# Patient Record
Sex: Female | Born: 1940 | Race: White | Hispanic: No | Smoking: Never smoker
Health system: Southern US, Community
[De-identification: ages and names within clinical notes are randomized; demographics above are authoritative.]

## PROBLEM LIST (undated history)

## (undated) DIAGNOSIS — M1990 Unspecified osteoarthritis, unspecified site: Secondary | ICD-10-CM

## (undated) DIAGNOSIS — E785 Hyperlipidemia, unspecified: Secondary | ICD-10-CM

## (undated) DIAGNOSIS — M5412 Radiculopathy, cervical region: Secondary | ICD-10-CM

## (undated) DIAGNOSIS — S329XXA Fracture of unspecified parts of lumbosacral spine and pelvis, initial encounter for closed fracture: Secondary | ICD-10-CM

## (undated) DIAGNOSIS — M8580 Other specified disorders of bone density and structure, unspecified site: Secondary | ICD-10-CM

## (undated) DIAGNOSIS — H409 Unspecified glaucoma: Secondary | ICD-10-CM

## (undated) DIAGNOSIS — R195 Other fecal abnormalities: Secondary | ICD-10-CM

## (undated) DIAGNOSIS — G20 Parkinson's disease: Secondary | ICD-10-CM

## (undated) DIAGNOSIS — E559 Vitamin D deficiency, unspecified: Secondary | ICD-10-CM

## (undated) DIAGNOSIS — R7303 Prediabetes: Secondary | ICD-10-CM

## (undated) DIAGNOSIS — Z5189 Encounter for other specified aftercare: Secondary | ICD-10-CM

## (undated) DIAGNOSIS — D649 Anemia, unspecified: Secondary | ICD-10-CM

## (undated) DIAGNOSIS — S5400XA Injury of ulnar nerve at forearm level, unspecified arm, initial encounter: Secondary | ICD-10-CM

## (undated) DIAGNOSIS — H269 Unspecified cataract: Secondary | ICD-10-CM

## (undated) DIAGNOSIS — G20A1 Parkinson's disease without dyskinesia, without mention of fluctuations: Secondary | ICD-10-CM

## (undated) DIAGNOSIS — I10 Essential (primary) hypertension: Secondary | ICD-10-CM

## (undated) DIAGNOSIS — T7840XA Allergy, unspecified, initial encounter: Secondary | ICD-10-CM

## (undated) HISTORY — DX: Unspecified cataract: H26.9

## (undated) HISTORY — DX: Hyperlipidemia, unspecified: E78.5

## (undated) HISTORY — DX: Vitamin D deficiency, unspecified: E55.9

## (undated) HISTORY — DX: Allergy, unspecified, initial encounter: T78.40XA

## (undated) HISTORY — DX: Prediabetes: R73.03

## (undated) HISTORY — DX: Encounter for other specified aftercare: Z51.89

## (undated) HISTORY — PX: DILATION AND CURETTAGE OF UTERUS: SHX78

## (undated) HISTORY — DX: Other specified disorders of bone density and structure, unspecified site: M85.80

## (undated) HISTORY — PX: TONSILLECTOMY: SUR1361

## (undated) HISTORY — DX: Other fecal abnormalities: R19.5

## (undated) HISTORY — DX: Radiculopathy, cervical region: M54.12

## (undated) HISTORY — DX: Unspecified glaucoma: H40.9

## (undated) HISTORY — PX: COLONOSCOPY: SHX174

---

## 1999-07-28 ENCOUNTER — Encounter: Admission: RE | Admit: 1999-07-28 | Discharge: 1999-07-28 | Payer: Self-pay | Admitting: Obstetrics and Gynecology

## 1999-07-28 ENCOUNTER — Encounter: Payer: Self-pay | Admitting: Obstetrics and Gynecology

## 1999-09-05 ENCOUNTER — Other Ambulatory Visit: Admission: RE | Admit: 1999-09-05 | Discharge: 1999-09-05 | Payer: Self-pay | Admitting: Obstetrics and Gynecology

## 2000-07-29 ENCOUNTER — Encounter: Payer: Self-pay | Admitting: Obstetrics and Gynecology

## 2000-07-29 ENCOUNTER — Encounter: Admission: RE | Admit: 2000-07-29 | Discharge: 2000-07-29 | Payer: Self-pay | Admitting: Obstetrics and Gynecology

## 2000-12-27 ENCOUNTER — Other Ambulatory Visit: Admission: RE | Admit: 2000-12-27 | Discharge: 2000-12-27 | Payer: Self-pay | Admitting: Obstetrics and Gynecology

## 2001-08-05 ENCOUNTER — Encounter: Admission: RE | Admit: 2001-08-05 | Discharge: 2001-08-05 | Payer: Self-pay | Admitting: Obstetrics and Gynecology

## 2001-08-05 ENCOUNTER — Encounter: Payer: Self-pay | Admitting: Obstetrics and Gynecology

## 2001-12-28 ENCOUNTER — Other Ambulatory Visit: Admission: RE | Admit: 2001-12-28 | Discharge: 2001-12-28 | Payer: Self-pay | Admitting: Obstetrics and Gynecology

## 2002-01-05 ENCOUNTER — Encounter: Admission: RE | Admit: 2002-01-05 | Discharge: 2002-01-05 | Payer: Self-pay | Admitting: Obstetrics and Gynecology

## 2002-01-05 ENCOUNTER — Encounter: Payer: Self-pay | Admitting: Obstetrics and Gynecology

## 2002-02-28 DIAGNOSIS — S5400XA Injury of ulnar nerve at forearm level, unspecified arm, initial encounter: Secondary | ICD-10-CM

## 2002-02-28 HISTORY — DX: Injury of ulnar nerve at forearm level, unspecified arm, initial encounter: S54.00XA

## 2002-02-28 HISTORY — PX: OTHER SURGICAL HISTORY: SHX169

## 2002-03-26 DIAGNOSIS — S329XXA Fracture of unspecified parts of lumbosacral spine and pelvis, initial encounter for closed fracture: Secondary | ICD-10-CM

## 2002-03-26 HISTORY — DX: Fracture of unspecified parts of lumbosacral spine and pelvis, initial encounter for closed fracture: S32.9XXA

## 2002-03-26 HISTORY — PX: OTHER SURGICAL HISTORY: SHX169

## 2002-08-07 ENCOUNTER — Encounter: Admission: RE | Admit: 2002-08-07 | Discharge: 2002-08-07 | Payer: Self-pay | Admitting: Internal Medicine

## 2002-08-07 ENCOUNTER — Encounter: Payer: Self-pay | Admitting: Internal Medicine

## 2002-08-31 HISTORY — PX: OTHER SURGICAL HISTORY: SHX169

## 2003-08-13 ENCOUNTER — Encounter: Admission: RE | Admit: 2003-08-13 | Discharge: 2003-08-13 | Payer: Self-pay | Admitting: Internal Medicine

## 2004-10-10 ENCOUNTER — Encounter: Admission: RE | Admit: 2004-10-10 | Discharge: 2004-10-10 | Payer: Self-pay | Admitting: Obstetrics and Gynecology

## 2004-11-17 ENCOUNTER — Encounter: Admission: RE | Admit: 2004-11-17 | Discharge: 2004-11-17 | Payer: Self-pay | Admitting: Internal Medicine

## 2005-11-19 ENCOUNTER — Encounter: Admission: RE | Admit: 2005-11-19 | Discharge: 2005-11-19 | Payer: Self-pay | Admitting: Internal Medicine

## 2006-07-16 ENCOUNTER — Encounter: Admission: RE | Admit: 2006-07-16 | Discharge: 2006-07-16 | Payer: Self-pay | Admitting: Internal Medicine

## 2006-08-31 HISTORY — PX: OTHER SURGICAL HISTORY: SHX169

## 2006-10-05 ENCOUNTER — Inpatient Hospital Stay (HOSPITAL_COMMUNITY): Admission: RE | Admit: 2006-10-05 | Discharge: 2006-10-07 | Payer: Self-pay | Admitting: Orthopedic Surgery

## 2006-12-06 ENCOUNTER — Encounter: Admission: RE | Admit: 2006-12-06 | Discharge: 2006-12-06 | Payer: Self-pay | Admitting: Internal Medicine

## 2007-07-25 ENCOUNTER — Ambulatory Visit: Payer: Self-pay | Admitting: Internal Medicine

## 2007-08-08 ENCOUNTER — Ambulatory Visit: Payer: Self-pay | Admitting: Internal Medicine

## 2007-12-07 ENCOUNTER — Encounter: Admission: RE | Admit: 2007-12-07 | Discharge: 2007-12-07 | Payer: Self-pay | Admitting: Internal Medicine

## 2008-05-17 ENCOUNTER — Encounter: Admission: RE | Admit: 2008-05-17 | Discharge: 2008-05-17 | Payer: Self-pay | Admitting: Orthopedic Surgery

## 2008-06-20 ENCOUNTER — Encounter: Admission: RE | Admit: 2008-06-20 | Discharge: 2008-06-20 | Payer: Self-pay | Admitting: Orthopedic Surgery

## 2008-12-10 ENCOUNTER — Encounter: Admission: RE | Admit: 2008-12-10 | Discharge: 2008-12-10 | Payer: Self-pay | Admitting: Obstetrics and Gynecology

## 2009-07-18 ENCOUNTER — Ambulatory Visit (HOSPITAL_COMMUNITY): Admission: RE | Admit: 2009-07-18 | Discharge: 2009-07-18 | Payer: Self-pay | Admitting: Internal Medicine

## 2010-01-22 ENCOUNTER — Encounter: Admission: RE | Admit: 2010-01-22 | Discharge: 2010-01-22 | Payer: Self-pay | Admitting: Obstetrics and Gynecology

## 2010-09-22 ENCOUNTER — Encounter: Payer: Self-pay | Admitting: Orthopedic Surgery
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
## 2011-01-16 NOTE — Op Note (Signed)
NAME:  Gwendolyn [name], Gwendolyn [name]           ACCOUNT NO.:  [id number]   MEDICAL RECORD NO.:  [id number]          PATIENT TYPE:  INP   LOCATION:  0002                         FACILITY:  [facility]   PHYSICIAN:  Madlyn Frankel. Charlann Boxer, M.D.  DATE OF BIRTH:  Mar 08, 1941   DATE OF PROCEDURE:  10/05/2006  DATE OF DISCHARGE:                               OPERATIVE REPORT   PREOPERATIVE DIAGNOSIS:  End-stage right hip osteoarthritis.   POSTOPERATIVE DIAGNOSIS FINDINGS:  Right hip osteoarthritis significant  involving medial aspect of joint with significant medial wall osteophyte  as well as eburnated bone on the superior lateral aspect of the joint  surface.   PROCEDURE:  Right total hip replacement.   COMPONENTS USED:  1. DePuy hip system with a size 50 pinnacle cup.  2. Two cancellous bone screws with 36 metal-on-metal liner.  3. A Tri-Lock standard stem, size 8.8 with a 36+1.5 ball.   SURGEON:  Charlann Boxer.   ASSISTANT:  Dwyane Luo, P.A.C.   ANESTHESIA:  Spinal plus MAC.   DRAINS:  None.   COMPLICATIONS:  None.   BLOOD LOSS:  400 mL.   INDICATIONS FOR PROCEDURE:  Gwendolyn [name] is a very pleasant 70-year-  old female who presented to the office for evaluation of right hip pain.  She had a significant amount of discomfort in the anterior aspect of her  thigh with ambulation and prolonged standing.  She was having a  decreased quality of life due to the discomfort she was experiencing.  She was unable to continue tolerating non-operative measures and thus  wished to discuss surgical intervention.  We reviewed the risks of  infection, dislocation, component failure, need for revision,  DVT, as  well as a discussion of bearing surfaces consent was obtained for  surgical procedure.   PROCEDURE IN DETAIL:  The patient was brought to the operative theater.  Once adequate anesthesia and preoperative antibiotics, 1 g of Ancef were  administered.  The patient was administered in the left lateral  decubitus  position with the right side up.   A lateral base incision was made for posterior approach to the hip.  The  iliotibial band and gluteus fascia was incised for posterior approach.  Short external rotators were identified and taken down separately from  the posterior capsule.  Then L-capsulotomy was created and posterior  leaflet saved for later anatomical repair as well as protection against  retractors for the sciatic nerve.   The hip was dislocated.  A neck osteotomy was made based off anatomic  landmarks.   Anatomically, her hip center appeared to be level with the tip of the  trochanter and thus the neck guide used for that position.   The femoral head was noted to have eburnated bone along the inferior  medial and superior lateral aspects, other than some flattening and  osteophyte formation.  The medial wall of the acetabulum appeared to be  significantly osteophytic, very little opening from the foveal tissue.   At this point attention was first directed to the femur.  Femoral  exposure was obtained routinely using the box osteotome.  I set my  anteversion of the femur about 20-25 degrees.  I hand reamed the canal  once and irrigated to prevent fat emboli.  I then began broaching with a  6.6 broach and then carried this all the way up to an 8.8 with good  initial fixation.  At this point, I went ahead and took the broach out  and packed the femur off with a sponge and tended to the acetabulum.  Acetabular exposed routinely, labrectomy carried out.  Reaming commenced  with a 43 reamer down to the medial wall, given a significant medial  wall osteophyte.  I then reamed up, with the curved reamers, to a 49  reamer with good bony bed preparation.  I chose a 50 mm cup.  Given the  shallow nature of her acetabulum, I felt that a pinnacle cup would be  better than an ASR cup, which did provide [place] with metal-on-metal bearing  surface.  The cup was then impacted and it was impacted at  about 40  degrees of abduction and 20 degrees of forward flexion.  Anatomically,  the anterior portion of the cup was beneath the anterior rim of the  pelvis and posteriorly the posterior rim was level with the ischium.  I  did feel this was in anatomic position.  Two cancellous screws were  placed with excellent bite securing the fixation further.  A trial liner  was then placed.   I then placed a trial stem, trial reduction was carried out.  Please  note that initially I placed a 7.7 trial.  I went ahead and impacted a 7-  7 broach a little bit further and was able to ream this down a couple  millimeters.   I then trialed with a 7.7 and actually was able to get the hip reduced.  I felt that there was a little bit of concern about it being too loose  with a 1.5 ball trial with a +5 ball.  The hip stability was excellent  with neutral abduction and hip flexion to 80 degrees and internal  rotation to 70-80 degrees as well as with adduction to 30 degrees and  internal rotation.  Compared to the down leg, the leg lengths appeared  to be very equal.  At this point, I dislocated the hip, attended to the  femur again and found that there was a little bit of torsional movement  with the 7.7, so I decided to go up to the 8.8.  The 8.8 broach was  carried down to just above the neck cut, which was okay with me given  the fact that I had used the +5 ball and trial.  At this point, the  trials were all removed.  The final 56 neutral metal liner was impacted  into the 50 mm cup.   At this point I went ahead and placed the 8.8 standard Tri-Lock stem to  the neck.  I had excellent torsional control. I went ahead and trialed  the +1.5 and felt that the hip was very stable and was similar to the  trial with the 7.7 and a +5 ball.   The femoral 36+1.5 mm ball was then impacted on to  a clean and dry trunnion and the hip reduced.  The hip was irrigated  throughout the  case, and again at this point.   I then reapproximated the posterior  capsular leaflet to the superior leaflet using #1 Ethibond.  The  iliotibial band was reapproximated using #1 Ethibond and #  1 Vicryl was  used on the gluteus fascia.  The remainder of the wound was closed in  layers with #2-0 Vicryl and a running #4-0 Monocryl.   The hip was cleaned, dried and dressed sterilely, following #4-0  Monocryl closure with Steri-Strips, dressings, sponges, tape.  The  patient was brought to the recovery room in stable condition.      Madlyn Frankel Charlann Boxer, M.D.  Electronically Signed     MDO/MEDQ  D:  10/05/2006  T:  10/05/2006  Job:  [id number]

## 2011-02-04 ENCOUNTER — Other Ambulatory Visit: Payer: Self-pay | Admitting: Obstetrics and Gynecology

## 2011-02-04 DIAGNOSIS — Z1231 Encounter for screening mammogram for malignant neoplasm of breast: Secondary | ICD-10-CM

## 2011-02-16 ENCOUNTER — Ambulatory Visit: Payer: Self-pay

## 2011-02-23 ENCOUNTER — Ambulatory Visit
Admission: RE | Admit: 2011-02-23 | Discharge: 2011-02-23 | Disposition: A | Discharge status: Home / Self Care | Payer: BC Managed Care – PPO | Source: Ambulatory Visit | Attending: Obstetrics and Gynecology | Admitting: Obstetrics and Gynecology

## 2011-02-23 DIAGNOSIS — Z1231 Encounter for screening mammogram for malignant neoplasm of breast: Secondary | ICD-10-CM

## 2011-05-02 HISTORY — PX: OTHER SURGICAL HISTORY: SHX169

## 2011-05-21 ENCOUNTER — Other Ambulatory Visit: Payer: Self-pay | Admitting: Orthopedic Surgery

## 2011-05-21 ENCOUNTER — Encounter (HOSPITAL_COMMUNITY): Payer: BC Managed Care – PPO

## 2011-05-21 LAB — COMPREHENSIVE METABOLIC PANEL
ALT: 32 U/L (ref 0–35)
AST: 33 U/L (ref 0–37)
Albumin: 3.7 g/dL (ref 3.5–5.2)
Alkaline Phosphatase: 100 U/L (ref 39–117)
BUN: 33 mg/dL — ABNORMAL HIGH (ref 6–23)
CO2: 30 mEq/L (ref 19–32)
Calcium: 9.5 mg/dL (ref 8.4–10.5)
Chloride: 97 mEq/L (ref 96–112)
Creatinine, Ser: 0.78 mg/dL (ref 0.50–1.10)
GFR calc Af Amer: >60 mL/min (ref >60)
GFR calc non Af Amer: >60 mL/min (ref >60)
Glucose, Bld: 89 mg/dL (ref 70–99)
Potassium: 3.3 mEq/L — ABNORMAL LOW (ref 3.5–5.1)
Sodium: 137 mEq/L (ref 135–145)
Total Bilirubin: 0.2 mg/dL — ABNORMAL LOW (ref 0.3–1.2)
Total Protein: 7.4 g/dL (ref 6.0–8.3)

## 2011-05-21 LAB — URINALYSIS, ROUTINE W REFLEX MICROSCOPIC
Bilirubin Urine: NEGATIVE
Glucose, UA: NEGATIVE mg/dL
Hgb urine dipstick: NEGATIVE
Ketones, ur: NEGATIVE mg/dL
Nitrite: NEGATIVE
Protein, ur: NEGATIVE mg/dL
Specific Gravity, Urine: 1.02 (ref 1.005–1.030)
Urobilinogen, UA: 0.2 mg/dL (ref 0.0–1.0)
pH: 7 (ref 5.0–8.0)

## 2011-05-21 LAB — CBC
HCT: 37.7 % (ref 36.0–46.0)
Hemoglobin: 12.4 g/dL (ref 12.0–15.0)
MCH: 28.4 pg (ref 26.0–34.0)
MCHC: 32.9 g/dL (ref 30.0–36.0)
MCV: 86.3 fL (ref 78.0–100.0)
Platelets: 289 10*3/uL (ref 150–400)
RBC: 4.37 MIL/uL (ref 3.87–5.11)
RDW: 14.9 % (ref 11.5–15.5)
WBC: 6.1 10*3/uL (ref 4.0–10.5)

## 2011-05-21 LAB — DIFFERENTIAL
Basophils Absolute: 0 10*3/uL (ref 0.0–0.1)
Basophils Relative: 1 % (ref 0–1)
Eosinophils Absolute: 0.2 10*3/uL (ref 0.0–0.7)
Eosinophils Relative: 3 % (ref 0–5)
Lymphocytes Relative: 30 % (ref 12–46)
Lymphs Abs: 1.8 10*3/uL (ref 0.7–4.0)
Monocytes Absolute: 0.8 10*3/uL (ref 0.1–1.0)
Monocytes Relative: 13 % — ABNORMAL HIGH (ref 3–12)
Neutro Abs: 3.3 10*3/uL (ref 1.7–7.7)
Neutrophils Relative %: 54 % (ref 43–77)

## 2011-05-21 LAB — URINE MICROSCOPIC-ADD ON

## 2011-05-21 LAB — PROTIME-INR
INR: 1.02 (ref 0.00–1.49)
Prothrombin Time: 13.6 seconds (ref 11.6–15.2)

## 2011-05-21 LAB — APTT: aPTT: 31 seconds (ref 24–37)
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
## 2011-05-24 LAB — MRSA CULTURE

## 2011-05-26 ENCOUNTER — Inpatient Hospital Stay (HOSPITAL_COMMUNITY)
Admission: RE | Admit: 2011-05-26 | Discharge: 2011-05-28 | DRG: 209 | Disposition: A | Discharge status: Home Health | Payer: BC Managed Care – PPO | Source: Ambulatory Visit | Attending: Orthopedic Surgery | Admitting: Orthopedic Surgery

## 2011-05-26 DIAGNOSIS — Z79899 Other long term (current) drug therapy: Secondary | ICD-10-CM

## 2011-05-26 DIAGNOSIS — M5412 Radiculopathy, cervical region: Secondary | ICD-10-CM | POA: Diagnosis present

## 2011-05-26 DIAGNOSIS — M1710 Unilateral primary osteoarthritis, unspecified knee: Principal | ICD-10-CM | POA: Diagnosis present

## 2011-05-26 DIAGNOSIS — Z01812 Encounter for preprocedural laboratory examination: Secondary | ICD-10-CM

## 2011-05-26 DIAGNOSIS — Z7982 Long term (current) use of aspirin: Secondary | ICD-10-CM

## 2011-05-26 DIAGNOSIS — Z79890 Hormone replacement therapy (postmenopausal): Secondary | ICD-10-CM

## 2011-05-26 DIAGNOSIS — M21069 Valgus deformity, not elsewhere classified, unspecified knee: Secondary | ICD-10-CM | POA: Diagnosis present

## 2011-05-26 DIAGNOSIS — I10 Essential (primary) hypertension: Secondary | ICD-10-CM | POA: Diagnosis present

## 2011-05-26 DIAGNOSIS — Z96649 Presence of unspecified artificial hip joint: Secondary | ICD-10-CM

## 2011-05-26 LAB — TYPE AND SCREEN: Antibody Screen: NEGATIVE

## 2011-05-27 LAB — BASIC METABOLIC PANEL
BUN: 20 mg/dL (ref 6–23)
Calcium: 8 mg/dL — ABNORMAL LOW (ref 8.4–10.5)
Creatinine, Ser: 0.81 mg/dL (ref 0.50–1.10)
GFR calc Af Amer: >60 mL/min (ref >60)
Glucose, Bld: 111 mg/dL — ABNORMAL HIGH (ref 70–99)

## 2011-05-27 LAB — CBC
Hemoglobin: 10.7 g/dL — ABNORMAL LOW (ref 12.0–15.0)
Platelets: 211 10*3/uL (ref 150–400)
RDW: 15.2 % (ref 11.5–15.5)

## 2011-05-28 LAB — BASIC METABOLIC PANEL
Calcium: 8.4 mg/dL (ref 8.4–10.5)
GFR calc Af Amer: >60 mL/min (ref >60)
GFR calc non Af Amer: >60 mL/min (ref >60)
Glucose, Bld: 96 mg/dL (ref 70–99)
Potassium: 3.9 mEq/L (ref 3.5–5.1)

## 2011-05-28 LAB — CBC
HCT: 33.3 % — ABNORMAL LOW (ref 36.0–46.0)
RBC: 3.84 MIL/uL — ABNORMAL LOW (ref 3.87–5.11)
WBC: 8 10*3/uL (ref 4.0–10.5)

## 2011-06-01 NOTE — Op Note (Signed)
Gwendolyn, [name] NO.:  [id number]  MEDICAL RECORD NO.:  [id number]  LOCATION:  1610                         FACILITY:  [facility]  PHYSICIAN:  Madlyn Frankel. Charlann Boxer, M.D.  DATE OF BIRTH:  [birth year]  DATE OF PROCEDURE:  05/26/2011 DATE OF DISCHARGE:                              OPERATIVE REPORT   PREOPERATIVE DIAGNOSIS:  Right knee osteoarthritis.  POSTOPERATIVE DIAGNOSIS:  Right knee osteoarthritis.  PROCEDURE:  Right total knee replacement utilizing DePuy component, size 2.5 femur, 2 tibia, 10-mm rotating platform posterior-stabilized insert, and a 35 patellar button.  SURGEON:  Madlyn Frankel. Charlann Boxer, M.D.  ASSISTANT:  [name] Chabon, P.A.C.  ANESTHESIA:  Spinal.  SPECIMENS:  None.  COMPLICATIONS:  None.  DRAINS:  One Hemovac.  TOURNIQUET TIME:  32 minutes, 250 mmHg.  INDICATIONS FOR PROCEDURE:  Gwendolyn [name] is a pleasant 70 year old patient of mine.  She had presented and has been treated for right knee arthritis including arthroscopic injections and further medications. She has had progressive valgus deformity, increasing pain, failing conservative measures wishing to have improved quality of life.  Risks and benefits of knee replacement surgery discussed including the postoperative course and expectation including infection, DVT, component failure, and need for revision surgery.  PROCEDURE IN DETAIL:  The patient was brought to the operative theater. Once adequate anesthesia, preoperative antibiotics, Ancef administered, she was positioned supine with a right thigh tourniquet placed.  The right lower extremity was then prepped and draped in a sterile fashion with the right foot placed into Mayo leg holder.  Time-out was performed identifying the patient, planned procedure and the extremity.  The leg was exsanguinated, tourniquet elevated to 250 mmHg.  Midline incision was made followed by median parapatellar arthrotomy. Limited exposure on  the proximal medial aspect of the tibia based on her valgus deformity,  I did work on exposure on the lateral side with proximal lateral leads of the iliotibial band and lateral structures about a centimeter down for native tibia.  Attention was first directed to the patella.  Precut measurement was noted to be about 21-mm.  I resected down to about 13-14 mm using 35 patellar button to restore patellar height.  Lug holes were drilled and a metal shim was placed to protect the patella from retractors and saw blades.  Attention was now directed to the femur.  The femoral canal was opened with a drill, irrigated to prevent fat emboli.  An intramedullary rod was passed and at 3 degrees of valgus, 10-mm of bone was resected off the distal femur.  This amounted to approximately 2-3 mm cut on lateral distal femur with regular standard cut medially.  At this point, the tibia was subluxated anteriorly using extramedullary guide and measured resection of 4-mm of the proximal lateral tibia was carried out.  I then confirmed that the gap would be stable with 10-mm insert in extension.  At this point, I confirmed also it was appropriate to cut on the tibia perpendicular in coronal plane using alignment rod.  The femur then was sized.  It was determined be a size 2.5 femur.  The size 2.5 rotation block was pinned in position, anterior reference using the C clamp to set rotation  off the proximal tibia cut.  The 4-in-1 cutting block was pinned in position.  Anterior, posterior and chamfer cuts were made followed by the box cut off the lateral aspect of the distal femur.  The tibia was subluxated anteriorly.  I used the size 2 tibial tray to fit best on my cut surface, it was pinned into position through the medial third of tubercle.  It was drilled and keel punched.  A trial reduction was now carried out noting with a 2.5 femur, 2 tibia, and a 10- mm insert.  With this, the knee felt very nice  and stable from extension and flexion.  No significant plane medially or laterally.  The patella tracked through the trochlea with application of pressure.  At this point, trial components removed.  The synovial capsule junction in the knee was injected with 0.25% Marcaine with epinephrine and 1 cc of Toradol.  Final components were opened and cement was mixed.  The knee was irrigated with normal saline solution pulsed lavage.  Final components were then cemented on the clean and dried cut surface of the bone using a 10-mm insert.  The knee was brought to extension. Extruded cement was removed.  Tourniquet was let down at 33 minutes. Once the cement had fully cured and excessive cement removed throughout the knee, the final 10-mm insert was chosen and placed into the knee.  At this point, the knee was re-irrigated with normal saline solution.  A medium Hemovac drain was placed deep.  The extensor mechanism was reapproximated using #1 Vicryl with the knee in flexion.  The remainder of wound was closed with 2-0 Vicryl and a running 4-0 Monocryl.  The knee was cleaned, dried, and dressed sterilely using Dermabond and Aquacel dressing.  Drain site dressed separately.  She was then brought to recovery room with the Ace wrap applied to the knee.  Please note findings that the patient had bone-on-bone arthritis in the lateral and patellofemoral parts of her joint, thus justifying the procedure.  Please note also that Gwendolyn Able, PA-C was utilized for the entire portion of the case from preoperative position, perioperative retractor management, and general facilitation of the case.  He is also involved with direct wound closure.     Madlyn Frankel Charlann Boxer, M.D.     MDO/MEDQ  D:  05/26/2011  T:  05/26/2011  Job:  [id number]  Electronically Signed by Durene Romans M.D. on 06/01/2011 09:05:02 AM

## 2011-06-01 NOTE — Discharge Summary (Signed)
NAMETRANISE, FORREST NO.:  [id number]  MEDICAL RECORD NO.:  [id number]  LOCATION:  1610                         FACILITY:  [facility]  PHYSICIAN:  Madlyn Frankel. Charlann Boxer, M.D.  DATE OF BIRTH:  04/13/1941  DATE OF ADMISSION:  05/26/2011 DATE OF DISCHARGE:  05/28/2011                              DISCHARGE SUMMARY   PROCEDURE:  Right total knee arthroplasty.  ATTENDING PHYSICIAN:  Madlyn Frankel. Charlann Boxer, MD  ADMITTING DIAGNOSIS:  Right knee osteoarthritis.  DISCHARGE DIAGNOSES: 1. 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 3. Hypertension.  HISTORY OF PRESENT ILLNESS:  The patient is a 70 year old lady with a history of osteoarthritis of her right knee with increasing valgus deformity, pain, and discomfort, despite a knee arthroscopy.  The patient has also had other conservative treatments including injections, which have been ineffective in treating her symptoms.  X-rays in the clinic do show osteoarthritic changes within the right knee.  Options were discussed with the patient.  The patient wished to proceed with surgery.  Risks, benefits, and expectations of procedure discussed with the patient.  The patient understands risks, benefits, and expectations and wished to proceed with surgery.  HOSPITAL COURSE:  The patient underwent the above-stated procedure on May 26, 2011.  The patient tolerated the procedure well, was brought to the recovery room in good condition and subsequently to the floor.  Postop day #1, May 27, 2011, the patient doing well.  No events. Pain is well controlled, afebrile, vital signs stable.  Hemoglobin and hematocrit 10.7/32.1.  Dressings are good, clean, dry, and intact.  She is additionally neurovascularly intact.  Hemovac drain was removed as well as IV was changed to saline lock.  The patient had physical therapy.  Postop day #2, May 28, 2011.  The patient doing well.  No events. Pain is  well controlled, afebrile, vital signs stable.  Hemoglobin and hematocrit 10.9/33.3.  Dressing clean, dry, and intact.  She is distally neurovascularly intact.  The Ace was discontinued.  IV was discontinued. The patient was felt to be doing well enough to be discharged home with home health PT.  DISCHARGE CONDITION:  Good.  DISCHARGE INSTRUCTIONS:  The patient will be discharged home with home health PT.  The patient will be weightbearing as tolerated.  The patient to maintain her surgical dressing for about 8 days after which time she will replace with gauze and tape.  The patient should keep the area dry and clean until followup.  The patient to follow up in 2 weeks with [facility].  The patient knows to call with any questions or concerns.  DISCHARGE MEDICATIONS: 1. Aspirin enteric-coated 325 mg 1 p.o. b.i.d. for 4 weeks. 2. Benadryl 25 mg 1 p.o.  q.4 h. p.r.n. 3. Colace 100 mg 1 p.o. b.i.d., constipation. 4. Iron sulfate 325 mg 1 p.o. t.i.d. for 2 to 3 weeks. 5. Norco 7.5/325 one to two p.o. q.4-6 hours p.r.n. pain. 6. Robaxin 500 mg 1 p.o. q.6 h. p.r.n. muscle spasms. 7. MiraLax 17 g 1 p.o. daily, constipation. 8. Calcium 600 mg 1 p.o. b.i.d. 9. Celebrex 200 mg one p.o. q.a.m. 10.Esterase 1 mg 1/2 tablet p.o. q.h.s. 11.Fish oil 500  mg 1 p.o. b.i.d. 12.Lasix 80 mg 1/2 p.o. q.a.m. p.r.n. 13.Lyrica 100 mg 2 p.o. q.h.s. 14.Magnesium 250 mg 1 p.o. daily. 15.Provera 2.5 mg 1/2 p.o. q.h.s. 16.Uniretic 15/25 mg 1/2 tablet p.o. q.a.m. 17.Xanax 0.5 mg half p.o. q.h.s. p.r.n., insomnia.    ______________________________ Lanney Gins, PA   ______________________________ Madlyn Frankel. Charlann Boxer, M.D.    MB/MEDQ  D:  05/28/2011  T:  05/28/2011  Job:  [id number]  Electronically Signed by Lanney Gins PA on 05/30/2011 02:41:42 AM Electronically Signed by Durene Romans M.D. on 06/01/2011 09:05:49 AM

## 2011-10-13 ENCOUNTER — Encounter (HOSPITAL_COMMUNITY): Payer: Self-pay | Admitting: Pharmacy Technician

## 2011-10-14 NOTE — Progress Notes (Signed)
H&P performed 10/14/11 Dictation # [id number]  

## 2011-10-16 NOTE — H&P (Signed)
NAMEANNEL, ZUNKER NO.:  [id number]  MEDICAL RECORD NO.:  [id number]  LOCATION:                               FACILITY:  [facility]  PHYSICIAN:  Madlyn Frankel. Charlann Boxer, M.D.  DATE OF BIRTH:  05/02/1941  DATE OF ADMISSION:  10/22/2011 DATE OF DISCHARGE:                             HISTORY & PHYSICAL   DATE OF SURGERY:  October 22, 2011.  ADMITTING DIAGNOSES: 1. 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 2. Osteoarthritis, left hip.  HISTORY OF PRESENT ILLNESS:  This is a 71 year old lady with a history of a previous metal-on-metal total hip arthroplasty on the right with early wear and suboptimal position of acetabular component.  This is giving her some difficulties; and with the concern of metal-on-metal wear while being in the hospital for her left anterior total hip arthroplasty, discussion was had and decision was made to go ahead and change the acetabular component and femoral head to a ceramic-on- plastic.  In addition, she has advanced osteoarthritis of her left hip and at this time, after failure of conservative treatment, she is scheduled for total hip arthroplasty of the left hip by anterior approach.  The surgery risks, benefits, aftercare were discussed in detail with the patient, questions invited and answered.  Due to the fact that she is having both lower extremities worked on at the same time, we will put her on Xarelto in the hospital and also for 12 days after discharge and then aspirin 325 one p.o. b.i.d. for another 4 weeks as DVT prophylaxis.  The surgery risks, benefits, and aftercare were discussed in detail with the patient, questions invited and answered. Also note that the patient is a candidate for tranexamic acid and will receive that at preop.  Also note that her medical doctor is Dr. Lucky Cowboy and her gynecologist is Dr. Tracey Harries.  She plans on going home after the surgery.  PAST MEDICAL HISTORY:  Drug  allergies to IBUPROFEN with hives, MACRODANTIN with a rash, and PERCOCET with panic attacks.  Serious medical illnesses include hypertension, ulnar neuritis of the right arm, and osteopenia.  Previous surgeries include right total hip arthroplasty; right wrist, elbow, humerus, ulnar, and radius surgery from trauma with ORIF; and total knee arthroplasty, right knee.  CURRENT MEDICATIONS:  Include; 1. Lyrica 100 mg 2 tablets at bedtime. 2. Uniretic 15/25 one-half tablet daily. 3. Provera 2.5 mg 1/2 tablet at bedtime. 4. Estrace 1 mg 1/2 tablet at bedtime. 5. Red yeast rice 600 mg 2 tablets b.i.d. 6. Aspirin 81 mg nightly. 7. Calcium 600 mg b.i.d. 8. Fish oil 1200 mg b.i.d. 9. Vitamin B6 of 200 mg daily. 10.Vitamin D of 1000 units b.i.d. 11.Lasix 80 mg 1/2 tablet p.r.n., swelling. 12.Xanax 0.5 mg 1/2 tablet p.r.n. 13.Vicodin 5/325 one q.6 p.r.n.  FAMILY HISTORY:  Positive for heart attack, coronary artery disease, and kidney failure.  SOCIAL HISTORY:  The patient is divorced.  She lives at home.  She is an Consulting civil engineer.  She drinks socially and does not smoke, and plans to go home after surgery.  REVIEW OF SYSTEMS:  CENTRAL NERVOUS SYSTEM:  Negative for headache, blurred vision, or dizziness.  PULMONARY:  Negative for  shortness of breath, PND, or orthopnea.  CARDIOVASCULAR:  Negative for chest pain or palpitation.  GI:  Negative for ulcers or hepatitis.  GU:  Negative for urinary tract difficulty.  MUSCULOSKELETAL:  Positive in HPI.  PHYSICAL EXAMINATION:  GENERAL:  This is a well-developed, well- nourished lady in no acute distress. VITAL SIGNS:  BP, 140/81, pulse 80 and regular, respirations 14. HEENT:  Head normocephalic.  Nose patent.  Ears patent.  Pupils are equal, round, and reactive to light.  Throat without injection. NECK:  Supple without adenopathy.  Carotids 2+ without bruit.  CHEST: Clear auscultation.  No rales or rhonchi.  Respirations 14.  HEART: Regular rate  and rhythm at 80 beats per minute without murmur. ABDOMEN:  Soft.  Active bowel sounds.  No masses or organomegaly. NEUROLOGIC:  The patient is alert and oriented to time, place, and person.  Cranial nerves II-XII grossly intact. EXTREMITIES:  Show the right hip status post total hip arthroplasty with full extension, further flexion to 115 degrees, with a 40-degree arc of rotation.  The right total knee is well healed.  She has full extension, further flexion to 120 degrees.  Excellent stability.  No redness, swelling, or warmth.  The left hip shows full extension, flexion to 115 degrees with external rotation of 25, and internal rotation of 15. Neurovascular status is intact.  ASSESSMENT: 1. Status post total hip arthroplasty with metal-on-metal, right hip. 2. Osteoarthritis, left hip.  PLAN:  Right hip acetabular revision with ceramic femoral head and plastic liner, and total hip arthroplasty of the left hip by anterior approach.     [name] Desere Gwin, [name]   ______________________________ Madlyn Frankel Charlann Boxer, M.D.    SJC/MEDQ  D:  10/14/2011  T:  10/15/2011  Job:  [id number]

## 2011-10-16 NOTE — H&P (Signed)
NAMEJACKILYN, Gwendolyn [name] NO.:  [id number]  MEDICAL RECORD NO.:  [id number]  LOCATION:                               FACILITY:  [facility]  PHYSICIAN:  Madlyn Frankel. Charlann Boxer, M.D.  DATE OF BIRTH:  08-Oct-1940  DATE OF ADMISSION:  10/22/2011 DATE OF DISCHARGE:                             HISTORY & PHYSICAL   ADDENDUM:  Note that she was given her postop medications of Xarelto 10 mg 1 daily for 10 days, then to switch to aspirin 325 mg 1 b.i.d. for another 4 weeks; Robaxin; iron; MiraLax; and Colace.     [name], [name]   ______________________________ Madlyn Frankel Charlann Boxer, M.D.    SJC/MEDQ  D:  10/14/2011  T:  10/15/2011  Job:  [id number]

## 2011-10-19 ENCOUNTER — Encounter (HOSPITAL_COMMUNITY)
Admission: RE | Admit: 2011-10-19 | Discharge: 2011-10-19 | Disposition: A | Discharge status: Home / Self Care | Payer: BC Managed Care – PPO | Source: Ambulatory Visit | Attending: Orthopedic Surgery | Admitting: Orthopedic Surgery

## 2011-10-19 ENCOUNTER — Encounter (HOSPITAL_COMMUNITY): Payer: Self-pay

## 2011-10-19 ENCOUNTER — Ambulatory Visit (HOSPITAL_COMMUNITY)
Admission: RE | Admit: 2011-10-19 | Discharge: 2011-10-19 | Disposition: A | Discharge status: Home / Self Care | Payer: BC Managed Care – PPO | Source: Ambulatory Visit | Attending: Orthopedic Surgery | Admitting: Orthopedic Surgery

## 2011-10-19 DIAGNOSIS — Z01812 Encounter for preprocedural laboratory examination: Secondary | ICD-10-CM | POA: Insufficient documentation

## 2011-10-19 DIAGNOSIS — Z01818 Encounter for other preprocedural examination: Secondary | ICD-10-CM | POA: Insufficient documentation

## 2011-10-19 HISTORY — DX: Injury of ulnar nerve at forearm level, unspecified arm, initial encounter: S54.00XA

## 2011-10-19 HISTORY — DX: Fracture of unspecified parts of lumbosacral spine and pelvis, initial encounter for closed fracture: S32.9XXA

## 2011-10-19 HISTORY — DX: Essential (primary) hypertension: I10

## 2011-10-19 HISTORY — DX: Unspecified osteoarthritis, unspecified site: M19.90

## 2011-10-19 HISTORY — DX: Anemia, unspecified: D64.9

## 2011-10-19 LAB — SURGICAL PCR SCREEN: Staphylococcus aureus: POSITIVE — AB

## 2011-10-19 LAB — URINALYSIS, ROUTINE W REFLEX MICROSCOPIC
Ketones, ur: NEGATIVE mg/dL
Leukocytes, UA: NEGATIVE
Nitrite: NEGATIVE
Specific Gravity, Urine: 1.021 (ref 1.005–1.030)
Urobilinogen, UA: 0.2 mg/dL (ref 0.0–1.0)
pH: 7 (ref 5.0–8.0)

## 2011-10-19 LAB — COMPREHENSIVE METABOLIC PANEL
Albumin: 3.7 g/dL (ref 3.5–5.2)
BUN: 30 mg/dL — ABNORMAL HIGH (ref 6–23)
Calcium: 9.4 mg/dL (ref 8.4–10.5)
Creatinine, Ser: 0.87 mg/dL (ref 0.50–1.10)
Total Protein: 7.6 g/dL (ref 6.0–8.3)

## 2011-10-19 LAB — DIFFERENTIAL
Lymphs Abs: 2.1 10*3/uL (ref 0.7–4.0)
Monocytes Relative: 8 % (ref 3–12)
Neutro Abs: 3.2 10*3/uL (ref 1.7–7.7)
Neutrophils Relative %: 53 % (ref 43–77)

## 2011-10-19 LAB — PROTIME-INR: INR: 1.04 (ref 0.00–1.49)

## 2011-10-19 LAB — CBC
HCT: 37.6 % (ref 36.0–46.0)
MCHC: 32.7 g/dL (ref 30.0–36.0)
MCV: 83.6 fL (ref 78.0–100.0)
RDW: 15 % (ref 11.5–15.5)

## 2011-10-19 LAB — APTT: aPTT: 32 seconds (ref 24–37)

## 2011-10-19 NOTE — Pre-Procedure Instructions (Signed)
ekg 01-21-11 dr Madilyn Fireman on chart Medical clearance note dr Madilyn Fireman on chart

## 2011-10-19 NOTE — Patient Instructions (Addendum)
20 Genelle Economou Montuori  10/19/2011   Your procedure is scheduled on: 2-21-12013  Report to [facility] at  0515 AM.  Call this number if you have problems the morning of surgery: [phone]   Remember:   Do not eat food or drink liquids:After Midnight.  Take these medicines the morning of surgery with A SIP OF WATER: hydrocodone if needed   Do not wear jewelry...  Do not wear lotions, powders, or perfumes. Do not wear deodorant.  .  Do not bring valuables to the hospital.  Contacts, dentures or bridgework may not be worn into surgery.  Leave suitcase in the car. After surgery it may be brought to your room.  For patients admitted to the hospital, checkout time is 11:00 AM the day of discharge.     Special Instructions: CHG Shower Use Special Wash: 1/2 bottle night before surgery and 1/2 bottle morning of surgery.neck down avoid private area, do not shave for 2 days before   Please read over the following fact sheets that you were given: MRSA Information, blood fact sheet  Jasmine December Ansley Mangiapane rn [facility] pre op nurse phone number [phone] call if needed

## 2011-10-21 ENCOUNTER — Encounter (HOSPITAL_COMMUNITY): Payer: Self-pay | Admitting: Anesthesiology

## 2011-10-21 MED ORDER — TRANEXAMIC ACID 100 MG/ML IV SOLN
15.0000 mg/kg | Freq: Once | INTRAVENOUS | Status: AC
  Administered 2011-10-22: 1210.5 mg via INTRAVENOUS
  Filled 2011-10-21 (×2): qty 12.11

## 2011-10-21 NOTE — Anesthesia Preprocedure Evaluation (Signed)
Anesthesia Evaluation  Patient identified by MRN, date of birth, ID band Patient awake    Reviewed: Allergy & Precautions, H&P , NPO status , Patient's Chart, lab work & pertinent test results  Airway Mallampati: II TM Distance: >3 FB Neck ROM: Full    Dental No notable dental hx.    Pulmonary neg pulmonary ROS,  clear to auscultation  Pulmonary exam normal       Cardiovascular hypertension, Pt. on medications Regular Normal    Neuro/Psych Negative Neurological ROS  Negative Psych ROS   GI/Hepatic negative GI ROS, Neg liver ROS,   Endo/Other  Negative Endocrine ROS  Renal/GU negative Renal ROS  Genitourinary negative   Musculoskeletal negative musculoskeletal ROS (+)   Abdominal (+) obese,   Peds negative pediatric ROS (+)  Hematology negative hematology ROS (+)   Anesthesia Other Findings   Reproductive/Obstetrics negative OB ROS                           Anesthesia Physical Anesthesia Plan  ASA: II  Anesthesia Plan: General   Post-op Pain Management:    Induction: Intravenous  Airway Management Planned: Oral ETT  Additional Equipment:   Intra-op Plan:   Post-operative Plan: Extubation in OR  Informed Consent: I have reviewed the patients History and Physical, chart, labs and discussed the procedure including the risks, benefits and alternatives for the proposed anesthesia with the patient or authorized representative who has indicated his/her understanding and acceptance.   Dental advisory given  Plan Discussed with: CRNA  Anesthesia Plan Comments:         Anesthesia Quick Evaluation

## 2011-10-22 ENCOUNTER — Inpatient Hospital Stay (HOSPITAL_COMMUNITY): Payer: BC Managed Care – PPO

## 2011-10-22 ENCOUNTER — Encounter (HOSPITAL_COMMUNITY)
Admission: RE | Disposition: A | Discharge status: Home Health | Payer: Self-pay | Source: Ambulatory Visit | Attending: Orthopedic Surgery

## 2011-10-22 ENCOUNTER — Encounter (HOSPITAL_COMMUNITY): Payer: Self-pay | Admitting: *Deleted

## 2011-10-22 ENCOUNTER — Inpatient Hospital Stay (HOSPITAL_COMMUNITY): Payer: BC Managed Care – PPO | Admitting: Anesthesiology

## 2011-10-22 ENCOUNTER — Encounter (HOSPITAL_COMMUNITY): Payer: Self-pay | Admitting: Anesthesiology

## 2011-10-22 ENCOUNTER — Inpatient Hospital Stay (HOSPITAL_COMMUNITY)
Admission: RE | Admit: 2011-10-22 | Discharge: 2011-10-24 | DRG: 817 | Disposition: A | Discharge status: Home Health | Payer: BC Managed Care – PPO | Source: Ambulatory Visit | Attending: Orthopedic Surgery | Admitting: Orthopedic Surgery

## 2011-10-22 DIAGNOSIS — R7989 Other specified abnormal findings of blood chemistry: Secondary | ICD-10-CM | POA: Diagnosis present

## 2011-10-22 DIAGNOSIS — Y831 Surgical operation with implant of artificial internal device as the cause of abnormal reaction of the patient, or of later complication, without mention of misadventure at the time of the procedure: Secondary | ICD-10-CM | POA: Diagnosis present

## 2011-10-22 DIAGNOSIS — Z96649 Presence of unspecified artificial hip joint: Secondary | ICD-10-CM

## 2011-10-22 DIAGNOSIS — E669 Obesity, unspecified: Secondary | ICD-10-CM | POA: Diagnosis present

## 2011-10-22 DIAGNOSIS — M169 Osteoarthritis of hip, unspecified: Secondary | ICD-10-CM | POA: Diagnosis present

## 2011-10-22 DIAGNOSIS — M1610 Unilateral primary osteoarthritis, unspecified hip: Secondary | ICD-10-CM | POA: Diagnosis present

## 2011-10-22 DIAGNOSIS — I10 Essential (primary) hypertension: Secondary | ICD-10-CM | POA: Diagnosis present

## 2011-10-22 DIAGNOSIS — T84099A Other mechanical complication of unspecified internal joint prosthesis, initial encounter: Principal | ICD-10-CM | POA: Diagnosis present

## 2011-10-22 HISTORY — PX: TOTAL HIP ARTHROPLASTY: SHX124

## 2011-10-22 LAB — TYPE AND SCREEN
ABO/RH(D): A POS
Antibody Screen: NEGATIVE

## 2011-10-22 SURGERY — REVISION, TOTAL ARTHROPLASTY, HIP, ACETABULAR COMPONENT
Anesthesia: General | Site: Hip | Laterality: Right

## 2011-10-22 MED ORDER — METHOCARBAMOL 100 MG/ML IJ SOLN
500.0000 mg | Freq: Four times a day (QID) | INTRAVENOUS | Status: DC | PRN
  Administered 2011-10-22 (×2): 500 mg via INTRAVENOUS
  Filled 2011-10-22 (×2): qty 5

## 2011-10-22 MED ORDER — MIDAZOLAM HCL 5 MG/5ML IJ SOLN
INTRAMUSCULAR | Status: DC | PRN
  Administered 2011-10-22: 1 mg via INTRAVENOUS

## 2011-10-22 MED ORDER — NEOSTIGMINE METHYLSULFATE 1 MG/ML IJ SOLN
INTRAMUSCULAR | Status: DC | PRN
  Administered 2011-10-22: 4.5 mg via INTRAVENOUS

## 2011-10-22 MED ORDER — ONDANSETRON HCL 4 MG PO TABS: 4.0000 mg | ORAL_TABLET | Freq: Four times a day (QID) | ORAL | Status: DC | PRN

## 2011-10-22 MED ORDER — PHENOL 1.4 % MT LIQD: 1.0000 | OROMUCOSAL | Status: DC | PRN

## 2011-10-22 MED ORDER — CEFAZOLIN SODIUM-DEXTROSE 2-3 GM-% IV SOLR
2.0000 g | INTRAVENOUS | Status: AC
  Administered 2011-10-22: 2 g via INTRAVENOUS

## 2011-10-22 MED ORDER — CALCIUM CARBONATE 600 MG PO TABS: 600.0000 mg | ORAL_TABLET | Freq: Two times a day (BID) | ORAL | Status: DC

## 2011-10-22 MED ORDER — POLYETHYLENE GLYCOL 3350 17 G PO PACK
17.0000 g | PACK | Freq: Two times a day (BID) | ORAL | Status: DC
  Administered 2011-10-22 – 2011-10-24 (×4): 17 g via ORAL
  Filled 2011-10-22 (×5): qty 1

## 2011-10-22 MED ORDER — EPHEDRINE SULFATE 50 MG/ML IJ SOLN
INTRAMUSCULAR | Status: DC | PRN
  Administered 2011-10-22: 15 mg via INTRAVENOUS

## 2011-10-22 MED ORDER — METOCLOPRAMIDE HCL 5 MG/ML IJ SOLN: 5.0000 mg | Freq: Three times a day (TID) | INTRAMUSCULAR | Status: DC | PRN

## 2011-10-22 MED ORDER — HYDROMORPHONE HCL PF 1 MG/ML IJ SOLN
0.2500 mg | INTRAMUSCULAR | Status: DC | PRN
  Administered 2011-10-22: 0.5 mg via INTRAVENOUS

## 2011-10-22 MED ORDER — VITAMIN B-6 100 MG PO TABS
100.0000 mg | ORAL_TABLET | Freq: Two times a day (BID) | ORAL | Status: DC
  Administered 2011-10-22 – 2011-10-24 (×3): 100 mg via ORAL
  Filled 2011-10-22 (×5): qty 1

## 2011-10-22 MED ORDER — DEXAMETHASONE SODIUM PHOSPHATE 10 MG/ML IJ SOLN
10.0000 mg | Freq: Once | INTRAMUSCULAR | Status: AC
  Administered 2011-10-23: 10 mg via INTRAVENOUS
  Filled 2011-10-22: qty 1

## 2011-10-22 MED ORDER — DIPHENHYDRAMINE HCL 12.5 MG/5ML PO ELIX: 12.5000 mg | ORAL_SOLUTION | ORAL | Status: DC | PRN

## 2011-10-22 MED ORDER — LIP MEDEX EX OINT
TOPICAL_OINTMENT | CUTANEOUS | Status: AC
  Filled 2011-10-22: qty 7

## 2011-10-22 MED ORDER — FLEET ENEMA 7-19 GM/118ML RE ENEM: 1.0000 | ENEMA | Freq: Once | RECTAL | Status: AC | PRN

## 2011-10-22 MED ORDER — HYDROCHLOROTHIAZIDE 12.5 MG PO CAPS
12.5000 mg | ORAL_CAPSULE | Freq: Every day | ORAL | Status: DC
  Administered 2011-10-24: 12.5 mg via ORAL
  Filled 2011-10-22 (×2): qty 1

## 2011-10-22 MED ORDER — 0.9 % SODIUM CHLORIDE (POUR BTL) OPTIME
TOPICAL | Status: DC | PRN
  Administered 2011-10-22: 1000 mL

## 2011-10-22 MED ORDER — CALCIUM CARBONATE 1250 (500 CA) MG PO TABS
1.0000 | ORAL_TABLET | Freq: Two times a day (BID) | ORAL | Status: DC
  Administered 2011-10-23 – 2011-10-24 (×3): 500 mg via ORAL
  Filled 2011-10-22 (×5): qty 1

## 2011-10-22 MED ORDER — METHOCARBAMOL 500 MG PO TABS
500.0000 mg | ORAL_TABLET | Freq: Four times a day (QID) | ORAL | Status: DC | PRN
  Administered 2011-10-23 – 2011-10-24 (×4): 500 mg via ORAL
  Filled 2011-10-22 (×4): qty 1

## 2011-10-22 MED ORDER — HYDROMORPHONE HCL PF 1 MG/ML IJ SOLN
INTRAMUSCULAR | Status: AC
  Filled 2011-10-22: qty 1

## 2011-10-22 MED ORDER — HETASTARCH-ELECTROLYTES 6 % IV SOLN
INTRAVENOUS | Status: DC | PRN
  Administered 2011-10-22: 11:00:00 via INTRAVENOUS

## 2011-10-22 MED ORDER — ONDANSETRON HCL 4 MG/2ML IJ SOLN: 4.0000 mg | Freq: Four times a day (QID) | INTRAMUSCULAR | Status: DC | PRN

## 2011-10-22 MED ORDER — VITAMIN D3 25 MCG (1000 UNIT) PO TABS
1000.0000 [IU] | ORAL_TABLET | Freq: Two times a day (BID) | ORAL | Status: DC
  Administered 2011-10-22 – 2011-10-24 (×4): 1000 [IU] via ORAL
  Filled 2011-10-22 (×5): qty 1

## 2011-10-22 MED ORDER — HYDROCODONE-ACETAMINOPHEN 7.5-325 MG PO TABS
1.0000 | ORAL_TABLET | ORAL | Status: DC | PRN
  Administered 2011-10-22 – 2011-10-23 (×7): 1 via ORAL
  Administered 2011-10-24: 2 via ORAL
  Administered 2011-10-24 (×3): 1 via ORAL
  Filled 2011-10-22 (×3): qty 1
  Filled 2011-10-22: qty 2
  Filled 2011-10-22 (×7): qty 1

## 2011-10-22 MED ORDER — METOCLOPRAMIDE HCL 10 MG PO TABS: 5.0000 mg | ORAL_TABLET | Freq: Three times a day (TID) | ORAL | Status: DC | PRN

## 2011-10-22 MED ORDER — DROPERIDOL 2.5 MG/ML IJ SOLN
INTRAMUSCULAR | Status: DC | PRN
  Administered 2011-10-22: 0.625 mg via INTRAVENOUS

## 2011-10-22 MED ORDER — MENTHOL 3 MG MT LOZG: 1.0000 | LOZENGE | OROMUCOSAL | Status: DC | PRN

## 2011-10-22 MED ORDER — FENTANYL CITRATE 0.05 MG/ML IJ SOLN
INTRAMUSCULAR | Status: DC | PRN
  Administered 2011-10-22 (×3): 50 ug via INTRAVENOUS
  Administered 2011-10-22: 100 ug via INTRAVENOUS

## 2011-10-22 MED ORDER — CEFAZOLIN SODIUM 1-5 GM-% IV SOLN
1.0000 g | Freq: Four times a day (QID) | INTRAVENOUS | Status: AC
  Administered 2011-10-22 – 2011-10-23 (×3): 1 g via INTRAVENOUS
  Filled 2011-10-22 (×3): qty 50

## 2011-10-22 MED ORDER — ROCURONIUM BROMIDE 100 MG/10ML IV SOLN
INTRAVENOUS | Status: DC | PRN
  Administered 2011-10-22: 20 mg via INTRAVENOUS
  Administered 2011-10-22 (×2): 15 mg via INTRAVENOUS
  Administered 2011-10-22: 20 mg via INTRAVENOUS
  Administered 2011-10-22: 40 mg via INTRAVENOUS

## 2011-10-22 MED ORDER — SODIUM CHLORIDE 0.9 % IR SOLN
Status: DC | PRN
  Administered 2011-10-22: 3000 mL

## 2011-10-22 MED ORDER — ACETAMINOPHEN 325 MG PO TABS: 650.0000 mg | ORAL_TABLET | Freq: Four times a day (QID) | ORAL | Status: DC | PRN

## 2011-10-22 MED ORDER — ALUM & MAG HYDROXIDE-SIMETH 200-200-20 MG/5ML PO SUSP: 30.0000 mL | ORAL | Status: DC | PRN

## 2011-10-22 MED ORDER — FERROUS SULFATE 325 (65 FE) MG PO TABS
325.0000 mg | ORAL_TABLET | Freq: Three times a day (TID) | ORAL | Status: DC
  Administered 2011-10-23 – 2011-10-24 (×4): 325 mg via ORAL
  Filled 2011-10-22 (×7): qty 1

## 2011-10-22 MED ORDER — PREGABALIN 75 MG PO CAPS
200.0000 mg | ORAL_CAPSULE | Freq: Every day | ORAL | Status: DC
  Administered 2011-10-22 – 2011-10-23 (×2): 200 mg via ORAL
  Filled 2011-10-22: qty 3
  Filled 2011-10-22: qty 2

## 2011-10-22 MED ORDER — ADULT MULTIVITAMIN W/MINERALS CH
1.0000 | ORAL_TABLET | Freq: Every day | ORAL | Status: DC
  Administered 2011-10-23 – 2011-10-24 (×2): 1 via ORAL
  Filled 2011-10-22 (×3): qty 1

## 2011-10-22 MED ORDER — ONDANSETRON HCL 4 MG/2ML IJ SOLN
INTRAMUSCULAR | Status: DC | PRN
  Administered 2011-10-22: 4 mg via INTRAVENOUS

## 2011-10-22 MED ORDER — DOCUSATE SODIUM 100 MG PO CAPS
100.0000 mg | ORAL_CAPSULE | Freq: Two times a day (BID) | ORAL | Status: DC
  Administered 2011-10-22 – 2011-10-24 (×2): 100 mg via ORAL
  Filled 2011-10-22 (×5): qty 1

## 2011-10-22 MED ORDER — HYDROMORPHONE HCL PF 1 MG/ML IJ SOLN
INTRAMUSCULAR | Status: DC | PRN
  Administered 2011-10-22 (×2): .5 mg via INTRAVENOUS

## 2011-10-22 MED ORDER — ESTRADIOL 1 MG PO TABS
0.5000 mg | ORAL_TABLET | Freq: Every day | ORAL | Status: DC
  Administered 2011-10-22 – 2011-10-23 (×2): 0.5 mg via ORAL
  Filled 2011-10-22 (×3): qty 0.5

## 2011-10-22 MED ORDER — PROPOFOL 10 MG/ML IV EMUL
INTRAVENOUS | Status: DC | PRN
  Administered 2011-10-22: 140 mg via INTRAVENOUS

## 2011-10-22 MED ORDER — PROMETHAZINE HCL 25 MG/ML IJ SOLN: 6.2500 mg | INTRAMUSCULAR | Status: DC | PRN

## 2011-10-22 MED ORDER — LACTATED RINGERS IV SOLN
INTRAVENOUS | Status: DC | PRN
  Administered 2011-10-22 (×5): via INTRAVENOUS

## 2011-10-22 MED ORDER — HYDROMORPHONE HCL PF 1 MG/ML IJ SOLN
0.5000 mg | INTRAMUSCULAR | Status: DC | PRN
  Administered 2011-10-22: 1 mg via INTRAVENOUS
  Administered 2011-10-22: 0.5 mg via INTRAVENOUS
  Filled 2011-10-22: qty 1

## 2011-10-22 MED ORDER — ALPRAZOLAM 0.25 MG PO TABS
0.2500 mg | ORAL_TABLET | Freq: Every evening | ORAL | Status: DC | PRN
  Administered 2011-10-24: 0.25 mg via ORAL
  Filled 2011-10-22: qty 1

## 2011-10-22 MED ORDER — GLYCOPYRROLATE 0.2 MG/ML IJ SOLN
INTRAMUSCULAR | Status: DC | PRN
  Administered 2011-10-22: .6 mg via INTRAVENOUS

## 2011-10-22 MED ORDER — ACETAMINOPHEN 650 MG RE SUPP: 650.0000 mg | Freq: Four times a day (QID) | RECTAL | Status: DC | PRN

## 2011-10-22 MED ORDER — CALCIUM CARBONATE 600 MG PO TABS
600.0000 mg | ORAL_TABLET | Freq: Two times a day (BID) | ORAL | Status: DC
  Filled 2011-10-22: qty 1

## 2011-10-22 MED ORDER — SODIUM CHLORIDE 0.9 % IV SOLN
INTRAVENOUS | Status: DC
  Administered 2011-10-22 – 2011-10-23 (×2): via INTRAVENOUS
  Filled 2011-10-22 (×6): qty 1000

## 2011-10-22 MED ORDER — TRANDOLAPRIL 1 MG PO TABS
1.0000 mg | ORAL_TABLET | Freq: Every day | ORAL | Status: DC
  Administered 2011-10-24: 1 mg via ORAL
  Filled 2011-10-22 (×2): qty 1

## 2011-10-22 MED ORDER — MOEXIPRIL-HYDROCHLOROTHIAZIDE 15-25 MG PO TABS: 0.5000 | ORAL_TABLET | Freq: Every day | ORAL | Status: DC

## 2011-10-22 MED ORDER — ASPIRIN EC 81 MG PO TBEC
81.0000 mg | DELAYED_RELEASE_TABLET | Freq: Every day | ORAL | Status: DC
  Administered 2011-10-22 – 2011-10-23 (×2): 81 mg via ORAL
  Filled 2011-10-22 (×3): qty 1

## 2011-10-22 MED ORDER — MEDROXYPROGESTERONE ACETATE 2.5 MG PO TABS
1.2500 mg | ORAL_TABLET | Freq: Every day | ORAL | Status: DC
  Administered 2011-10-23 – 2011-10-24 (×2): 1.25 mg via ORAL
  Filled 2011-10-22 (×3): qty 0.5

## 2011-10-22 MED ORDER — LIDOCAINE HCL (CARDIAC) 20 MG/ML IV SOLN
INTRAVENOUS | Status: DC | PRN
  Administered 2011-10-22: 100 mg via INTRAVENOUS

## 2011-10-22 MED ORDER — RIVAROXABAN 10 MG PO TABS
10.0000 mg | ORAL_TABLET | Freq: Every day | ORAL | Status: DC
  Administered 2011-10-23 – 2011-10-24 (×2): 10 mg via ORAL
  Filled 2011-10-22 (×2): qty 1

## 2011-10-22 MED ORDER — DEXAMETHASONE SODIUM PHOSPHATE 10 MG/ML IJ SOLN
INTRAMUSCULAR | Status: DC | PRN
  Administered 2011-10-22: 10 mg via INTRAVENOUS

## 2011-10-22 SURGICAL SUPPLY — 65 items
BAG ZIPLOCK 12X15 (MISCELLANEOUS) ×9 IMPLANT
BLADE SAW SGTL 18X1.27X75 (BLADE) ×6 IMPLANT
BRUSH FEMORAL CANAL (MISCELLANEOUS) ×3 IMPLANT
CELLS DAT CNTRL 66122 CELL SVR (MISCELLANEOUS) ×2 IMPLANT
CLOTH BEACON ORANGE TIMEOUT ST (SAFETY) ×6 IMPLANT
DERMABOND ADVANCED (GAUZE/BANDAGES/DRESSINGS) ×1
DERMABOND ADVANCED .7 DNX12 (GAUZE/BANDAGES/DRESSINGS) ×2 IMPLANT
DRAPE C-ARM 42X72 X-RAY (DRAPES) ×3 IMPLANT
DRAPE INCISE IOBAN 85X60 (DRAPES) ×3 IMPLANT
DRAPE ORTHO SPLIT 77X108 STRL (DRAPES) ×2
DRAPE POUCH INSTRU U-SHP 10X18 (DRAPES) ×3 IMPLANT
DRAPE STERI IOBAN 125X83 (DRAPES) ×3 IMPLANT
DRAPE SURG 17X11 SM STRL (DRAPES) ×3 IMPLANT
DRAPE SURG ORHT 6 SPLT 77X108 (DRAPES) ×4 IMPLANT
DRAPE U-SHAPE 47X51 STRL (DRAPES) ×12 IMPLANT
DRSG AQUACEL AG ADV 3.5X10 (GAUZE/BANDAGES/DRESSINGS) ×3 IMPLANT
DRSG EMULSION OIL 3X16 NADH (GAUZE/BANDAGES/DRESSINGS) ×3 IMPLANT
DRSG MEPILEX BORDER 4X4 (GAUZE/BANDAGES/DRESSINGS) ×3 IMPLANT
DRSG MEPILEX BORDER 4X8 (GAUZE/BANDAGES/DRESSINGS) ×3 IMPLANT
DRSG TEGADERM 4X4.75 (GAUZE/BANDAGES/DRESSINGS) ×6 IMPLANT
DURAPREP 26ML APPLICATOR (WOUND CARE) ×6 IMPLANT
ELECT BLADE TIP CTD 4 INCH (ELECTRODE) ×6 IMPLANT
ELECT REM PT RETURN 9FT ADLT (ELECTROSURGICAL) ×6
ELECTRODE REM PT RTRN 9FT ADLT (ELECTROSURGICAL) ×4 IMPLANT
ELIMINATOR HOLE APEX DEPUY (Hips) IMPLANT
EVACUATOR 1/8 PVC DRAIN (DRAIN) ×3 IMPLANT
FACESHIELD LNG OPTICON STERILE (SAFETY) ×24 IMPLANT
GAUZE SPONGE 2X2 8PLY STRL LF (GAUZE/BANDAGES/DRESSINGS) ×2 IMPLANT
GLOVE BIOGEL PI IND STRL 7.5 (GLOVE) ×4 IMPLANT
GLOVE BIOGEL PI IND STRL 8 (GLOVE) ×4 IMPLANT
GLOVE BIOGEL PI INDICATOR 7.5 (GLOVE) ×2
GLOVE BIOGEL PI INDICATOR 8 (GLOVE) ×2
GLOVE ECLIPSE 8.0 STRL XLNG CF (GLOVE) ×3 IMPLANT
GLOVE ORTHO TXT STRL SZ7.5 (GLOVE) ×12 IMPLANT
GLOVE SURG ORTHO 8.0 STRL STRW (GLOVE) ×3 IMPLANT
GOWN BRE IMP PREV XXLGXLNG (GOWN DISPOSABLE) ×6 IMPLANT
GOWN STRL NON-REIN LRG LVL3 (GOWN DISPOSABLE) ×6 IMPLANT
HANDPIECE INTERPULSE COAX TIP (DISPOSABLE) ×1
HEAD CERAMIC 36 PLUS5 (Hips) ×3 IMPLANT
KIT BASIN OR (CUSTOM PROCEDURE TRAY) ×6 IMPLANT
LINER NEUTRAL 52X36MM PLUS 4 (Liner) ×3 IMPLANT
MANIFOLD NEPTUNE II (INSTRUMENTS) ×3 IMPLANT
NS IRRIG 1000ML POUR BTL (IV SOLUTION) ×6 IMPLANT
PACK TOTAL JOINT (CUSTOM PROCEDURE TRAY) ×6 IMPLANT
PADDING CAST COTTON 6X4 STRL (CAST SUPPLIES) ×3 IMPLANT
PIN SECTOR W/GRIP ACE CUP 52MM (Hips) ×3 IMPLANT
POSITIONER SURGICAL ARM (MISCELLANEOUS) ×3 IMPLANT
PRESSURIZER FEMORAL UNIV (MISCELLANEOUS) ×3 IMPLANT
RTRCTR WOUND ALEXIS 18CM MED (MISCELLANEOUS) ×3
SCREW 6.5MMX35MM (Screw) ×3 IMPLANT
SET HNDPC FAN SPRY TIP SCT (DISPOSABLE) ×2 IMPLANT
SPONGE GAUZE 2X2 STER 10/PKG (GAUZE/BANDAGES/DRESSINGS) ×1
SPONGE LAP 18X18 X RAY DECT (DISPOSABLE) ×3 IMPLANT
SPONGE LAP 4X18 X RAY DECT (DISPOSABLE) ×3 IMPLANT
STAPLER VISISTAT 35W (STAPLE) ×3 IMPLANT
SUCTION FRAZIER 12FR DISP (SUCTIONS) ×3 IMPLANT
SUCTION FRAZIER TIP 10 FR DISP (SUCTIONS) ×3 IMPLANT
SUT MNCRL AB 4-0 PS2 18 (SUTURE) ×3 IMPLANT
SUT VIC AB 1 CT1 36 (SUTURE) ×21 IMPLANT
SUT VIC AB 2-0 CT1 27 (SUTURE) ×5
SUT VIC AB 2-0 CT1 TAPERPNT 27 (SUTURE) ×10 IMPLANT
TOWEL OR 17X26 10 PK STRL BLUE (TOWEL DISPOSABLE) ×12 IMPLANT
TOWER CARTRIDGE SMART MIX (DISPOSABLE) ×3 IMPLANT
TRAY FOLEY CATH 14FRSI W/METER (CATHETERS) ×6 IMPLANT
WATER STERILE IRR 1500ML POUR (IV SOLUTION) ×3 IMPLANT

## 2011-10-22 NOTE — Plan of Care (Signed)
Problem: Consults Goal: Diagnosis- Total Joint Replacement Left anterior hip Revision Right hip posterior

## 2011-10-22 NOTE — Transfer of Care (Signed)
Immediate Anesthesia Transfer of Care Note  Patient: Gwendolyn [name]  Procedure(s) Performed: Procedure(s) (LRB): ACETABULAR REVISION (Right) TOTAL HIP ARTHROPLASTY ANTERIOR APPROACH (Left)  Patient Location: PACU  Anesthesia Type: General  Level of Consciousness: sedated and patient cooperative  Airway & Oxygen Therapy: Patient Spontanous Breathing and Patient connected to face mask oxygen  Post-op Assessment: Report given to PACU RN and Post -op Vital signs reviewed and stable  Post vital signs: Reviewed and stable  Complications: No apparent anesthesia complications

## 2011-10-22 NOTE — H&P (View-Only) (Signed)
H&P performed 10/14/11 Dictation # [id number]

## 2011-10-22 NOTE — Brief Op Note (Signed)
10/22/2011  11:41 AM  PATIENT:  Gwendolyn [name]  71 y.o. female  PRE-OPERATIVE DIAGNOSIS: 1. 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 Left hip osteoarthritis  POST-OPERATIVE DIAGNOSIS: 1. 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 Left hip osteoarthritis  PROCEDURE:  Procedure(s) (LRB): 1. Revision right acetabular component and femoral head, 2. Left total hip replacement   SURGEON:  Surgeon(s) and Role:    * Shelda Pal, MD - Primary  PHYSICIAN ASSISTANT: Lanney Gins, PA-C  ANESTHESIA:   general  EBL:  Total I/O In: 4400 [I.V.:4000; IV Piggyback:400] Out: 700 [Urine:200; Blood:500]  BLOOD ADMINISTERED:none  DRAINS: (2 medium) Hemovact drain(s) in the right and left hips with  Suction Open   LOCAL MEDICATIONS USED:  NONE  SPECIMEN:  No Specimen  DISPOSITION OF SPECIMEN:  N/A  COUNTS:  YES  TOURNIQUET:  * No tourniquets in log *  DICTATION: .Other Dictation: Dictation Number Right hip - [id number], left hip - [id number]  PLAN OF CARE: Admit to inpatient   PATIENT DISPOSITION:  PACU - hemodynamically stable.   Delay start of Pharmacological VTE agent (>24hrs) due to surgical blood loss or risk of bleeding: no

## 2011-10-22 NOTE — Anesthesia Postprocedure Evaluation (Signed)
  Anesthesia Post-op Note  Patient: Gwendolyn [name]  Procedure(s) Performed: Procedure(s) (LRB): ACETABULAR REVISION (Right) TOTAL HIP ARTHROPLASTY ANTERIOR APPROACH (Left)  Patient Location: PACU  Anesthesia Type: General  Level of Consciousness: awake and alert   Airway and Oxygen Therapy: Patient Spontanous Breathing  Post-op Pain: mild  Post-op Assessment: Post-op Vital signs reviewed, Patient's Cardiovascular Status Stable, Respiratory Function Stable, Patent Airway and No signs of Nausea or vomiting  Post-op Vital Signs: stable  Complications: No apparent anesthesia complications

## 2011-10-22 NOTE — Interval H&P Note (Signed)
History and Physical Interval Note:  10/22/2011 6:50 AM  Gwendolyn [name]  has presented today for surgery, with the diagnosis of failed right total hip, left hip osteoarthritis  The various methods of treatment have been discussed with the patient and family. After consideration of risks, benefits and other options for treatment, the patient has consented to  Procedure(s) (LRB): ACETABULAR REVISION (Right) TOTAL HIP ARTHROPLASTY ANTERIOR APPROACH (Left) as a surgical intervention .  The patients' history has been reviewed, patient examined, no change in status, stable for surgery.  I have reviewed the patients' chart and labs.  Questions were answered to the patient's satisfaction.     Shelda Pal

## 2011-10-23 LAB — CBC
HCT: 26.8 % — ABNORMAL LOW (ref 36.0–46.0)
MCHC: 33.6 g/dL (ref 30.0–36.0)
MCV: 83 fL (ref 78.0–100.0)
Platelets: 241 10*3/uL (ref 150–400)
RDW: 15.1 % (ref 11.5–15.5)
WBC: 11.7 10*3/uL — ABNORMAL HIGH (ref 4.0–10.5)

## 2011-10-23 LAB — BASIC METABOLIC PANEL
BUN: 16 mg/dL (ref 6–23)
Chloride: 102 mEq/L (ref 96–112)
Creatinine, Ser: 0.84 mg/dL (ref 0.50–1.10)
GFR calc Af Amer: 80 mL/min — ABNORMAL LOW (ref >90)
GFR calc non Af Amer: 69 mL/min — ABNORMAL LOW (ref >90)

## 2011-10-23 NOTE — Progress Notes (Signed)
Subjective: 1 Day Post-Op Procedure(s) (LRB): ACETABULAR REVISION (Right) TOTAL HIP ARTHROPLASTY ANTERIOR APPROACH (Left)   Patient reports pain as mild. No events.  Objective:   VITALS:   Filed Vitals:   10/23/11 0545  BP: 119/68  Pulse: 97  Temp: 98.1 F (36.7 C)  Resp: 16    Neurovascular intact Dorsiflexion/Plantar flexion intact Incision: dressing C/D/I No cellulitis present Compartment soft  LABS  Basename 10/23/11 0428  HGB 9.0*  HCT 26.8*  WBC 11.7*  PLT 241     Basename 10/23/11 0428  NA 135  K 4.7  BUN 16  CREATININE 0.84  GLUCOSE 126*     Assessment/Plan: 1 Day Post-Op Procedure(s) (LRB): ACETABULAR REVISION (Right) TOTAL HIP ARTHROPLASTY ANTERIOR APPROACH (Left)   Foley cath d/c'ed after 1st therapy HV drains d/c'ed Advance diet Up with therapy D/C IV fluids Plan for discharge tomorrow to home with HHPT   Anastasio Auerbach. Lazaria Schaben   PAC  10/23/2011, 8:09 AM

## 2011-10-23 NOTE — Progress Notes (Signed)
Utilization review completed.  

## 2011-10-23 NOTE — Evaluation (Signed)
Physical Therapy Evaluation Patient Details Name: Gwendolyn [name] MRN: [id number] DOB: 02/24/1941 Today's Date: 10/23/2011  Problem List: There is no problem list on file for this patient.   Past Medical History:  Past Medical History  Diagnosis Date  . Hypertension   . Arthritis   . Anemia   . 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 Pelvis fracture March 26, 2002    both sides fx   Past Surgical History:  Past Surgical History  Procedure Date  . Right shoulder humerus fx july 2003    surgery done  . Right hand ulner nerve surgery March 26, 2002    I and d done right hand/wrist  . Right elbow   surgery March 26, 2002  . Right wrist plating 2004  . Right total hip arthroplasty 2008  . 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 Tonsillectomy age 29    PT Assessment/Plan/Recommendation PT Assessment Clinical Impression Statement: Pt with L THR (and dir) and R THR revision (post) presents with decreased Bil LE strength/ROM and ltd functional mobility. PT Recommendation/Assessment: Patient will need skilled PT in the acute care venue PT Problem List: Decreased strength;Decreased range of motion;Decreased activity tolerance;Decreased knowledge of use of DME;Decreased knowledge of precautions;Pain;Decreased mobility PT Therapy Diagnosis : Difficulty walking PT Plan PT Frequency: 7X/week PT Treatment/Interventions: DME instruction;Gait training;Stair training;Functional mobility training;Therapeutic activities;Therapeutic exercise;Patient/family education PT Recommendation Recommendations for Other Services: OT consult Follow Up Recommendations: Home health PT Equipment Recommended: None recommended by PT PT Goals  Acute Rehab PT Goals PT Goal Formulation: With patient Time For Goal Achievement: 7 days Pt will go Supine/Side to Sit: with supervision PT Goal: Supine/Side to Sit - Progress: Goal set today Pt will go Sit to Supine/Side: with supervision PT Goal: Sit to  Supine/Side - Progress: Goal set today Pt will go Sit to Stand: with supervision PT Goal: Sit to Stand - Progress: Goal set today Pt will go Stand to Sit: with supervision PT Goal: Stand to Sit - Progress: Goal set today Pt will Ambulate: 51 - 150 feet;with supervision;with rolling walker PT Goal: Ambulate - Progress: Goal set today Pt will Go Up / Down Stairs: 1-2 stairs;with min assist;with least restrictive assistive device PT Goal: Up/Down Stairs - Progress: Goal set today  PT Evaluation Precautions/Restrictions  Precautions Precautions: Posterior Hip Precaution Comments: R side posterior precautions; no precautions on L; sign hung in room Restrictions Weight Bearing Restrictions: No Other Position/Activity Restrictions: WBAT Prior Functioning  Home Living Lives With: Significant other Receives Help From: Family Type of Home: House Home Layout: One level Home Access: Stairs to enter Entrance Stairs-Rails: None Entrance Stairs-Number of Steps: 1 (1 onto porch and 1 into house) Home Adaptive Equipment: Walker - rolling Prior Function Level of Independence: Independent with basic ADLs;Independent with gait;Independent with transfers Able to Take Stairs?: Yes Driving: Yes Cognition Cognition Arousal/Alertness: Awake/alert Overall Cognitive Status: Appears within functional limits for tasks assessed Orientation Level: Oriented X4 Sensation/Coordination Coordination Gross Motor Movements are Fluid and Coordinated: Yes Extremity Assessment RUE Assessment RUE Assessment: Within Functional Limits LUE Assessment LUE Assessment: Within Functional Limits RLE Assessment RLE Assessment: Exceptions to [facility] (2+/5 hip; 3/5 quads; AAROM WFL following THP) LLE Assessment LLE Assessment: Exceptions to [facility] (2/5 hip; 3/5 quads; AAROM WFL) Mobility (including Balance) Bed Mobility Bed Mobility: Yes Supine to Sit: 4: Min assist;3: Mod assist Supine to Sit Details (indicate cue type and  reason): cues for sequence/technique and THP on  R Transfers Transfers: Yes Sit to Stand: 3: Mod assist;With upper extremity assist;From bed Sit to Stand Details (indicate cue type and reason): cues for LE position and use of UEs Stand to Sit: 3: Mod assist;With upper extremity assist;To chair/3-in-1;With armrests Stand to Sit Details: cues for LE position and use of UEs Ambulation/Gait Ambulation/Gait: Yes Ambulation/Gait Assistance: 3: Mod assist;4: Min assist Ambulation/Gait Assistance Details (indicate cue type and reason): cues for sequence and position from RW Ambulation Distance (Feet): 98 Feet Assistive device: Rolling walker Gait Pattern: Step-to pattern    Exercise  Total Joint Exercises Ankle Circles/Pumps: AROM;Both;10 reps;Supine Quad Sets: 10 reps;AROM;Both;Supine Heel Slides: AAROM;Both;10 reps;Supine Hip ABduction/ADduction: AAROM;10 reps;Supine;Both End of Session PT - End of Session Activity Tolerance: Patient tolerated treatment well Patient left: in chair;with call bell in reach;with family/visitor present Nurse Communication: Mobility status for transfers;Mobility status for ambulation General Behavior During Session: [facility] for tasks performed Cognition: [facility] for tasks performed  Vick Filter 10/23/2011, 12:16 PM

## 2011-10-23 NOTE — Op Note (Signed)
Gwendolyn [name], Gwendolyn [name] NO.:  [id number]  MEDICAL RECORD NO.:  [id number]  LOCATION:  1605                         FACILITY:  [facility]  PHYSICIAN:  Madlyn Frankel. Charlann Boxer, M.D.  DATE OF BIRTH:  Jul 19, 1941  DATE OF PROCEDURE:  10/22/2011 DATE OF DISCHARGE:                              OPERATIVE REPORT   This is the second operating procedure at the same time.  PREOPERATIVE DIAGNOSIS:  Left hip osteoarthritis, advanced.  POSTOPERATIVE DIAGNOSIS:  Left hip osteoarthritis, advanced.  PROCEDURE:  Left total hip replacement through an anterior approach utilizing components, size 52 Pinnacle shell, a 36+4 neutral liner, a size 5 standard Tri-Lock stem with a 36+5 delta ceramic ball.  SURGEON:  Madlyn Frankel. Charlann Boxer, M.D.  ASSISTANT:  Lanney Gins, PA-C.  Please note that Mr. Gwendolyn [name] was present for the entirety of both these cases and utilized for preoperative positioning of the patient, general facilitation of the case with fracture, soft tissue management as well as primary wound closure.  ANESTHESIA:  Continue with general anesthetic.  SPECIMENS:  None.  ESTIMATED BLOOD LOSS:  Approximately 250 mL for a total of 500 mL blood loss for both cases.  DRAINS:  One medium Hemovac drain in place.  INDICATIONS OF PROCEDURE:  Gwendolyn [name] is a 71 year old female who has been a patient of mine with a history of right total hip replacement in 2008 and subsequent revision today.  She has also developed progressive worsening left hip osteoarthritis with significant reduction of quality of life and was ready to proceed with this left total hip replacement and simultaneously with this revision of acetabulum.  Risks and benefits have been reviewed and particularly given the fact that we were changing from a posterior approach to the anterior approach she was eager to have this performed, standard risk of infection, DVT, component failure, all were discussed as well as the increased  risk associated with bilateral procedures.  Consent was obtained for benefit of pain management.  PROCEDURE IN DETAIL:  The patient was brought to the operative theater. Once adequate anesthesia and preoperative antibiotics, Ancef initially administered.  She was positioned now supine on a Hana table.  The left hip was pre-draped out.  Fluoroscopy was used to confirm orientation of the lower pelvis.  The left hip was then prepped and draped in usual sterile fashion using a shower curtain technique from proximal iliac crest to the mid thigh.  A time-out was performed identifying the patient's planned procedure in this extremity.  An incision was then made 2 cm lateral and distal to the anterior and superior iliac spine.  Sharp dissection was carried to the fascia.  The tensor fasciae latae muscle was then incised in line with its fibers.  The muscle was elevated off the undersurface of the fascia and retractor was placed along the superior neck.  The circumflex vessels were cauterized, pericapsular fat removed and a second Cobra retractor placed on the inferior neck.  A crescent-type incision was made into the capsule and capsular release was then tagged with a suture.  Retractors were then placed intracapsular and then a cut was then made based off her contralateral hip, into the trochanteric fossa.  Traction was then removed  and retractors were placed on the anterior acetabulum as well as posterior foveal tissue and labrum debrided.  I began reaming with a 44 mm reamer and reamed up to 51 mm reamer confirming the orientation of the reaming depth and the height of the cup position on the last reamer using fluoroscopy.  A final 52 mm Pinnacle cup was chosen and impacted under fluoroscopy confirming its depth that it was well seated as well as there was the planned abduction of the cup.  A final cancellous screw was placed and a 36+4 neutral liner was positioned and impacted in  place.  At this point, the femur was drilled to 80 degrees and further capsule tissue removed off the inferior portion of the neck and a Mueller retractor was then placed.  The lateral hook was placed in the femur and the femur elevated manually and held in position with the hook on the bed.  His leg was then extended and abducted and retractor placed posteriorly along the trochanter.  The proximal femur was then debrided of soft tissues and in particularly the capsular tissues, down to the attachment of the piriformis muscle intracapsularly.  A box osteotome was used to open up the proximal femur followed by broaching, initially broached to a size 3 broach, which sat relatively at the level to the neck cut.  I did a trial reduction at this point with a size 3 standard neck and a 1.5 ball trial reduction revealed that the leg lengths were still longer on the right side than the left. However, the orientation and components appeared to be anatomic without evidence or concerns for impingement.  The hip was then dislocated.  Retractors were placed and I ended up broaching up to a size 5 broach was now set prominent to my neck cut, but I felt it gave [place] good length that we needed.  Gwendolyn [name] also has had a right total knee replacement and there was a slight flexion contracture so she would feel a little bit longer on this side no matter what.  A trial reduction was carried out and I chose the standard neck again with a +5 ball.  With this, I felt the leg lengths were now more significantly better than they had been within a couple of millimeters again taking into account the flexion contracture of the right knee.  I Was attempting to match her two lower extremities with regards to leg lengths.  I chose this as my final component.  The trial components removed.  The final 5 standard stem was then impacted.  I retrialed and selected a 36+5 ball as the final ball.  The final ball  was impacted and the delta ceramic ball was impacted on the clean and dry trunnion and the hip was reduced.  We irrigated the hip throughout the case and again at this point I reapproximated the anterior capsular incision using #1 Vicryl. I placed a medium Hemovac drain deep to the fascia.  The tensor fasciae latae muscle was then reapproximated using #1 Vicryl.  The remaining wound was closed with 2-0 Vicryl and running 4-0 Monocryl.  The hip was cleaned, dried, and dressed sterilely using Dermabond and Aquacel dressing.  Drain site dressed separately.  At this point, the drapes were removed.  The patient was extubated and brought to the recovery room in stable condition tolerating these procedures well.     Madlyn Frankel Charlann Boxer, M.D.     MDO/MEDQ  D:  10/22/2011  T:  10/23/2011  Job:  [id number]

## 2011-10-23 NOTE — Op Note (Signed)
NAMESARAHI, [name] NO.:  [id number]  MEDICAL RECORD NO.:  [id number]  LOCATION:  1605                         FACILITY:  [facility]  PHYSICIAN:  Madlyn Frankel. Charlann Boxer, M.D.  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  PROCEDURE:  Revision right total hip replacement with a Pinnacle 52 Gription cup single cancellous screw, and a 36+4 neutral liner, and a 36+5 delta ceramic ball.  SURGEON:  Madlyn Frankel. Charlann Boxer, M.D.  ASSISTANT:  Lanney Gins, PA.  Note that Mr. Gwendolyn [name] was present for the entirety of the case from preoperative positioning, perioperative retractor management, leg management as well as general facilitation of the case and primary wound closure.  ANESTHESIA:  General.  SPECIMENS:  None.  FINDINGS:  The patient has significant metal staining to surrounding tissues.  There was no evidence of significant bony destruction, muscular destruction, or muscle damage.  But significant metal staining and involvement of the trochanteric bursa, iliopsoas as well as tracking down towards the gluteus maximus, insertion on the femur.  SPECIMENS:  None.  DRAINS:  One medium Hemovac.  ESTIMATED BLOOD LOSS:  On this hip about 250 cc.  COMPLICATIONS:  None.  INDICATIONS OF PROCEDURE:  Mr. Gwendolyn [name] is a very pleasant 71 year old patient of mine, with a history of right total hip replacement approximately 5 years ago.  She has done well and never really complained of pain in the right hip, however, based on metal and metal concerns we discussed on revising this at the time of her left hip replacement based on osteoarthritis.  I had reviewed with her the potential etiologies of metal concerns and she wished to have this  addressed.  Risks of infection, DVT, component failure, and dislocation are reviewed in this setting.  Consent was obtained for benefit of management of this condition.  PROCEDURE IN DETAIL:  The patient was brought to the operative theater. Once adequate anesthesia, preoperative antibiotics, Ancef 2 g was administered.  She was initially positioned to the left lateral decubitus position with the right side up.  The right lower extremity was prepped and draped in a sterile fashion and a time-out was then performed, identifying the patient, planned procedure, and this extremity.  Her old incision was identified and extended distally and proximally about 1 cm to 2 cm each.  Soft dissection was carried down to the iliotibial band and gluteal fascia.  This layer was incised encountering any significant amount of metal stained fluid as well as this obvious metal-stained synovial tissue and bursal tissue.  First portion of the case was carried out at debridement as much of this tissue as I could I debrided it over the lateral trochanter and down into the area of the vastus lateralis, and then over the posterior aspect of the hip.  Further debridement of this metal-stained tissue was carried out, down towards the gluteus maximus insertion and around the acetabular shell and the anterior portion of the hip as the procedure carried on.  Once the hip was exposed and a pseudocapsule opened up posteriorly, the hip was dislocated and the femoral head was removed, noting some corrosion type change to the trunnion junction.  I had elevated the capsule and  gluteus minimus off the ilium and I was able to place the trunnion anterior and proximal to keep it out of the way. At this point, I exposed the posterior, superior, and inferior aspects of the acetabular shell.  I used a bone tamp and removed the metal liner.  Two screws were removed from the acetabulum.  The liner was replaced.  Then using the  cup extraction system, the acetabular shell was removed without difficulty.  This was a 50 mm cup and I reamed it one time with a 51 mm reamer.  I placed some bone grafting to the area of the pubic rami where there was some evidence of some metal staining and once I curetted this out this defect was filled with some bone.  A 52 Pinnacle Gription cup was chosen and impacted with a curved impactor eliminating any concern for vertically oriented cup as well as taken away any significant anteversion.  A single cancellous screw was placed into the ilium.  I then placed a trial of 36+4 neutral liner then did a trial reduction.  Combined anteversion was about 50 degrees of the hip abduction and it appeared to be much more appropriate.  Given these findings and stability of the hip, I chose a 36+4 neutral liner.  The trial components were removed and the final liner impacted into place.  I chose a 36+5 Delta ceramic ball and did this to limit any metal on metal issues around the trunnion.  I also did this knowing that the right lower extremity was a little bit longer than the left, but I am also going to be addressing the left hip at the same time.  The hip was irrigated with 3 L of normal saline solution following all the debridements as well as the final position of components.  I placed a medium Hemovac drain.  The iliotibial band was reapproximated using #1 Vicryl.  The remaining of the wound was closed with 2-0 Vicryl and a running 4-0 Monocryl.  The hip was cleaned, dried, and dressed sterilely with Dermabond and Aquacel dressing and the drain site was dressed separately.  This concluded the first portion of our case.  She was then positioned in supine position in preparation for her left total hip replacement and that will be dictated in a separate note.     Madlyn Frankel Charlann Boxer, M.D.     MDO/MEDQ  D:  10/22/2011  T:  10/23/2011  Job:  [id number]

## 2011-10-23 NOTE — Progress Notes (Signed)
Physical Therapy Treatment Patient Details Name: GIOVANNINA MUN MRN: [id number] DOB: [birth year] Today's Date: 10/23/2011  PT Assessment/Plan  PT - Assessment/Plan Comments on Treatment Session: Reviewed car transfer with pt and significant other PT Plan: Discharge plan remains appropriate PT Frequency: 7X/week Recommendations for Other Services: OT consult Follow Up Recommendations: Home health PT Equipment Recommended: None recommended by PT PT Goals  Acute Rehab PT Goals Time For Goal Achievement: 7 days Pt will go Supine/Side to Sit: with supervision PT Goal: Supine/Side to Sit - Progress: Progressing toward goal Pt will go Sit to Supine/Side: with supervision PT Goal: Sit to Supine/Side - Progress: Progressing toward goal Pt will go Sit to Stand: with supervision PT Goal: Sit to Stand - Progress: Progressing toward goal Pt will go Stand to Sit: with supervision PT Goal: Stand to Sit - Progress: Progressing toward goal Pt will Ambulate: 51 - 150 feet;with supervision;with rolling walker PT Goal: Ambulate - Progress: Progressing toward goal Pt will Go Up / Down Stairs: 1-2 stairs;with min assist;with least restrictive assistive device PT Goal: Up/Down Stairs - Progress: Goal set today  PT Treatment Precautions/Restrictions  Precautions Precautions: Posterior Hip Precaution Comments: R side posterior precautions; no precautions on L; sign hung in room Restrictions Weight Bearing Restrictions: No Other Position/Activity Restrictions: WBAT Mobility (including Balance) Bed Mobility Supine to Sit: 4: Min assist Supine to Sit Details (indicate cue type and reason): cues for sequence and THP on R Sit to Supine: 4: Min assist Sit to Supine - Details (indicate cue type and reason): cues for sequence and THP on R Transfers Sit to Stand: 4: Min assist Sit to Stand Details (indicate cue type and reason): cues for use of UEs and for R LE fwd/THP Stand to Sit: 4: Min  assist Stand to Sit Details: cues for use of UEs and for R LE fwd/THP Ambulation/Gait Ambulation/Gait Assistance: 4: Min assist Ambulation/Gait Assistance Details (indicate cue type and reason): cues for sequence, ER on R, position from RW and posture Ambulation Distance (Feet): 100 Feet Assistive device: Rolling walker Gait Pattern: Step-through pattern;Step-to pattern    Exercise    End of Session PT - End of Session Activity Tolerance: Patient tolerated treatment well Patient left: in chair;with call bell in reach;with family/visitor present Nurse Communication: Mobility status for transfers;Mobility status for ambulation General Behavior During Session: [facility] for tasks performed Cognition: [facility] for tasks performed  Isaiahs Chancy 10/23/2011, 2:43 PM

## 2011-10-24 LAB — CBC
MCHC: 32.9 g/dL (ref 30.0–36.0)
Platelets: 212 10*3/uL (ref 150–400)
RDW: 15.8 % — ABNORMAL HIGH (ref 11.5–15.5)
WBC: 11.2 10*3/uL — ABNORMAL HIGH (ref 4.0–10.5)

## 2011-10-24 LAB — BASIC METABOLIC PANEL
BUN: 24 mg/dL — ABNORMAL HIGH (ref 6–23)
CO2: 27 mEq/L (ref 19–32)
Calcium: 8.1 mg/dL — ABNORMAL LOW (ref 8.4–10.5)
Creatinine, Ser: 0.99 mg/dL (ref 0.50–1.10)
Glucose, Bld: 108 mg/dL — ABNORMAL HIGH (ref 70–99)

## 2011-10-24 MED ORDER — POLYETHYLENE GLYCOL 3350 17 G PO PACK: 17.0000 g | PACK | Freq: Two times a day (BID) | ORAL | Status: AC

## 2011-10-24 MED ORDER — ASPIRIN EC 325 MG PO TBEC: 325.0000 mg | DELAYED_RELEASE_TABLET | Freq: Every day | ORAL | Status: AC

## 2011-10-24 MED ORDER — HYDROCODONE-ACETAMINOPHEN 7.5-325 MG PO TABS: 1.0000 | ORAL_TABLET | ORAL | Status: AC | PRN

## 2011-10-24 MED ORDER — DSS 100 MG PO CAPS: 100.0000 mg | ORAL_CAPSULE | Freq: Two times a day (BID) | ORAL | Status: AC

## 2011-10-24 MED ORDER — METHOCARBAMOL 500 MG PO TABS: 500.0000 mg | ORAL_TABLET | Freq: Four times a day (QID) | ORAL | Status: AC | PRN

## 2011-10-24 MED ORDER — FERROUS SULFATE 325 (65 FE) MG PO TABS: 325.0000 mg | ORAL_TABLET | Freq: Three times a day (TID) | ORAL | Status: DC

## 2011-10-24 MED ORDER — RIVAROXABAN 10 MG PO TABS: 10.0000 mg | ORAL_TABLET | Freq: Every day | ORAL | Status: DC

## 2011-10-24 NOTE — Progress Notes (Signed)
Subjective: 2 Days Post-Op Procedure(s) (LRB): ACETABULAR REVISION (Right) TOTAL HIP ARTHROPLASTY ANTERIOR APPROACH (Left)   Patient reports pain as mild. Some muscle spasms, but otherwise not a lot of pain. Otherwise no events. Ready to be discharged to home.  Objective:   VITALS:   Filed Vitals:   10/24/11 0640  BP: 137/71  Pulse: 95  Temp: 98.5 F (36.9 C)  Resp: 16    Neurovascular intact Dorsiflexion/Plantar flexion intact Incision: dressing C/D/I No cellulitis present Compartment soft  LABS  Basename 10/24/11 0438 10/23/11 0428  HGB 7.8* 9.0*  HCT 23.7* 26.8*  WBC 11.2* 11.7*  PLT 212 241     Basename 10/24/11 0438 10/23/11 0428  NA 138 135  K 3.7 4.7  BUN 24* 16  CREATININE 0.99 0.84  GLUCOSE 108* 126*     Assessment/Plan: 2 Days Post-Op Procedure(s) (LRB): ACETABULAR REVISION (Right) TOTAL HIP ARTHROPLASTY ANTERIOR APPROACH (Left)   Up with therapy Discharge home with home health Follow up in 2 weeks  Follow-up Information    Follow up with OLIN,Elgin Carn D in 2 weeks.   Contact information:   [facility] [address], [address] [place] [address] [phone]          Anastasio Auerbach. Latyra Jaye   PAC  10/24/2011, 8:03 AM

## 2011-10-24 NOTE — Progress Notes (Signed)
CM spoke with pt concerning d/c planning. Pt confirmed [facility] to provide HHPT. Pt has access to DME. CM to contact [facility] to notify agency concerning discharge.   Gwendolyn [name] [phone]

## 2011-10-24 NOTE — Discharge Summary (Signed)
Physician Discharge Summary  Patient ID: Gwendolyn [name] MRN: [id number] DOB/AGE: [birth year] 71 y.o.  Admit date: 10/22/2011 Discharge date: 10/24/2011  Procedures:  Procedure(s) (LRB): ACETABULAR REVISION (Right) TOTAL HIP ARTHROPLASTY ANTERIOR APPROACH (Left)  Attending Physician: Shelda Pal, MD   Admission Diagnoses: 1. 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         2. Osteoarthritis, left hip.  Discharge Diagnoses:  S/P right hip revision S/P left THA, anterior approach HTN Ulnar neuritis of the right arm Osteopenia  HPI: This is a 71 year old lady with a history of a previous metal-on-metal total hip arthroplasty on the right with early wear and suboptimal position of acetabular component. This is giving her some difficulties; and with the concern of metal-on-metal wear while being in the hospital for her left anterior total hip arthroplasty, discussion was had and decision was made to go ahead and change the acetabular component and femoral head to a ceramic-on- plastic. In addition, she has advanced osteoarthritis of her left hip and at this time, after failure of conservative treatment, she is scheduled for total hip arthroplasty of the left hip by anterior approach. The surgery risks, benefits, aftercare were discussed in detail with the patient, questions invited and answered. Due to the fact that she is having both lower extremities worked on at the same time, we will put her on Xarelto in the hospital and also for 12 days after discharge and then aspirin 325 one p.o. b.i.d. for another 4 weeks as DVT prophylaxis. The surgery risks, benefits, and aftercare were discussed in detail with the patient, questions invited and answered. Also note that the patient is a candidate for tranexamic acid and will receive that at preop. Also note that her medical doctor is Dr. Lucky Cowboy. She plans on going home after the surgery.  PCP: Nadean Corwin,  MD, MD   Discharged Condition: good  Hospital Course:  Patient underwent the above stated procedure on 10/22/2011. Patient tolerated the procedure well and brought to the recovery room in good condition and subsequently to the floor.  POD #1 BP: 119/68 ; Pulse: 97 ; Temp: 98.1 F (36.7 C) ; Resp: 16  Pt's foley was removed, as well as the hemovac drain removed. IV was changed to a saline lock.Patient reports pain as mild. No events. Neurovascular intact, dorsiflexion/plantar flexion intact, incision: dressing C/D/I, no cellulitis present and compartment soft.  LABS  Basename  10/23/11 0428   HGB  9.0*  HCT  26.8*     POD #2  BP: 137/71 ; Pulse: 95 ; Temp: 98.5 F (36.9 C) ; Resp: 16  Patient reports pain as mild. Some muscle spasms, but otherwise not a lot of pain. Otherwise no events. Ready to be discharged to home. Neurovascular intact, dorsiflexion/plantar flexion intact, incision: dressing C/D/I, no cellulitis present and compartment soft.  LABS  Basename  10/24/11 0438   HGB  7.8*  HCT  23.7*    Discharge Exam: General appearance: alert, cooperative and no distress Extremities: Homans sign is negative, no sign of DVT, no edema, redness or tenderness in the calves or thighs and no ulcers, gangrene or trophic changes  Disposition: Home-Health Care Svc  with follow up in 2 weeks  Follow-up Information    Follow up with OLIN,Crandall Harvel D in 2 weeks.   Contact information:   [facility] [address] [place] [address] [phone]          Discharge Orders  Future Orders Please Complete By Expires   Diet - low sodium heart healthy      Call MD / Call 911      Comments:   If you experience chest pain or shortness of breath, CALL 911 and be transported to the hospital emergency room.  If you develope a fever above 101 F, pus (white drainage) or increased drainage or redness at the wound, or calf pain, call your surgeon's  office.   Discharge instructions      Comments:   Maintain surgical dressings for 8 days, then replace with gauze and tape. Keep the area dry and clean until follow up. Follow up in 2 weeks at [facility]. Call with any questions or concerns.     Constipation Prevention      Comments:   Drink plenty of fluids.  Prune juice may be helpful.  You may use a stool softener, such as Colace (over the counter) 100 mg twice a day.  Use MiraLax (over the counter) for constipation as needed.   Increase activity slowly as tolerated      Weight Bearing as taught in Physical Therapy      Comments:   Use a walker or crutches as instructed.   Driving restrictions      Comments:   No driving for 4 weeks   Change dressing      Comments:   Maintain surgical dressings for 8 days, then replace with 4x4 guaze and tape. Keep the area dry and clean.   TED hose      Comments:   Use stockings (TED hose) for 2 weeks on both leg(s).  You may remove them at night for sleeping.      Current Discharge Medication List    START taking these medications   Details  docusate sodium 100 MG CAPS Take 100 mg by mouth 2 (two) times daily. Qty: 10 capsule    ferrous sulfate 325 (65 FE) MG tablet Take 1 tablet (325 mg total) by mouth 3 (three) times daily after meals.    HYDROcodone-acetaminophen (NORCO) 7.5-325 MG per tablet Take 1-2 tablets by mouth every 4 (four) hours as needed for pain. Qty: 120 tablet, Refills: 0    methocarbamol (ROBAXIN) 500 MG tablet Take 1 tablet (500 mg total) by mouth every 6 (six) hours as needed (muscle spasms).    polyethylene glycol (MIRALAX / GLYCOLAX) packet Take 17 g by mouth 2 (two) times daily.    rivaroxaban (XARELTO) 10 MG TABS tablet Take 1 tablet (10 mg total) by mouth daily with breakfast. Qty: 12 tablet      CONTINUE these medications which have CHANGED   Details  aspirin EC 325 MG tablet Take 1 tablet (325 mg total) by mouth daily. X 4 weeks Qty: 30  tablet, Refills: 0   Comments: For 4 weeks. After finishing the Xarelto.      CONTINUE these medications which have NOT CHANGED   Details  ALPRAZolam (XANAX) 0.5 MG tablet Take 0.25 mg by mouth at bedtime as needed.    calcium carbonate (OS-CAL) 600 MG TABS Take 600 mg by mouth 2 (two) times daily with a meal. Plus vitamin d 500    celecoxib (CELEBREX) 200 MG capsule Take 200 mg by mouth daily as needed.    cholecalciferol (VITAMIN D) 1000 UNITS tablet Take 1,000 Units by mouth 2 (two) times daily.    estradiol (ESTRACE) 1 MG tablet Take 0.5 mg by mouth at bedtime.    fish  oil-omega-3 fatty acids 1000 MG capsule Take 1 g by mouth 2 (two) times daily.    medroxyPROGESTERone (PROVERA) 2.5 MG tablet Take 1.25 mg by mouth daily.    moexipril-hydrochlorothiazide (UNIRETIC) 15-25 MG per tablet Take 0.5 tablets by mouth daily before breakfast.    Multiple Vitamin (MULITIVITAMIN WITH MINERALS) TABS Take 1 tablet by mouth daily.    pregabalin (LYRICA) 200 MG capsule Take 200 mg by mouth at bedtime.    pyridOXINE (VITAMIN B-6) 100 MG tablet Take 100 mg by mouth 2 (two) times daily.    Red Yeast Rice Extract (RED YEAST RICE PO) Take 2 capsules by mouth 2 (two) times daily.      STOP taking these medications     acetaminophen (TYLENOL) 500 MG tablet Comments:  Reason for Stopping:       HYDROcodone-acetaminophen (NORCO) 5-325 MG per tablet Comments:  Reason for Stopping:       naproxen sodium (ANAPROX) 220 MG tablet Comments:  Reason for Stopping:          Signed: Anastasio Auerbach. Manuella Blackson   PAC  10/24/2011, 8:13 AM

## 2011-10-24 NOTE — Progress Notes (Signed)
Patient discharged to home. DC instructions given with spouse at bed side. Prescription x 1 also given. No concerns voiced. Awaited and had lunch prior to DC. Left unit in wheelchair pushed by volunteer. No concerns voiced. Left unit in good  Condition.

## 2011-10-24 NOTE — Progress Notes (Signed)
Physical Therapy Treatment Patient Details Name: Gwendolyn [name] MRN: [id number] DOB: 07-03-1941 Today's Date: 10/24/2011  PT Assessment/Plan  PT - Assessment/Plan Comments on Treatment Session: Reviewed car transfer with pt and significant other PT Plan: Discharge plan remains appropriate PT Frequency: 7X/week Recommendations for Other Services: OT consult Follow Up Recommendations: Home health PT Equipment Recommended: None recommended by OT PT Goals  Acute Rehab PT Goals Time For Goal Achievement: 7 days Pt will go Supine/Side to Sit: with supervision PT Goal: Supine/Side to Sit - Progress: Met Pt will go Sit to Supine/Side: with supervision PT Goal: Sit to Supine/Side - Progress: Progressing toward goal Pt will go Sit to Stand: with supervision PT Goal: Sit to Stand - Progress: Progressing toward goal Pt will go Stand to Sit: with supervision PT Goal: Stand to Sit - Progress: Progressing toward goal Pt will Ambulate: 51 - 150 feet;with supervision;with rolling walker PT Goal: Ambulate - Progress: Progressing toward goal Pt will Go Up / Down Stairs: 1-2 stairs;with min assist;with least restrictive assistive device PT Goal: Up/Down Stairs - Progress: Met  PT Treatment Precautions/Restrictions  Precautions Precautions: Posterior Hip Precaution Comments: R side posterior precautions; no precautions on L; sign hung in room Restrictions Weight Bearing Restrictions: No Other Position/Activity Restrictions: WBAT Mobility (including Balance) Bed Mobility Supine to Sit: 5: Supervision Supine to Sit Details (indicate cue type and reason): min cues for sequence and THP on R Sit to Supine: 4: Min assist Sit to Supine - Details (indicate cue type and reason): min assist with LEs and min cues for sequence Transfers Sit to Stand: 5: Supervision Sit to Stand Details (indicate cue type and reason): min assist from EOB - Supervision with cues for LE position from chair Stand to  Sit: 5: Supervision;To bed;To chair/3-in-1;With armrests;With upper extremity assist Stand to Sit Details: cues for LE position and use of UEs Ambulation/Gait Ambulation/Gait Assistance: 5: Supervision Ambulation/Gait Assistance Details (indicate cue type and reason): cues for ER on R, position from RW and posture Ambulation Distance (Feet): 150 Feet Assistive device: Rolling walker Gait Pattern: Step-through pattern;Step-to pattern Stairs: Yes Stairs Assistance: 4: Min assist Stairs Assistance Details (indicate cue type and reason): cues for sequence and for foot placement Stair Management Technique: Backwards;With walker Number of Stairs: 1     Exercise  Total Joint Exercises Ankle Circles/Pumps: AROM;20 reps;Supine;Both Quad Sets: 10 reps;AROM;Both;Supine Gluteal Sets: AROM;10 reps;Supine;Both Heel Slides: AAROM;15 reps;Both;Supine Hip ABduction/ADduction: AAROM;15 reps;Supine;Both End of Session PT - End of Session Activity Tolerance: Patient tolerated treatment well Patient left: in chair;with call bell in reach;with family/visitor present Nurse Communication: Mobility status for transfers;Mobility status for ambulation General Behavior During Session: [facility] for tasks performed Cognition: [facility] for tasks performed  Gwendolyn [name] 10/24/2011, 3:57 PM

## 2011-10-24 NOTE — Evaluation (Signed)
Occupational Therapy Evaluation Patient Details Name: Gwendolyn [name] MRN: [id number] DOB: [birth year] Today's Date: 10/24/2011  Problem List: There is no problem list on file for this patient.   Past Medical History:  Past Medical History  Diagnosis Date  . Hypertension   . Arthritis   . Anemia   . 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 Pelvis fracture March 26, 2002    both sides fx   Past Surgical History:  Past Surgical History  Procedure Date  . Right shoulder humerus fx july 2003    surgery done  . Right hand ulner nerve surgery March 26, 2002    I and d done right hand/wrist  . Right elbow   surgery March 26, 2002  . Right wrist plating 2004  . Right total hip arthroplasty 2008  . 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 Tonsillectomy age 29    OT Assessment/Plan/Recommendation OT Assessment Clinical Impression Statement: This 71 year old female is s/p L anterior direct THA and R posterior THA revision.  She presents with THPs on the right side and needed min cues for these during the evaluation.  Both she and husband verbalize understanding.  No further OT needs nor DME as she has all of her equipment from previous sx.   OT Recommendation Equipment Recommended: None recommended by OT OT Goals    OT Evaluation Precautions/Restrictions  Precautions Precautions: Posterior Hip Precaution Comments: R side posterior precautions; no precautions on L; sign hung in room Restrictions Weight Bearing Restrictions: No Other Position/Activity Restrictions: WBAT Prior Functioning Home Living Bathroom Shower/Tub: Walk-in shower Bathroom Toilet: Standard (3:1 commode) Home Adaptive Equipment: Walker - rolling;Sock aid;Reacher;Long-handled shoehorn;Long-handled sponge;Bedside commode/3-in-1 Prior Function Level of Independence: Independent with basic ADLs;Independent with gait;Independent with transfers ADL ADL Grooming: Simulated;Supervision/safety Where  Assessed - Grooming: Standing at sink Upper Body Bathing: Simulated;Set up Where Assessed - Upper Body Bathing: Supported;Sitting, chair Lower Body Bathing: Simulated;Supervision/safety Where Assessed - Lower Body Bathing: Sit to stand from chair;Other (comment) (with AE) Upper Body Dressing: Simulated;Supervision/safety Where Assessed - Upper Body Dressing: Supported;Sitting, chair Lower Body Dressing: Performed;Other (comment);Minimal assistance (min A for shoes:  swelling esp R foot; pt performed pants/socks) Lower Body Dressing Details (indicate cue type and reason): 1 vc for thps when donning pants Where Assessed - Lower Body Dressing: Sit to stand from chair Toilet Transfer: Simulated;Supervision/safety Toilet Transfer Method: Proofreader:  Nurse, children's) Toileting - Clothing Manipulation: Supervision/safety;Simulated Where Assessed - Glass blower/designer Manipulation: Standing Toileting - Hygiene: Simulated;Supervision/safety Where Assessed - Toileting Hygiene: Standing Tub/Shower Transfer: Performed;Minimal assistance;Other (comment) (min guard) Tub/Shower Transfer Method: Science writer: Walk in Scientist, research (physical sciences) Used: Rolling walker;Reacher;Sock aid;Long-handled shoe horn Ambulation Related to ADLs: supervision; 1 vc for internal rotation ADL Comments: used AE:  min vcs for technique; thps Vision/Perception  Vision - History Patient Visual Report: No change from baseline Cognition Cognition Overall Cognitive Status: Appears within functional limits for tasks assessed Cognition - Other Comments: needs reinforcement with thps/adls verbalizes understanding Sensation/Coordination   Extremity Assessment RUE Assessment RUE Assessment: Within Functional Limits LUE Assessment LUE Assessment: Within Functional Limits Mobility  Transfers Sit to Stand: 5: Supervision Exercises   End of Session OT - End of Session Activity  Tolerance: Patient tolerated treatment well Patient left: in chair;with call bell in reach;with family/visitor present General Behavior During Session: [facility] for tasks performed Cognition: [facility] for tasks performed [facility], OTR/L [phone] 10/24/2011  Gwendolyn [name] 10/24/2011, 1:16 PM

## 2011-11-02 ENCOUNTER — Encounter (HOSPITAL_COMMUNITY): Payer: Self-pay | Admitting: Orthopedic Surgery

## 2012-03-11 ENCOUNTER — Other Ambulatory Visit: Payer: Self-pay | Admitting: Internal Medicine

## 2012-03-11 DIAGNOSIS — Z1231 Encounter for screening mammogram for malignant neoplasm of breast: Secondary | ICD-10-CM

## 2012-03-23 ENCOUNTER — Ambulatory Visit
Admission: RE | Admit: 2012-03-23 | Discharge: 2012-03-23 | Disposition: A | Discharge status: Home / Self Care | Payer: BC Managed Care – PPO | Source: Ambulatory Visit | Attending: Internal Medicine | Admitting: Internal Medicine

## 2012-03-23 DIAGNOSIS — Z1231 Encounter for screening mammogram for malignant neoplasm of breast: Secondary | ICD-10-CM

## 2012-03-25 ENCOUNTER — Other Ambulatory Visit: Payer: Self-pay | Admitting: Internal Medicine

## 2012-03-25 DIAGNOSIS — R928 Other abnormal and inconclusive findings on diagnostic imaging of breast: Secondary | ICD-10-CM

## 2012-03-31 ENCOUNTER — Ambulatory Visit
Admission: RE | Admit: 2012-03-31 | Discharge: 2012-03-31 | Disposition: A | Discharge status: Home / Self Care | Payer: BC Managed Care – PPO | Source: Ambulatory Visit | Attending: Internal Medicine | Admitting: Internal Medicine

## 2012-03-31 DIAGNOSIS — R928 Other abnormal and inconclusive findings on diagnostic imaging of breast: Secondary | ICD-10-CM

## 2012-09-12 ENCOUNTER — Other Ambulatory Visit: Payer: Self-pay | Admitting: Internal Medicine

## 2012-09-12 DIAGNOSIS — N6320 Unspecified lump in the left breast, unspecified quadrant: Secondary | ICD-10-CM

## 2012-10-03 ENCOUNTER — Ambulatory Visit
Admission: RE | Admit: 2012-10-03 | Discharge: 2012-10-03 | Disposition: A | Discharge status: Home / Self Care | Payer: BC Managed Care – PPO | Source: Ambulatory Visit | Attending: Internal Medicine | Admitting: Internal Medicine

## 2012-10-03 DIAGNOSIS — N6320 Unspecified lump in the left breast, unspecified quadrant: Secondary | ICD-10-CM

## 2013-01-06 ENCOUNTER — Other Ambulatory Visit (HOSPITAL_COMMUNITY): Payer: Self-pay | Admitting: Internal Medicine

## 2013-01-06 ENCOUNTER — Encounter (HOSPITAL_COMMUNITY): Payer: Self-pay | Admitting: Emergency Medicine

## 2013-01-06 ENCOUNTER — Other Ambulatory Visit: Payer: Self-pay

## 2013-01-06 ENCOUNTER — Ambulatory Visit (HOSPITAL_COMMUNITY)
Admission: RE | Admit: 2013-01-06 | Discharge: 2013-01-06 | Disposition: A | Discharge status: Home / Self Care | Payer: BC Managed Care – PPO | Source: Ambulatory Visit | Attending: Internal Medicine | Admitting: Internal Medicine

## 2013-01-06 ENCOUNTER — Emergency Department (HOSPITAL_COMMUNITY)
Admission: EM | Admit: 2013-01-06 | Discharge: 2013-01-06 | Disposition: A | Discharge status: Home / Self Care | Payer: BC Managed Care – PPO | Attending: Emergency Medicine | Admitting: Emergency Medicine

## 2013-01-06 ENCOUNTER — Emergency Department (HOSPITAL_COMMUNITY): Payer: BC Managed Care – PPO

## 2013-01-06 DIAGNOSIS — Z862 Personal history of diseases of the blood and blood-forming organs and certain disorders involving the immune mechanism: Secondary | ICD-10-CM | POA: Insufficient documentation

## 2013-01-06 DIAGNOSIS — I10 Essential (primary) hypertension: Secondary | ICD-10-CM | POA: Insufficient documentation

## 2013-01-06 DIAGNOSIS — Z8739 Personal history of other diseases of the musculoskeletal system and connective tissue: Secondary | ICD-10-CM | POA: Insufficient documentation

## 2013-01-06 DIAGNOSIS — Z87828 Personal history of other (healed) physical injury and trauma: Secondary | ICD-10-CM | POA: Insufficient documentation

## 2013-01-06 DIAGNOSIS — Z8781 Personal history of (healed) traumatic fracture: Secondary | ICD-10-CM | POA: Insufficient documentation

## 2013-01-06 DIAGNOSIS — Z79899 Other long term (current) drug therapy: Secondary | ICD-10-CM | POA: Insufficient documentation

## 2013-01-06 DIAGNOSIS — R0602 Shortness of breath: Secondary | ICD-10-CM | POA: Insufficient documentation

## 2013-01-06 DIAGNOSIS — R079 Chest pain, unspecified: Secondary | ICD-10-CM

## 2013-01-06 DIAGNOSIS — J40 Bronchitis, not specified as acute or chronic: Secondary | ICD-10-CM | POA: Insufficient documentation

## 2013-01-06 LAB — CBC
HCT: 38.3 % (ref 36.0–46.0)
Hemoglobin: 13.3 g/dL (ref 12.0–15.0)
MCH: 30.4 pg (ref 26.0–34.0)
MCHC: 34.7 g/dL (ref 30.0–36.0)

## 2013-01-06 LAB — BASIC METABOLIC PANEL
BUN: 31 mg/dL — ABNORMAL HIGH (ref 6–23)
Calcium: 9 mg/dL (ref 8.4–10.5)
GFR calc non Af Amer: 46 mL/min — ABNORMAL LOW (ref >90)
Glucose, Bld: 94 mg/dL (ref 70–99)
Sodium: 135 mEq/L (ref 135–145)

## 2013-01-06 MED ORDER — IOHEXOL 350 MG/ML SOLN
100.0000 mL | Freq: Once | INTRAVENOUS | Status: AC | PRN
  Administered 2013-01-06: 100 mL via INTRAVENOUS

## 2013-01-06 MED ORDER — SODIUM CHLORIDE 0.9 % IV BOLUS (SEPSIS)
500.0000 mL | Freq: Once | INTRAVENOUS | Status: AC
  Administered 2013-01-06: 500 mL via INTRAVENOUS

## 2013-01-06 NOTE — ED Provider Notes (Signed)
History     CSN: [id number]  Arrival date & time 01/06/13  1103   First MD Initiated Contact with Patient 01/06/13 1118      Chief Complaint  Patient presents with  . Shortness of Breath  . Chest Pain    (Consider location/radiation/quality/duration/timing/severity/associated sxs/prior treatment) HPI Comments: Patient presents after having abnormal labs performed by primary care physician. Patient with 1 week of cough and chest tightness. Yesterday she went to her primary care physician for evaluation of this and was started on Levaquin, given an inhaler and breathing treatment, given Tessalon. Today she was told to come to the emergency department because she had an elevated d-dimer as well as upper limit of normal creatinine and elevated BUN. Patient denies shortness of breath with activity or worsening exercise tolerance. She denies chest pain with inspiration, fever, lower extremity swelling. She is not a smoker. She takes provera and estradiol. No recent travel or immobilization. The onset of this condition was gradual. The course is constant. Aggravating factors: none. Alleviating factors: none.    The history is provided by the patient.    Past Medical History  Diagnosis Date  . Hypertension   . Arthritis   . Anemia   . 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 Pelvis fracture March 26, 2002    both sides fx    Past Surgical History  Procedure Laterality Date  . Right shoulder humerus fx  july 2003    surgery done  . Right hand ulner nerve surgery  March 26, 2002    I and d done right hand/wrist  . Right elbow   surgery  March 26, 2002  . Right wrist plating  2004  . Right total hip arthroplasty  2008  . 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 Tonsillectomy  age 42  . Total hip arthroplasty  10/22/2011    Procedure: TOTAL HIP ARTHROPLASTY ANTERIOR APPROACH;  Surgeon: Shelda Pal, MD;  Location: [facility];  Service: Orthopedics;  Laterality: Left;  Left Total Hip  Arthroplasty,  Anterior Approach     No family history on file.  History  Substance Use Topics  . Smoking status: Never Smoker   . Smokeless tobacco: Never Used  . Alcohol Use: Yes     Comment: social    OB History   Grav Para Term Preterm Abortions TAB SAB Ect Mult Living                  Review of Systems  Constitutional: Negative for fever.  HENT: Negative for sore throat and rhinorrhea.   Eyes: Negative for redness.  Respiratory: Positive for cough. Negative for shortness of breath.   Cardiovascular: Positive for chest pain (tightness, resolved). Negative for leg swelling.  Gastrointestinal: Negative for nausea, vomiting, abdominal pain and diarrhea.  Genitourinary: Negative for dysuria.  Musculoskeletal: Negative for myalgias.  Skin: Negative for rash.  Neurological: Negative for headaches.    Allergies  Oxycodone-acetaminophen; Ibuprofen; and Macrodantin  Home Medications   Current Outpatient Rx  Name  Route  Sig  Dispense  Refill  . ALPRAZolam (XANAX) 0.5 MG tablet   Oral   Take 0.25 mg by mouth at bedtime as needed.         . calcium carbonate (OS-CAL) 600 MG TABS   Oral   Take 600 mg by mouth 2 (two) times daily with a meal. Plus vitamin d 500         .  celecoxib (CELEBREX) 200 MG capsule   Oral   Take 200 mg by mouth daily as needed.         . cholecalciferol (VITAMIN D) 1000 UNITS tablet   Oral   Take 1,000 Units by mouth 2 (two) times daily.         Marland Kitchen estradiol (ESTRACE) 1 MG tablet   Oral   Take 0.5 mg by mouth at bedtime.         Marland Kitchen EXPIRED: ferrous sulfate 325 (65 FE) MG tablet   Oral   Take 1 tablet (325 mg total) by mouth 3 (three) times daily after meals.         . fish oil-omega-3 fatty acids 1000 MG capsule   Oral   Take 1 g by mouth 2 (two) times daily.         . medroxyPROGESTERone (PROVERA) 2.5 MG tablet   Oral   Take 1.25 mg by mouth daily.         . moexipril-hydrochlorothiazide (UNIRETIC) 15-25 MG per  tablet   Oral   Take 0.5 tablets by mouth daily before breakfast.         . Multiple Vitamin (MULITIVITAMIN WITH MINERALS) TABS   Oral   Take 1 tablet by mouth daily.         . pregabalin (LYRICA) 200 MG capsule   Oral   Take 200 mg by mouth at bedtime.         . pyridOXINE (VITAMIN B-6) 100 MG tablet   Oral   Take 100 mg by mouth 2 (two) times daily.         . Red Yeast Rice Extract (RED YEAST RICE PO)   Oral   Take 2 capsules by mouth 2 (two) times daily.         . rivaroxaban (XARELTO) 10 MG TABS tablet   Oral   Take 1 tablet (10 mg total) by mouth daily with breakfast.   12 tablet        BP 180/64  Pulse 70  Temp(Src) 97.5 F (36.4 C)  Resp 16  SpO2 99%  Physical Exam  Nursing note and vitals reviewed. Constitutional: She appears well-developed and well-nourished.  HENT:  Head: Normocephalic and atraumatic.  Eyes: Conjunctivae are normal. Right eye exhibits no discharge. Left eye exhibits no discharge.  Neck: Normal range of motion. Neck supple.  Cardiovascular: Normal rate, regular rhythm and normal heart sounds.   Pulmonary/Chest: Effort normal and breath sounds normal.  Abdominal: Soft. There is no tenderness.  Musculoskeletal: She exhibits no edema.  No LE edema.   Neurological: She is alert.  Skin: Skin is warm and dry.  Psychiatric: She has a normal mood and affect.    ED Course  Procedures (including critical care time)  Labs Reviewed  BASIC METABOLIC PANEL - Abnormal; Notable for the following:    BUN 31 (*)    Creatinine, Ser 1.17 (*)    GFR calc non Af Amer 46 (*)    GFR calc Af Amer 53 (*)    All other components within normal limits  CBC  POCT I-STAT TROPONIN I   Dg Chest 2 View  01/06/2013  *RADIOLOGY REPORT*  Clinical Data: Cough, shortness of breath  CHEST - 2 VIEW  Comparison: 10/19/2011  Findings: Cardiomediastinal silhouette is stable.  Stable old right rib fractures.  No acute infiltrate or pulmonary edema.  Mild  perihilar increased bronchial markings suspicious for bronchitic changes.  IMPRESSION: No acute infiltrate  or pulmonary edema.  Mild perihilar increased bronchial markings suspicious for bronchitic changes.   Original Report Authenticated By: Natasha Mead, M.D.    Ct Angio Chest Pe W/cm &/or Wo Cm  01/06/2013  *RADIOLOGY REPORT*  Clinical Data: Cough, elevated D-dimer  CT ANGIOGRAPHY CHEST  Technique:  Multidetector CT imaging of the chest using the standard protocol during bolus administration of intravenous contrast. Multiplanar reconstructed images including MIPs were obtained and reviewed to evaluate the vascular anatomy.  Contrast: OMNIPAQUE IOHEXOL 350 MG/ML SOLN  Comparison: 01/06/2013 chest radiograph  Findings: Multiple rib fractures are noted bilaterally in variable stages of healing, with callus formation noted at multiple areas indicating at least subacute timing.  No pneumothorax.  Minimal dependent bibasilar atelectasis is present.  Central airways are patent.  Small pretracheal lymph nodes are noted, 1.0 cm maximally image 24. Representative AP window lymph node 8 mm image 25.  Great vessels are normal in caliber.  Heart size is normal.  No pericardial or pleural effusion.  The study is of adequate technical quality for evaluation for pulmonary embolism up to and including the 3rd order pulmonary arteries. No focal filling defect is seen to suggest acute pulmonary embolism.  IMPRESSION: No acute cardiopulmonary process.  Specifically, no filling defect is seen up to and including the 3rd order pulmonary arteries to suggest pulmonary embolism.  Multiple bilateral rib fractures of varying ages, at least subacute to remote.   Original Report Authenticated By: Christiana Pellant, M.D.      1. Bronchitis     11:38 AM Patient seen and examined.    Vital signs reviewed and are as follows: Filed Vitals:   01/06/13 1111  BP: 180/64  Pulse: 70  Temp: 97.5 F (36.4 C)  Resp: 16    Date:  01/06/2013  Rate: 76  Rhythm: normal sinus rhythm  QRS Axis: normal  Intervals: normal  ST/T Wave abnormalities: normal  Conduction Disutrbances:none  Narrative Interpretation:   Old EKG Reviewed: none available  CT findings reviewed by myself. Patient states that she has had remote rib fractures but nothing recent. She denies point tenderness in her chest.  Patient encouraged to continue symptomatic treatments provided by her primary care physician and to followup for evaluation of renal function, elevated d-dimer and CRP.  She verbalizes understanding and agrees with plan.  Patient urged to return with worsening chest pain, shortness of breath, fever, or she has any other concerns.  BP 157/63  Pulse 73  Temp(Src) 97.5 F (36.4 C)  Resp 19  SpO2 98%   MDM  Elevated d-dimer in setting of cough. Patient does not portray a good picture for pulmonary embolism. She has no exertional shortness of breath, pleuritic chest pain, tachycardia. CT performed and was negative. Patient has upper limit of normal/mildly elevated BUN/creatinine. She also has elevated CRP d-dimer. These can be followed by her primary care physician. Patient appears to have bronchitis with intermittent wheezing. She has been provided supportive therapy by her primary care physician. She will continue antibiotics. Vital signs are stable, hypertension noted, improved at discharge. Normal oxygen saturation. Patient safe for discharge home.       Renne Crigler, PA-C 01/06/13 1614

## 2013-01-06 NOTE — Discharge Instructions (Signed)
Please read and follow all provided instructions.  Your diagnoses today include:  1. Bronchitis     Tests performed today include:  Chest CT - does not show any pneumonia or blood clots  Blood counts and electrolytes - mildly high BUN/creatinine  Vital signs. See below for your results today.   Medications prescribed:   None  Take medications prescribed by your physician for treatment of bronchitis.   Home care instructions:  Follow any educational materials contained in this packet.  Follow-up instructions: Please follow-up with your primary care provider in the next 3 days for further evaluation of your symptoms and a recheck if you are not feeling better. If you do not have a primary care doctor -- see below for referral information.   You will also need to follow-up regarding elevated d-dimer, c-reactive protein, and renal function.  Return instructions:   Please return to the Emergency Department if you experience worsening symptoms.  Please return with worsening wheezing, shortness of breath, or difficulty breathing.  Return with persistent fever above 101F.   Please return if you have any other emergent concerns.  Additional Information:  Your vital signs today were: BP 157/63   Pulse 73   Temp(Src) 97.5 F (36.4 C)   Resp 19   SpO2 98% If your blood pressure (BP) was elevated above 135/85 this visit, please have this repeated by your doctor within one month. -------------- RESOURCE GUIDE  Chronic Pain Problems:  [facility]:  [phone]  Patients need to be referred by their primary care doctor  Insufficient Money for Medicine:  [employer]:     call "211"  [facility]:  [phone]  No Primary Care Doctor:  To locate a primary care doctor that accepts your insurance or provides certain services:  [facility] :   [phone]  Physician Referral Service:  [phone]  Agencies that provide inexpensive medical  care:  [facility]:   [phone]  [facility]:   [phone]  [facility]:   [phone]  [facility]:     [phone]  Planned Parenthood:    [phone]  [facility]:     [phone]  Medicaid-accepting [facility] Providers:  [facility]  [address], [phone], Mon-Fri 9am-7pm, Sat 9am-1pm  [facility]  [address] [place], [address] [place], [phone]  [facility]  [address], Suite [place], [phone]  [facility]  [address] [phone]  Renaye Rakers  [address], [phone]  Only accepts [place] [employer] patients after they have their name  applied to their card  Self Pay (no insurance) in [facility]:  Sickle Cell Patients: Dr. Willey Blade, [facility]  [address] [place], [phone]  [facility]  [address] [place], [phone]  [facility] [place]  [facility] [address], [address], [facility] - [address]  [phone], Mon-Fri 9am-7pm, Sat 9am-1pm  [facility]  [address] [place], [phone]  [facility]  [address], [phone]  [facility]  [address] [phone]  Dr. Julio Sicks  [address], [place], [phone]  [facility]  [address], [phone]  [facility]  [address], [phone]  [address] [place]  [address] [phone]  [facility]  [address] [place], [phone], 1st & 3rd Saturday every month, 10am-1pm  Strategies for finding a Primary Care Provider:  1) Find a Doctor and Pay Out of Pocket Although you won't have to find out who is covered by your insurance plan, it is a good idea to ask around and get recommendations. You will then need to call the office and see if the  doctor you have chosen will accept you as a new patient and what types of options they offer for patients who are self-pay. Some doctors offer discounts or will set up payment plans for their patients who do not have insurance, but you will need to ask so you aren't surprised when you get to your appointment.  2) Contact Your Local Health Department Not all health departments have doctors that can see patients for sick visits, but many do, so it is worth a call to see if yours does. If you don't know where your local health department is, you can check in your phone book. The CDC also has a tool to help you locate your state's health department, and many state websites also have listings of all of their local health departments.  3) Find a Walk-in Clinic If your illness is not likely to be very severe or complicated, you may want to try a walk in clinic. These are popping up all over the country in pharmacies, drugstores, and shopping centers. They're usually staffed by nurse practitioners or physician assistants that have been trained to treat common illnesses and complaints. They're usually fairly quick and inexpensive. However, if you have serious medical issues or chronic medical problems, these are probably not your best option  STD Testing:  [facility] [place], [place] [facility]  [address], [place], phone [phone] or [phone]    Monday - Friday, call for an appointment  [facility] Department of [facility], [place] [facility]  [address], [place], phone [phone] or [phone]   Monday - Friday, call for an appointment  Abuse/Neglect:  [facility] Child Abuse Hotline:  508-381-1135  [facility] Child Abuse Hotline: [phone] (After Hours)  Emergency Shelter:  [facility] [phone]  Maternity Homes:  [facility]: 613-795-3027  [facility]: [phone]  MRSA Hotline:   703-488-6397   [facility]  [address] [place]  [phone]   [place]  [address], [place]  [phone]   [facility] Dept.  [address], [place]  [phone]   [facility]  [phone]   [facility]  [phone]   [facility] in [place]  [address]  [phone], [facility] Child Abuse Hotline  229-727-4711  [phone] (After Hours)   Behavioral Health Services  Substance Abuse Resources:  [facility] Drug Services:     2035280390  [facility]:   [phone]  [facility]:     [phone]  [facility]:      [phone]  Residential & Outpatient Substance Abuse Program: [phone]  Psychological Services:  [facility]:   [phone]  [facility] Services:    [phone]  [facility] Mental Health  [address], [place]  ACCESS LINE: [phone] or [phone]  [employer]  Dental Assistance: If  unable to pay or uninsured, contact:  [facility] or [facility]. to become qualified for the adult dental clinic.  Patients with Medicaid  [facility]  [phone] [address], [phone]  [address], [phone]  If unable to pay, or uninsured: contact [facility] [phone]) or [facility] Department [phone] in [place], [phone] in [facility]) to become qualified for the adult dental clinic  Other Low-Cost Community Dental Services:  [facility]  [address] [place], [place], [place], [address]  [phone], Ext. [phone]  2nd and 4th Thursday of the month at [facility]  [facility]  [address] [place], [place], [place], [address]  [phone]  [facility]  [address] [place], [place], [address]  [phone]  [facility] Health Department  [phone]  [facility] Health Department  [phone]  [facility] Health Department  [phone]

## 2013-01-06 NOTE — ED Notes (Signed)
Triage ekg given to dr linker

## 2013-01-06 NOTE — ED Notes (Signed)
Had some chest tightness for the past few days  ,saw dr yesterday and had labs drawn given breathing tx and inhaler and antiobiotics and went  And [facility]  [facility] she was called and told that her labs were abnormal for kidney issues and ? Clot in lung

## 2013-01-06 NOTE — ED Provider Notes (Signed)
Medical screening examination/treatment/procedure(s) were performed by non-physician practitioner and as supervising physician I was immediately available for consultation/collaboration.  Ethelda Chick, MD 01/06/13 [phone]

## 2013-01-06 NOTE — ED Notes (Signed)
HAS HAD AWFUL COUGH AND CHEST PRESSURE ALSO WHICH IS WHY SHE WENT TO DR IN THE FIRST PLACE

## 2013-04-11 ENCOUNTER — Other Ambulatory Visit: Payer: Self-pay | Admitting: Internal Medicine

## 2013-04-11 DIAGNOSIS — N6320 Unspecified lump in the left breast, unspecified quadrant: Secondary | ICD-10-CM

## 2013-04-27 ENCOUNTER — Ambulatory Visit
Admission: RE | Admit: 2013-04-27 | Discharge: 2013-04-27 | Disposition: A | Discharge status: Home / Self Care | Payer: BC Managed Care – PPO | Source: Ambulatory Visit | Attending: Internal Medicine | Admitting: Internal Medicine

## 2013-04-27 DIAGNOSIS — N6320 Unspecified lump in the left breast, unspecified quadrant: Secondary | ICD-10-CM

## 2013-08-03 ENCOUNTER — Encounter: Payer: Self-pay | Admitting: Internal Medicine

## 2013-08-08 ENCOUNTER — Encounter: Payer: Self-pay | Admitting: Internal Medicine

## 2013-08-08 ENCOUNTER — Ambulatory Visit (INDEPENDENT_AMBULATORY_CARE_PROVIDER_SITE_OTHER): Payer: BC Managed Care – PPO | Admitting: Internal Medicine

## 2013-08-08 VITALS — BP 126/64 | HR 64 | Temp 97.5°F | Resp 18 | Ht 64.0 in | Wt 180.6 lb

## 2013-08-08 DIAGNOSIS — Z79899 Other long term (current) drug therapy: Secondary | ICD-10-CM

## 2013-08-08 DIAGNOSIS — I10 Essential (primary) hypertension: Secondary | ICD-10-CM | POA: Insufficient documentation

## 2013-08-08 DIAGNOSIS — R7309 Other abnormal glucose: Secondary | ICD-10-CM | POA: Insufficient documentation

## 2013-08-08 DIAGNOSIS — E559 Vitamin D deficiency, unspecified: Secondary | ICD-10-CM | POA: Insufficient documentation

## 2013-08-08 DIAGNOSIS — E782 Mixed hyperlipidemia: Secondary | ICD-10-CM | POA: Insufficient documentation

## 2013-08-08 LAB — CBC WITH DIFFERENTIAL/PLATELET
Basophils Relative: 1 % (ref 0–1)
Eosinophils Absolute: 0.3 10*3/uL (ref 0.0–0.7)
HCT: 38 % (ref 36.0–46.0)
Hemoglobin: 13 g/dL (ref 12.0–15.0)
MCH: 29.7 pg (ref 26.0–34.0)
MCHC: 34.2 g/dL (ref 30.0–36.0)
Monocytes Absolute: 1 10*3/uL (ref 0.1–1.0)
Monocytes Relative: 12 % (ref 3–12)

## 2013-08-08 MED ORDER — PREGABALIN 150 MG PO CAPS: 150.0000 mg | ORAL_CAPSULE | Freq: Two times a day (BID) | ORAL | Status: DC

## 2013-08-08 MED ORDER — PREGABALIN 150 MG PO CAPS: ORAL_CAPSULE | ORAL | Status: DC

## 2013-08-08 NOTE — Progress Notes (Signed)
Patient ID: Gwendolyn [name], female   DOB: 06-24-1941, 72 y.o.   MRN: [id number]   This very nice 72 yo [name] presents for 3 month follow up with Hypertension, Hyperlipidemia, Pre-Diabetes and Vitamin D Deficiency.    BP has been controlled at home. Today's BP is 126/64. Patient denies any cardiac type chest pain, palpitations, dyspnea/orthopnea/PND, dizziness, claudication, or dependent edema.   Hyperlipidemia is controlled with diet & RYRE. Last Cholesterol was 186, Triglycerides were 132, HDL 52 and LDL 108 in June - near goal. Patient denies myalgias or other med SE's.    Also, the patient has history of PreDiabetes with last A1c of correcting from 5.7% to 5.5% in June. Patient denies any symptoms of reactive hypoglycemia, diabetic polys, paresthesias or visual blurring.   Further, Patient has history of Vitamin D Deficiency with last vitamin D of 79 in June. Patient supplements vitamin D without any suspected side-effects.  Medication Sig Dispense Refill  . ALPRAZolam (XANAX) 0.5 MG tablet Take 0.25 mg by mouth at bedtime as needed.      Marland Kitchen aspirin EC 81 MG tablet Take 162 mg by mouth at bedtime.      . cholecalciferol (VITAMIN D) 1000 UNITS tablet Take 1,000 Units by mouth 2 (two) times daily.      Marland Kitchen estradiol (ESTRACE) 1 MG tablet Take 0.5 mg by mouth at bedtime.      . fish oil-omega-3 fatty acids 1000 MG capsule Take 1 g by mouth 2 (two) times daily.      . furosemide (LASIX) 40 MG tablet Take 40 mg by mouth every other day.      Marland Kitchen LOSARTAN POTASSIUM PO Take 1 tablet by mouth daily.      Marland Kitchen losartan-hydrochlorothiazide (HYZAAR) 50-12.5 MG per tablet Take 1 tablet by mouth daily.      . medroxyPROGESTERone (PROVERA) 2.5 MG tablet Take 1.25 mg by mouth daily.      . Multiple Vitamin (MULITIVITAMIN WITH MINERALS) TABS Take 1 tablet by mouth daily.      . pregabalin (LYRICA) 200 MG capsule Take 200 mg by mouth at bedtime.      . pyridOXINE (VITAMIN B-6) 100 MG tablet Take 100 mg by mouth 2  (two) times daily.      . Red Yeast Rice Extract (RED YEAST RICE PO) Take 2 capsules by mouth 2 (two) times daily.         Allergies  Allergen Reactions  . Oxycodone-Acetaminophen Other (See Comments)    panic attack  . Vit C-Cholecalciferol-Rose Hip [Cholecalciferol-Vitamin C] Other (See Comments)    dyspepsia  . Ibuprofen [Ibuprofen] Rash  . Macrodantin [Nitrofurantoin Macrocrystal] Rash    PMHx:   Past Medical History  Diagnosis Date  . Hypertension   . Arthritis   . Anemia   . 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 Pelvis fracture March 26, 2002    both sides fx  . Hyperlipidemia   . Pre-diabetes   . Vitamin D deficiency     FHx:    Reviewed / unchanged  SHx:    Reviewed / unchanged  Systems Review: Constitutional: Denies fever, chills, wt changes, headaches, insomnia, fatigue, night sweats, change in appetite. Eyes: Denies redness, blurred vision, diplopia, discharge, itchy, watery eyes.  ENT: Denies discharge, congestion, post nasal drip, epistaxis, sore throat, earache, hearing loss, dental pain, tinnitus, vertigo, sinus pain, snoring.  CV: Denies chest pain, palpitations, irregular heartbeat, syncope, dyspnea, diaphoresis, orthopnea, PND, claudication,  edema. Respiratory: denies cough, dyspnea, DOE, pleurisy, hoarseness, laryngitis, wheezing.  Gastrointestinal: Denies dysphagia, odynophagia, heartburn, reflux, water brash, abdominal pain or cramps, nausea, vomiting, bloating, diarrhea, constipation, hematemesis, melena, hematochezia,  or hemorrhoids. Genitourinary: Denies dysuria, frequency, urgency, nocturia, hesitancy, discharge, hematuria, flank pain. Musculoskeletal: Denies arthralgias, myalgias, stiffness, jt. swelling, pain, limp, strain/sprain.  Skin: Denies pruritus, rash, hives, warts, acne, eczema, change in skin lesion(s). Neuro: No weakness, tremor, incoordination, spasms, paresthesia, or pain. Psychiatric: Denies confusion, memory loss, or  sensory loss. Endo: Denies change in weight, skin, hair change.  Heme/Lymph: No excessive bleeding, bruising, orenlarged lymph nodes.  Filed Vitals:   08/08/13 1504  BP: 126/64  Pulse: 64  Temp: 97.5 F (36.4 C)  Resp: 18    Estimated body mass index is 30.98 kg/(m^2) as calculated from the following:   Height as of this encounter: 5\' 4"  (1.626 m).   Weight as of this encounter: 180 lb 9.6 oz (81.92 kg).  On Exam: Appears well nourished - in no distress. Eyes: PERRLA, EOMs, conjunctiva no swelling or erythema. Sinuses: No frontal/maxillary tenderness ENT/Mouth: EAC's clear, TM's nl w/o erythema, bulging. Nares clear w/o erythema, swelling, exudates. Oropharynx clear without erythema or exudates. Oral hygiene is good. Tongue normal, non obstructing. Hearing intact.  Neck: Supple. Thyroid nl. Car 2+/2+ without bruits, nodes or JVD. Chest: Respirations nl with BS clear & equal w/o rales, rhonchi, wheezing or stridor.  Cor: Heart sounds normal w/ regular rate and rhythm without sig. murmurs, gallops, clicks, or rubs. Peripheral pulses normal and equal  without edema.  Abdomen: Soft & bowel sounds normal. Non-tender w/o guarding, rebound, hernias, masses, or organomegaly.  Lymphatics: Unremarkable.  Musculoskeletal: Full ROM all peripheral extremities, joint stability, 5/5 strength, and normal gait.  Skin: Warm, dry without exposed rashes, lesions, ecchymosis apparent.  Neuro: Cranial nerves intact, reflexes equal bilaterally. Sensory-motor testing grossly intact. Tendon reflexes grossly intact.  Pysch: Alert & oriented x 3. Insight and judgement nl & appropriate. No ideations.  Assessment and Plan:  1. Hypertension - Continue monitor blood pressure at home. Continue diet/meds same.  2. Hyperlipidemia - Continue diet/meds, exercise,& lifestyle modifications. Continue monitor periodic cholesterol/liver & renal functions   3. Pre-diabetes - improved -  Continue diet, exercise,  lifestyle modifications. Monitor appropriate labs.  4. Vitamin D Deficiency - Continue supplementation.  Recommended regular exercise, BP monitoring, weight control, and discussed med and SE's. Recommended labs to assess and monitor clinical status. Further disposition pending results of labs.

## 2013-08-08 NOTE — Patient Instructions (Signed)

## 2013-08-09 LAB — BASIC METABOLIC PANEL WITH GFR
BUN: 26 mg/dL — ABNORMAL HIGH (ref 6–23)
Calcium: 9.6 mg/dL (ref 8.4–10.5)
GFR, Est African American: 80 mL/min
Glucose, Bld: 81 mg/dL (ref 70–99)

## 2013-08-09 LAB — MAGNESIUM: Magnesium: 2 mg/dL (ref 1.5–2.5)

## 2013-08-09 LAB — VITAMIN D 25 HYDROXY (VIT D DEFICIENCY, FRACTURES): Vit D, 25-Hydroxy: 91 ng/mL — ABNORMAL HIGH (ref 30–89)

## 2013-08-09 LAB — HEPATIC FUNCTION PANEL
ALT: 27 U/L (ref 0–35)
Albumin: 4.3 g/dL (ref 3.5–5.2)
Total Protein: 7.1 g/dL (ref 6.0–8.3)

## 2013-08-09 LAB — HEMOGLOBIN A1C
Hgb A1c MFr Bld: 5.7 % — ABNORMAL HIGH (ref <5.7)
Mean Plasma Glucose: 117 mg/dL — ABNORMAL HIGH (ref <117)

## 2013-08-09 LAB — INSULIN, FASTING: Insulin fasting, serum: 9 u[IU]/mL (ref 3–28)

## 2013-08-09 LAB — LIPID PANEL: Cholesterol: 201 mg/dL — ABNORMAL HIGH (ref 0–200)

## 2013-08-10 ENCOUNTER — Other Ambulatory Visit: Payer: Self-pay | Admitting: Internal Medicine

## 2013-11-07 ENCOUNTER — Ambulatory Visit (INDEPENDENT_AMBULATORY_CARE_PROVIDER_SITE_OTHER): Payer: Medicare Other | Admitting: Emergency Medicine

## 2013-11-07 ENCOUNTER — Encounter: Payer: Self-pay | Admitting: Emergency Medicine

## 2013-11-07 VITALS — BP 108/68 | HR 70 | Temp 98.0°F | Resp 18 | Ht 63.5 in | Wt 182.0 lb

## 2013-11-07 DIAGNOSIS — R7309 Other abnormal glucose: Secondary | ICD-10-CM

## 2013-11-07 DIAGNOSIS — E782 Mixed hyperlipidemia: Secondary | ICD-10-CM

## 2013-11-07 DIAGNOSIS — I10 Essential (primary) hypertension: Secondary | ICD-10-CM

## 2013-11-07 LAB — CBC WITH DIFFERENTIAL/PLATELET
Basophils Absolute: 0.1 10*3/uL (ref 0.0–0.1)
Basophils Relative: 1 % (ref 0–1)
Eosinophils Absolute: 0.1 10*3/uL (ref 0.0–0.7)
Eosinophils Relative: 2 % (ref 0–5)
HCT: 36.7 % (ref 36.0–46.0)
Hemoglobin: 12.5 g/dL (ref 12.0–15.0)
LYMPHS ABS: 1.9 10*3/uL (ref 0.7–4.0)
LYMPHS PCT: 32 % (ref 12–46)
MCH: 29.8 pg (ref 26.0–34.0)
MCHC: 34.1 g/dL (ref 30.0–36.0)
MCV: 87.4 fL (ref 78.0–100.0)
MONOS PCT: 11 % (ref 3–12)
Monocytes Absolute: 0.6 10*3/uL (ref 0.1–1.0)
NEUTROS ABS: 3.2 10*3/uL (ref 1.7–7.7)
NEUTROS PCT: 54 % (ref 43–77)
Platelets: 291 10*3/uL (ref 150–400)
RBC: 4.2 MIL/uL (ref 3.87–5.11)
RDW: 14.5 % (ref 11.5–15.5)
WBC: 5.9 10*3/uL (ref 4.0–10.5)

## 2013-11-07 LAB — HEMOGLOBIN A1C
HEMOGLOBIN A1C: 5.8 % — AB (ref <5.7)
MEAN PLASMA GLUCOSE: 120 mg/dL — AB (ref <117)

## 2013-11-07 NOTE — Patient Instructions (Signed)
We want weight loss that will last so you should lose 1-2 pounds a week.  THAT IS IT! Please pick THREE things a month to change. Once it is a habit check off the item. Then pick another three items off the list to become habits.  If you are already doing a habit on the list GREAT!  Cross that item off! o Don't drink your calories. Ie, alcohol, soda, fruit juice, and sweet tea.  o Drink more water. Drink a glass when you feel hungry or before each meal.  o Eat breakfast - Complex carb and protein (likeDannon light and fit yogurt, oatmeal, fruit, eggs, turkey bacon). o Measure your cereal.  Eat no more than one cup a day. (ie Kashi) o Eat an apple a day. o Add a vegetable a day. o Try a new vegetable a month. o Use Pam! Stop using oil or butter to cook. o Don't finish your plate or use smaller plates. o Share your dessert. o Eat sugar free Jello for dessert or frozen grapes. o Don't eat 2-3 hours before bed. o Switch to whole wheat bread, pasta, and brown rice. o Make healthier choices when you eat out. No fries! o Pick baked chicken, NOT fried. o Don't forget to SLOW DOWN when you eat. It is not going anywhere.  o Take the stairs. o Park far away in the parking lot o Lift soup cans (or weights) for 10 minutes while watching TV. o Walk at work for 10 minutes during break. o Walk outside 1 time a week with your friend, kids, dog, or significant other. o Start a walking group at church. o Walk the mall as much as you can tolerate.  o Keep a food diary. o Weigh yourself daily. o Walk for 15 minutes 3 days per week. o Cook at home more often and eat out less.  If life happens and you go back to old habits, it is okay.  Just start over. You can do it!   If you experience chest pain, get short of breath, or tired during the exercise, please stop immediately and inform your doctor.     Bad carbs also include fruit juice, alcohol, and sweet tea. These are empty calories that do not signal to  your brain that you are full.   Please remember the good carbs are still carbs which convert into sugar. So please measure them out no more than 1/2-1 cup of rice, oatmeal, pasta, and beans.  Veggies are however free foods! Pile them on.   I like lean protein at every meal such as chicken, turkey, pork chops, cottage cheese, etc. Just do not fry these meats and please center your meal around vegetable, the meats should be a side dish.   No all fruit is created equal. Please see the list below, the fruit at the bottom is higher in sugars than the fruit at the top    

## 2013-11-07 NOTE — Progress Notes (Signed)
Subjective:    Patient ID: Gwendolyn [name], female    DOB: [birth year], 73 y.o.   MRN: [id number]  HPI Comments: 73 yo female presents for 3 month F/U for HTN, Cholesterol, Pre-Dm, D. Deficient. She is only exercising with work. She is eating descent. She notes BP good. She has occasional LE edema at the end of he shift but resolves with elevation. Her partner had recent aortic arch aneurysm and she wants to verify if she has had testing in past. She denies any current symptoms. She has mild fatigue only after working for long hours.  CHOL         201   08/08/2013 HDL           56   08/08/2013 LDLCALC      127   08/08/2013 TRIG          88   08/08/2013 CHOLHDL      3.6   08/08/2013 ALT           27   08/08/2013 AST           28   08/08/2013 ALKPHOS      108   08/08/2013 BILITOT      0.4   08/08/2013 CREATININE     0.85   08/08/2013 BUN              26   08/08/2013 NA              137   08/08/2013 K               3.8   08/08/2013 CL               96   08/08/2013 CO2              30   08/08/2013 HGBA1C      5.7   08/08/2013 She has mild allergy drainage that remains clear but has increased over last week.   Hypertension    Current Outpatient Prescriptions on File Prior to Visit  Medication Sig Dispense Refill  . ALPRAZolam (XANAX) 0.5 MG tablet Take 0.25 mg by mouth at bedtime as needed.      Marland Kitchen aspirin EC 81 MG tablet Take 162 mg by mouth at bedtime.      . cholecalciferol (VITAMIN D) 1000 UNITS tablet Take 1,000 Units by mouth 2 (two) times daily.      Marland Kitchen CINNAMON PO Take 500 mg by mouth 2 (two) times daily.      Marland Kitchen estradiol (ESTRACE) 1 MG tablet TAKE 1 TABLET EVERY DAY  90 tablet  1  . fish oil-omega-3 fatty acids 1000 MG capsule Take 1 g by mouth 2 (two) times daily.      . Flaxseed, Linseed, (FLAXSEED OIL PO) Take 600 mg by mouth daily.      . furosemide (LASIX) 40 MG tablet Take 40 mg by mouth every other day.      Marland Kitchen LOSARTAN POTASSIUM PO Take 1 tablet by mouth daily.      Marland Kitchen  losartan-hydrochlorothiazide (HYZAAR) 50-12.5 MG per tablet Take 1 tablet by mouth daily.      . medroxyPROGESTERone (PROVERA) 2.5 MG tablet TAKE 1 TABLET EVERY DAY  90 tablet  1  . Multiple Vitamin (MULITIVITAMIN WITH MINERALS) TABS Take 1 tablet by mouth daily.      . pregabalin (LYRICA) 150 MG capsule Take 1 capsule 3 x daily for neuralgia pain  270 capsule  1  .  pyridOXINE (VITAMIN B-6) 100 MG tablet Take 100 mg by mouth 2 (two) times daily.      . Red Yeast Rice Extract (RED YEAST RICE PO) Take 2 capsules by mouth 2 (two) times daily.       No current facility-administered medications on file prior to visit.   Allergies  Allergen Reactions  . Oxycodone-Acetaminophen Other (See Comments)    panic attack  . Vit C-Cholecalciferol-Rose Hip [Cholecalciferol-Vitamin C] Other (See Comments)    dyspepsia  . Ibuprofen [Ibuprofen] Rash  . Macrodantin [Nitrofurantoin Macrocrystal] Rash   Past Medical History  Diagnosis Date  . Hypertension   . Arthritis   . Anemia   . 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 Pelvis fracture March 26, 2002    both sides fx  . Hyperlipidemia   . Pre-diabetes   . Vitamin D deficiency      Review of Systems  Constitutional: Positive for fatigue.  HENT: Positive for postnasal drip.   All other systems reviewed and are negative.   BP 108/68  Pulse 70  Temp(Src) 98 F (36.7 C) (Temporal)  Resp 18  Ht 5' 3.5" (1.613 m)  Wt 182 lb (82.555 kg)  BMI 31.73 kg/m2     Objective:   Physical Exam  Nursing note and vitals reviewed. Constitutional: She is oriented to person, place, and time. She appears well-developed and well-nourished. No distress.  HENT:  Head: Normocephalic and atraumatic.  Right Ear: External ear normal.  Left Ear: External ear normal.  Nose: Nose normal.  Mouth/Throat: Oropharynx is clear and moist.  Cobblestones posterior pharynx   Eyes: Conjunctivae and EOM are normal.  Neck: Normal range of motion. Neck supple. No  JVD present. No thyromegaly present.  Cardiovascular: Normal rate, regular rhythm, normal heart sounds and intact distal pulses.   Pulmonary/Chest: Effort normal and breath sounds normal.  Abdominal: Soft. Bowel sounds are normal. She exhibits no distension and no mass. There is no tenderness. There is no rebound and no guarding.  Musculoskeletal: Normal range of motion. She exhibits no edema and no tenderness.  Lymphadenopathy:    She has no cervical adenopathy.  Neurological: She is alert and oriented to person, place, and time. No cranial nerve deficit.  Skin: Skin is warm and dry. No rash noted. No erythema. No pallor.  Psychiatric: She has a normal mood and affect. Her behavior is normal. Judgment and thought content normal.          Assessment & Plan:  1.  3 month F/U for HTN, Cholesterol, Pre-Dm, D. Deficient. Needs healthy diet, cardio QD and obtain healthy weight. Check Labs, Check BP if >130/80 call office  2. Long discussion about aortic aneurysm with neg CT Chest and abd U/S in past, advised with any concerns can get repeat abdomen scan, pt declines currently and exam NEG today with Auscultation  3. Allergic rhinitis- Allegra OTC, increase H2o, allergy hygiene explained.

## 2013-11-08 LAB — BASIC METABOLIC PANEL WITH GFR
BUN: 27 mg/dL — ABNORMAL HIGH (ref 6–23)
CO2: 29 meq/L (ref 19–32)
Calcium: 8.8 mg/dL (ref 8.4–10.5)
Chloride: 97 mEq/L (ref 96–112)
Creat: 0.87 mg/dL (ref 0.50–1.10)
GFR, Est African American: 78 mL/min
GFR, Est Non African American: 67 mL/min
GLUCOSE: 79 mg/dL (ref 70–99)
Potassium: 3.6 mEq/L (ref 3.5–5.3)
SODIUM: 135 meq/L (ref 135–145)

## 2013-11-08 LAB — LIPID PANEL
Cholesterol: 166 mg/dL (ref 0–200)
HDL: 53 mg/dL (ref >39)
LDL Cholesterol: 96 mg/dL (ref 0–99)
Total CHOL/HDL Ratio: 3.1 Ratio
Triglycerides: 87 mg/dL (ref <150)
VLDL: 17 mg/dL (ref 0–40)

## 2013-11-08 LAB — HEPATIC FUNCTION PANEL
ALBUMIN: 4.3 g/dL (ref 3.5–5.2)
ALT: 20 U/L (ref 0–35)
AST: 27 U/L (ref 0–37)
Alkaline Phosphatase: 78 U/L (ref 39–117)
Bilirubin, Direct: 0.1 mg/dL (ref 0.0–0.3)
Indirect Bilirubin: 0.3 mg/dL (ref 0.2–1.2)
TOTAL PROTEIN: 6.6 g/dL (ref 6.0–8.3)
Total Bilirubin: 0.4 mg/dL (ref 0.2–1.2)

## 2013-11-08 LAB — INSULIN, FASTING: Insulin fasting, serum: 3 u[IU]/mL (ref 3–28)

## 2013-11-12 ENCOUNTER — Other Ambulatory Visit: Payer: Self-pay | Admitting: Emergency Medicine

## 2013-11-12 MED ORDER — PREDNISONE 10 MG PO TABS: ORAL_TABLET | ORAL | Status: DC

## 2013-12-02 ENCOUNTER — Other Ambulatory Visit: Payer: Self-pay | Admitting: Emergency Medicine

## 2014-01-02 ENCOUNTER — Other Ambulatory Visit: Payer: Self-pay | Admitting: Emergency Medicine

## 2014-02-12 ENCOUNTER — Ambulatory Visit (INDEPENDENT_AMBULATORY_CARE_PROVIDER_SITE_OTHER): Payer: Medicare Other | Admitting: Internal Medicine

## 2014-02-12 ENCOUNTER — Encounter: Payer: Self-pay | Admitting: Internal Medicine

## 2014-02-12 VITALS — BP 132/76 | HR 72 | Temp 97.9°F | Resp 16 | Ht 62.75 in | Wt 186.6 lb

## 2014-02-12 DIAGNOSIS — Z1212 Encounter for screening for malignant neoplasm of rectum: Secondary | ICD-10-CM

## 2014-02-12 DIAGNOSIS — E782 Mixed hyperlipidemia: Secondary | ICD-10-CM

## 2014-02-12 DIAGNOSIS — Z79899 Other long term (current) drug therapy: Secondary | ICD-10-CM

## 2014-02-12 DIAGNOSIS — E559 Vitamin D deficiency, unspecified: Secondary | ICD-10-CM

## 2014-02-12 DIAGNOSIS — I10 Essential (primary) hypertension: Secondary | ICD-10-CM

## 2014-02-12 DIAGNOSIS — Z1331 Encounter for screening for depression: Secondary | ICD-10-CM

## 2014-02-12 DIAGNOSIS — Z789 Other specified health status: Secondary | ICD-10-CM

## 2014-02-12 DIAGNOSIS — R7309 Other abnormal glucose: Secondary | ICD-10-CM

## 2014-02-12 DIAGNOSIS — Z0000 Encounter for general adult medical examination without abnormal findings: Secondary | ICD-10-CM

## 2014-02-12 LAB — CBC WITH DIFFERENTIAL/PLATELET
BASOS ABS: 0.1 10*3/uL (ref 0.0–0.1)
BASOS PCT: 1 % (ref 0–1)
EOS ABS: 0.2 10*3/uL (ref 0.0–0.7)
Eosinophils Relative: 2 % (ref 0–5)
HCT: 37 % (ref 36.0–46.0)
Hemoglobin: 12.7 g/dL (ref 12.0–15.0)
Lymphocytes Relative: 33 % (ref 12–46)
Lymphs Abs: 2.8 10*3/uL (ref 0.7–4.0)
MCH: 30.2 pg (ref 26.0–34.0)
MCHC: 34.3 g/dL (ref 30.0–36.0)
MCV: 88.1 fL (ref 78.0–100.0)
Monocytes Absolute: 0.7 10*3/uL (ref 0.1–1.0)
Monocytes Relative: 8 % (ref 3–12)
NEUTROS PCT: 56 % (ref 43–77)
Neutro Abs: 4.7 10*3/uL (ref 1.7–7.7)
PLATELETS: 294 10*3/uL (ref 150–400)
RBC: 4.2 MIL/uL (ref 3.87–5.11)
RDW: 14.8 % (ref 11.5–15.5)
WBC: 8.4 10*3/uL (ref 4.0–10.5)

## 2014-02-12 NOTE — Progress Notes (Signed)
Patient ID: Gwendolyn [name], female   DOB: Jun 27, 1941, 73 y.o.   MRN: [id number]   Annual Screening Comprehensive Examination  This very nice 73 y.o.[name] presents for complete physical.  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    HTN predates since 2008. Patient's BP has been controlled at home. Today's BP: 132/76 mmHg. Patient denies any cardiac symptoms as chest pain, palpitations, shortness of breath, dizziness or ankle swelling.   Patient's hyperlipidemia is controlled with diet and medications. Patient denies myalgias or other medication SE's. Last Lipids in Mar 2015 were at goal as below.  Lab Results  Component Value Date   CHOL 166 11/07/2013   HDL 53 11/07/2013   LDLCALC 96 11/07/2013   TRIG 87 11/07/2013   CHOLHDL 3.1 11/07/2013    Patient has prediabetes with A1c of 6.0% in May 2011 and last A1c was 5.8% in Mar 2015. Patient denies reactive hypoglycemic symptoms, visual blurring, diabetic polys, or paresthesias.    Finally, patient has history of Vitamin D Deficiency and last vitamin D was 52 in June 2014 and 91 in Jan 2015.. Medication Sig  . ALPRAZolam  0.5 MG tablet Take 0.25 mg by mouth at bedtime as needed.  Marland Kitchen aspirin EC 81 MG tablet Take 162 mg by mouth at bedtime.  Marland Kitchen VITAMIN D 1000 UNITS tablet Take 1,000 Units by mouth 2 (two) times daily.  Marland Kitchen CINNAMON PO Take 500 mg by mouth 2 (two) times daily.  Marland Kitchen estradiol  1 MG tablet TAKE 1 TABLET EVERY DAY  . FLAXSEED OIL  Take 1,000 mg by mouth 2 (two) times daily.   . furosemide (40 MG tablet Take 40 mg by mouth every other day.  . losartan-hctz 50-12.5 MG  TAKE 1 TABLET BY MOUTH EVERY DAY  .  (PROVERA) 2.5 MG tablet TAKE 1 TABLET EVERY DAY  . montelukast  10 MG tablet TAKE 1 TABLET BY MOUTH ONCE DAILY  . MULITIVITAMIN WITH MINERALS Take 1 tablet by mouth  daily.  Marland Kitchen VITAMIN B-6 100 MG tablet Take 100 mg by mouth 2 (two) times daily.  . Red Yeast Rice Extract   Takes 600 mg BID   Allergies  Allergen Reactions  . Oxycodone-Acetaminophen Other (See Comments)    panic attack  . Vit C-Cholecalciferol-Rose Hip [Cholecalciferol-Vitamin C] Other (See Comments)    dyspepsia  . Ibuprofen [Ibuprofen] Rash  . Macrodantin [Nitrofurantoin Macrocrystal] Rash   Past Medical History  Diagnosis Date  . Hypertension   . Arthritis   . Anemia   . 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 Pelvis fracture March 26, 2002    both sides fx  . Hyperlipidemia   . Pre-diabetes   . Vitamin D deficiency    Past Surgical History  Procedure Laterality Date  . Right shoulder humerus fx  july 2003    surgery done  . Right hand ulner nerve surgery  March 26, 2002    I and d done right hand/wrist  . Right elbow   surgery  March 26, 2002  . Right wrist plating  2004  . Right total hip arthroplasty  2008  . 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 Tonsillectomy  age 73  . Total hip arthroplasty  10/22/2011    Procedure: TOTAL HIP ARTHROPLASTY  ANTERIOR APPROACH;  Surgeon: Mauri Pole, MD;  Location: [facility];  Service: Orthopedics;  Laterality: Left;  Left Total Hip Arthroplasty,  Anterior Approach    Family History  Problem Relation Age of Onset  . Hypertension Mother   . Diabetes Mother   . Heart disease Mother   . Diabetes Father   . Heart disease Father    History  Substance Use Topics  . Smoking status: Never Smoker   . Smokeless tobacco: Never Used  . Alcohol Use: Yes     Comment: social    ROS Constitutional: Denies fever, chills, weight loss/gain, headaches, insomnia, fatigue, night sweats, and change in appetite. Eyes: Denies redness, blurred vision, diplopia, discharge, itchy, watery eyes.  ENT: Denies discharge, congestion, post nasal drip, epistaxis, sore throat, earache, hearing loss, dental pain, Tinnitus, Vertigo, Sinus pain,  snoring.  Cardio: Denies chest pain, palpitations, irregular heartbeat, syncope, dyspnea, diaphoresis, orthopnea, PND, claudication, edema Respiratory: denies cough, dyspnea, DOE, pleurisy, hoarseness, laryngitis, wheezing.  Gastrointestinal: Denies dysphagia, heartburn, reflux, water brash, pain, cramps, nausea, vomiting, bloating, diarrhea, constipation, hematemesis, melena, hematochezia, jaundice, hemorrhoids Genitourinary: Denies dysuria, frequency, urgency, nocturia, hesitancy, discharge, hematuria, flank pain Breast:Breast lumps, nipple discharge, bleeding.  Musculoskeletal: Denies arthralgia, myalgia, stiffness, Jt. Swelling, pain, limp, and strain/sprain. Skin: Denies puritis, rash, hives, warts, acne, eczema, changing in skin lesion Neuro: No weakness, tremor, incoordination, spasms, paresthesia, pain Psychiatric: Denies confusion, memory loss, sensory loss Endocrine: Denies change in weight, skin, hair change, nocturia, and paresthesia, diabetic polys, visual blurring, hyper / hypo glycemic episodes.  Heme/Lymph: No excessive bleeding, bruising, enlarged lymph nodes.   Physical Exam  BP 132/76  Pulse 72  Temp 97.9 F   Resp 16  Ht 5' 2.75"   Wt 186 lb 9.6 oz   BMI 33.31 kg/m2  General Appearance: Well nourished, in no apparent distress. Eyes: PERRLA, EOMs, conjunctiva no swelling or erythema, normal fundi and vessels. Sinuses: No frontal/maxillary tenderness ENT/Mouth: EACs patent / TMs  nl. Nares clear without erythema, swelling, mucoid exudates. Oral hygiene is good. No erythema, swelling, or exudate. Tongue normal, non-obstructing. Tonsils not swollen or erythematous. Hearing normal.  Neck: Supple, thyroid normal. No bruits, nodes or JVD. Respiratory: Respiratory effort normal.  BS equal and clear bilateral without rales, rhonci, wheezing or stridor. Cardio: Heart sounds are normal with regular rate and rhythm and no murmurs, rubs or gallops. Peripheral pulses are normal  and equal bilaterally without edema. No aortic or femoral bruits. Chest: symmetric with normal excursions and percussion. Breasts: Symmetric, without lumps, nipple discharge, retractions, or fibrocystic changes.  Abdomen: Flat, soft, with bowl sounds. Nontender, no guarding, rebound, hernias, masses, or organomegaly.  Lymphatics: Non tender without lymphadenopathy.  Genitourinary:  Musculoskeletal: Full ROM all peripheral extremities, joint stability, 5/5 strength, and normal gait. Skin: Warm and dry without rashes, lesions, cyanosis, clubbing or  ecchymosis.  Neuro: Cranial nerves intact, reflexes equal bilaterally. Normal muscle tone, no cerebellar symptoms. Sensation intact.  Pysch: Awake and oriented X 3, normal affect, Insight and Judgment appropriate.   Assessment and Plan  1. Annual Screening Examination 2. Hypertension  3. Hyperlipidemia 4. Pre Diabetes 5. 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 Discussed med's effects and SE's. Screening labs and tests as requested with regular follow-up as recommended.

## 2014-02-13 LAB — HEPATIC FUNCTION PANEL
ALT: 22 U/L (ref 0–35)
AST: 26 U/L (ref 0–37)
Albumin: 4 g/dL (ref 3.5–5.2)
Alkaline Phosphatase: 79 U/L (ref 39–117)
BILIRUBIN DIRECT: 0.1 mg/dL (ref 0.0–0.3)
BILIRUBIN INDIRECT: 0.3 mg/dL (ref 0.2–1.2)
BILIRUBIN TOTAL: 0.4 mg/dL (ref 0.2–1.2)
Total Protein: 6.6 g/dL (ref 6.0–8.3)

## 2014-02-13 LAB — LIPID PANEL
Cholesterol: 186 mg/dL (ref 0–200)
HDL: 53 mg/dL (ref >39)
LDL Cholesterol: 112 mg/dL — ABNORMAL HIGH (ref 0–99)
TRIGLYCERIDES: 107 mg/dL (ref <150)
Total CHOL/HDL Ratio: 3.5 Ratio
VLDL: 21 mg/dL (ref 0–40)

## 2014-02-13 LAB — BASIC METABOLIC PANEL WITH GFR
BUN: 27 mg/dL — ABNORMAL HIGH (ref 6–23)
CALCIUM: 9 mg/dL (ref 8.4–10.5)
CO2: 30 mEq/L (ref 19–32)
Chloride: 100 mEq/L (ref 96–112)
Creat: 0.78 mg/dL (ref 0.50–1.10)
GFR, EST AFRICAN AMERICAN: 88 mL/min
GFR, EST NON AFRICAN AMERICAN: 76 mL/min
Glucose, Bld: 82 mg/dL (ref 70–99)
POTASSIUM: 3.5 meq/L (ref 3.5–5.3)
SODIUM: 139 meq/L (ref 135–145)

## 2014-02-13 LAB — URINALYSIS, MICROSCOPIC ONLY
Bacteria, UA: NONE SEEN
Casts: NONE SEEN
Crystals: NONE SEEN

## 2014-02-13 LAB — HEMOGLOBIN A1C
HEMOGLOBIN A1C: 5.7 % — AB (ref <5.7)
MEAN PLASMA GLUCOSE: 117 mg/dL — AB (ref <117)

## 2014-02-13 LAB — INSULIN, FASTING: INSULIN FASTING, SERUM: 8 u[IU]/mL (ref 3–28)

## 2014-02-13 LAB — MICROALBUMIN / CREATININE URINE RATIO
CREATININE, URINE: 136.5 mg/dL
MICROALB UR: 0.5 mg/dL (ref 0.00–1.89)
Microalb Creat Ratio: 3.7 mg/g (ref 0.0–30.0)

## 2014-02-13 LAB — TSH: TSH: 1.038 u[IU]/mL (ref 0.350–4.500)

## 2014-02-13 LAB — MAGNESIUM: Magnesium: 2.1 mg/dL (ref 1.5–2.5)

## 2014-02-13 LAB — VITAMIN D 25 HYDROXY (VIT D DEFICIENCY, FRACTURES): Vit D, 25-Hydroxy: 85 ng/mL (ref 30–89)

## 2014-04-15 ENCOUNTER — Other Ambulatory Visit: Payer: Self-pay | Admitting: Internal Medicine

## 2014-04-17 ENCOUNTER — Other Ambulatory Visit: Payer: Self-pay | Admitting: *Deleted

## 2014-04-17 MED ORDER — ESTRADIOL 1 MG PO TABS: ORAL_TABLET | ORAL | Status: DC

## 2014-04-17 MED ORDER — ALPRAZOLAM 0.5 MG PO TABS: ORAL_TABLET | ORAL | Status: DC

## 2014-05-09 ENCOUNTER — Encounter: Payer: Self-pay | Admitting: Internal Medicine

## 2014-05-16 ENCOUNTER — Ambulatory Visit: Payer: Self-pay | Admitting: Physician Assistant

## 2014-05-18 ENCOUNTER — Other Ambulatory Visit: Payer: Self-pay

## 2014-05-18 DIAGNOSIS — Z1231 Encounter for screening mammogram for malignant neoplasm of breast: Secondary | ICD-10-CM

## 2014-05-21 ENCOUNTER — Ambulatory Visit: Payer: Self-pay | Admitting: Physician Assistant

## 2014-05-24 ENCOUNTER — Encounter: Payer: Self-pay | Admitting: Emergency Medicine

## 2014-05-24 ENCOUNTER — Ambulatory Visit (INDEPENDENT_AMBULATORY_CARE_PROVIDER_SITE_OTHER): Payer: Medicare Other | Admitting: Emergency Medicine

## 2014-05-24 VITALS — BP 150/84 | HR 68 | Temp 98.0°F | Resp 18 | Ht 62.75 in | Wt 191.0 lb

## 2014-05-24 DIAGNOSIS — E782 Mixed hyperlipidemia: Secondary | ICD-10-CM

## 2014-05-24 DIAGNOSIS — R7309 Other abnormal glucose: Secondary | ICD-10-CM

## 2014-05-24 DIAGNOSIS — Z23 Encounter for immunization: Secondary | ICD-10-CM

## 2014-05-24 DIAGNOSIS — I10 Essential (primary) hypertension: Secondary | ICD-10-CM

## 2014-05-24 LAB — LIPID PANEL
CHOL/HDL RATIO: 3.5 ratio
Cholesterol: 215 mg/dL — ABNORMAL HIGH (ref 0–200)
HDL: 62 mg/dL (ref >39)
LDL Cholesterol: 137 mg/dL — ABNORMAL HIGH (ref 0–99)
Triglycerides: 80 mg/dL (ref <150)
VLDL: 16 mg/dL (ref 0–40)

## 2014-05-24 LAB — HEPATIC FUNCTION PANEL
ALBUMIN: 4.2 g/dL (ref 3.5–5.2)
ALK PHOS: 101 U/L (ref 39–117)
ALT: 35 U/L (ref 0–35)
AST: 31 U/L (ref 0–37)
BILIRUBIN DIRECT: 0.1 mg/dL (ref 0.0–0.3)
BILIRUBIN TOTAL: 0.5 mg/dL (ref 0.2–1.2)
Indirect Bilirubin: 0.4 mg/dL (ref 0.2–1.2)
Total Protein: 7.1 g/dL (ref 6.0–8.3)

## 2014-05-24 LAB — BASIC METABOLIC PANEL WITH GFR
BUN: 33 mg/dL — ABNORMAL HIGH (ref 6–23)
CHLORIDE: 99 meq/L (ref 96–112)
CO2: 26 mEq/L (ref 19–32)
Calcium: 9.5 mg/dL (ref 8.4–10.5)
Creat: 0.89 mg/dL (ref 0.50–1.10)
GFR, EST AFRICAN AMERICAN: 75 mL/min
GFR, Est Non African American: 65 mL/min
Glucose, Bld: 87 mg/dL (ref 70–99)
Potassium: 4.1 mEq/L (ref 3.5–5.3)
SODIUM: 134 meq/L — AB (ref 135–145)

## 2014-05-24 MED ORDER — FUROSEMIDE 80 MG PO TABS: 80.0000 mg | ORAL_TABLET | Freq: Every day | ORAL | Status: DC

## 2014-05-24 MED ORDER — PREGABALIN 150 MG PO CAPS: 150.0000 mg | ORAL_CAPSULE | Freq: Three times a day (TID) | ORAL | Status: DC

## 2014-05-24 NOTE — Patient Instructions (Signed)

## 2014-05-24 NOTE — Progress Notes (Signed)
Subjective:    Patient ID: Gwendolyn [name], female    DOB: Nov 01, 1940, 73 y.o.   MRN: [id number]  HPI Comments: 80 y WF presents for 3 month F/U for HTN, Cholesterol, Pre-Dm, D. Deficient. BP up because of recent financial issues with stock market. She notes she has been eating terribly and not exercising.  WBC             8.4   02/12/2014 HGB            12.7   02/12/2014 HCT            37.0   02/12/2014 PLT             294   02/12/2014 GLUCOSE          82   02/12/2014 CHOL            186   02/12/2014 TRIG            107   02/12/2014 HDL              53   02/12/2014 LDLCALC         112   02/12/2014 ALT              22   02/12/2014 AST              26   02/12/2014 NA              139   02/12/2014 K               3.5   02/12/2014 CL              100   02/12/2014 CREATININE     0.78   02/12/2014 BUN              27   02/12/2014 CO2              30   02/12/2014 TSH           1.038   02/12/2014 INR            1.04   10/19/2011 HGBA1C          5.7   02/12/2014 MICROALBUR     0.50   02/12/2014     Medication List       This list is accurate as of: 05/24/14 11:59 PM.  Always use your most recent med list.               ALPRAZolam 0.5 MG tablet  Commonly known as:  XANAX  TAKE 1/2 OR 1 TABLET BY MOUTH 3 TIMES A DAY AS NEEDED FOR ANXIETY     aspirin EC 81 MG tablet  Take 162 mg by mouth at bedtime.     cholecalciferol 1000 UNITS tablet  Commonly known as:  VITAMIN D  Take 1,000 Units by mouth daily.     CINNAMON PO  Take 500 mg by mouth 2 (two) times daily.     estradiol 1 MG tablet  Commonly known as:  ESTRACE  TAKE 1 TABLET EVERY DAY     FLAXSEED OIL PO  Take 1,000 mg by mouth 2 (two) times daily.     furosemide 80 MG tablet  Commonly known as:  LASIX  Take 1 tablet (80 mg total) by mouth daily.     losartan-hydrochlorothiazide 50-12.5 MG per tablet  Commonly known as:  HYZAAR  TAKE 1 TABLET BY MOUTH EVERY  DAY     medroxyPROGESTERone 2.5 MG tablet  Commonly known as:   PROVERA  TAKE 1 TABLET EVERY DAY     montelukast 10 MG tablet  Commonly known as:  SINGULAIR  TAKE 1 TABLET BY MOUTH ONCE DAILY     multivitamin with minerals Tabs tablet  Take 1 tablet by mouth daily.     Omega-3 Fish Oil 1200 MG Caps  Take by mouth 2 (two) times daily.     pregabalin 150 MG capsule  Commonly known as:  LYRICA  Take 1 capsule (150 mg total) by mouth 3 (three) times daily.     pyridOXINE 100 MG tablet  Commonly known as:  VITAMIN B-6  Take 100 mg by mouth 2 (two) times daily.     RED YEAST RICE PO  Take 2 capsules by mouth 2 (two) times daily. Takes 600 mg BID       Allergies  Allergen Reactions  . Oxycodone-Acetaminophen Other (See Comments)    panic attack  . Vit C-Cholecalciferol-Rose Hip [Cholecalciferol-Vitamin C] Other (See Comments)    dyspepsia  . Ibuprofen [Ibuprofen] Rash  . Macrodantin [Nitrofurantoin Macrocrystal] Rash   Past Medical History  Diagnosis Date  . Hypertension   . Arthritis   . Anemia   . 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 Pelvis fracture March 26, 2002    both sides fx  . Hyperlipidemia   . Pre-diabetes   . Vitamin D deficiency       Review of Systems  Respiratory: Negative for shortness of breath.   Cardiovascular: Negative for chest pain.  All other systems reviewed and are negative.  BP 150/84  Pulse 68  Temp(Src) 98 F (36.7 C) (Temporal)  Resp 18  Ht 5' 2.75" (1.594 m)  Wt 191 lb (86.637 kg)  BMI 34.10 kg/m2     Objective:   Physical Exam  Nursing note and vitals reviewed. Constitutional: She is oriented to person, place, and time. She appears well-developed and well-nourished. No distress.  HENT:  Head: Normocephalic and atraumatic.  Right Ear: External ear normal.  Left Ear: External ear normal.  Nose: Nose normal.  Eyes: Conjunctivae and EOM are normal.  Neck: Normal range of motion. Neck supple. No thyromegaly present.  Cardiovascular: Normal rate, regular rhythm, normal heart  sounds and intact distal pulses.   Pulmonary/Chest: Effort normal and breath sounds normal.  Abdominal: Soft. Bowel sounds are normal. She exhibits no distension. There is no tenderness.  Musculoskeletal: Normal range of motion. She exhibits no edema and no tenderness.  Lymphadenopathy:    She has no cervical adenopathy.  Neurological: She is alert and oriented to person, place, and time. No cranial nerve deficit.  Skin: Skin is warm and dry. No rash noted. No erythema. No pallor.  Psychiatric: She has a normal mood and affect. Her behavior is normal. Judgment and thought content normal.        Assessment & Plan:  1.  3 month F/U for HTN, Cholesterol, Pre-Dm, D. Deficient. Needs healthy diet, cardio QD and obtain healthy weight. Check Labs, Check BP if >130/80 call office

## 2014-05-25 LAB — HEMOGLOBIN A1C
Hgb A1c MFr Bld: 6 % — ABNORMAL HIGH (ref <5.7)
MEAN PLASMA GLUCOSE: 126 mg/dL — AB (ref <117)

## 2014-05-30 ENCOUNTER — Ambulatory Visit
Admission: RE | Admit: 2014-05-30 | Discharge: 2014-05-30 | Disposition: A | Discharge status: Home / Self Care | Payer: Medicare Other | Source: Ambulatory Visit

## 2014-05-30 DIAGNOSIS — Z1231 Encounter for screening mammogram for malignant neoplasm of breast: Secondary | ICD-10-CM

## 2014-06-06 ENCOUNTER — Ambulatory Visit (INDEPENDENT_AMBULATORY_CARE_PROVIDER_SITE_OTHER): Payer: Medicare Other | Admitting: *Deleted

## 2014-06-06 DIAGNOSIS — Z23 Encounter for immunization: Secondary | ICD-10-CM

## 2014-06-06 NOTE — Progress Notes (Signed)
Patient ID: Gwendolyn [name], female   DOB: 1940-10-03, 73 y.o.   MRN: [id number] Flu vax

## 2014-08-21 ENCOUNTER — Ambulatory Visit (INDEPENDENT_AMBULATORY_CARE_PROVIDER_SITE_OTHER): Payer: Medicare Other | Admitting: Internal Medicine

## 2014-08-21 ENCOUNTER — Encounter: Payer: Self-pay | Admitting: Internal Medicine

## 2014-08-21 VITALS — BP 114/66 | HR 76 | Temp 98.1°F | Resp 16 | Ht 64.0 in | Wt 194.0 lb

## 2014-08-21 DIAGNOSIS — R7309 Other abnormal glucose: Secondary | ICD-10-CM

## 2014-08-21 DIAGNOSIS — I10 Essential (primary) hypertension: Secondary | ICD-10-CM

## 2014-08-21 DIAGNOSIS — Z79899 Other long term (current) drug therapy: Secondary | ICD-10-CM

## 2014-08-21 DIAGNOSIS — E559 Vitamin D deficiency, unspecified: Secondary | ICD-10-CM

## 2014-08-21 DIAGNOSIS — E782 Mixed hyperlipidemia: Secondary | ICD-10-CM

## 2014-08-21 DIAGNOSIS — R7303 Prediabetes: Secondary | ICD-10-CM

## 2014-08-21 DIAGNOSIS — Z0001 Encounter for general adult medical examination with abnormal findings: Secondary | ICD-10-CM

## 2014-08-21 DIAGNOSIS — R6889 Other general symptoms and signs: Secondary | ICD-10-CM

## 2014-08-21 NOTE — Patient Instructions (Signed)

## 2014-08-21 NOTE — Progress Notes (Signed)
Patient ID: Gwendolyn [name], female   DOB: October 13, 1940, 73 y.o.   MRN: [id number]  MEDICARE ANNUAL WELLNESS VISIT AND OV  Assessment:   1. Essential hypertension  - TSH  2. Hyperlipidemia  - Lipid panel  3. Prediabetes  - Hemoglobin A1c - Insulin, fasting  4. Vitamin D deficiency  - Vit D  25 hydroxy (rtn osteoporosis monitoring)  5. Medication management  - CBC with Differential - BASIC METABOLIC PANEL WITH GFR - Hepatic function panel - Magnesium  6. Encounter for general adult medical examination with abnormal findings   Plan:   During the course of the visit the patient was educated and counseled about appropriate screening and preventive services including:    Pneumococcal vaccine   Influenza vaccine  Td vaccine  Screening electrocardiogram  Bone densitometry screening  Colorectal cancer screening  Diabetes screening  Glaucoma screening  Nutrition counseling   Advanced directives: requested  Screening recommendations, referrals: Vaccinations: Tdap vaccine 01/28/2012 Influenza vaccine HD 10 / 7 /2015 Pneumococcal vaccine 2010 Prevnar vaccine 05/24/2014 Shingles vaccine 2008 Hep B vaccine 1993  Nutrition assessed and recommended  Colonoscopy 2008 - 42yr f/u Recommended yearly ophthalmology/optometry visit for glaucoma screening and checkup Recommended yearly dental visit for hygiene and checkup Advanced directives - yes  Conditions/risks identified: BMI: Morbid Obesity (BMI 33.28). Discussed weight loss, diet, and increase physical activity.  Increase physical activity: AHA recommends 150 minutes of physical activity a week.  Medications reviewed PreDiabetes is near goal with diet, ACE/ARB therapy: No, Reason not on Ace Inhibitor/ARB therapy:  On an ARB Urinary Incontinence is not an issue: discussed non pharmacology and pharmacology options.  Fall risk: low- discussed PT, home fall assessment, medications.   Subjective:   Gwendolyn Solt  [name] is a 73 y.o. [name] who presents for Medicare Annual Wellness Visit and OV.  Date of last medicare wellness visit is unknown.  She has had elevated blood pressure since 2008. Her blood pressure has been controlled at home, & today their BP is BP: 114/66 mmHg   She does not workout. She denies chest pain, shortness of breath, dizziness. Patient doeshave significant past hx/o RSD/CRPD of the right hand predating to an orthopedic injury in 2003.   She is not on cholesterol medication and denies myalgias. Her cholesterol is not at goal. The cholesterol last visit was:  Lab Results  Component Value Date   CHOL 215* 05/24/2014   HDL 62 05/24/2014   LDLCALC 137* 05/24/2014   TRIG 80 05/24/2014   CHOLHDL 3.5 05/24/2014   She has had prediabetes for 4 years (A1c 6.0% in May 2011). She has not been working on diet and exercise for prediabetes, and denies foot ulcerations, hyperglycemia, hypoglycemia , paresthesia of the feet, polydipsia, polyuria and visual disturbances. Last A1C in the office was:  Lab Results  Component Value Date   HGBA1C 6.0* 05/24/2014   Patient is on Vitamin D supplement.   Lab Results  Component Value Date   VD25OH 85 02/12/2014     Names of Other Physician/Practitioners you currently use: 1. [facility] here for primary care 2. Dr Gershon Crane , eye doctor, last visit Dec 2014 3. Dr Arrie Aran, DDS, dentist, last visit Nov 2015  Patient Care Team: Unk Pinto, MD as PCP - General (Internal Medicine) Lafayette Dragon, MD as Consulting Physician (Gastroenterology) Melina Schools, MD as Consulting Physician (Obstetrics and Gynecology)  Medication Review: Medication Sig  . ALPRAZolam (XANAX) 0.5 MG tablet TAKE 1/2 OR  1 TABLET BY MOUTH 3 TIMES A DAY AS NEEDED FOR ANXIETY  . aspirin EC 81 MG tablet Take 162 mg by mouth at bedtime.  Marland Kitchen VITAMIN D 1000 UNITS  Take 1,000 Units by mouth daily.   Marland Kitchen CINNAMON PO Take 500 mg  2  times  daily.  Marland Kitchen estradiol  1 MG tablet TAKE 1 TABLET EVERY DAY  . FLAXSEED OIL  Take 1,000 mg  2  times daily.   . furosemide (LASIX) 80 MG tablet Take 1 tab daily.  Marland Kitchen losartan-hctz 50-12.5 TAKE 1 TABLET BY MOUTH EVERY DAY  .  PROVERA 2.5 MG tablet TAKE 1 TABLET EVERY DAY  . montelukast 10 MG tablet TAKE 1 TABLET BY MOUTH ONCE DAILY  . MULITIVITAMIN WITH MINERALS Take 1 tablet by mouth daily.  Marland Kitchen  FISH OIL [place] 2  times daily.  . pregabalin (LYRICA) 150 MG capsule Take 1 capsule  3  times daily.  Marland Kitchen pyridOXINE (VITAMIN B-6) 100 MG  Take 100 mg by mouth 2 (two) times daily.  . Red Yeast Rice Extract  Take 2 capsules  2  times daily. = 600 mg BID   Current Problems (verified) Patient Active Problem List   Diagnosis Date Noted  . Medication management 02/12/2014  . Essential hypertension 08/08/2013  . Hyperlipidemia 08/08/2013  . Prediabetes 08/08/2013  . Vitamin D deficiency 08/08/2013   Screening Tests Health Maintenance  Topic Date Due  . COLONOSCOPY  01/14/1992  . INFLUENZA VACCINE  04/01/2015  . MAMMOGRAM  05/30/2016  . TETANUS/TDAP  01/27/2022  . DEXA SCAN  Completed  . PNEUMOCOCCAL POLYSACCHARIDE VACCINE AGE 60 AND OVER  Completed  . ZOSTAVAX  Completed   Immunization History  Administered Date(s) Administered  . Influenza, High Dose Seasonal PF 06/06/2014  . Influenza-Unspecified 06/13/2013  . Pneumococcal Conjugate-13 05/24/2014  . Pneumococcal Polysaccharide-23 01/08/2009  . Tdap 01/28/2012  . Zoster 08/31/2006   Preventative care: Last colonoscopy: 2008 - f/u due 10 yr in 2008  Prior vaccinations: Tdap: 01/28/2012  Influenza: HD 06/06/2014  Pneumococcal: 2010 Prevnar: 05/24/2014 Shingles/Zostavax: 2008  History reviewed: allergies, current medications, past family history, past medical history, past social history, past surgical history and problem list  Risk Factors: Tobacco History  Substance Use Topics  . Smoking status: Never Smoker   .  Smokeless tobacco: Never Used  . Alcohol Use: Yes     Comment: social   She does not smoke.  Patient is not a former smoker. Are there smokers in your home (other than you)?  No  Alcohol Current alcohol use: social drinker  Caffeine Current caffeine use: denies use  Exercise Current exercise: swimming and walking  Nutrition/Diet Current diet: in general, a "healthy" diet    Cardiac risk factors: advanced age (older than 49 for men, 79 for women), dyslipidemia, hypertension and sedentary lifestyle.  Depression Screen (Note: if answer to either of the following is "Yes", a more complete depression screening is indicated)   Q1: Over the past two weeks, have you felt down, depressed or hopeless? No  Q2: Over the past two weeks, have you felt little interest or pleasure in doing things? No  Have you lost interest or pleasure in daily life? No  Do you often feel hopeless? No  Do you cry easily over simple problems? No  Activities of Daily Living In your present state of health, do you have any difficulty performing the following activities?:  Driving? No Managing money?  No Feeding yourself?  No Getting from bed to chair? No Climbing a flight of stairs? No Preparing food and eating?: No Bathing or showering? No Getting dressed: No Getting to the toilet? No Using the toilet:No Moving around from place to place: No In the past year have you fallen or had a near fall?:No   Are you sexually active?  No  Do you have more than one partner?  No  Vision Difficulties: No  Hearing Difficulties: No Do you often ask people to speak up or repeat themselves? No Do you experience ringing or noises in your ears? No Do you have difficulty understanding soft or whispered voices? Sometimes.  Cognition  Do you feel that you have a problem with memory?No  Do you often misplace items? No  Do you feel safe at home?  Yes  Advanced directives Does patient have a [place]? Yes Does patient have a Living Will? Yes  In addition to the HPI above,  No Fever-chills,  No Headache, No changes with Vision or hearing,  No problems swallowing food or Liquids,  No Chest pain or productive Cough or Shortness of Breath,  No Abdominal pain, No Nausea or Vomitting, Bowel movements are regular,  No Blood in stool or Urine,  No dysuria,  No new skin rashes or bruises,  No new joints pains-aches,  No new weakness, tingling, numbness in any extremity,  No recent weight loss,  No polyuria, polydypsia or polyphagia,  No significant Mental Stressors.  A full 10 point Review of Systems was done, except as stated above, all other Review of Systems were negative  Objective:     BP 114/66  Pulse 76  Temp 98.1 F   Resp 16  Ht 5\' 4"    Wt 194 lb                        BMI 33.28   General appearance: alert, no distress, WD/WN, female Cognitive Testing  Alert? Yes  Normal Appearance?Yes  Oriented to person? Yes  Place? Yes   Time? Yes  Recall of three objects?  Yes  Can perform simple calculations? Yes  Displays appropriate judgment? Yes  Can read the correct time from a watch/clock?Yes  HEENT: normocephalic, sclerae anicteric, TMs pearly, nares patent, no discharge or erythema, pharynx normal Oral cavity: MMM, no lesions Neck: supple, no lymphadenopathy, no thyromegaly, no masses Heart: RRR, normal S1, S2, no murmurs Lungs: CTA bilaterally, no wheezes, rhonchi, or rales Abdomen: +bs, soft, non tender, non distended, no masses, no hepatomegaly, no splenomegaly Musculoskeletal: nontender, no swelling, no obvious deformity Extremities: no edema, no cyanosis, no clubbing Pulses: 2+ symmetric, upper and lower extremities, normal cap refill Neurological: alert, oriented x 3, CN2-12 intact, strength normal upper extremities and lower extremities, sensation normal throughout, DTRs 2+ throughout, no cerebellar signs, gait normal Psychiatric: normal affect,  behavior normal, pleasant   Medicare Attestation I have personally reviewed: The patient's medical and social history Their use of alcohol, tobacco or illicit drugs Their current medications and supplements The patient's functional ability including ADLs,fall risks, home safety risks, cognitive, and hearing and visual impairment Diet and physical activities Evidence for depression or mood disorders  The patient's weight, height, BMI, and visual acuity have been recorded in the chart.  I have made referrals, counseling, and provided education to the patient based on review of the above and I have provided the patient with a written personalized care plan for preventive services.  Vada Swift DAVID, MD   08/21/2014

## 2014-08-22 LAB — BASIC METABOLIC PANEL WITH GFR
BUN: 29 mg/dL — AB (ref 6–23)
CALCIUM: 9.1 mg/dL (ref 8.4–10.5)
CO2: 28 mEq/L (ref 19–32)
Chloride: 100 mEq/L (ref 96–112)
Creat: 0.82 mg/dL (ref 0.50–1.10)
GFR, EST AFRICAN AMERICAN: 83 mL/min
GFR, Est Non African American: 72 mL/min
GLUCOSE: 97 mg/dL (ref 70–99)
Potassium: 4 mEq/L (ref 3.5–5.3)
Sodium: 137 mEq/L (ref 135–145)

## 2014-08-22 LAB — HEPATIC FUNCTION PANEL
ALT: 29 U/L (ref 0–35)
AST: 31 U/L (ref 0–37)
Albumin: 4 g/dL (ref 3.5–5.2)
Alkaline Phosphatase: 112 U/L (ref 39–117)
Bilirubin, Direct: 0.1 mg/dL (ref 0.0–0.3)
Indirect Bilirubin: 0.5 mg/dL (ref 0.2–1.2)
TOTAL PROTEIN: 6.7 g/dL (ref 6.0–8.3)
Total Bilirubin: 0.6 mg/dL (ref 0.2–1.2)

## 2014-08-22 LAB — LIPID PANEL
CHOLESTEROL: 216 mg/dL — AB (ref 0–200)
HDL: 50 mg/dL (ref >39)
LDL Cholesterol: 149 mg/dL — ABNORMAL HIGH (ref 0–99)
Total CHOL/HDL Ratio: 4.3 Ratio
Triglycerides: 84 mg/dL (ref <150)
VLDL: 17 mg/dL (ref 0–40)

## 2014-08-22 LAB — CBC WITH DIFFERENTIAL/PLATELET
BASOS ABS: 0.1 10*3/uL (ref 0.0–0.1)
BASOS PCT: 1 % (ref 0–1)
EOS ABS: 0.8 10*3/uL — AB (ref 0.0–0.7)
Eosinophils Relative: 12 % — ABNORMAL HIGH (ref 0–5)
HEMATOCRIT: 39.7 % (ref 36.0–46.0)
Hemoglobin: 13.2 g/dL (ref 12.0–15.0)
Lymphocytes Relative: 25 % (ref 12–46)
Lymphs Abs: 1.6 10*3/uL (ref 0.7–4.0)
MCH: 29.7 pg (ref 26.0–34.0)
MCHC: 33.2 g/dL (ref 30.0–36.0)
MCV: 89.4 fL (ref 78.0–100.0)
MONO ABS: 0.6 10*3/uL (ref 0.1–1.0)
MONOS PCT: 10 % (ref 3–12)
MPV: 11.1 fL (ref 9.4–12.4)
Neutro Abs: 3.3 10*3/uL (ref 1.7–7.7)
Neutrophils Relative %: 52 % (ref 43–77)
PLATELETS: 324 10*3/uL (ref 150–400)
RBC: 4.44 MIL/uL (ref 3.87–5.11)
RDW: 14.5 % (ref 11.5–15.5)
WBC: 6.4 10*3/uL (ref 4.0–10.5)

## 2014-08-22 LAB — TSH: TSH: 1.834 u[IU]/mL (ref 0.350–4.500)

## 2014-08-22 LAB — VITAMIN D 25 HYDROXY (VIT D DEFICIENCY, FRACTURES): VIT D 25 HYDROXY: 63 ng/mL (ref 30–100)

## 2014-08-22 LAB — HEMOGLOBIN A1C
Hgb A1c MFr Bld: 6 % — ABNORMAL HIGH (ref <5.7)
MEAN PLASMA GLUCOSE: 126 mg/dL — AB (ref <117)

## 2014-08-22 LAB — INSULIN, FASTING: Insulin fasting, serum: 9.7 u[IU]/mL (ref 2.0–19.6)

## 2014-08-22 LAB — MAGNESIUM: MAGNESIUM: 1.8 mg/dL (ref 1.5–2.5)

## 2014-08-25 ENCOUNTER — Other Ambulatory Visit: Payer: Self-pay | Admitting: Internal Medicine

## 2014-08-25 DIAGNOSIS — E782 Mixed hyperlipidemia: Secondary | ICD-10-CM

## 2014-08-25 MED ORDER — ATORVASTATIN CALCIUM 80 MG PO TABS
80.0000 mg | ORAL_TABLET | Freq: Every day | ORAL | Status: DC
Start: 2014-08-25 — End: 2016-03-17

## 2014-09-10 ENCOUNTER — Other Ambulatory Visit: Payer: Self-pay | Admitting: Physician Assistant

## 2014-10-06 ENCOUNTER — Other Ambulatory Visit: Payer: Self-pay | Admitting: Physician Assistant

## 2014-11-22 ENCOUNTER — Encounter: Payer: Self-pay | Admitting: Internal Medicine

## 2014-11-22 ENCOUNTER — Ambulatory Visit (INDEPENDENT_AMBULATORY_CARE_PROVIDER_SITE_OTHER): Payer: PPO | Admitting: Internal Medicine

## 2014-11-22 VITALS — BP 126/64 | HR 72 | Temp 97.8°F | Resp 16 | Ht 64.0 in | Wt 193.0 lb

## 2014-11-22 DIAGNOSIS — I10 Essential (primary) hypertension: Secondary | ICD-10-CM

## 2014-11-22 DIAGNOSIS — M545 Low back pain, unspecified: Secondary | ICD-10-CM

## 2014-11-22 DIAGNOSIS — Z79899 Other long term (current) drug therapy: Secondary | ICD-10-CM

## 2014-11-22 DIAGNOSIS — E782 Mixed hyperlipidemia: Secondary | ICD-10-CM

## 2014-11-22 DIAGNOSIS — R7303 Prediabetes: Secondary | ICD-10-CM

## 2014-11-22 DIAGNOSIS — E559 Vitamin D deficiency, unspecified: Secondary | ICD-10-CM

## 2014-11-22 DIAGNOSIS — R7309 Other abnormal glucose: Secondary | ICD-10-CM

## 2014-11-22 LAB — CBC WITH DIFFERENTIAL/PLATELET
Basophils Absolute: 0.1 10*3/uL (ref 0.0–0.1)
Basophils Relative: 1 % (ref 0–1)
Eosinophils Absolute: 0.2 10*3/uL (ref 0.0–0.7)
Eosinophils Relative: 4 % (ref 0–5)
HCT: 38.7 % (ref 36.0–46.0)
HEMOGLOBIN: 13 g/dL (ref 12.0–15.0)
LYMPHS ABS: 1.7 10*3/uL (ref 0.7–4.0)
LYMPHS PCT: 31 % (ref 12–46)
MCH: 30.1 pg (ref 26.0–34.0)
MCHC: 33.6 g/dL (ref 30.0–36.0)
MCV: 89.6 fL (ref 78.0–100.0)
MONO ABS: 0.6 10*3/uL (ref 0.1–1.0)
MPV: 10.8 fL (ref 8.6–12.4)
Monocytes Relative: 10 % (ref 3–12)
NEUTROS ABS: 3 10*3/uL (ref 1.7–7.7)
Neutrophils Relative %: 54 % (ref 43–77)
Platelets: 308 10*3/uL (ref 150–400)
RBC: 4.32 MIL/uL (ref 3.87–5.11)
RDW: 15.2 % (ref 11.5–15.5)
WBC: 5.6 10*3/uL (ref 4.0–10.5)

## 2014-11-22 LAB — HEMOGLOBIN A1C
HEMOGLOBIN A1C: 6 % — AB (ref <5.7)
Mean Plasma Glucose: 126 mg/dL — ABNORMAL HIGH (ref <117)

## 2014-11-22 MED ORDER — PREDNISONE 20 MG PO TABS: ORAL_TABLET | ORAL | Status: DC

## 2014-11-22 NOTE — Progress Notes (Signed)
Patient ID: Gwendolyn [name], female   DOB: [birth year], 74 y.o.   MRN: [id number]  Assessment and Plan:   1. Essential hypertension -cont meds - TSH  2. Hyperlipidemia  - Lipid panel  3. Prediabetes -cont diet and exercise - Hemoglobin A1c - Insulin, fasting  4. Vitamin D deficiency -cont supplement - Vit D  25 hydroxy (rtn osteoporosis monitoring)  5. Medication management  - CBC with Differential/Platelet - BASIC METABOLIC PANEL WITH GFR - Magnesium - Hepatic function panel  6. Left-sided low back pain without sciatica -back pain and joint pain.  Try prednisone -recommended speaking with ortho about synvisc injections - predniSONE (DELTASONE) 20 MG tablet; 3 tabs po day one, then 2 tabs daily x 4 days  Dispense: 11 tablet; Refill: 0 - Urinalysis, Routine w reflex microscopic - Urine culture     Continue diet and meds as discussed. Further disposition pending results of labs.  HPI 74 y.o. female  presents for 3 month follow up with hypertension, hyperlipidemia, prediabetes and vitamin D.   Her blood pressure has been controlled at home, today their BP is BP: 126/64 mmHg.   She does workout.  She is going to [employer] 3 times weekly and is now doing water aerobics.   She denies chest pain, shortness of breath, dizziness.   She is on cholesterol medication and denies myalgias. Her cholesterol is not at goal. The cholesterol last visit was:   Lab Results  Component Value Date   CHOL 216* 08/21/2014   HDL 50 08/21/2014   LDLCALC 149* 08/21/2014   TRIG 84 08/21/2014   CHOLHDL 4.3 08/21/2014     She has been working on diet and exercise for prediabetes, and denies foot ulcerations, hyperglycemia, hypoglycemia , increased appetite, nausea, paresthesia of the feet, polydipsia, polyuria, visual disturbances, vomiting and weight loss. Last A1C in the office was:  Lab Results  Component Value Date   HGBA1C 6.0* 08/21/2014    Patient is on Vitamin D supplement.   Lab Results  Component Value Date   VD25OH 93 08/21/2014     She also notes that she developed some back pain on Sunday evening.  She did move some furniture on Saturday.  She reports that it was more on the left side of the lumbar spine.  She reports the heating pad is helping and also some water aerobics is helping.  She is taking Aleve 500 mg twice daily.  She reports that it helps a little bit.      Current Medications:  Current Outpatient Prescriptions on File Prior to Visit  Medication Sig Dispense Refill  . ALPRAZolam (XANAX) 0.5 MG tablet TAKE 1/2 OR 1 TABLET BY MOUTH 3 TIMES A DAY AS NEEDED FOR ANXIETY 90 tablet 5  . aspirin EC 81 MG tablet Take 162 mg by mouth at bedtime.    Marland Kitchen atorvastatin (LIPITOR) 80 MG tablet Take 1 tablet (80 mg total) by mouth daily. 30 tablet 11  . cholecalciferol (VITAMIN D) 1000 UNITS tablet Take 1,000 Units by mouth daily.     Marland Kitchen CINNAMON PO Take 500 mg by mouth 2 (two) times daily.    Marland Kitchen estradiol (ESTRACE) 1 MG tablet TAKE 1 TABLET EVERY DAY 90 tablet 1  . Flaxseed, Linseed, (FLAXSEED OIL PO) Take 1,000 mg by mouth 2 (two) times daily.     . furosemide (LASIX) 80 MG tablet Take 1 tablet (80 mg total) by mouth daily. 90 tablet 1  . losartan-hydrochlorothiazide (HYZAAR) 50-12.5 MG per  tablet TAKE 1 TABLET BY MOUTH EVERY DAY 90 tablet 2  . medroxyPROGESTERone (PROVERA) 2.5 MG tablet TAKE 1 TABLET EVERY DAY 90 tablet 1  . montelukast (SINGULAIR) 10 MG tablet TAKE 1 TABLET BY MOUTH ONCE DAILY 90 tablet 99  . Multiple Vitamin (MULITIVITAMIN WITH MINERALS) TABS Take 1 tablet by mouth daily.    . Omega-3 Fatty Acids (OMEGA-3 FISH OIL) 1200 MG CAPS Take by mouth 2 (two) times daily.    . pregabalin (LYRICA) 150 MG capsule Take 1 capsule (150 mg total) by mouth 3 (three) times daily. 270 capsule 0  . pyridOXINE (VITAMIN B-6) 100 MG tablet Take 100 mg by mouth 2 (two) times daily.     No current facility-administered medications on file prior to visit.     Medical History:  Past Medical History  Diagnosis Date  . Hypertension   . Arthritis   . Anemia   . 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 Pelvis fracture March 26, 2002    both sides fx  . Hyperlipidemia   . Pre-diabetes   . Vitamin D deficiency     Allergies:  Allergies  Allergen Reactions  . Oxycodone-Acetaminophen Other (See Comments)    panic attack  . Vit C-Cholecalciferol-Rose Hip [Cholecalciferol-Vitamin C] Other (See Comments)    dyspepsia  . Ibuprofen [Ibuprofen] Rash  . Macrodantin [Nitrofurantoin Macrocrystal] Rash     Review of Systems:  Review of Systems  Constitutional: Negative for fever, chills, weight loss and diaphoresis.  HENT: Negative for congestion, ear pain, sore throat and tinnitus.   Eyes: Negative.   Respiratory: Negative.  Negative for cough, sputum production and shortness of breath.   Cardiovascular: Negative for chest pain, palpitations and leg swelling.  Gastrointestinal: Negative for heartburn, nausea, vomiting, abdominal pain, diarrhea, constipation, blood in stool and melena.  Genitourinary: Negative.   Musculoskeletal: Positive for back pain.  Skin: Negative.   Neurological: Negative.  Negative for weakness and headaches.    Family history- Review and unchanged  Social history- Review and unchanged  Physical Exam: BP 126/64 mmHg  Pulse 72  Temp(Src) 97.8 F (36.6 C) (Temporal)  Resp 16  Ht 5\' 4"  (1.626 m)  Wt 193 lb (87.544 kg)  BMI 33.11 kg/m2 Wt Readings from Last 3 Encounters:  11/22/14 193 lb (87.544 kg)  08/21/14 194 lb (87.998 kg)  05/24/14 191 lb (86.637 kg)    General Appearance: Well nourished well developed, in no apparent distress. Eyes: PERRLA, EOMs, conjunctiva no swelling or erythema ENT/Mouth: Ear canals normal without obstruction, swelling, erythma, discharge.  TMs normal bilaterally.  Oropharynx moist, clear, without exudate, or postoropharyngeal swelling. Neck: Supple, thyroid normal,no  cervical adenopathy  Respiratory: Respiratory effort normal, Breath sounds clear A&P without rhonchi, wheeze, or rale.  No retractions, no accessory usage. Cardio: RRR with no MRGs. Brisk peripheral pulses without edema.  Abdomen: Soft, + BS,  Non tender, no guarding, rebound, hernias, masses. Musculoskeletal: Full ROM, 5/5 strength, Normal gait, Mild TTP of the left lower lumbar spine.  Full ROM of back with minimal pain. Skin: Warm, dry without rashes, lesions, ecchymosis.  Neuro: Awake and oriented X 3, Cranial nerves intact. Normal muscle tone, no cerebellar symptoms. Psych: Normal affect, Insight and Judgment appropriate.    FORCUCCI, Khye Hochstetler, PA-C 10:02 AM [facility]

## 2014-11-22 NOTE — Patient Instructions (Signed)

## 2014-11-23 LAB — LIPID PANEL
CHOLESTEROL: 150 mg/dL (ref 0–200)
HDL: 50 mg/dL (ref >=46)
LDL CALC: 81 mg/dL (ref 0–99)
Total CHOL/HDL Ratio: 3 Ratio
Triglycerides: 97 mg/dL (ref <150)
VLDL: 19 mg/dL (ref 0–40)

## 2014-11-23 LAB — BASIC METABOLIC PANEL WITH GFR
BUN: 25 mg/dL — ABNORMAL HIGH (ref 6–23)
CO2: 28 mEq/L (ref 19–32)
CREATININE: 0.86 mg/dL (ref 0.50–1.10)
Calcium: 8.7 mg/dL (ref 8.4–10.5)
Chloride: 104 mEq/L (ref 96–112)
GFR, EST NON AFRICAN AMERICAN: 68 mL/min
GFR, Est African American: 78 mL/min
Glucose, Bld: 80 mg/dL (ref 70–99)
POTASSIUM: 4.1 meq/L (ref 3.5–5.3)
SODIUM: 137 meq/L (ref 135–145)

## 2014-11-23 LAB — URINE CULTURE
COLONY COUNT: NO GROWTH
ORGANISM ID, BACTERIA: NO GROWTH

## 2014-11-23 LAB — HEPATIC FUNCTION PANEL
ALK PHOS: 104 U/L (ref 39–117)
ALT: 32 U/L (ref 0–35)
AST: 30 U/L (ref 0–37)
Albumin: 3.8 g/dL (ref 3.5–5.2)
BILIRUBIN INDIRECT: 0.5 mg/dL (ref 0.2–1.2)
Bilirubin, Direct: 0.1 mg/dL (ref 0.0–0.3)
TOTAL PROTEIN: 6.5 g/dL (ref 6.0–8.3)
Total Bilirubin: 0.6 mg/dL (ref 0.2–1.2)

## 2014-11-23 LAB — MAGNESIUM: MAGNESIUM: 1.9 mg/dL (ref 1.5–2.5)

## 2014-11-23 LAB — URINALYSIS, ROUTINE W REFLEX MICROSCOPIC
BILIRUBIN URINE: NEGATIVE
Glucose, UA: NEGATIVE mg/dL
Hgb urine dipstick: NEGATIVE
Ketones, ur: NEGATIVE mg/dL
Leukocytes, UA: NEGATIVE
NITRITE: NEGATIVE
PROTEIN: NEGATIVE mg/dL
SPECIFIC GRAVITY, URINE: 1.008 (ref 1.005–1.030)
UROBILINOGEN UA: 0.2 mg/dL (ref 0.0–1.0)
pH: 6.5 (ref 5.0–8.0)

## 2014-11-23 LAB — VITAMIN D 25 HYDROXY (VIT D DEFICIENCY, FRACTURES): Vit D, 25-Hydroxy: 70 ng/mL (ref 30–100)

## 2014-11-23 LAB — INSULIN, FASTING: INSULIN FASTING, SERUM: 4.7 u[IU]/mL (ref 2.0–19.6)

## 2014-11-23 LAB — TSH: TSH: 1.039 u[IU]/mL (ref 0.350–4.500)

## 2015-01-08 ENCOUNTER — Encounter: Payer: Self-pay | Admitting: Internal Medicine

## 2015-02-18 ENCOUNTER — Encounter: Payer: Self-pay | Admitting: Internal Medicine

## 2015-03-27 ENCOUNTER — Encounter: Payer: Self-pay | Admitting: Internal Medicine

## 2015-04-19 ENCOUNTER — Other Ambulatory Visit: Payer: Self-pay | Admitting: Internal Medicine

## 2015-04-19 ENCOUNTER — Other Ambulatory Visit: Payer: Self-pay | Admitting: Emergency Medicine

## 2015-04-25 ENCOUNTER — Ambulatory Visit (INDEPENDENT_AMBULATORY_CARE_PROVIDER_SITE_OTHER): Payer: PPO | Admitting: Internal Medicine

## 2015-04-25 ENCOUNTER — Encounter: Payer: Self-pay | Admitting: Internal Medicine

## 2015-04-25 VITALS — BP 126/70 | HR 64 | Temp 97.5°F | Resp 16 | Ht 63.0 in | Wt 188.4 lb

## 2015-04-25 DIAGNOSIS — R7303 Prediabetes: Secondary | ICD-10-CM

## 2015-04-25 DIAGNOSIS — Z79899 Other long term (current) drug therapy: Secondary | ICD-10-CM | POA: Diagnosis not present

## 2015-04-25 DIAGNOSIS — Z1212 Encounter for screening for malignant neoplasm of rectum: Secondary | ICD-10-CM

## 2015-04-25 DIAGNOSIS — Z1389 Encounter for screening for other disorder: Secondary | ICD-10-CM

## 2015-04-25 DIAGNOSIS — Z789 Other specified health status: Secondary | ICD-10-CM | POA: Diagnosis not present

## 2015-04-25 DIAGNOSIS — E663 Overweight: Secondary | ICD-10-CM | POA: Insufficient documentation

## 2015-04-25 DIAGNOSIS — Z9181 History of falling: Secondary | ICD-10-CM

## 2015-04-25 DIAGNOSIS — R7309 Other abnormal glucose: Secondary | ICD-10-CM

## 2015-04-25 DIAGNOSIS — Z6833 Body mass index (BMI) 33.0-33.9, adult: Secondary | ICD-10-CM

## 2015-04-25 DIAGNOSIS — E559 Vitamin D deficiency, unspecified: Secondary | ICD-10-CM

## 2015-04-25 DIAGNOSIS — Z1331 Encounter for screening for depression: Secondary | ICD-10-CM

## 2015-04-25 DIAGNOSIS — I10 Essential (primary) hypertension: Secondary | ICD-10-CM

## 2015-04-25 DIAGNOSIS — E782 Mixed hyperlipidemia: Secondary | ICD-10-CM

## 2015-04-25 DIAGNOSIS — Z0000 Encounter for general adult medical examination without abnormal findings: Secondary | ICD-10-CM

## 2015-04-25 LAB — CBC WITH DIFFERENTIAL/PLATELET
BASOS ABS: 0.1 10*3/uL (ref 0.0–0.1)
Basophils Relative: 1 % (ref 0–1)
EOS PCT: 4 % (ref 0–5)
Eosinophils Absolute: 0.2 10*3/uL (ref 0.0–0.7)
HEMATOCRIT: 39.4 % (ref 36.0–46.0)
Hemoglobin: 13.1 g/dL (ref 12.0–15.0)
LYMPHS ABS: 1.7 10*3/uL (ref 0.7–4.0)
LYMPHS PCT: 30 % (ref 12–46)
MCH: 29.8 pg (ref 26.0–34.0)
MCHC: 33.2 g/dL (ref 30.0–36.0)
MCV: 89.5 fL (ref 78.0–100.0)
MONO ABS: 0.6 10*3/uL (ref 0.1–1.0)
MPV: 11.2 fL (ref 8.6–12.4)
Monocytes Relative: 11 % (ref 3–12)
Neutro Abs: 3 10*3/uL (ref 1.7–7.7)
Neutrophils Relative %: 54 % (ref 43–77)
Platelets: 288 10*3/uL (ref 150–400)
RBC: 4.4 MIL/uL (ref 3.87–5.11)
RDW: 14.6 % (ref 11.5–15.5)
WBC: 5.6 10*3/uL (ref 4.0–10.5)

## 2015-04-25 LAB — LIPID PANEL
CHOL/HDL RATIO: 2.7 ratio (ref <=5.0)
CHOLESTEROL: 145 mg/dL (ref 125–200)
HDL: 54 mg/dL (ref >=46)
LDL CALC: 72 mg/dL (ref <130)
Triglycerides: 97 mg/dL (ref <150)
VLDL: 19 mg/dL (ref <30)

## 2015-04-25 LAB — MAGNESIUM: Magnesium: 2 mg/dL (ref 1.5–2.5)

## 2015-04-25 LAB — HEMOGLOBIN A1C
HEMOGLOBIN A1C: 5.8 % — AB (ref <5.7)
MEAN PLASMA GLUCOSE: 120 mg/dL — AB (ref <117)

## 2015-04-25 LAB — HEPATIC FUNCTION PANEL
ALBUMIN: 4.1 g/dL (ref 3.6–5.1)
ALK PHOS: 77 U/L (ref 33–130)
ALT: 26 U/L (ref 6–29)
AST: 32 U/L (ref 10–35)
Bilirubin, Direct: 0.2 mg/dL (ref <=0.2)
Indirect Bilirubin: 0.5 mg/dL (ref 0.2–1.2)
TOTAL PROTEIN: 6.6 g/dL (ref 6.1–8.1)
Total Bilirubin: 0.7 mg/dL (ref 0.2–1.2)

## 2015-04-25 LAB — BASIC METABOLIC PANEL WITH GFR
BUN: 26 mg/dL — AB (ref 7–25)
CO2: 28 mmol/L (ref 20–31)
Calcium: 9.3 mg/dL (ref 8.6–10.4)
Chloride: 101 mmol/L (ref 98–110)
Creat: 0.95 mg/dL — ABNORMAL HIGH (ref 0.60–0.93)
GFR, EST AFRICAN AMERICAN: 69 mL/min (ref >=60)
GFR, Est Non African American: 60 mL/min (ref >=60)
GLUCOSE: 90 mg/dL (ref 65–99)
POTASSIUM: 4.1 mmol/L (ref 3.5–5.3)
Sodium: 141 mmol/L (ref 135–146)

## 2015-04-25 LAB — TSH: TSH: 1.29 u[IU]/mL (ref 0.350–4.500)

## 2015-04-25 NOTE — Progress Notes (Signed)
Patient ID: Gwendolyn [name], female   DOB: Jun 12, 1941, 74 y.o.   MRN: [id number]   Comprehensive Examination  This very nice 74 y.o. [name] presents for complete physical.  Patient has been followed for HTN, Prediabetes, Hyperlipidemia, and Vitamin D Deficiency. 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     HTN predates since 2008. Patient's BP has been controlled at home and patient denies any cardiac symptoms as chest pain, palpitations, shortness of breath, dizziness or ankle swelling. Today's BP: 126/70 mmHg    Patient's hyperlipidemia is controlled with diet and medications. Patient denies myalgias or other medication SE's. Last lipids were at goal - Cholesterol 145; HDL 54; LDL 72; Triglycerides 97 on 04/25/2015.   Patient has prediabetes predating since May 2011 with A1c 6.0% and patient denies reactive hypoglycemic symptoms, visual blurring, diabetic polys, or paresthesias. Last A1c was 6.0% on 11/22/2014.    Finally, patient has history of Vitamin D Deficiency and last Vitamin D was 70 on 11/22/2014      Medication Sig  . ALPRAZolam  0.5 MG tab TAKE ONE-HALF TO ONE TAB THREE TIMES DAILY AS NEEDED FOR ANXIETY  . aspirin EC 81 MG tab Take 162 mg  at bedtime.  Marland Kitchen atorvastatin  80 MG tab Take 1 tab daily.  Marland Kitchen VITAMIN D 1000 UNITS tab Take 1,000 Units  daily.   Marland Kitchen CINNAMON  Take 500 mg  2 ( times daily.  Marland Kitchen estradiol  1 MG tab TAKE 1 TAB EVERY DAY  . Flaxseed Take 1,000 mg  2 times daily.   . Furosemide 80 MG tab Take 1 tablet  daily.  Marland Kitchen losartan-hctz (HYZAAR) 50-12.5 MG  TAKE 1 TABL EVERY DAY  . LYRICA 150 MG cap TAKE ONE CAPS THREE TIMES DAILY  . medroxyPROGESTERone (PROVERA) 2.5 MG  TAKE 1 TAB EVERY DAY  . montelukast  10 MG tab TAKE 1 TAB ONCE DAILY  . Multiple Vitamin  Take 1 tab daily.  . Omega 1200 MG CAPS Take 2  times daily.  Marland Kitchen pyridOXINE ( B-6) 100 MG tab Take 100 mg  2  times daily.   Allergies  Allergen Reactions  . Oxycodone-Acetaminophen  Other (See Comments)    panic attack  . Vit C-Cholecalciferol-Rose Hip [Cholecalciferol-Vitamin C] Other (See Comments)    dyspepsia  . Ibuprofen [Ibuprofen] Rash  . Macrodantin [Nitrofurantoin Macrocrystal] Rash   Past Medical History  Diagnosis Date  . Hypertension   . Arthritis   . Anemia   . 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 Pelvis fracture March 26, 2002    both sides fx  . Hyperlipidemia   . Pre-diabetes   . Vitamin D deficiency    Health Maintenance  Topic Date Due  . COLONOSCOPY  01/14/1992  . INFLUENZA VACCINE  04/01/2015  . MAMMOGRAM  05/30/2016  . TETANUS/TDAP  01/27/2022  . DEXA SCAN  Completed  . ZOSTAVAX  Completed  . PNA vac Low Risk Adult  Completed   Immunization History  Administered Date(s) Administered  . Influenza, High Dose Seasonal PF 06/06/2014  . Influenza-Unspecified 06/13/2013  . Pneumococcal Conjugate-13 05/24/2014  . Pneumococcal Polysaccharide-23 01/08/2009  . Tdap 01/28/2012  . Zoster 08/31/2006   Past Surgical History  Procedure Laterality Date  . Right shoulder humerus fx  july 2003    surgery done  . Right hand ulner nerve surgery  March 26, 2002    I  and d done right hand/wrist  . Right elbow   surgery  March 26, 2002  . Right wrist plating  2004  . Right total hip arthroplasty  2008  . 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 Tonsillectomy  age 11  . Total hip arthroplasty  10/22/2011    Procedure: TOTAL HIP ARTHROPLASTY ANTERIOR APPROACH;  Surgeon: Mauri Pole, MD;  Location: [facility];  Service: Orthopedics;  Laterality: Left;  Left Total Hip Arthroplasty,  Anterior Approach    Family History  Problem Relation Age of Onset  . Hypertension Mother   . Diabetes Mother   . Heart disease Mother   . Diabetes Father   . Heart disease Father    Social History  Substance Use Topics  . Smoking status: Never Smoker   . Smokeless tobacco: Never Used  . Alcohol Use: Yes     Comment: social    ROS Constitutional:  Denies fever, chills, weight loss/gain, headaches, insomnia,  night sweats, and change in appetite. Does c/o fatigue. Eyes: Denies redness, blurred vision, diplopia, discharge, itchy, watery eyes.  ENT: Denies discharge, congestion, post nasal drip, epistaxis, sore throat, earache, hearing loss, dental pain, Tinnitus, Vertigo, Sinus pain, snoring.  Cardio: Denies chest pain, palpitations, irregular heartbeat, syncope, dyspnea, diaphoresis, orthopnea, PND, claudication, edema Respiratory: denies cough, dyspnea, DOE, pleurisy, hoarseness, laryngitis, wheezing.  Gastrointestinal: Denies dysphagia, heartburn, reflux, water brash, pain, cramps, nausea, vomiting, bloating, diarrhea, constipation, hematemesis, melena, hematochezia, jaundice, hemorrhoids Genitourinary: Denies dysuria, frequency, urgency, nocturia, hesitancy, discharge, hematuria, flank pain Breast: Breast lumps, nipple discharge, bleeding.  Musculoskeletal: Denies arthralgia, myalgia, stiffness, Jt. Swelling, pain, limp, and strain/sprain. Denies falls. Skin: Denies puritis, rash, hives, warts, acne, eczema, changing in skin lesion Neuro: No weakness, tremor, incoordination, spasms, paresthesia, pain Psychiatric: Denies confusion, memory loss, sensory loss. Denies Depression. Endocrine: Denies change in weight, skin, hair change, nocturia, and paresthesia, diabetic polys, visual blurring, hyper / hypo glycemic episodes.  Heme/Lymph: No excessive bleeding, bruising, enlarged lymph nodes.  Physical Exam  BP 126/70   Pulse 64  Temp 97.5 F   Resp 16  Ht 5\' 3"    Wt 188 lb 6.4 oz     BMI 33.38   General Appearance: Well nourished and in no apparent distress. Eyes: PERRLA, EOMs, conjunctiva no swelling or erythema, normal fundi and vessels. Sinuses: No frontal/maxillary tenderness ENT/Mouth: EACs patent / TMs  nl. Nares clear without erythema, swelling, mucoid exudates. Oral hygiene is good. No erythema, swelling, or exudate. Tongue  normal, non-obstructing. Tonsils not swollen or erythematous. Hearing normal.  Neck: Supple, thyroid normal. No bruits, nodes or JVD. Respiratory: Respiratory effort normal.  BS equal and clear bilateral without rales, rhonci, wheezing or stridor. Cardio: Heart sounds are normal with regular rate and rhythm and no murmurs, rubs or gallops. Peripheral pulses are normal and equal bilaterally without edema. No aortic or femoral bruits. Chest: symmetric with normal excursions and percussion. Breasts: Symmetric, without lumps, nipple discharge, retractions, or fibrocystic changes.  Abdomen: Flat, soft, with bowel sounds. Nontender, no guarding, rebound, hernias, masses, or organomegaly.  Lymphatics: Non tender without lymphadenopathy.  Musculoskeletal: Full ROM all peripheral extremities, joint stability, 5/5 strength, and normal gait. Skin: Warm and dry without rashes, lesions, cyanosis, clubbing or  ecchymosis.  Neuro: Cranial nerves intact, reflexes equal bilaterally. Normal muscle tone, no cerebellar symptoms. Sensation intact.  Pysch: Awake and oriented X 3, normal affect, Insight and Judgment appropriate.   Assessment and Plan  1. Essential hypertension  - EKG  12-Lead - [place], RETROPERITNL ABD,  LTD - TSH - Microalbumin / creatinine urine ratio  2. Hyperlipidemia  - Lipid panel  3. Prediabetes  - Hemoglobin A1c - Insulin, random  4. Vitamin D deficiency  - Vit D  25 hydroxy   5. Screening for rectal cancer  - POC Hemoccult Bld/Stl   6. Medication management  - CBC with Differential/Platelet - BASIC METABOLIC PANEL WITH GFR - Hepatic function panel - Magnesium - Urine Microscopic  7. BMI 33.0-33.9,adult   8. Depression screen   9. At low risk for fall   Continue prudent diet as discussed, weight control, BP monitoring, regular exercise, and medications. Discussed med's effects and SE's. Screening labs and tests as requested with regular follow-up as recommended.   Over 40 minutes of exam, counseling, chart review was performed.

## 2015-04-25 NOTE — Patient Instructions (Signed)
Recommend Adult Low dose Aspirin or   coated  Aspirin 81 mg daily   To reduce risk of Colon Cancer 20 %,   Skin Cancer 26 % ,   Melanoma 46%   and   Pancreatic cancer 60%  ++++++++++++++++++  Vitamin D goal   is between 70-100.   Please make sure that you are taking your Vitamin D as directed.   It is very important as a natural anti-inflammatory   helping hair, skin, and nails, as well as reducing stroke and heart attack risk.   It helps your bones and helps with mood.  It also decreases numerous cancer risks so please take it as directed.   Low Vit D is associated with a 200-300% higher risk for CANCER   and 200-300% higher risk for HEART   ATTACK  &  STROKE.   ......................................  It is also associated with higher death rate at younger ages,   autoimmune diseases like Rheumatoid arthritis, Lupus, Multiple Sclerosis.     Also many other serious conditions, like depression, Alzheimer's  Dementia, infertility, muscle aches, fatigue, fibromyalgia - just to name a few.  +++++++++++++++++++  Recommend the book "The END of DIETING" by Gwendolyn [name]   & the book "The END of DIABETES " by Gwendolyn [name]  At [employer] - get book & Audio CD's     Being diabetic has a  300% increased risk for heart attack, stroke, cancer, and alzheimer- type vascular dementia. It is very important that you work harder with diet by avoiding all foods that are white. Avoid white rice (brown & wild rice is OK), white potatoes (sweetpotatoes in moderation is OK), White bread or wheat bread or anything made out of white flour like bagels, donuts, rolls, buns, biscuits, cakes, pastries, cookies, pizza crust, and pasta (made from white flour & egg whites) - vegetarian pasta or spinach or wheat pasta is OK. Multigrain breads like Arnold's or [employer], or multigrain sandwich thins or flatbreads.  Diet, exercise and weight loss can reverse and cure diabetes in the early  stages.  Diet, exercise and weight loss is very important in the control and prevention of complications of diabetes which affects every system in your body, ie. Brain - dementia/stroke, eyes - glaucoma/blindness, heart - heart attack/heart failure, kidneys - dialysis, stomach - gastric paralysis, intestines - malabsorption, nerves - severe painful neuritis, circulation - gangrene & loss of a leg(s), and finally cancer and Alzheimers.    I recommend avoid fried & greasy foods,  sweets/candy, white rice (brown or wild rice or Quinoa is OK), white potatoes (sweet potatoes are OK) - anything made from white flour - bagels, doughnuts, rolls, buns, biscuits,white and wheat breads, pizza crust and traditional pasta made of white flour & egg white(vegetarian pasta or spinach or wheat pasta is OK).  Multi-grain bread is OK - like multi-grain flat bread or sandwich thins. Avoid alcohol in excess. Exercise is also important.    Eat all the vegetables you want - avoid meat, especially red meat and dairy - especially cheese.  Cheese is the most concentrated form of trans-fats which is the worst thing to clog up our arteries. Veggie cheese is OK which can be found in the fresh produce section at [employer] or [employer] or [employer]  ++++++++++++++++++++++++++   Preventive Care for Adults  A healthy lifestyle and preventive care can promote health and wellness. Preventive health guidelines for women include the following key practices.  A routine   to check with your health care provider about your health and preventive screening. It is a chance to share any concerns and updates on your health and to receive a thorough exam.  Visit your dentist for a routine exam and preventive care every 6 months. Brush your teeth twice a day and floss once a day. Good oral hygiene prevents tooth decay and gum disease.  The frequency of eye exams is based on your age, health, family medical history, use  of contact lenses, and other factors. Follow your health care provider's recommendations for frequency of eye exams.  Eat a healthy diet. Foods like vegetables, fruits, whole grains, low-fat dairy products, and lean protein foods contain the nutrients you need without too many calories. Decrease your intake of foods high in solid fats, added sugars, and salt. Eat the right amount of calories for you.Get information about a proper diet from your health care provider, if necessary.  Regular physical exercise is one of the most important things you can do for your health. Most adults should get at least 150 minutes of moderate-intensity exercise (any activity that increases your heart rate and causes you to sweat) each week. In addition, most adults need muscle-strengthening exercises on 2 or more days a week.  Maintain a healthy weight. The body mass index (BMI) is a screening tool to identify possible weight problems. It provides an estimate of body fat based on height and weight. Your health care provider can find your BMI and can help you achieve or maintain a healthy weight.For adults 20 years and older:  A BMI below 18.5 is considered underweight.  A BMI of 18.5 to 24.9 is normal.  A BMI of 25 to 29.9 is considered overweight.  A BMI of 30 and above is considered obese.  Maintain normal blood lipids and cholesterol levels by exercising and minimizing your intake of saturated fat. Eat a balanced diet with plenty of fruit and vegetables. If your lipid or cholesterol levels are high, you are over 50, or you are at high risk for heart disease, you may need your cholesterol levels checked more frequently.Ongoing high lipid and cholesterol levels should be treated with medicines if diet and exercise are not working.  If you smoke, find out from your health care provider how to quit. If you do not use tobacco, do not start.  Lung cancer screening is recommended for adults aged 55-80 years who are  at high risk for developing lung cancer because of a history of smoking. A yearly low-dose CT scan of the lungs is recommended for people who have at least a 30-pack-year history of smoking and are a current smoker or have quit within the past 15 years. A pack year of smoking is smoking an average of 1 pack of cigarettes a day for 1 year (for example: 1 pack a day for 30 years or 2 packs a day for 15 years). Yearly screening should continue until the smoker has stopped smoking for at least 15 years. Yearly screening should be stopped for people who develop a health problem that would prevent them from having lung cancer treatment.  Avoid use of street drugs. Do not share needles with anyone. Ask for help if you need support or instructions about stopping the use of drugs.  High blood pressure causes heart disease and increases the risk of stroke.  Ongoing high blood pressure should be treated with medicines if weight loss and exercise do not work.  If you are 55-79   years old, ask your health care provider if you should take aspirin to prevent strokes.  Diabetes screening involves taking a blood sample to check your fasting blood sugar level. This should be done once every 3 years, after age 45, if you are within normal weight and without risk factors for diabetes. Testing should be considered at a younger age or be carried out more frequently if you are overweight and have at least 1 risk factor for diabetes.  Breast cancer screening is essential preventive care for women. You should practice "breast self-awareness." This means understanding the normal appearance and feel of your breasts and may include breast self-examination. Any changes detected, no matter how small, should be reported to a health care provider. Women in their 20s and 30s should have a clinical breast exam (CBE) by a health care provider as part of a regular health exam every 1 to 3 years. After age 40, women should have a CBE every  year. Starting at age 40, women should consider having a mammogram (breast X-ray test) every year. Women who have a family history of breast cancer should talk to their health care provider about genetic screening. Women at a high risk of breast cancer should talk to their health care providers about having an MRI and a mammogram every year.  Breast cancer gene (BRCA)-related cancer risk assessment is recommended for women who have family members with BRCA-related cancers. BRCA-related cancers include breast, ovarian, tubal, and peritoneal cancers. Having family members with these cancers may be associated with an increased risk for harmful changes (mutations) in the breast cancer genes BRCA1 and BRCA2. Results of the assessment will determine the need for genetic counseling and BRCA1 and BRCA2 testing.  Routine pelvic exams to screen for cancer are no longer recommended for nonpregnant women who are considered low risk for cancer of the pelvic organs (ovaries, uterus, and vagina) and who do not have symptoms. Ask your health care provider if a screening pelvic exam is right for you.  If you have had past treatment for cervical cancer or a condition that could lead to cancer, you need Pap tests and screening for cancer for at least 20 years after your treatment. If Pap tests have been discontinued, your risk factors (such as having a new sexual partner) need to be reassessed to determine if screening should be resumed. Some women have medical problems that increase the chance of getting cervical cancer. In these cases, your health care provider may recommend more frequent screening and Pap tests.    Colorectal cancer can be detected and often prevented. Most routine colorectal cancer screening begins at the age of 50 years and continues through age 75 years. However, your health care provider may recommend screening at an earlier age if you have risk factors for colon cancer. On a yearly basis, your health  care provider may provide home test kits to check for hidden blood in the stool. Use of a small camera at the end of a tube, to directly examine the colon (sigmoidoscopy or colonoscopy), can detect the earliest forms of colorectal cancer. Talk to your health care provider about this at age 50, when routine screening begins. Direct exam of the colon should be repeated every 5-10 years through age 75 years, unless early forms of pre-cancerous polyps or small growths are found.  Osteoporosis is a disease in which the bones lose minerals and strength with aging. This can result in serious bone fractures or breaks. The risk of osteoporosis   can be identified using a bone density scan. Women ages 65 years and over and women at risk for fractures or osteoporosis should discuss screening with their health care providers. Ask your health care provider whether you should take a calcium supplement or vitamin D to reduce the rate of osteoporosis.  Menopause can be associated with physical symptoms and risks. Hormone replacement therapy is available to decrease symptoms and risks. You should talk to your health care provider about whether hormone replacement therapy is right for you.  Use sunscreen. Apply sunscreen liberally and repeatedly throughout the day. You should seek shade when your shadow is shorter than you. Protect yourself by wearing long sleeves, pants, a wide-brimmed hat, and sunglasses year round, whenever you are outdoors.  Once a month, do a whole body skin exam, using a mirror to look at the skin on your back. Tell your health care provider of new moles, moles that have irregular borders, moles that are larger than a pencil eraser, or moles that have changed in shape or color.  Stay current with required vaccines (immunizations).  Influenza vaccine. All adults should be immunized every year.  Tetanus, diphtheria, and acellular pertussis (Td, Tdap) vaccine. Pregnant women should receive 1 dose of  Tdap vaccine during each pregnancy. The dose should be obtained regardless of the length of time since the last dose. Immunization is preferred during the 27th-36th week of gestation. An adult who has not previously received Tdap or who does not know her vaccine status should receive 1 dose of Tdap. This initial dose should be followed by tetanus and diphtheria toxoids (Td) booster doses every 10 years. Adults with an unknown or incomplete history of completing a 3-dose immunization series with Td-containing vaccines should begin or complete a primary immunization series including a Tdap dose. Adults should receive a Td booster every 10 years.    Zoster vaccine. One dose is recommended for adults aged 60 years or older unless certain conditions are present.    Pneumococcal 13-valent conjugate (PCV13) vaccine. When indicated, a person who is uncertain of her immunization history and has no record of immunization should receive the PCV13 vaccine. An adult aged 19 years or older who has certain medical conditions and has not been previously immunized should receive 1 dose of PCV13 vaccine. This PCV13 should be followed with a dose of pneumococcal polysaccharide (PPSV23) vaccine. The PPSV23 vaccine dose should be obtained at least 8 weeks after the dose of PCV13 vaccine. An adult aged 19 years or older who has certain medical conditions and previously received 1 or more doses of PPSV23 vaccine should receive 1 dose of PCV13. The PCV13 vaccine dose should be obtained 1 or more years after the last PPSV23 vaccine dose.    Pneumococcal polysaccharide (PPSV23) vaccine. When PCV13 is also indicated, PCV13 should be obtained first. All adults aged 65 years and older should be immunized. An adult younger than age 65 years who has certain medical conditions should be immunized. Any person who resides in a nursing home or long-term care facility should be immunized. An adult smoker should be immunized. People with an  immunocompromised condition and certain other conditions should receive both PCV13 and PPSV23 vaccines. People with human immunodeficiency virus (HIV) infection should be immunized as soon as possible after diagnosis. Immunization during chemotherapy or radiation therapy should be avoided. Routine use of PPSV23 vaccine is not recommended for American Indians, [place] Natives, or people younger than 65 years unless there are medical conditions that require   PPSV23 vaccine. When indicated, people who have unknown immunization and have no record of immunization should receive PPSV23 vaccine. One-time revaccination 5 years after the first dose of PPSV23 is recommended for people aged 19-64 years who have chronic kidney failure, nephrotic syndrome, asplenia, or immunocompromised conditions. People who received 1-2 doses of PPSV23 before age 65 years should receive another dose of PPSV23 vaccine at age 65 years or later if at least 5 years have passed since the previous dose. Doses of PPSV23 are not needed for people immunized with PPSV23 at or after age 65 years.   Preventive Services / Frequency  Ages 65 years and over  Blood pressure check.  Lipid and cholesterol check.  Lung cancer screening. / Every year if you are aged 55-80 years and have a 30-pack-year history of smoking and currently smoke or have quit within the past 15 years. Yearly screening is stopped once you have quit smoking for at least 15 years or develop a health problem that would prevent you from having lung cancer treatment.  Clinical breast exam.** / Every year after age 40 years.  BRCA-related cancer risk assessment.** / For women who have family members with a BRCA-related cancer (breast, ovarian, tubal, or peritoneal cancers).  Mammogram.** / Every year beginning at age 40 years and continuing for as long as you are in good health. Consult with your health care provider.  Pap test.** / Every 3 years starting at age 30 years  through age 65 or 70 years with 3 consecutive normal Pap tests. Testing can be stopped between 65 and 70 years with 3 consecutive normal Pap tests and no abnormal Pap or HPV tests in the past 10 years.  Fecal occult blood test (FOBT) of stool. / Every year beginning at age 50 years and continuing until age 75 years. You may not need to do this test if you get a colonoscopy every 10 years.  Flexible sigmoidoscopy or colonoscopy.** / Every 5 years for a flexible sigmoidoscopy or every 10 years for a colonoscopy beginning at age 50 years and continuing until age 75 years.  Hepatitis C blood test.** / For all people born from 1945 through 1965 and any individual with known risks for hepatitis C.  Osteoporosis screening.** / A one-time screening for women ages 65 years and over and women at risk for fractures or osteoporosis.  Skin self-exam. / Monthly.  Influenza vaccine. / Every year.  Tetanus, diphtheria, and acellular pertussis (Tdap/Td) vaccine.** / 1 dose of Td every 10 years.  Zoster vaccine.** / 1 dose for adults aged 60 years or older.  Pneumococcal 13-valent conjugate (PCV13) vaccine.** / Consult your health care provider.  Pneumococcal polysaccharide (PPSV23) vaccine.** / 1 dose for all adults aged 65 years and older. Screening for abdominal aortic aneurysm (AAA)  by ultrasound is recommended for people who have history of high blood pressure or who are current or former smokers. 

## 2015-04-26 LAB — MICROALBUMIN / CREATININE URINE RATIO
Creatinine, Urine: 102.7 mg/dL
MICROALB UR: 0.4 mg/dL (ref <2.0)
MICROALB/CREAT RATIO: 3.9 mg/g (ref 0.0–30.0)

## 2015-04-26 LAB — VITAMIN D 25 HYDROXY (VIT D DEFICIENCY, FRACTURES): Vit D, 25-Hydroxy: 79 ng/mL (ref 30–100)

## 2015-04-26 LAB — URINALYSIS, MICROSCOPIC ONLY
CASTS: NONE SEEN [LPF]
Crystals: NONE SEEN [HPF]
RBC / HPF: NONE SEEN RBC/HPF (ref <=2)
YEAST: NONE SEEN [HPF]

## 2015-04-26 LAB — INSULIN, RANDOM: Insulin: 7.2 u[IU]/mL (ref 2.0–19.6)

## 2015-05-27 ENCOUNTER — Other Ambulatory Visit: Payer: Self-pay

## 2015-05-27 DIAGNOSIS — Z1231 Encounter for screening mammogram for malignant neoplasm of breast: Secondary | ICD-10-CM

## 2015-06-18 ENCOUNTER — Ambulatory Visit: Payer: Self-pay

## 2015-06-18 ENCOUNTER — Ambulatory Visit
Admission: RE | Admit: 2015-06-18 | Discharge: 2015-06-18 | Disposition: A | Discharge status: Home / Self Care | Payer: PPO | Source: Ambulatory Visit

## 2015-06-18 DIAGNOSIS — Z1231 Encounter for screening mammogram for malignant neoplasm of breast: Secondary | ICD-10-CM

## 2015-06-29 ENCOUNTER — Other Ambulatory Visit: Payer: Self-pay | Admitting: Internal Medicine

## 2015-07-22 ENCOUNTER — Other Ambulatory Visit: Payer: Self-pay | Admitting: Internal Medicine

## 2015-08-07 ENCOUNTER — Ambulatory Visit (INDEPENDENT_AMBULATORY_CARE_PROVIDER_SITE_OTHER): Payer: PPO | Admitting: Internal Medicine

## 2015-08-07 ENCOUNTER — Encounter: Payer: Self-pay | Admitting: Internal Medicine

## 2015-08-07 VITALS — BP 106/64 | HR 72 | Temp 98.2°F | Resp 16 | Ht 63.0 in | Wt 172.0 lb

## 2015-08-07 DIAGNOSIS — I10 Essential (primary) hypertension: Secondary | ICD-10-CM

## 2015-08-07 DIAGNOSIS — Z79899 Other long term (current) drug therapy: Secondary | ICD-10-CM

## 2015-08-07 DIAGNOSIS — E559 Vitamin D deficiency, unspecified: Secondary | ICD-10-CM | POA: Diagnosis not present

## 2015-08-07 DIAGNOSIS — E782 Mixed hyperlipidemia: Secondary | ICD-10-CM

## 2015-08-07 DIAGNOSIS — R7303 Prediabetes: Secondary | ICD-10-CM

## 2015-08-07 LAB — CBC WITH DIFFERENTIAL/PLATELET
BASOS ABS: 0.1 10*3/uL (ref 0.0–0.1)
Basophils Relative: 1 % (ref 0–1)
Eosinophils Absolute: 0.2 10*3/uL (ref 0.0–0.7)
Eosinophils Relative: 4 % (ref 0–5)
HEMATOCRIT: 40.6 % (ref 36.0–46.0)
HEMOGLOBIN: 13.3 g/dL (ref 12.0–15.0)
LYMPHS PCT: 29 % (ref 12–46)
Lymphs Abs: 1.7 10*3/uL (ref 0.7–4.0)
MCH: 29.8 pg (ref 26.0–34.0)
MCHC: 32.8 g/dL (ref 30.0–36.0)
MCV: 90.8 fL (ref 78.0–100.0)
MPV: 11.5 fL (ref 8.6–12.4)
Monocytes Absolute: 0.6 10*3/uL (ref 0.1–1.0)
Monocytes Relative: 10 % (ref 3–12)
NEUTROS ABS: 3.3 10*3/uL (ref 1.7–7.7)
Neutrophils Relative %: 56 % (ref 43–77)
Platelets: 316 10*3/uL (ref 150–400)
RBC: 4.47 MIL/uL (ref 3.87–5.11)
RDW: 14.8 % (ref 11.5–15.5)
WBC: 5.9 10*3/uL (ref 4.0–10.5)

## 2015-08-07 LAB — BASIC METABOLIC PANEL WITH GFR
BUN: 24 mg/dL (ref 7–25)
CO2: 26 mmol/L (ref 20–31)
Calcium: 9.5 mg/dL (ref 8.6–10.4)
Chloride: 102 mmol/L (ref 98–110)
Creat: 0.89 mg/dL (ref 0.60–0.93)
GFR, EST NON AFRICAN AMERICAN: 65 mL/min (ref >=60)
GFR, Est African American: 74 mL/min (ref >=60)
GLUCOSE: 98 mg/dL (ref 65–99)
POTASSIUM: 3.9 mmol/L (ref 3.5–5.3)
Sodium: 139 mmol/L (ref 135–146)

## 2015-08-07 LAB — HEPATIC FUNCTION PANEL
ALK PHOS: 112 U/L (ref 33–130)
ALT: 27 U/L (ref 6–29)
AST: 33 U/L (ref 10–35)
Albumin: 4.3 g/dL (ref 3.6–5.1)
BILIRUBIN INDIRECT: 0.5 mg/dL (ref 0.2–1.2)
Bilirubin, Direct: 0.1 mg/dL (ref <=0.2)
TOTAL PROTEIN: 7 g/dL (ref 6.1–8.1)
Total Bilirubin: 0.6 mg/dL (ref 0.2–1.2)

## 2015-08-07 LAB — TSH: TSH: 1.3 u[IU]/mL (ref 0.350–4.500)

## 2015-08-07 LAB — LIPID PANEL
CHOL/HDL RATIO: 2.9 ratio (ref <=5.0)
CHOLESTEROL: 165 mg/dL (ref 125–200)
HDL: 56 mg/dL (ref >=46)
LDL Cholesterol: 91 mg/dL (ref <130)
Triglycerides: 89 mg/dL (ref <150)
VLDL: 18 mg/dL (ref <30)

## 2015-08-07 MED ORDER — POTASSIUM CHLORIDE CRYS ER 20 MEQ PO TBCR: 20.0000 meq | EXTENDED_RELEASE_TABLET | Freq: Every day | ORAL | Status: DC

## 2015-08-07 NOTE — Addendum Note (Signed)
Addended by: Lexia Vandevender A on: 08/07/2015 10:17 AM   Modules accepted: Orders

## 2015-08-07 NOTE — Progress Notes (Signed)
Patient ID: Gwendolyn [name], female   DOB: [birth year], 74 y.o.   MRN: [id number]  Assessment and Plan:  Hypertension:  -cut losartan HCTZ in half, take whole if consistently over 150/90 -take potassium only when using lasix -Continue medication,  -monitor blood pressure at home.  -Continue DASH diet.   -Reminder to go to the ER if any CP, SOB, nausea, dizziness, severe HA, changes vision/speech, left arm numbness and tingling, and jaw pain.  Cholesterol: -2 days a week of atorvastatin -Continue diet and exercise.  -Check cholesterol.   Pre-diabetes: -Continue diet and exercise.  -Check A1C  Vitamin D Def: -check level -continue medications.     Continue diet and meds as discussed. Further disposition pending results of labs.  HPI 74 y.o. female  presents for 3 month follow up with hypertension, hyperlipidemia, prediabetes and vitamin D.   Her blood pressure has been controlled at home, today their BP is BP: 106/64 mmHg.   She does workout. She denies chest pain, shortness of breath, dizziness.  Sge is currently doing aerobics and is also doing weight watchers.  She reports that she is down 23 lbs.     She is on cholesterol medication and denies myalgias. Her cholesterol is at goal. The cholesterol last visit was:   Lab Results  Component Value Date   CHOL 145 04/25/2015   HDL 54 04/25/2015   LDLCALC 72 04/25/2015   TRIG 97 04/25/2015   CHOLHDL 2.7 04/25/2015     She has been working on diet and exercise for prediabetes, and denies foot ulcerations, hyperglycemia, hypoglycemia , increased appetite, nausea, paresthesia of the feet, polydipsia, polyuria, visual disturbances, vomiting and weight loss. Last A1C in the office was:  Lab Results  Component Value Date   HGBA1C 5.8* 04/25/2015    Patient is on Vitamin D supplement.  Lab Results  Component Value Date   VD25OH 69 04/25/2015     She reports that she did miss a couple doses of lyrica and had nearly  withdrawal symptoms.    Current Medications:  Current Outpatient Prescriptions on File Prior to Visit  Medication Sig Dispense Refill  . ALPRAZolam (XANAX) 0.5 MG tablet TAKE ONE-HALF TO ONE TABLET BY MOUTH THREE TIMES DAILY AS NEEDED FOR ANXIETY 90 tablet 3  . aspirin EC 81 MG tablet Take 162 mg by mouth at bedtime.    Marland Kitchen atorvastatin (LIPITOR) 80 MG tablet Take 1 tablet (80 mg total) by mouth daily. 30 tablet 11  . cholecalciferol (VITAMIN D) 1000 UNITS tablet Take 1,000 Units by mouth daily.     Marland Kitchen CINNAMON PO Take 500 mg by mouth 2 (two) times daily.    Marland Kitchen estradiol (ESTRACE) 1 MG tablet TAKE 1 TABLET EVERY DAY 90 tablet 1  . Flaxseed, Linseed, (FLAXSEED OIL PO) Take 1,000 mg by mouth 2 (two) times daily.     . furosemide (LASIX) 80 MG tablet TAKE ONE TABLET BY MOUTH ONCE DAILY 90 tablet 1  . losartan-hydrochlorothiazide (HYZAAR) 50-12.5 MG tablet TAKE 1 TABLET BY MOUTH EVERY DAY 90 tablet 2  . LYRICA 150 MG capsule TAKE ONE CAPSULE BY MOUTH THREE TIMES DAILY 270 capsule 0  . medroxyPROGESTERone (PROVERA) 2.5 MG tablet TAKE 1 TABLET EVERY DAY 90 tablet 1  . montelukast (SINGULAIR) 10 MG tablet TAKE 1 TABLET BY MOUTH ONCE DAILY 90 tablet 99  . Multiple Vitamin (MULITIVITAMIN WITH MINERALS) TABS Take 1 tablet by mouth daily.    . Omega-3 Fatty Acids (OMEGA-3 FISH OIL) 1200  MG CAPS Take by mouth 2 (two) times daily.    Marland Kitchen pyridOXINE (VITAMIN B-6) 100 MG tablet Take 100 mg by mouth 2 (two) times daily.     No current facility-administered medications on file prior to visit.    Medical History:  Past Medical History  Diagnosis Date  . Hypertension   . Arthritis   . Anemia   . 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 Pelvis fracture [facility]) March 26, 2002    both sides fx  . Hyperlipidemia   . Pre-diabetes   . Vitamin D deficiency     Allergies:  Allergies  Allergen Reactions  . Oxycodone-Acetaminophen Other (See Comments)    panic attack  . Vit C-Cholecalciferol-Rose Hip  [Cholecalciferol-Vitamin C] Other (See Comments)    dyspepsia  . Ibuprofen [Ibuprofen] Rash  . Macrodantin [Nitrofurantoin Macrocrystal] Rash     Review of Systems:  Review of Systems  Constitutional: Negative for fever, chills and malaise/fatigue.  HENT: Negative for congestion, ear pain and sore throat.   Eyes: Negative.   Respiratory: Negative for cough, shortness of breath and wheezing.   Cardiovascular: Negative for chest pain, palpitations and leg swelling.  Gastrointestinal: Negative for heartburn, diarrhea, constipation, blood in stool and melena.  Genitourinary: Negative.   Skin: Negative.   Neurological: Negative for dizziness, sensory change, loss of consciousness and headaches.  Psychiatric/Behavioral: Negative for depression. The patient is not nervous/anxious and does not have insomnia.     Family history- Review and unchanged  Social history- Review and unchanged  Physical Exam: BP 106/64 mmHg  Pulse 72  Temp(Src) 98.2 F (36.8 C) (Temporal)  Resp 16  Ht 5\' 3"  (1.6 m)  Wt 172 lb (78.019 kg)  BMI 30.48 kg/m2 Wt Readings from Last 3 Encounters:  08/07/15 172 lb (78.019 kg)  04/25/15 188 lb 6.4 oz (85.458 kg)  11/22/14 193 lb (87.544 kg)    General Appearance: Well nourished well developed, in no apparent distress. Eyes: PERRLA, EOMs, conjunctiva no swelling or erythema ENT/Mouth: Ear canals normal without obstruction, swelling, erythma, discharge.  TMs normal bilaterally.  Oropharynx moist, clear, without exudate, or postoropharyngeal swelling. Neck: Supple, thyroid normal,no cervical adenopathy  Respiratory: Respiratory effort normal, Breath sounds clear A&P without rhonchi, wheeze, or rale.  No retractions, no accessory usage. Cardio: RRR with no MRGs. Brisk peripheral pulses without edema.  Abdomen: Soft, + BS,  Non tender, no guarding, rebound, hernias, masses. Musculoskeletal: Full ROM, 5/5 strength, Normal gait Skin: Warm, dry without rashes, lesions,  ecchymosis.  Neuro: Awake and oriented X 3, Cranial nerves intact. Normal muscle tone, no cerebellar symptoms. Psych: Normal affect, Insight and Judgment appropriate.    Starlyn Skeans, PA-C 9:45 AM [facility]

## 2015-08-08 LAB — HEMOGLOBIN A1C
Hgb A1c MFr Bld: 6 % — ABNORMAL HIGH (ref <5.7)
Mean Plasma Glucose: 126 mg/dL — ABNORMAL HIGH (ref <117)

## 2015-09-05 DIAGNOSIS — Z961 Presence of intraocular lens: Secondary | ICD-10-CM | POA: Diagnosis not present

## 2015-11-07 ENCOUNTER — Encounter: Payer: Self-pay | Admitting: Internal Medicine

## 2015-11-07 ENCOUNTER — Ambulatory Visit (INDEPENDENT_AMBULATORY_CARE_PROVIDER_SITE_OTHER): Payer: PPO | Admitting: Internal Medicine

## 2015-11-07 VITALS — BP 110/74 | HR 64 | Temp 97.1°F | Resp 16 | Ht 63.0 in | Wt 161.4 lb

## 2015-11-07 DIAGNOSIS — I10 Essential (primary) hypertension: Secondary | ICD-10-CM

## 2015-11-07 DIAGNOSIS — B351 Tinea unguium: Secondary | ICD-10-CM

## 2015-11-07 DIAGNOSIS — E782 Mixed hyperlipidemia: Secondary | ICD-10-CM

## 2015-11-07 DIAGNOSIS — R7309 Other abnormal glucose: Secondary | ICD-10-CM | POA: Diagnosis not present

## 2015-11-07 DIAGNOSIS — R7303 Prediabetes: Secondary | ICD-10-CM | POA: Diagnosis not present

## 2015-11-07 DIAGNOSIS — E559 Vitamin D deficiency, unspecified: Secondary | ICD-10-CM | POA: Diagnosis not present

## 2015-11-07 DIAGNOSIS — Z79899 Other long term (current) drug therapy: Secondary | ICD-10-CM

## 2015-11-07 LAB — CBC WITH DIFFERENTIAL/PLATELET
BASOS ABS: 0.1 10*3/uL (ref 0.0–0.1)
Basophils Relative: 1 % (ref 0–1)
EOS ABS: 0.1 10*3/uL (ref 0.0–0.7)
EOS PCT: 2 % (ref 0–5)
HCT: 41.5 % (ref 36.0–46.0)
Hemoglobin: 13.7 g/dL (ref 12.0–15.0)
LYMPHS ABS: 1.7 10*3/uL (ref 0.7–4.0)
Lymphocytes Relative: 29 % (ref 12–46)
MCH: 29.9 pg (ref 26.0–34.0)
MCHC: 33 g/dL (ref 30.0–36.0)
MCV: 90.6 fL (ref 78.0–100.0)
MPV: 12.1 fL (ref 8.6–12.4)
Monocytes Absolute: 0.5 10*3/uL (ref 0.1–1.0)
Monocytes Relative: 9 % (ref 3–12)
NEUTROS PCT: 59 % (ref 43–77)
Neutro Abs: 3.5 10*3/uL (ref 1.7–7.7)
PLATELETS: 283 10*3/uL (ref 150–400)
RBC: 4.58 MIL/uL (ref 3.87–5.11)
RDW: 14.4 % (ref 11.5–15.5)
WBC: 5.9 10*3/uL (ref 4.0–10.5)

## 2015-11-07 LAB — BASIC METABOLIC PANEL WITH GFR
BUN: 32 mg/dL — ABNORMAL HIGH (ref 7–25)
CALCIUM: 9.4 mg/dL (ref 8.6–10.4)
CHLORIDE: 96 mmol/L — AB (ref 98–110)
CO2: 30 mmol/L (ref 20–31)
Creat: 0.96 mg/dL — ABNORMAL HIGH (ref 0.60–0.93)
GFR, EST NON AFRICAN AMERICAN: 59 mL/min — AB (ref >=60)
GFR, Est African American: 68 mL/min (ref >=60)
Glucose, Bld: 87 mg/dL (ref 65–99)
POTASSIUM: 4.1 mmol/L (ref 3.5–5.3)
SODIUM: 138 mmol/L (ref 135–146)

## 2015-11-07 LAB — LIPID PANEL
CHOLESTEROL: 167 mg/dL (ref 125–200)
HDL: 61 mg/dL (ref >=46)
LDL Cholesterol: 89 mg/dL (ref <130)
Total CHOL/HDL Ratio: 2.7 Ratio (ref <=5.0)
Triglycerides: 84 mg/dL (ref <150)
VLDL: 17 mg/dL (ref <30)

## 2015-11-07 LAB — HEPATIC FUNCTION PANEL
ALT: 26 U/L (ref 6–29)
AST: 38 U/L — ABNORMAL HIGH (ref 10–35)
Albumin: 4.2 g/dL (ref 3.6–5.1)
Alkaline Phosphatase: 104 U/L (ref 33–130)
Bilirubin, Direct: 0.1 mg/dL (ref <=0.2)
Indirect Bilirubin: 0.6 mg/dL (ref 0.2–1.2)
TOTAL PROTEIN: 7.2 g/dL (ref 6.1–8.1)
Total Bilirubin: 0.7 mg/dL (ref 0.2–1.2)

## 2015-11-07 LAB — MAGNESIUM: MAGNESIUM: 2 mg/dL (ref 1.5–2.5)

## 2015-11-07 LAB — TSH: TSH: 1.4 m[IU]/L

## 2015-11-07 MED ORDER — TERBINAFINE HCL 250 MG PO TABS: 250.0000 mg | ORAL_TABLET | Freq: Every day | ORAL | Status: DC

## 2015-11-07 NOTE — Progress Notes (Signed)
Patient ID: Gwendolyn [name], female   DOB: 05/21/1941, 75 y.o.   MRN: [id number]   This very nice 75 y.o. [name] presents for 3 month follow up with Hypertension, Hyperlipidemia, Pre-Diabetes and Vitamin D Deficiency.    Patient is treated for HTN circa 2008 & BP has been controlled at home. Today's BP: 110/74 mmHg. Patient has had no complaints of any cardiac type chest pain, palpitations, dyspnea/orthopnea/PND, dizziness, claudication, or dependent edema.   Hyperlipidemia is controlled with diet & meds. Patient denies myalgias or other med SE's. Last Lipids were at goal with Cholesterol 165; HDL 56; LDL 91; Triglycerides 89 on 08/07/2015.   Also, the patient has history of PreDiabetes since 2011 with A1c 6.0%  and has had no symptoms of reactive hypoglycemia, diabetic polys, paresthesias or visual blurring.  Last A1c was 6.0% on 10/07/2014. Patient has lost 27#over the last 6 months from 188# in Aug 2016 to her current 161# , but she relates her weight actually got up to 195# , before she started her Weight Watcher's diet.    Further, the patient also has history of Vitamin D Deficiency and supplements vitamin D without any suspected side-effects. Last vitamin D was 79 on 04/25/2015.    Medication Sig  . ALPRAZolam  0.5 MG tablet TAKE ONE-HALF TO ONE TABLET BY MOUTH THREE TIMES DAILY AS NEEDED FOR ANXIETY  . aspirin EC 81 MG tablet Take 162 mg by mouth at bedtime.  Marland Kitchen CINNAMON PO Take 500 mg by mouth 2 (two) times daily.  Marland Kitchen estradiol  1 MG tablet TAKE 1 TABLET EVERY DAY  . FLAXSEED OIL  Take 1,000 mg by mouth 2 (two) times daily.   . furosemide  80 MG tablet TAKE ONE TABLET BY MOUTH ONCE DAILY  . losartan-hctz 50-12.5 MG  TAKE 1 TABLET BY MOUTH EVERY DAY  . LYRICA 150 MG capsule TAKE ONE CAPSULE BY MOUTH THREE TIMES DAILY  . PROVERA 2.5 MG tablet TAKE 1 TABLET EVERY DAY  . montelukast  10 MG tablet TAKE 1 TABLET BY MOUTH ONCE DAILY  . MULITIVITAMIN WITH MINERALS Take 1 tablet by mouth daily.  .  Omega-3 FISH OIL 1200 MG  Take by mouth 2 (two) times daily.  . potassium chloride SA 20 MEQ  Take 1 tablet (20 mEq total) by mouth daily.  Marland Kitchen VITAMIN B-6 100 MG tablet Take 100 mg by mouth 2 (two) times daily.  Marland Kitchen atorvastatin  80 MG tablet Take 1 tablet (80 mg total) by mouth daily.  Marland Kitchen VITAMIN D1000 UNITS tablet Take 1,000 Units by mouth daily.    Allergies  Allergen Reactions  . Oxycodone-Acetaminophen Other (See Comments)    panic attack  . Vit C-Cholecalciferol-Rose Hip [Cholecalciferol-Vitamin C] Other (See Comments)    dyspepsia  . Ibuprofen [Ibuprofen] Rash  . Macrodantin [Nitrofurantoin Macrocrystal] Rash   PMHx:   Past Medical History  Diagnosis Date  . Hypertension   . Arthritis   . Anemia   . 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 Pelvis fracture [facility]) March 26, 2002    both sides fx  . Hyperlipidemia   . Pre-diabetes   . Vitamin D deficiency    Immunization History  Administered Date(s) Administered  . Influenza, High Dose Seasonal PF 06/06/2014  . Influenza-Unspecified 06/13/2013, 05/02/2015  . Pneumococcal Conjugate-13 05/24/2014  . Pneumococcal Polysaccharide-23 01/08/2009  . Tdap 01/28/2012  . Zoster 08/31/2006   Past Surgical History  Procedure Laterality Date  .  Right shoulder humerus fx  july 2003    surgery done  . Right hand ulner nerve surgery  March 26, 2002    I and d done right hand/wrist  . Right elbow   surgery  March 26, 2002  . Right wrist plating  2004  . Right total hip arthroplasty  2008  . 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 Tonsillectomy  age 81  . Total hip arthroplasty  10/22/2011    Procedure: TOTAL HIP ARTHROPLASTY ANTERIOR APPROACH;  Surgeon: Mauri Pole, MD;  Location: [facility];  Service: Orthopedics;  Laterality: Left;  Left Total Hip Arthroplasty,  Anterior Approach    FHx:    Reviewed / unchanged  SHx:    Reviewed / unchanged  Systems Review:  Constitutional: Denies fever, chills, wt changes, headaches,  insomnia, fatigue, night sweats, change in appetite. Eyes: Denies redness, blurred vision, diplopia, discharge, itchy, watery eyes.  ENT: Denies discharge, congestion, post nasal drip, epistaxis, sore throat, earache, hearing loss, dental pain, tinnitus, vertigo, sinus pain, snoring.  CV: Denies chest pain, palpitations, irregular heartbeat, syncope, dyspnea, diaphoresis, orthopnea, PND, claudication or edema. Respiratory: denies cough, dyspnea, DOE, pleurisy, hoarseness, laryngitis, wheezing.  Gastrointestinal: Denies dysphagia, odynophagia, heartburn, reflux, water brash, abdominal pain or cramps, nausea, vomiting, bloating, diarrhea, constipation, hematemesis, melena, hematochezia  or hemorrhoids. Genitourinary: Denies dysuria, frequency, urgency, nocturia, hesitancy, discharge, hematuria or flank pain. Musculoskeletal: Denies arthralgias, myalgias, stiffness, jt. swelling, pain, limping or strain/sprain.  Skin: Denies pruritus, rash, hives, warts, acne, eczema or change in skin lesion(s). Neuro: No weakness, tremor, incoordination, spasms, paresthesia or pain. Psychiatric: Denies confusion, memory loss or sensory loss. Endo: Denies change in weight, skin or hair change.  Heme/Lymph: No excessive bleeding, bruising or enlarged lymph nodes.  Physical Exam  BP 110/74 mmHg  Pulse 64  Temp(Src) 97.1 F (36.2 C)  Resp 16  Ht 5\' 3"  (1.6 m)  Wt 161 lb 6.4 oz (73.211 kg)  BMI 28.60 kg/m2  Appears well nourished and in no distress. Eyes: PERRLA, EOMs, conjunctiva no swelling or erythema. Sinuses: No frontal/maxillary tenderness ENT/Mouth: EAC's clear, TM's nl w/o erythema, bulging. Nares clear w/o erythema, swelling, exudates. Oropharynx clear without erythema or exudates. Oral hygiene is good. Tongue normal, non obstructing. Hearing intact.  Neck: Supple. Thyroid nl. Car 2+/2+ without bruits, nodes or JVD. Chest: Respirations nl with BS clear & equal w/o rales, rhonchi, wheezing or stridor.   Cor: Heart sounds normal w/ regular rate and rhythm without sig. murmurs, gallops, clicks, or rubs. Peripheral pulses normal and equal  without edema.  Abdomen: Soft & bowel sounds normal. Non-tender w/o guarding, rebound, hernias, masses, or organomegaly.  Lymphatics: Unremarkable.  Musculoskeletal: Full ROM all peripheral extremities, joint stability, 5/5 strength, and normal gait.  Skin: Warm, dry without exposed rashes, lesions or ecchymosis apparent.  Neuro: Cranial nerves intact, reflexes equal bilaterally. Sensory-motor testing grossly intact. Tendon reflexes grossly intact.  Pysch: Alert & oriented x 3.  Insight and judgement nl & appropriate. No ideations.  Assessment and Plan:  1. Essential hypertension  - TSH  2. Hyperlipidemia  - Lipid panel - TSH  3. Prediabetes  - Hemoglobin A1c - Insulin, random  4. Morbid obesity ([place])  - VITAMIN D 25 Hydroxy   5. Medication management  - CBC with Differential/Platelet - BASIC METABOLIC PANEL WITH GFR - Hepatic function panel - Magnesium   Recommended regular exercise, BP monitoring, weight control, and discussed med and SE's. Recommended labs to assess and  monitor clinical status. Further disposition pending results of labs. Over 30 minutes of exam, counseling, chart review was performed

## 2015-11-07 NOTE — Patient Instructions (Signed)

## 2015-11-08 LAB — VITAMIN D 25 HYDROXY (VIT D DEFICIENCY, FRACTURES): VIT D 25 HYDROXY: 88 ng/mL (ref 30–100)

## 2015-11-08 LAB — INSULIN, RANDOM: Insulin: 2.7 u[IU]/mL (ref 2.0–19.6)

## 2015-11-08 LAB — HEMOGLOBIN A1C
HEMOGLOBIN A1C: 5.7 % — AB (ref <5.7)
MEAN PLASMA GLUCOSE: 117 mg/dL — AB (ref <117)

## 2015-11-14 ENCOUNTER — Other Ambulatory Visit: Payer: Self-pay | Admitting: Internal Medicine

## 2015-12-11 DIAGNOSIS — R202 Paresthesia of skin: Secondary | ICD-10-CM | POA: Diagnosis not present

## 2015-12-11 DIAGNOSIS — L821 Other seborrheic keratosis: Secondary | ICD-10-CM | POA: Diagnosis not present

## 2015-12-11 DIAGNOSIS — D485 Neoplasm of uncertain behavior of skin: Secondary | ICD-10-CM | POA: Diagnosis not present

## 2015-12-11 DIAGNOSIS — D225 Melanocytic nevi of trunk: Secondary | ICD-10-CM | POA: Diagnosis not present

## 2015-12-19 ENCOUNTER — Other Ambulatory Visit: Payer: PPO

## 2015-12-19 ENCOUNTER — Other Ambulatory Visit: Payer: Self-pay | Admitting: Internal Medicine

## 2015-12-19 DIAGNOSIS — B351 Tinea unguium: Secondary | ICD-10-CM

## 2015-12-19 DIAGNOSIS — Z79899 Other long term (current) drug therapy: Secondary | ICD-10-CM

## 2015-12-19 LAB — HEPATIC FUNCTION PANEL
ALBUMIN: 3.9 g/dL (ref 3.6–5.1)
ALK PHOS: 87 U/L (ref 33–130)
ALT: 24 U/L (ref 6–29)
AST: 28 U/L (ref 10–35)
Bilirubin, Direct: 0.1 mg/dL (ref <=0.2)
Indirect Bilirubin: 0.4 mg/dL (ref 0.2–1.2)
TOTAL PROTEIN: 6.5 g/dL (ref 6.1–8.1)
Total Bilirubin: 0.5 mg/dL (ref 0.2–1.2)

## 2016-01-29 ENCOUNTER — Other Ambulatory Visit: Payer: Self-pay | Admitting: Internal Medicine

## 2016-01-29 DIAGNOSIS — IMO0002 Reserved for concepts with insufficient information to code with codable children: Secondary | ICD-10-CM

## 2016-02-11 ENCOUNTER — Encounter: Payer: Self-pay | Admitting: Physician Assistant

## 2016-02-11 ENCOUNTER — Ambulatory Visit (INDEPENDENT_AMBULATORY_CARE_PROVIDER_SITE_OTHER): Payer: PPO | Admitting: Physician Assistant

## 2016-02-11 VITALS — BP 108/60 | HR 66 | Temp 97.3°F | Resp 16 | Ht 63.0 in | Wt 152.6 lb

## 2016-02-11 DIAGNOSIS — Z0001 Encounter for general adult medical examination with abnormal findings: Secondary | ICD-10-CM | POA: Diagnosis not present

## 2016-02-11 DIAGNOSIS — Z0000 Encounter for general adult medical examination without abnormal findings: Secondary | ICD-10-CM

## 2016-02-11 DIAGNOSIS — R6889 Other general symptoms and signs: Secondary | ICD-10-CM

## 2016-02-11 DIAGNOSIS — I10 Essential (primary) hypertension: Secondary | ICD-10-CM | POA: Diagnosis not present

## 2016-02-11 DIAGNOSIS — E2839 Other primary ovarian failure: Secondary | ICD-10-CM

## 2016-02-11 DIAGNOSIS — M5412 Radiculopathy, cervical region: Secondary | ICD-10-CM | POA: Diagnosis not present

## 2016-02-11 DIAGNOSIS — R7309 Other abnormal glucose: Secondary | ICD-10-CM | POA: Diagnosis not present

## 2016-02-11 DIAGNOSIS — Z79899 Other long term (current) drug therapy: Secondary | ICD-10-CM

## 2016-02-11 DIAGNOSIS — F411 Generalized anxiety disorder: Secondary | ICD-10-CM

## 2016-02-11 DIAGNOSIS — E559 Vitamin D deficiency, unspecified: Secondary | ICD-10-CM | POA: Diagnosis not present

## 2016-02-11 DIAGNOSIS — E782 Mixed hyperlipidemia: Secondary | ICD-10-CM

## 2016-02-11 HISTORY — DX: Radiculopathy, cervical region: M54.12

## 2016-02-11 LAB — CBC WITH DIFFERENTIAL/PLATELET
BASOS PCT: 1 %
Basophils Absolute: 55 cells/uL (ref 0–200)
Eosinophils Absolute: 165 cells/uL (ref 15–500)
Eosinophils Relative: 3 %
HCT: 38.4 % (ref 35.0–45.0)
HEMOGLOBIN: 12.8 g/dL (ref 11.7–15.5)
LYMPHS ABS: 1650 {cells}/uL (ref 850–3900)
Lymphocytes Relative: 30 %
MCH: 30.2 pg (ref 27.0–33.0)
MCHC: 33.3 g/dL (ref 32.0–36.0)
MCV: 90.6 fL (ref 80.0–100.0)
MONO ABS: 495 {cells}/uL (ref 200–950)
MONOS PCT: 9 %
MPV: 11.3 fL (ref 7.5–12.5)
NEUTROS ABS: 3135 {cells}/uL (ref 1500–7800)
Neutrophils Relative %: 57 %
PLATELETS: 294 10*3/uL (ref 140–400)
RBC: 4.24 MIL/uL (ref 3.80–5.10)
RDW: 14.5 % (ref 11.0–15.0)
WBC: 5.5 10*3/uL (ref 3.8–10.8)

## 2016-02-11 LAB — BASIC METABOLIC PANEL WITH GFR
BUN: 22 mg/dL (ref 7–25)
CHLORIDE: 100 mmol/L (ref 98–110)
CO2: 31 mmol/L (ref 20–31)
Calcium: 9 mg/dL (ref 8.6–10.4)
Creat: 0.89 mg/dL (ref 0.60–0.93)
GFR, Est African American: 74 mL/min (ref >=60)
GFR, Est Non African American: 64 mL/min (ref >=60)
Glucose, Bld: 82 mg/dL (ref 65–99)
Potassium: 4.3 mmol/L (ref 3.5–5.3)
SODIUM: 139 mmol/L (ref 135–146)

## 2016-02-11 LAB — HEPATIC FUNCTION PANEL
ALT: 26 U/L (ref 6–29)
AST: 32 U/L (ref 10–35)
Albumin: 3.9 g/dL (ref 3.6–5.1)
Alkaline Phosphatase: 97 U/L (ref 33–130)
BILIRUBIN DIRECT: 0.1 mg/dL (ref <=0.2)
BILIRUBIN INDIRECT: 0.5 mg/dL (ref 0.2–1.2)
BILIRUBIN TOTAL: 0.6 mg/dL (ref 0.2–1.2)
Total Protein: 6.5 g/dL (ref 6.1–8.1)

## 2016-02-11 LAB — LIPID PANEL
CHOL/HDL RATIO: 2.6 ratio (ref <=5.0)
Cholesterol: 155 mg/dL (ref 125–200)
HDL: 60 mg/dL (ref >=46)
LDL CALC: 80 mg/dL (ref <130)
Triglycerides: 76 mg/dL (ref <150)
VLDL: 15 mg/dL (ref <30)

## 2016-02-11 LAB — MAGNESIUM: Magnesium: 2.2 mg/dL (ref 1.5–2.5)

## 2016-02-11 LAB — HEMOGLOBIN A1C
HEMOGLOBIN A1C: 5.5 % (ref <5.7)
Mean Plasma Glucose: 111 mg/dL

## 2016-02-11 LAB — TSH: TSH: 1.46 mIU/L

## 2016-02-11 NOTE — Patient Instructions (Signed)

## 2016-02-11 NOTE — Progress Notes (Signed)
MEDICARE ANNUAL WELLNESS VISIT AND FOLLOW UP  Assessment:   1. Essential hypertension - CBC with Differential/Platelet - BASIC METABOLIC PANEL WITH GFR - Hepatic function panel - TSH  2. Hyperlipidemia - Lipid panel  3. Abnormal glucose - Hemoglobin A1c  4. Morbid obesity, unspecified obesity type ([place]) Doing great job with weight loss, keep up great work  5. Vitamin D deficiency - VITAMIN D 25 Hydroxy (Vit-D Deficiency, Fractures)  6. Medication management - Magnesium  7. [facility] [phone]   8. Generalized anxiety disorder Continue xanax PRN, very rare and small dose  9. Cervical radicular pain Continue lyrica, patient will call if she wants PT  10. Estrogen deficiency - DG Bone Density; Future  Over 30 minutes of exam, counseling, chart review, and critical decision making was performed Future Appointments Date Time Provider [place]  05/14/2016 9:00 AM Unk Pinto, MD [facility] None     Plan:   During the course of the visit the patient was educated and counseled about appropriate screening and preventive services including:    Pneumococcal vaccine   Influenza vaccine  Td vaccine  Prevnar 13  Screening electrocardiogram  Screening mammography  Bone densitometry screening  Colorectal cancer screening  Diabetes screening  Glaucoma screening  Nutrition counseling   Advanced directives: given info/requested copies  Conditions/risks identified: Diabetes is at goal, ACE/ARB therapy: only PreDM Urinary Incontinence is not an issue: discussed non pharmacology and pharmacology options.  Fall risk: low- discussed PT, home fall assessment, medications.    Subjective:   Gwendolyn [name] is a 75 y.o. female who presents for Medicare Annual Wellness Visit and 3 month follow up on hypertension, prediabetes, hyperlipidemia, vitamin D def.  Date of last medicare wellness visit was 07/2014  Her blood pressure has been  controlled at home, today their BP is BP: 108/60 mmHg  She does workout, goes to [employer] 3 days a week. She denies chest pain, shortness of breath, dizziness. Has no pain, but has right shoulder/arm numbness/tingling, has some between her shoulder itching, she is on lyrica 150 at night.   She is on cholesterol medication and denies myalgias. Her cholesterol is at goal. The cholesterol last visit was:   Lab Results  Component Value Date   CHOL 167 11/07/2015   HDL 61 11/07/2015   LDLCALC 89 11/07/2015   TRIG 84 11/07/2015   CHOLHDL 2.7 11/07/2015    She has been working on diet and exercise for prediabetes, and denies paresthesia of the feet, polydipsia, polyuria and visual disturbances. Last A1C in the office was:  Lab Results  Component Value Date   HGBA1C 5.7* 11/07/2015   Patient is on Vitamin D supplement.   Lab Results  Component Value Date   VD25OH 88 11/07/2015     BMI is Body mass index is 27.04 kg/(m^2)., she is working on diet and exercise, she is doing [employer]. Wt Readings from Last 3 Encounters:  02/11/16 152 lb 9.6 oz (69.219 kg)  11/07/15 161 lb 6.4 oz (73.211 kg)  08/07/15 172 lb (78.019 kg)    Medication Review Current Outpatient Prescriptions on File Prior to Visit  Medication Sig Dispense Refill  . ALPRAZolam (XANAX) 0.5 MG tablet TAKE ONE-HALF TO ONE TABLET BY MOUTH THREE TIMES DAILY AS NEEDED FOR ANXIETY 90 tablet 3  . aspirin EC 81 MG tablet Take 162 mg by mouth at bedtime.    . Cholecalciferol (VITAMIN D) 2000 units CAPS Take 1 capsule by mouth daily.    Marland Kitchen  estradiol (ESTRACE) 1 MG tablet TAKE 1 TABLET EVERY DAY 90 tablet 1  . Flaxseed, Linseed, (FLAXSEED OIL PO) Take 1,000 mg by mouth 2 (two) times daily.     . furosemide (LASIX) 80 MG tablet TAKE ONE TABLET BY MOUTH ONCE DAILY 90 tablet 1  . losartan-hydrochlorothiazide (HYZAAR) 50-12.5 MG tablet TAKE 1 TABLET BY MOUTH EVERY DAY 90 tablet 2  . LYRICA 150 MG capsule TAKE ONE CAPSULE BY MOUTH  THREE TIMES DAILY 270 capsule 1  . medroxyPROGESTERone (PROVERA) 2.5 MG tablet TAKE 1 TABLET EVERY DAY 90 tablet 1  . montelukast (SINGULAIR) 10 MG tablet TAKE 1 TABLET BY MOUTH ONCE DAILY 90 tablet 1  . Multiple Vitamin (MULITIVITAMIN WITH MINERALS) TABS Take 1 tablet by mouth daily.    . potassium chloride SA (K-DUR,KLOR-CON) 20 MEQ tablet Take 1 tablet (20 mEq total) by mouth daily. 90 tablet 2  . pyridOXINE (VITAMIN B-6) 100 MG tablet Take 100 mg by mouth 2 (two) times daily.    Marland Kitchen terbinafine (LAMISIL) 250 MG tablet Take 1 tablet (250 mg total) by mouth daily. 90 tablet 1  . atorvastatin (LIPITOR) 80 MG tablet Take 1 tablet (80 mg total) by mouth daily. 30 tablet 11   No current facility-administered medications on file prior to visit.    Current Problems (verified) Patient Active Problem List   Diagnosis Date Noted  . Generalized anxiety disorder 02/11/2016  . Cervical radicular pain 02/11/2016  . [facility] [id number] on 08/21/2014 due Dec 2016 04/25/2015  . Morbid obesity (BMI 33.38) 04/25/2015  . Medication management 02/12/2014  . Essential hypertension 08/08/2013  . Hyperlipidemia 08/08/2013  . Abnormal glucose 08/08/2013  . Vitamin D deficiency 08/08/2013    Screening Tests Immunization History  Administered Date(s) Administered  . Influenza, High Dose Seasonal PF 06/06/2014  . Influenza-Unspecified 06/13/2013, 05/02/2015  . Pneumococcal Conjugate-13 05/24/2014  . Pneumococcal Polysaccharide-23 01/08/2009  . Tdap 01/28/2012  . Zoster 08/31/2006    Preventative care: Last colonoscopy: 2008 DUE NEXT YEAR Last mammogram: 06/2015 Last pap smear/pelvic exam: 2016, declines another  DEXA:07/2009 CXR 12/2012 MRI LUMBAR 2009  Prior vaccinations: TD or Tdap: 2013  Influenza: 2016 Pneumococcal: 2010 Prevnar13: 2015 Shingles/Zostavax: 2008  Names of Other Physician/Practitioners you currently use: 1. [facility]- here  for primary care 2. [place], eye doctor, last visit jan 2017 for cataract surgery 3. Dr. Randol Kern, dentist, last visit May 2017 Patient Care Team: Unk Pinto, MD as PCP - General (Internal Medicine) [place] Dragon, MD as Consulting Physician (Gastroenterology) Newton Pigg, MD as Consulting Physician (Obstetrics and Gynecology)  Past Surgical History  Procedure Laterality Date  . Right shoulder humerus fx  july 2003    surgery done  . Right hand ulner nerve surgery  March 26, 2002    I and d done right hand/wrist  . Right elbow   surgery  March 26, 2002  . Right wrist plating  2004  . Right total hip arthroplasty  2008  . 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 Tonsillectomy  age 13  . Total hip arthroplasty  10/22/2011    Procedure: TOTAL HIP ARTHROPLASTY ANTERIOR APPROACH;  Surgeon: Mauri Pole, MD;  Location: [facility];  Service: Orthopedics;  Laterality: Left;  Left Total Hip Arthroplasty,  Anterior Approach    Family History  Problem Relation Age of Onset  . Hypertension Mother   . Diabetes Mother   . Heart disease Mother   . Diabetes  Father   . Heart disease Father    Social History  Substance Use Topics  . Smoking status: Never Smoker   . Smokeless tobacco: Never Used  . Alcohol Use: Yes     Comment: social   Allergies  Allergen Reactions  . Oxycodone-Acetaminophen Other (See Comments)    panic attack  . Vit C-Cholecalciferol-Rose Hip [Cholecalciferol-Vitamin C] Other (See Comments)    dyspepsia  . Ibuprofen [Ibuprofen] Rash  . Macrodantin [Nitrofurantoin Macrocrystal] Rash    MEDICARE WELLNESS OBJECTIVES: Tobacco use: She does not smoke.  Patient is not a former smoker. If yes, counseling given Alcohol Current alcohol use: social drinker Diet: in general, a "healthy" diet   Physical activity: Current Exercise Habits: Structured exercise class, Type of exercise: Other - see comments (chair exercises), Time (Minutes): 20, Frequency (Times/Week): 3,  Weekly Exercise (Minutes/Week): 60, Intensity: Moderate Cardiac risk factors: Cardiac Risk Factors include: advanced age (>92men, >87 women);dyslipidemia;hypertension Depression/mood screen:   Depression screen [facility] 2/9 02/11/2016  Decreased Interest 0  Down, Depressed, Hopeless 0  PHQ - 2 Score 0    ADLs:  In your present state of health, do you have any difficulty performing the following activities: 02/11/2016 11/07/2015  Hearing? N N  Vision? N N  Difficulty concentrating or making decisions? N N  Walking or climbing stairs? N N  Dressing or bathing? N N  Doing errands, shopping? N N  Preparing Food and eating ? N -  Using the Toilet? N -  In the past six months, have you accidently leaked urine? N -  Do you have problems with loss of bowel control? N -  Managing your Medications? N -  Managing your Finances? N -  Housekeeping or managing your Housekeeping? N -     Cognitive Testing  Alert? Yes  Normal Appearance?Yes  Oriented to person? Yes  Place? Yes   Time? Yes  Recall of three objects?  Yes  Can perform simple calculations? Yes  Displays appropriate judgment?Yes  Can read the correct time from a watch face?Yes  EOL planning: Does patient have an advance directive?: Yes Type of Advance Directive: Healthcare Power of Attorney, Living will Does patient want to make changes to advanced directive?: No - Patient declined Copy of advanced directive(s) in chart?: No - copy requested   Objective:   Today's Vitals   02/11/16 1006  BP: 108/60  Pulse: 66  Temp: 97.3 F (36.3 C)  TempSrc: Temporal  Resp: 16  Height: 5\' 3"  (1.6 m)  Weight: 152 lb 9.6 oz (69.219 kg)  SpO2: 97%  PainSc: 0-No pain   Body mass index is 27.04 kg/(m^2).  General appearance: alert, no distress, WD/WN,  female HEENT: normocephalic, sclerae anicteric, TMs pearly, nares patent, no discharge or erythema, pharynx normal Oral cavity: MMM, no lesions Neck: supple, no lymphadenopathy, no  thyromegaly, no masses Heart: RRR, normal S1, S2, no murmurs Lungs: CTA bilaterally, no wheezes, rhonchi, or rales Abdomen: +bs, soft, non tender, non distended, no masses, no hepatomegaly, no splenomegaly Musculoskeletal: nontender, no swelling, no obvious deformity Extremities: no edema, no cyanosis, no clubbing Pulses: 2+ symmetric, upper and lower extremities, normal cap refill Neurological: alert, oriented x 3, CN2-12 intact, strength normal upper extremities and lower extremities, sensation normal throughout, DTRs 2+ throughout, no cerebellar signs, gait normal Psychiatric: normal affect, behavior normal, pleasant  Breast: defer Gyn: defer Rectal: defer   Medicare Attestation I have personally reviewed: The patient's medical and social history Their use of alcohol, tobacco or  illicit drugs Their current medications and supplements The patient's functional ability including ADLs,fall risks, home safety risks, cognitive, and hearing and visual impairment Diet and physical activities Evidence for depression or mood disorders  The patient's weight, height, BMI, and visual acuity have been recorded in the chart.  I have made referrals, counseling, and provided education to the patient based on review of the above and I have provided the patient with a written personalized care plan for preventive services.     Vicie Mutters, PA-C   02/11/2016

## 2016-02-12 LAB — VITAMIN D 25 HYDROXY (VIT D DEFICIENCY, FRACTURES): Vit D, 25-Hydroxy: 96 ng/mL (ref 30–100)

## 2016-02-13 DIAGNOSIS — H40012 Open angle with borderline findings, low risk, left eye: Secondary | ICD-10-CM | POA: Diagnosis not present

## 2016-02-13 DIAGNOSIS — Z0100 Encounter for examination of eyes and vision without abnormal findings: Secondary | ICD-10-CM | POA: Diagnosis not present

## 2016-02-13 DIAGNOSIS — Z961 Presence of intraocular lens: Secondary | ICD-10-CM | POA: Diagnosis not present

## 2016-02-13 DIAGNOSIS — H40011 Open angle with borderline findings, low risk, right eye: Secondary | ICD-10-CM | POA: Diagnosis not present

## 2016-03-10 ENCOUNTER — Ambulatory Visit
Admission: RE | Admit: 2016-03-10 | Discharge: 2016-03-10 | Disposition: A | Discharge status: Home / Self Care | Payer: PPO | Source: Ambulatory Visit | Attending: Physician Assistant | Admitting: Physician Assistant

## 2016-03-10 DIAGNOSIS — Z780 Asymptomatic menopausal state: Secondary | ICD-10-CM | POA: Diagnosis not present

## 2016-03-10 DIAGNOSIS — E2839 Other primary ovarian failure: Secondary | ICD-10-CM

## 2016-03-10 DIAGNOSIS — Z13820 Encounter for screening for osteoporosis: Secondary | ICD-10-CM | POA: Diagnosis not present

## 2016-03-17 ENCOUNTER — Other Ambulatory Visit: Payer: Self-pay | Admitting: Internal Medicine

## 2016-04-30 DIAGNOSIS — H401212 Low-tension glaucoma, right eye, moderate stage: Secondary | ICD-10-CM | POA: Diagnosis not present

## 2016-04-30 DIAGNOSIS — H16103 Unspecified superficial keratitis, bilateral: Secondary | ICD-10-CM | POA: Diagnosis not present

## 2016-04-30 DIAGNOSIS — H04123 Dry eye syndrome of bilateral lacrimal glands: Secondary | ICD-10-CM | POA: Diagnosis not present

## 2016-04-30 DIAGNOSIS — H40012 Open angle with borderline findings, low risk, left eye: Secondary | ICD-10-CM | POA: Diagnosis not present

## 2016-05-14 ENCOUNTER — Ambulatory Visit (INDEPENDENT_AMBULATORY_CARE_PROVIDER_SITE_OTHER): Payer: PPO | Admitting: Internal Medicine

## 2016-05-14 ENCOUNTER — Encounter: Payer: Self-pay | Admitting: Internal Medicine

## 2016-05-14 VITALS — BP 126/72 | HR 72 | Temp 97.4°F | Resp 16 | Ht 63.0 in | Wt 146.0 lb

## 2016-05-14 DIAGNOSIS — Z79899 Other long term (current) drug therapy: Secondary | ICD-10-CM | POA: Diagnosis not present

## 2016-05-14 DIAGNOSIS — Z23 Encounter for immunization: Secondary | ICD-10-CM

## 2016-05-14 DIAGNOSIS — E559 Vitamin D deficiency, unspecified: Secondary | ICD-10-CM

## 2016-05-14 DIAGNOSIS — R7303 Prediabetes: Secondary | ICD-10-CM

## 2016-05-14 DIAGNOSIS — H4010X0 Unspecified open-angle glaucoma, stage unspecified: Secondary | ICD-10-CM | POA: Diagnosis not present

## 2016-05-14 DIAGNOSIS — E782 Mixed hyperlipidemia: Secondary | ICD-10-CM | POA: Diagnosis not present

## 2016-05-14 DIAGNOSIS — I10 Essential (primary) hypertension: Secondary | ICD-10-CM | POA: Diagnosis not present

## 2016-05-14 DIAGNOSIS — R7309 Other abnormal glucose: Secondary | ICD-10-CM

## 2016-05-14 DIAGNOSIS — Z136 Encounter for screening for cardiovascular disorders: Secondary | ICD-10-CM | POA: Diagnosis not present

## 2016-05-14 DIAGNOSIS — Z1211 Encounter for screening for malignant neoplasm of colon: Secondary | ICD-10-CM

## 2016-05-14 LAB — MAGNESIUM: Magnesium: 2.1 mg/dL (ref 1.5–2.5)

## 2016-05-14 LAB — CBC WITH DIFFERENTIAL/PLATELET
BASOS ABS: 56 {cells}/uL (ref 0–200)
BASOS PCT: 1 %
EOS ABS: 168 {cells}/uL (ref 15–500)
Eosinophils Relative: 3 %
HEMATOCRIT: 39.7 % (ref 35.0–45.0)
Hemoglobin: 13.3 g/dL (ref 11.7–15.5)
LYMPHS PCT: 30 %
Lymphs Abs: 1680 cells/uL (ref 850–3900)
MCH: 30.2 pg (ref 27.0–33.0)
MCHC: 33.5 g/dL (ref 32.0–36.0)
MCV: 90 fL (ref 80.0–100.0)
MONO ABS: 560 {cells}/uL (ref 200–950)
MONOS PCT: 10 %
MPV: 11.5 fL (ref 7.5–12.5)
Neutro Abs: 3136 cells/uL (ref 1500–7800)
Neutrophils Relative %: 56 %
PLATELETS: 314 10*3/uL (ref 140–400)
RBC: 4.41 MIL/uL (ref 3.80–5.10)
RDW: 14.4 % (ref 11.0–15.0)
WBC: 5.6 10*3/uL (ref 3.8–10.8)

## 2016-05-14 LAB — HEPATIC FUNCTION PANEL
ALBUMIN: 4.1 g/dL (ref 3.6–5.1)
ALT: 21 U/L (ref 6–29)
AST: 26 U/L (ref 10–35)
Alkaline Phosphatase: 81 U/L (ref 33–130)
BILIRUBIN INDIRECT: 0.4 mg/dL (ref 0.2–1.2)
Bilirubin, Direct: 0.1 mg/dL (ref <=0.2)
TOTAL PROTEIN: 6.8 g/dL (ref 6.1–8.1)
Total Bilirubin: 0.5 mg/dL (ref 0.2–1.2)

## 2016-05-14 LAB — BASIC METABOLIC PANEL WITH GFR
BUN: 26 mg/dL — AB (ref 7–25)
CALCIUM: 9.1 mg/dL (ref 8.6–10.4)
CO2: 31 mmol/L (ref 20–31)
CREATININE: 0.96 mg/dL — AB (ref 0.60–0.93)
Chloride: 100 mmol/L (ref 98–110)
GFR, EST AFRICAN AMERICAN: 67 mL/min (ref >=60)
GFR, EST NON AFRICAN AMERICAN: 58 mL/min — AB (ref >=60)
GLUCOSE: 88 mg/dL (ref 65–99)
Potassium: 4 mmol/L (ref 3.5–5.3)
Sodium: 140 mmol/L (ref 135–146)

## 2016-05-14 LAB — LIPID PANEL
CHOLESTEROL: 163 mg/dL (ref 125–200)
HDL: 69 mg/dL (ref >=46)
LDL Cholesterol: 74 mg/dL (ref <130)
Total CHOL/HDL Ratio: 2.4 Ratio (ref <=5.0)
Triglycerides: 101 mg/dL (ref <150)
VLDL: 20 mg/dL (ref <30)

## 2016-05-14 LAB — HEMOGLOBIN A1C
HEMOGLOBIN A1C: 5.2 % (ref <5.7)
MEAN PLASMA GLUCOSE: 103 mg/dL

## 2016-05-14 LAB — TSH: TSH: 1.62 mIU/L

## 2016-05-14 NOTE — Progress Notes (Signed)
[facility]                   Unk Pinto, M.D.    Uvaldo Bristle. Silverio Lay, [name]-C      Starlyn Skeans, [name]-C  [facility]                [address] [place] [id number] Telephone [phone] Telefax [phone]  Annual Screening/Preventative Visit  & Comprehensive Evaluation &  Examination     This very nice 75 y.o. [name] presents for a Wellness/Preventative Visit & comprehensive evaluation and management of multiple medical co-morbidities.  Patient has been followed for HTN, Prediabetes, Hyperlipidemia and Vitamin D Deficiency. 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 Currently Gabapentin controls her sx's.      HTN predates circa 2008. Patient's BP has been controlled at home and patient denies any cardiac symptoms as chest pain, palpitations, shortness of breath, dizziness or ankle swelling. Today's BP is  126/72     Patient's hyperlipidemia is controlled with diet and medications. Patient denies myalgias or other medication SE's. Last lipids were  Lab Results  Component Value Date   CHOL 155 02/11/2016   HDL 60 02/11/2016   LDLCALC 80 02/11/2016   TRIG 76 02/11/2016   CHOLHDL 2.6 02/11/2016      Patient has prediabetes with A1c 6.0% predating circa 2011 and patient denies reactive hypoglycemic symptoms, visual blurring, diabetic polys, or paresthesias.  Patient has lost about 30-35# over the Last 2 -3 years from improved eating behaviors and last A1c was in the normal range: Lab Results  Component Value Date   HGBA1C 5.5 02/11/2016      Finally, patient has history of Vitamin D Deficiency and last Vitamin D was at goal: Lab Results  Component Value Date   VD25OH 96 02/11/2016   Current Outpatient Prescriptions on File Prior to Visit  Medication Sig  . ALPRAZolam (XANAX) 0.5 MG tablet TAKE 1/2-1 tab THREE TIMES  DAILY AS NEEDED   . aspirin EC 81 MG  Take 162 mg by mouth at bedtime.  Marland Kitchen atorvastatin  80 MG t TAKE ONE TABLET BY MOUTH ONCE DAILY  . VITAMIN D 2000 units  Take 1 capsule by mouth daily.  Marland Kitchen estradiol  1 MG tablet TAKE 1 TABLET EVERY DAY  . FLAXSEED OIL  Take 1,000 mg by mouth 2 (two) times daily.   . Furosemide 80 MG  TAKE ONE TABLET BY MOUTH ONCE DAILY  . losartan-hctz  50-12.5  TAKE 1 TABLET BY MOUTH EVERY DAY  . LYRICA 150 MG  TAKE ONE CAPSULE BY MOUTH THREE TIMES DAILY  . PROVERA 2.5 MG  TAKE 1 TABLET EVERY DAY  . montelukast  10 MG  TAKE 1 TABLET BY MOUTH ONCE DAILY  . MultiVit w/Min Take 1 tablet by mouth daily.  . potassium chl  20 MEQ  Take 1 tablet (20 mEq total) by mouth daily.  Marland Kitchen VIT B-6 100 MG  Take 100 mg by mouth 2 (two) times daily.   Allergies  Allergen Reactions  . Oxycodone-Acetaminophen Other (See Comments)    panic attack  . Vit C-Cholecalciferol-Rose Hip [Cholecalciferol-Vitamin C] Other (See Comments)    dyspepsia  . Ibuprofen [Ibuprofen] Rash  . Macrodantin [Nitrofurantoin Macrocrystal] Rash   Past Medical History:  Diagnosis  Date  . Anemia   . Arthritis   . Hyperlipidemia   . Hypertension   . Pelvis fracture [facility]) March 26, 2002   both sides fx  . Pre-diabetes   . 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 Vitamin D deficiency    Health Maintenance  Topic Date Due  . INFLUENZA VACCINE  03/31/2016  . COLONOSCOPY  08/31/2016  . MAMMOGRAM  06/17/2017  . TETANUS/TDAP  01/27/2022  . DEXA SCAN  Completed  . ZOSTAVAX  Completed  . PNA vac Low Risk Adult  Completed   Immunization History  Administered Date(s) Administered  . Influenza, High Dose Seasonal PF 06/06/2014  . Influenza-Unspecified 06/13/2013, 05/02/2015  . Pneumococcal Conjugate-13 05/24/2014  . Pneumococcal Polysaccharide-23 01/08/2009  . Tdap 01/28/2012  . Zoster 08/31/2006   Past Surgical History:  Procedure Laterality Date  . right elbow   surgery  March 26, 2002  . right  hand ulner nerve surgery  March 26, 2002   I and d done right hand/wrist  . right shoulder humerus fx  july 2003   surgery done  . right total hip arthroplasty  2008  . right total knee arthroplasty  sept 2012  . right wrist plating  2004  . TONSILLECTOMY  age 7  . TOTAL HIP ARTHROPLASTY  10/22/2011   Procedure: TOTAL HIP ARTHROPLASTY ANTERIOR APPROACH;  Surgeon: Mauri Pole, MD;  Location: [facility];  Service: Orthopedics;  Laterality: Left;  Left Total Hip Arthroplasty,  Anterior Approach    Family History  Problem Relation Age of Onset  . Hypertension Mother   . Diabetes Mother   . Heart disease Mother   . Diabetes Father   . Heart disease Father    Social History  Substance Use Topics  . Smoking status: Never Smoker  . Smokeless tobacco: Never Used  . Alcohol use Yes     Comment: social    ROS Constitutional: Denies fever, chills, weight loss/gain, headaches, insomnia,  night sweats, and change in appetite. Does c/o fatigue. Eyes: Denies redness, blurred vision, diplopia, discharge, itchy, watery eyes.  ENT: Denies discharge, congestion, post nasal drip, epistaxis, sore throat, earache, hearing loss, dental pain, Tinnitus, Vertigo, Sinus pain, snoring.  Cardio: Denies chest pain, palpitations, irregular heartbeat, syncope, dyspnea, diaphoresis, orthopnea, PND, claudication, edema Respiratory: denies cough, dyspnea, DOE, pleurisy, hoarseness, laryngitis, wheezing.  Gastrointestinal: Denies dysphagia, heartburn, reflux, water brash, pain, cramps, nausea, vomiting, bloating, diarrhea, constipation, hematemesis, melena, hematochezia, jaundice, hemorrhoids Genitourinary: Denies dysuria, frequency, urgency, nocturia, hesitancy, discharge, hematuria, flank pain Breast: Breast lumps, nipple discharge, bleeding.  Musculoskeletal: Denies arthralgia, myalgia, stiffness, Jt. Swelling, pain, limp, and strain/sprain. Denies falls. Skin: Denies puritis, rash, hives, warts, acne, eczema,  changing in skin lesion Neuro: No weakness, tremor, incoordination, spasms, paresthesia, pain Psychiatric: Denies confusion, memory loss, sensory loss. Denies Depression. Endocrine: Denies change in weight, skin, hair change, nocturia, and paresthesia, diabetic polys, visual blurring, hyper / hypo glycemic episodes.  Heme/Lymph: No excessive bleeding, bruising, enlarged lymph nodes.  Physical Exam  BP 126/72   Pulse 72   Temp 97.4 F (36.3 C)   Resp 16   Ht 5\' 3"  (1.6 m)   Wt 146 lb (66.2 kg)   BMI 25.86 kg/m   General Appearance: Well nourished and in no apparent distress.  Eyes: PERRLA, EOMs, conjunctiva no swelling or erythema, normal fundi and vessels. Sinuses: No frontal/maxillary tenderness ENT/Mouth: EACs patent / TMs  nl. Nares clear without erythema, swelling, mucoid  exudates. Oral hygiene is good. No erythema, swelling, or exudate. Tongue normal, non-obstructing. Tonsils not swollen or erythematous. Hearing normal.  Neck: Supple, thyroid normal. No bruits, nodes or JVD. Respiratory: Respiratory effort normal.  BS equal and clear bilateral without rales, rhonci, wheezing or stridor. Cardio: Heart sounds are normal with regular rate and rhythm and no murmurs, rubs or gallops. Peripheral pulses are normal and equal bilaterally without edema. No aortic or femoral bruits. Chest: symmetric with normal excursions and percussion. Breasts: Symmetric, without lumps, nipple discharge, retractions, or fibrocystic changes.  Abdomen: Flat, soft with bowel sounds active. Nontender, no guarding, rebound, hernias, masses, or organomegaly.  Lymphatics: Non tender without lymphadenopathy.  Musculoskeletal: Full ROM all peripheral extremities, joint stability, 5/5 strength, and normal gait. Skin: Warm and dry without rashes, lesions, cyanosis, clubbing or  ecchymosis.  Neuro: Cranial nerves intact, reflexes equal bilaterally. Normal muscle tone, no cerebellar symptoms. Sensation intact.   Pysch: Alert and oriented X 3, normal affect, Insight and Judgment appropriate.   Assessment and Plan  1. Annual Preventative Screening Examination   Continue prudent diet as discussed, weight control, BP monitoring, regular exercise, and medications. Discussed med's effects and SE's. Screening labs and tests as requested with regular follow-up as recommended. Over 40 minutes of exam, counseling, chart review and high complex critical decision making was performed.

## 2016-05-14 NOTE — Patient Instructions (Signed)

## 2016-05-15 LAB — URINALYSIS, MICROSCOPIC ONLY
CASTS: NONE SEEN [LPF]
Crystals: NONE SEEN [HPF]
YEAST: NONE SEEN [HPF]

## 2016-05-15 LAB — URINALYSIS, ROUTINE W REFLEX MICROSCOPIC
Bilirubin Urine: NEGATIVE
GLUCOSE, UA: NEGATIVE
HGB URINE DIPSTICK: NEGATIVE
KETONES UR: NEGATIVE
Nitrite: NEGATIVE
PH: 6 (ref 5.0–8.0)
Protein, ur: NEGATIVE
SPECIFIC GRAVITY, URINE: 1.019 (ref 1.001–1.035)

## 2016-05-15 LAB — MICROALBUMIN / CREATININE URINE RATIO
CREATININE, URINE: 179 mg/dL (ref 20–320)
MICROALB UR: 1 mg/dL
Microalb Creat Ratio: 6 mcg/mg creat (ref <30)

## 2016-05-15 LAB — INSULIN, RANDOM: INSULIN: 4.5 u[IU]/mL (ref 2.0–19.6)

## 2016-05-15 LAB — VITAMIN D 25 HYDROXY (VIT D DEFICIENCY, FRACTURES): Vit D, 25-Hydroxy: 85 ng/mL (ref 30–100)

## 2016-05-22 ENCOUNTER — Other Ambulatory Visit: Payer: Self-pay | Admitting: Internal Medicine

## 2016-05-22 DIAGNOSIS — B351 Tinea unguium: Secondary | ICD-10-CM

## 2016-05-27 DIAGNOSIS — H401212 Low-tension glaucoma, right eye, moderate stage: Secondary | ICD-10-CM | POA: Diagnosis not present

## 2016-05-27 DIAGNOSIS — H16101 Unspecified superficial keratitis, right eye: Secondary | ICD-10-CM | POA: Diagnosis not present

## 2016-05-27 DIAGNOSIS — H04123 Dry eye syndrome of bilateral lacrimal glands: Secondary | ICD-10-CM | POA: Diagnosis not present

## 2016-05-27 DIAGNOSIS — H16102 Unspecified superficial keratitis, left eye: Secondary | ICD-10-CM | POA: Diagnosis not present

## 2016-06-26 ENCOUNTER — Other Ambulatory Visit: Payer: Self-pay | Admitting: Internal Medicine

## 2016-07-07 ENCOUNTER — Other Ambulatory Visit: Payer: Self-pay | Admitting: Internal Medicine

## 2016-07-07 DIAGNOSIS — Z1231 Encounter for screening mammogram for malignant neoplasm of breast: Secondary | ICD-10-CM

## 2016-08-04 ENCOUNTER — Other Ambulatory Visit: Payer: Self-pay | Admitting: Internal Medicine

## 2016-08-04 NOTE — Telephone Encounter (Signed)
Please call Lyrica 

## 2016-08-07 ENCOUNTER — Ambulatory Visit
Admission: RE | Admit: 2016-08-07 | Discharge: 2016-08-07 | Disposition: A | Discharge status: Home / Self Care | Payer: PPO | Source: Ambulatory Visit | Attending: Internal Medicine | Admitting: Internal Medicine

## 2016-08-07 DIAGNOSIS — Z1231 Encounter for screening mammogram for malignant neoplasm of breast: Secondary | ICD-10-CM | POA: Diagnosis not present

## 2016-08-12 DIAGNOSIS — H0014 Chalazion left upper eyelid: Secondary | ICD-10-CM | POA: Diagnosis not present

## 2016-09-02 ENCOUNTER — Encounter: Payer: Self-pay | Admitting: Internal Medicine

## 2016-09-02 ENCOUNTER — Ambulatory Visit (INDEPENDENT_AMBULATORY_CARE_PROVIDER_SITE_OTHER): Payer: PPO | Admitting: Internal Medicine

## 2016-09-02 VITALS — BP 124/66 | HR 80 | Temp 98.0°F | Resp 16 | Ht 63.0 in | Wt 150.0 lb

## 2016-09-02 DIAGNOSIS — E782 Mixed hyperlipidemia: Secondary | ICD-10-CM

## 2016-09-02 DIAGNOSIS — R7309 Other abnormal glucose: Secondary | ICD-10-CM

## 2016-09-02 DIAGNOSIS — F411 Generalized anxiety disorder: Secondary | ICD-10-CM | POA: Diagnosis not present

## 2016-09-02 DIAGNOSIS — E559 Vitamin D deficiency, unspecified: Secondary | ICD-10-CM

## 2016-09-02 DIAGNOSIS — I10 Essential (primary) hypertension: Secondary | ICD-10-CM | POA: Diagnosis not present

## 2016-09-02 DIAGNOSIS — B351 Tinea unguium: Secondary | ICD-10-CM

## 2016-09-02 DIAGNOSIS — Z79899 Other long term (current) drug therapy: Secondary | ICD-10-CM | POA: Diagnosis not present

## 2016-09-02 LAB — CBC WITH DIFFERENTIAL/PLATELET
BASOS ABS: 0 {cells}/uL (ref 0–200)
BASOS PCT: 0 %
EOS PCT: 3 %
Eosinophils Absolute: 159 cells/uL (ref 15–500)
HCT: 38.6 % (ref 35.0–45.0)
HEMOGLOBIN: 12.6 g/dL (ref 11.7–15.5)
LYMPHS ABS: 1802 {cells}/uL (ref 850–3900)
Lymphocytes Relative: 34 %
MCH: 30.4 pg (ref 27.0–33.0)
MCHC: 32.6 g/dL (ref 32.0–36.0)
MCV: 93.2 fL (ref 80.0–100.0)
MPV: 10.9 fL (ref 7.5–12.5)
Monocytes Absolute: 636 cells/uL (ref 200–950)
Monocytes Relative: 12 %
NEUTROS ABS: 2703 {cells}/uL (ref 1500–7800)
Neutrophils Relative %: 51 %
Platelets: 302 10*3/uL (ref 140–400)
RBC: 4.14 MIL/uL (ref 3.80–5.10)
RDW: 14.6 % (ref 11.0–15.0)
WBC: 5.3 10*3/uL (ref 3.8–10.8)

## 2016-09-02 LAB — HEPATIC FUNCTION PANEL
ALK PHOS: 87 U/L (ref 33–130)
ALT: 24 U/L (ref 6–29)
AST: 26 U/L (ref 10–35)
Albumin: 3.9 g/dL (ref 3.6–5.1)
BILIRUBIN DIRECT: 0.1 mg/dL (ref <=0.2)
BILIRUBIN INDIRECT: 0.4 mg/dL (ref 0.2–1.2)
Total Bilirubin: 0.5 mg/dL (ref 0.2–1.2)
Total Protein: 6.4 g/dL (ref 6.1–8.1)

## 2016-09-02 LAB — BASIC METABOLIC PANEL WITH GFR
BUN: 23 mg/dL (ref 7–25)
CHLORIDE: 101 mmol/L (ref 98–110)
CO2: 30 mmol/L (ref 20–31)
Calcium: 8.6 mg/dL (ref 8.6–10.4)
Creat: 0.91 mg/dL (ref 0.60–0.93)
GFR, EST NON AFRICAN AMERICAN: 62 mL/min (ref >=60)
GFR, Est African American: 72 mL/min (ref >=60)
Glucose, Bld: 85 mg/dL (ref 65–99)
Potassium: 4 mmol/L (ref 3.5–5.3)
SODIUM: 138 mmol/L (ref 135–146)

## 2016-09-02 LAB — HEMOGLOBIN A1C
Hgb A1c MFr Bld: 5.2 % (ref <5.7)
MEAN PLASMA GLUCOSE: 103 mg/dL

## 2016-09-02 LAB — LIPID PANEL
Cholesterol: 183 mg/dL (ref <200)
HDL: 74 mg/dL (ref >50)
LDL CALC: 96 mg/dL (ref <100)
Total CHOL/HDL Ratio: 2.5 Ratio (ref <5.0)
Triglycerides: 65 mg/dL (ref <150)
VLDL: 13 mg/dL (ref <30)

## 2016-09-02 LAB — TSH: TSH: 1.66 m[IU]/L

## 2016-09-02 NOTE — Progress Notes (Signed)
Assessment and Plan:  Hypertension:  -Continue medication,  -monitor blood pressure at home.  -Continue DASH diet.   -Reminder to go to the ER if any CP, SOB, nausea, dizziness, severe HA, changes vision/speech, left arm numbness and tingling, and jaw pain.  Cholesterol: -stop lipitor -if cholesterol still good in 3 months will stay of  -cont fish oils -Continue diet and exercise.  -Check cholesterol.   Pre-diabetes: -Continue diet and exercise.  -Check A1C  Vitamin D Def: -continue medications.   Onychomycosis -take 1 month off lamasil -take every other month until 90 tablets gone  Anxiety -stable -encouraged sparing use of xanax only if needed  Patient expressed strong desire to cut back on the amount of pills that she is currently taking.  She will cut B6, aspirin as she has not had any primary heart attacks or strokes, multivitamin, and lipitor for now.  Continue diet and meds as discussed. Further disposition pending results of labs.  HPI 76 y.o. female  presents for 3 month follow up with hypertension, hyperlipidemia, prediabetes and vitamin D.   Her blood pressure has been controlled at home, today their BP is BP: 124/66.   She does not workout. She denies chest pain, shortness of breath, dizziness.   She is on cholesterol medication and denies myalgias. Her cholesterol is at goal. The cholesterol last visit was:   Lab Results  Component Value Date   CHOL 163 05/14/2016   HDL 69 05/14/2016   LDLCALC 74 05/14/2016   TRIG 101 05/14/2016   CHOLHDL 2.4 05/14/2016     She has been working on diet and exercise for prediabetes, and denies foot ulcerations, hyperglycemia, hypoglycemia , increased appetite, nausea, paresthesia of the feet, polydipsia, polyuria, visual disturbances, vomiting and weight loss. Last A1C in the office was:  Lab Results  Component Value Date   HGBA1C 5.2 05/14/2016    Patient is on Vitamin D supplement.  Lab Results  Component Value  Date   VD25OH 71 05/14/2016     She is still having some trouble with her neck and parastesthias of her bilateral hands at nighttime.  She reports that she did move the lyrica closer to dinner time and this helped.  She does not get the parasthesia when she lays on her back.    Her anxiety has been stable.  She has been trying to limit her use of xanax.   She has had some good clearance of her toenails.    She feels like she is taking a lot of pills right now.  She would like to stop some of these medications.     Current Medications:  Current Outpatient Prescriptions on File Prior to Visit  Medication Sig Dispense Refill  . ALPRAZolam (XANAX) 0.5 MG tablet TAKE ONE-HALF TO ONE TABLET BY MOUTH THREE TIMES DAILY AS NEEDED FOR ANXIETY 90 tablet 3  . aspirin EC 81 MG tablet Take 162 mg by mouth at bedtime.    Marland Kitchen atorvastatin (LIPITOR) 80 MG tablet TAKE ONE TABLET BY MOUTH ONCE DAILY 30 tablet 2  . Cholecalciferol (VITAMIN D) 2000 units CAPS Take 1 capsule by mouth daily.    Marland Kitchen estradiol (ESTRACE) 1 MG tablet TAKE 1 TABLET EVERY DAY 90 tablet 1  . Flaxseed, Linseed, (FLAXSEED OIL PO) Take 1,000 mg by mouth 2 (two) times daily.     . furosemide (LASIX) 80 MG tablet TAKE ONE TABLET BY MOUTH ONCE DAILY 90 tablet 1  . losartan-hydrochlorothiazide (HYZAAR) 50-12.5 MG tablet TAKE  1 TABLET BY MOUTH EVERY DAY 90 tablet 2  . LYRICA 150 MG capsule TAKE ONE CAPSULE BY MOUTH THREE TIMES DAILY 270 capsule 1  . medroxyPROGESTERone (PROVERA) 2.5 MG tablet TAKE 1 TABLET EVERY DAY 90 tablet 1  . montelukast (SINGULAIR) 10 MG tablet TAKE 1 TABLET BY MOUTH ONCE DAILY 90 tablet 1  . Multiple Vitamin (MULITIVITAMIN WITH MINERALS) TABS Take 1 tablet by mouth daily.    . potassium chloride SA (K-DUR,KLOR-CON) 20 MEQ tablet Take 1 tablet (20 mEq total) by mouth daily. 90 tablet 2  . pyridOXINE (VITAMIN B-6) 100 MG tablet Take 100 mg by mouth 2 (two) times daily.    Marland Kitchen terbinafine (LAMISIL) 250 MG tablet TAKE ONE  TABLET BY MOUTH DAILY 90 tablet 1  . timolol (TIMOPTIC) 0.5 % ophthalmic solution      No current facility-administered medications on file prior to visit.     Medical History:  Past Medical History:  Diagnosis Date  . Anemia   . Arthritis   . Hyperlipidemia   . Hypertension   . Pelvis fracture [facility]) March 26, 2002   both sides fx  . Pre-diabetes   . 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 Vitamin D deficiency     Allergies:  Allergies  Allergen Reactions  . Oxycodone-Acetaminophen Other (See Comments)    panic attack  . Vit C-Cholecalciferol-Rose Hip [Cholecalciferol-Vitamin C] Other (See Comments)    dyspepsia  . Ibuprofen [Ibuprofen] Rash  . Macrodantin [Nitrofurantoin Macrocrystal] Rash     Review of Systems:  Review of Systems  Constitutional: Negative for chills, fever and malaise/fatigue.  HENT: Negative for congestion, ear pain and sore throat.   Eyes: Negative.   Respiratory: Negative for cough, shortness of breath and wheezing.   Cardiovascular: Negative for chest pain, palpitations and leg swelling.  Gastrointestinal: Negative for abdominal pain, blood in stool, constipation, diarrhea, heartburn and melena.  Genitourinary: Negative.   Skin: Negative.   Neurological: Negative for dizziness, sensory change, loss of consciousness and headaches.  Psychiatric/Behavioral: Negative for depression. The patient is not nervous/anxious and does not have insomnia.     Family history- Review and unchanged  Social history- Review and unchanged  Physical Exam: BP 124/66   Pulse 80   Temp 98 F (36.7 C) (Temporal)   Resp 16   Ht 5\' 3"  (1.6 m)   Wt 150 lb (68 kg)   BMI 26.57 kg/m  Wt Readings from Last 3 Encounters:  09/02/16 150 lb (68 kg)  05/14/16 146 lb (66.2 kg)  02/11/16 152 lb 9.6 oz (69.2 kg)    General Appearance: Well nourished well developed, in no apparent distress. Eyes: PERRLA, EOMs, conjunctiva no swelling or erythema ENT/Mouth: Ear  canals normal without obstruction, swelling, erythma, discharge.  TMs normal bilaterally.  Oropharynx moist, clear, without exudate, or postoropharyngeal swelling. Neck: Supple, thyroid normal,no cervical adenopathy  Respiratory: Respiratory effort normal, Breath sounds clear A&P without rhonchi, wheeze, or rale.  No retractions, no accessory usage. Cardio: RRR with no MRGs. Brisk peripheral pulses without edema.  Abdomen: Soft, + BS,  Non tender, no guarding, rebound, hernias, masses. Musculoskeletal: Full ROM, 5/5 strength, Normal gait Skin: Warm, dry without rashes, lesions, ecchymosis.  Neuro: Awake and oriented X 3, Cranial nerves intact. Normal muscle tone, no cerebellar symptoms. Psych: Normal affect, Insight and Judgment appropriate.    Starlyn Skeans, PA-C 9:03 AM [facility]

## 2016-09-14 DIAGNOSIS — H40012 Open angle with borderline findings, low risk, left eye: Secondary | ICD-10-CM | POA: Diagnosis not present

## 2016-09-14 DIAGNOSIS — H401212 Low-tension glaucoma, right eye, moderate stage: Secondary | ICD-10-CM | POA: Diagnosis not present

## 2016-09-14 DIAGNOSIS — H0014 Chalazion left upper eyelid: Secondary | ICD-10-CM | POA: Diagnosis not present

## 2016-10-19 DIAGNOSIS — H0014 Chalazion left upper eyelid: Secondary | ICD-10-CM | POA: Diagnosis not present

## 2016-10-19 DIAGNOSIS — H401212 Low-tension glaucoma, right eye, moderate stage: Secondary | ICD-10-CM | POA: Diagnosis not present

## 2016-10-19 DIAGNOSIS — H40012 Open angle with borderline findings, low risk, left eye: Secondary | ICD-10-CM | POA: Diagnosis not present

## 2016-10-27 ENCOUNTER — Other Ambulatory Visit: Payer: Self-pay | Admitting: Internal Medicine

## 2016-12-16 ENCOUNTER — Encounter: Payer: Self-pay | Admitting: Internal Medicine

## 2016-12-16 ENCOUNTER — Ambulatory Visit (INDEPENDENT_AMBULATORY_CARE_PROVIDER_SITE_OTHER): Payer: PPO | Admitting: Internal Medicine

## 2016-12-16 VITALS — BP 108/56 | HR 64 | Temp 97.0°F | Resp 16 | Ht 63.0 in | Wt 149.4 lb

## 2016-12-16 DIAGNOSIS — R7309 Other abnormal glucose: Secondary | ICD-10-CM | POA: Diagnosis not present

## 2016-12-16 DIAGNOSIS — Z79899 Other long term (current) drug therapy: Secondary | ICD-10-CM | POA: Diagnosis not present

## 2016-12-16 DIAGNOSIS — E782 Mixed hyperlipidemia: Secondary | ICD-10-CM

## 2016-12-16 DIAGNOSIS — E559 Vitamin D deficiency, unspecified: Secondary | ICD-10-CM | POA: Diagnosis not present

## 2016-12-16 DIAGNOSIS — R7303 Prediabetes: Secondary | ICD-10-CM

## 2016-12-16 DIAGNOSIS — I10 Essential (primary) hypertension: Secondary | ICD-10-CM | POA: Diagnosis not present

## 2016-12-16 LAB — HEPATIC FUNCTION PANEL
ALT: 25 U/L (ref 6–29)
AST: 26 U/L (ref 10–35)
Albumin: 4.2 g/dL (ref 3.6–5.1)
Alkaline Phosphatase: 94 U/L (ref 33–130)
BILIRUBIN DIRECT: 0.1 mg/dL (ref <=0.2)
BILIRUBIN INDIRECT: 0.4 mg/dL (ref 0.2–1.2)
TOTAL PROTEIN: 7 g/dL (ref 6.1–8.1)
Total Bilirubin: 0.5 mg/dL (ref 0.2–1.2)

## 2016-12-16 LAB — BASIC METABOLIC PANEL WITH GFR
BUN: 37 mg/dL — ABNORMAL HIGH (ref 7–25)
CALCIUM: 9.2 mg/dL (ref 8.6–10.4)
CHLORIDE: 101 mmol/L (ref 98–110)
CO2: 28 mmol/L (ref 20–31)
CREATININE: 1.02 mg/dL — AB (ref 0.60–0.93)
GFR, Est African American: 63 mL/min (ref >=60)
GFR, Est Non African American: 54 mL/min — ABNORMAL LOW (ref >=60)
GLUCOSE: 87 mg/dL (ref 65–99)
Potassium: 4.1 mmol/L (ref 3.5–5.3)
SODIUM: 139 mmol/L (ref 135–146)

## 2016-12-16 LAB — LIPID PANEL
CHOL/HDL RATIO: 2.2 ratio (ref <5.0)
CHOLESTEROL: 178 mg/dL (ref <200)
HDL: 80 mg/dL (ref >50)
LDL Cholesterol: 88 mg/dL (ref <100)
Triglycerides: 49 mg/dL (ref <150)
VLDL: 10 mg/dL (ref <30)

## 2016-12-16 LAB — CBC WITH DIFFERENTIAL/PLATELET
BASOS ABS: 57 {cells}/uL (ref 0–200)
Basophils Relative: 1 %
EOS ABS: 114 {cells}/uL (ref 15–500)
Eosinophils Relative: 2 %
HEMATOCRIT: 41.3 % (ref 35.0–45.0)
HEMOGLOBIN: 13.6 g/dL (ref 11.7–15.5)
LYMPHS ABS: 1653 {cells}/uL (ref 850–3900)
Lymphocytes Relative: 29 %
MCH: 30.4 pg (ref 27.0–33.0)
MCHC: 32.9 g/dL (ref 32.0–36.0)
MCV: 92.2 fL (ref 80.0–100.0)
MONO ABS: 627 {cells}/uL (ref 200–950)
MPV: 11.4 fL (ref 7.5–12.5)
Monocytes Relative: 11 %
Neutro Abs: 3249 cells/uL (ref 1500–7800)
Neutrophils Relative %: 57 %
Platelets: 304 10*3/uL (ref 140–400)
RBC: 4.48 MIL/uL (ref 3.80–5.10)
RDW: 14.8 % (ref 11.0–15.0)
WBC: 5.7 10*3/uL (ref 3.8–10.8)

## 2016-12-16 LAB — TSH: TSH: 2.09 m[IU]/L

## 2016-12-16 NOTE — Patient Instructions (Signed)

## 2016-12-16 NOTE — Progress Notes (Signed)
This very nice 76 y.o. [name] presents for 3 month follow up with Hypertension, Hyperlipidemia, Pre-Diabetes and Vitamin D Deficiency. Other problems include hx/o RSD/CRPS of her R hand consequent of an accident in 2003 and for which she's on Lyrica with control of her pain.      Patient is treated for HTN (2008) & BP has been controlled at home. Today's BP is at goal - 108/56. Patient has had no complaints of any cardiac type chest pain, palpitations, dyspnea/orthopnea/PND, dizziness, claudication, or dependent edema.     Hyperlipidemia is controlled with diet & meds. Patient denies myalgias or other med SE's. Last Lipids were at goal: Lab Results  Component Value Date   CHOL 183 09/02/2016   HDL 74 09/02/2016   LDLCALC 96 09/02/2016   TRIG 65 09/02/2016   CHOLHDL 2.5 09/02/2016      Also, the patient has history of PreDiabetes (A1c 6.0% in 2011) and has had no symptoms of reactive hypoglycemia, diabetic polys, paresthesias or visual blurring.  Last A1c was at goal: Lab Results  Component Value Date   HGBA1C 5.2 09/02/2016      Further, the patient also has history of Vitamin D Deficiency and supplements vitamin D without any suspected side-effects. Last vitamin D was at goal: Lab Results  Component Value Date   VD25OH 74 05/14/2016   Current Outpatient Prescriptions on File Prior to Visit  Medication Sig  . ALPRAZolam (XANAX) 0.5 MG tablet TAKE ONE-HALF TO ONE TABLET BY MOUTH THREE TIMES DAILY AS NEEDED FOR ANXIETY  . aspirin EC 81 MG tablet Take 162 mg by mouth at bedtime.  . Cholecalciferol (VITAMIN D) 2000 units CAPS Take 1 capsule by mouth daily.  Marland Kitchen estradiol (ESTRACE) 1 MG tablet TAKE 1 TABLET EVERY DAY  . furosemide (LASIX) 80 MG tablet TAKE ONE TABLET BY MOUTH ONCE DAILY  . losartan-hydrochlorothiazide (HYZAAR) 50-12.5 MG tablet TAKE 1 TABLET BY MOUTH EVERY DAY  . LYRICA 150 MG capsule TAKE ONE CAPSULE BY MOUTH THREE TIMES DAILY  . medroxyPROGESTERone (PROVERA) 2.5 MG  tablet TAKE 1 TABLET EVERY DAY  . montelukast (SINGULAIR) 10 MG tablet TAKE 1 TABLET BY MOUTH ONCE DAILY  . Omega-3 Fatty Acids (FISH OIL) 1000 MG CAPS Take by mouth 2 (two) times daily.  . potassium chloride SA (K-DUR,KLOR-CON) 20 MEQ tablet Take 1 tablet (20 mEq total) by mouth daily.  Marland Kitchen terbinafine (LAMISIL) 250 MG tablet TAKE ONE TABLET BY MOUTH DAILY  . timolol (TIMOPTIC) 0.5 % ophthalmic solution    No current facility-administered medications on file prior to visit.    Allergies  Allergen Reactions  . Oxycodone-Acetaminophen Other (See Comments)    panic attack  . Vit C-Cholecalciferol-Rose Hip [Cholecalciferol-Vitamin C] Other (See Comments)    dyspepsia  . Ibuprofen [Ibuprofen] Rash  . Macrodantin [Nitrofurantoin Macrocrystal] Rash   PMHx:   Past Medical History:  Diagnosis Date  . Anemia   . Arthritis   . Hyperlipidemia   . Hypertension   . Pelvis fracture [facility]) March 26, 2002   both sides fx  . Pre-diabetes   . 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 Vitamin D deficiency    Immunization History  Administered Date(s) Administered  . Influenza, High Dose Seasonal PF 06/06/2014, 05/14/2016  . Influenza-Unspecified 06/13/2013, 05/02/2015  . Pneumococcal Conjugate-13 05/24/2014  . Pneumococcal Polysaccharide-23 01/08/2009  . Tdap 01/28/2012  . Zoster 08/31/2006   Past Surgical History:  Procedure Laterality Date  .  right elbow   surgery  March 26, 2002  . right hand ulner nerve surgery  March 26, 2002   I and d done right hand/wrist  . right shoulder humerus fx  july 2003   surgery done  . right total hip arthroplasty  2008  . right total knee arthroplasty  sept 2012  . right wrist plating  2004  . TONSILLECTOMY  age 16  . TOTAL HIP ARTHROPLASTY  10/22/2011   Procedure: TOTAL HIP ARTHROPLASTY ANTERIOR APPROACH;  Surgeon: Mauri Pole, MD;  Location: [facility];  Service: Orthopedics;  Laterality: Left;  Left Total Hip Arthroplasty,  Anterior Approach     FHx:    Reviewed / unchanged  SHx:    Reviewed / unchanged  Systems Review:  Constitutional: Denies fever, chills, wt changes, headaches, insomnia, fatigue, night sweats, change in appetite. Eyes: Denies redness, blurred vision, diplopia, discharge, itchy, watery eyes.  ENT: Denies discharge, congestion, post nasal drip, epistaxis, sore throat, earache, hearing loss, dental pain, tinnitus, vertigo, sinus pain, snoring.  CV: Denies chest pain, palpitations, irregular heartbeat, syncope, dyspnea, diaphoresis, orthopnea, PND, claudication or edema. Respiratory: denies cough, dyspnea, DOE, pleurisy, hoarseness, laryngitis, wheezing.  Gastrointestinal: Denies dysphagia, odynophagia, heartburn, reflux, water brash, abdominal pain or cramps, nausea, vomiting, bloating, diarrhea, constipation, hematemesis, melena, hematochezia  or hemorrhoids. Genitourinary: Denies dysuria, frequency, urgency, nocturia, hesitancy, discharge, hematuria or flank pain. Musculoskeletal: Denies arthralgias, myalgias, stiffness, jt. swelling, pain, limping or strain/sprain.  Skin: Denies pruritus, rash, hives, warts, acne, eczema or change in skin lesion(s). Neuro: No weakness, tremor, incoordination, spasms, paresthesia or pain. Psychiatric: Denies confusion, memory loss or sensory loss. Endo: Denies change in weight, skin or hair change.  Heme/Lymph: No excessive bleeding, bruising or enlarged lymph nodes.  Physical Exam  BP (!) 108/56   Pulse 64   Temp 97 F (36.1 C)   Resp 16   Ht 5\' 3"  (1.6 m)   Wt 149 lb 6.4 oz (67.8 kg)   BMI 26.47 kg/m   Appears well nourished, well groomed  and in no distress.  Eyes: PERRLA, EOMs, conjunctiva no swelling or erythema. Sinuses: No frontal/maxillary tenderness ENT/Mouth: EAC's clear, TM's nl w/o erythema, bulging. Nares clear w/o erythema, swelling, exudates. Oropharynx clear without erythema or exudates. Oral hygiene is good. Tongue normal, non obstructing. Hearing  intact.  Neck: Supple. Thyroid nl. Car 2+/2+ without bruits, nodes or JVD. Chest: Respirations nl with BS clear & equal w/o rales, rhonchi, wheezing or stridor.  Cor: Heart sounds normal w/ regular rate and rhythm without sig. murmurs, gallops, clicks or rubs. Peripheral pulses normal and equal  without edema.  Abdomen: Soft & bowel sounds normal. Non-tender w/o guarding, rebound, hernias, masses or organomegaly.  Lymphatics: Unremarkable.  Musculoskeletal: Full ROM all peripheral extremities, joint stability, 5/5 strength and normal gait.  Skin: Warm, dry without exposed rashes, lesions or ecchymosis apparent.  Neuro: Cranial nerves intact, reflexes equal bilaterally. Sensory-motor testing grossly intact. Tendon reflexes grossly intact.  Pysch: Alert & oriented x 3.  Insight and judgement nl & appropriate. No ideations.  Assessment and Plan:  1. Essential hypertension  - Continue medication, monitor blood pressure at home.  - Continue DASH diet. Reminder to go to the ER if any CP,  SOB, nausea, dizziness, severe HA, changes vision/speech,  left arm numbness and tingling and jaw pain.  - CBC with Differential/Platelet - BASIC METABOLIC PANEL WITH GFR - Magnesium - TSH  2. Mixed hyperlipidemia  - Continue diet/meds, exercise,& lifestyle modifications.  -  Continue monitor periodic cholesterol/liver & renal functions   - Hepatic function panel - Lipid panel - TSH  3. Prediabetes  - Continue diet, exercise, lifestyle modifications.  - Monitor appropriate labs.  - Hemoglobin A1c - Insulin, random  4. Vitamin D deficiency  - Continue supplementation.  - VITAMIN D 25 Hydroxy   5. Abnormal glucose  - Hemoglobin A1c - Insulin, random  6. Medication management  - CBC with Differential/Platelet - BASIC METABOLIC PANEL WITH GFR - Hepatic function panel - Magnesium - Lipid panel - TSH - Hemoglobin A1c - Insulin, random - VITAMIN D 25 Hydroxy       Discussed   regular exercise, BP monitoring, weight control to achieve/maintain BMI less than 25 and discussed med and SE's. Recommended labs to assess and monitor clinical status with further disposition pending results of labs. Over 30 minutes of exam, counseling, chart review was performed.

## 2016-12-17 LAB — HEMOGLOBIN A1C
HEMOGLOBIN A1C: 5.3 % (ref <5.7)
MEAN PLASMA GLUCOSE: 105 mg/dL

## 2016-12-17 LAB — MAGNESIUM: Magnesium: 2 mg/dL (ref 1.5–2.5)

## 2016-12-17 LAB — VITAMIN D 25 HYDROXY (VIT D DEFICIENCY, FRACTURES): VIT D 25 HYDROXY: 80 ng/mL (ref 30–100)

## 2016-12-17 LAB — INSULIN, RANDOM: INSULIN: 4.7 u[IU]/mL (ref 2.0–19.6)

## 2016-12-26 ENCOUNTER — Other Ambulatory Visit: Payer: Self-pay | Admitting: Internal Medicine

## 2017-02-17 DIAGNOSIS — Z124 Encounter for screening for malignant neoplasm of cervix: Secondary | ICD-10-CM | POA: Diagnosis not present

## 2017-02-17 DIAGNOSIS — Z1389 Encounter for screening for other disorder: Secondary | ICD-10-CM | POA: Diagnosis not present

## 2017-02-17 DIAGNOSIS — Z01419 Encounter for gynecological examination (general) (routine) without abnormal findings: Secondary | ICD-10-CM | POA: Diagnosis not present

## 2017-02-17 DIAGNOSIS — Z130 Encounter for screening for diseases of the blood and blood-forming organs and certain disorders involving the immune mechanism: Secondary | ICD-10-CM | POA: Diagnosis not present

## 2017-02-17 DIAGNOSIS — Z79890 Hormone replacement therapy (postmenopausal): Secondary | ICD-10-CM | POA: Diagnosis not present

## 2017-02-18 ENCOUNTER — Other Ambulatory Visit: Payer: Self-pay | Admitting: Internal Medicine

## 2017-02-18 ENCOUNTER — Telehealth: Payer: Self-pay | Admitting: *Deleted

## 2017-02-18 DIAGNOSIS — B351 Tinea unguium: Secondary | ICD-10-CM

## 2017-02-18 NOTE — Telephone Encounter (Signed)
Per Dr Melford Aase, the patient was instructed to take Terbinafine 250 mg every other month, and not 3 months in a row.  A message was left to inform the patient.

## 2017-03-18 DIAGNOSIS — H401212 Low-tension glaucoma, right eye, moderate stage: Secondary | ICD-10-CM | POA: Diagnosis not present

## 2017-03-18 DIAGNOSIS — H5211 Myopia, right eye: Secondary | ICD-10-CM | POA: Diagnosis not present

## 2017-03-18 DIAGNOSIS — H401221 Low-tension glaucoma, left eye, mild stage: Secondary | ICD-10-CM | POA: Diagnosis not present

## 2017-03-18 DIAGNOSIS — H04123 Dry eye syndrome of bilateral lacrimal glands: Secondary | ICD-10-CM | POA: Diagnosis not present

## 2017-03-29 NOTE — Progress Notes (Signed)
MEDICARE ANNUAL WELLNESS VISIT AND FOLLOW UP  Assessment:   Essential hypertension - continue medications, DASH diet, exercise and monitor at home. Call if greater than 130/80.  -     CBC with Differential/Platelet -     BASIC METABOLIC PANEL WITH GFR -     Hepatic function panel -     TSH  Hyperlipidemia -continue medications, check lipids, decrease fatty foods, increase activity.  -     Lipid panel  Abnormal glucose Discussed general issues about diabetes pathophysiology and management., Educational material distributed., Suggested low cholesterol diet., Encouraged aerobic exercise., Discussed foot care., Reminded to get yearly retinal exam.  Medication management -     Magnesium  Encounter for Medicare annual wellness exam Colonoscopy- patient declines a colonoscopy even though the risks and benefits were discussed at length. Colon cancer is 3rd most diagnosed cancer and 2nd leading cause of death in both men and women 15 years of age and older. Patient understands the risk of cancer and death with declining the test however they are willing to do cologuard screening instead. They understand that this is not as sensitive or specific as a colonoscopy and they are still recommended to get a colonoscopy. The cologuard will be sent out to their house.   Morbid Obesity with co morbidities - long discussion about weight loss, diet, and exercise  Generalized anxiety disorder continue medications, stress management techniques discussed, increase water, good sleep hygiene discussed, increase exercise, and increase veggies.   Cervical radicular pain Continue ortho follow up  Open-angle glaucoma, unspecified glaucoma stage, unspecified laterality, unspecified open-angle glaucoma type Continue eye doctor follow up  Advanced care planning/counseling discussion Discussed advanced care planning with patient We have a copy in epic, if it changes will bring in new copy Discussed DNR, not at  this time  Vitamin D deficiency Continue supplement  Over 30 minutes of exam, counseling, chart review, and critical decision making was performed Future Appointments Date Time Provider [place]  07/14/2017 11:00 AM Unk Pinto, MD [facility] None     Plan:   During the course of the visit the patient was educated and counseled about appropriate screening and preventive services including:    Pneumococcal vaccine   Influenza vaccine  Td vaccine  Prevnar 13  Screening electrocardiogram  Screening mammography  Bone densitometry screening  Colorectal cancer screening  Diabetes screening  Glaucoma screening  Nutrition counseling   Advanced directives: given info/requested copies   Subjective:   Gwendolyn [name] is a 76 y.o. female who presents for Medicare Annual Wellness Visit and 3 month follow up on hypertension, prediabetes, hyperlipidemia, vitamin D def.   Her blood pressure has been controlled at home, today their BP is BP: 120/82  She does workout, goes to [employer] 3 days a week. She denies chest pain, shortness of breath, dizziness. She takes lasix AS needed for swelling but has severe leg cramps that night in her ankles, takes the kdur 37meq with it but still with pain.  She has  She is on estrogen and progesterone, she is on bASA, only on half.   She is on cholesterol medication and denies myalgias. Her cholesterol is at goal. The cholesterol last visit was:   Lab Results  Component Value Date   CHOL 178 12/16/2016   HDL 80 12/16/2016   LDLCALC 88 12/16/2016   TRIG 49 12/16/2016   CHOLHDL 2.2 12/16/2016    She has been working on diet and exercise for prediabetes, and denies  paresthesia of the feet, polydipsia, polyuria and visual disturbances. Last A1C in the office was:  Lab Results  Component Value Date   HGBA1C 5.3 12/16/2016   Patient is on Vitamin D supplement.   Lab Results  Component Value Date   VD25OH 80  12/16/2016     BMI is Body mass index is 27.46 kg/m., she is working on diet and exercise, she is doing [employer]. Wt Readings from Last 3 Encounters:  03/31/17 155 lb (70.3 kg)  12/16/16 149 lb 6.4 oz (67.8 kg)  09/02/16 150 lb (68 kg)    Medication Review Current Outpatient Prescriptions on File Prior to Visit  Medication Sig  . ALPRAZolam (XANAX) 0.5 MG tablet TAKE ONE-HALF TO ONE TABLET BY MOUTH THREE TIMES DAILY AS NEEDED FOR ANXIETY  . aspirin EC 81 MG tablet Take 162 mg by mouth at bedtime.  . Cholecalciferol (VITAMIN D) 2000 units CAPS Take 1 capsule by mouth daily.  Marland Kitchen estradiol (ESTRACE) 1 MG tablet TAKE 1 TABLET EVERY DAY  . furosemide (LASIX) 80 MG tablet TAKE ONE TABLET BY MOUTH ONCE DAILY  . losartan-hydrochlorothiazide (HYZAAR) 50-12.5 MG tablet TAKE 1 TABLET BY MOUTH EVERY DAY  . LYRICA 150 MG capsule TAKE ONE CAPSULE BY MOUTH THREE TIMES DAILY  . medroxyPROGESTERone (PROVERA) 2.5 MG tablet TAKE 1 TABLET EVERY DAY  . montelukast (SINGULAIR) 10 MG tablet TAKE 1 TABLET BY MOUTH ONCE DAILY  . Omega-3 Fatty Acids (FISH OIL) 1000 MG CAPS Take by mouth 2 (two) times daily.  . potassium chloride SA (K-DUR,KLOR-CON) 20 MEQ tablet Take 1 tablet (20 mEq total) by mouth daily.  . timolol (TIMOPTIC) 0.5 % ophthalmic solution   . terbinafine (LAMISIL) 250 MG tablet TAKE ONE TABLET BY MOUTH ONCE DAILY (Patient not taking: Reported on 03/31/2017)   No current facility-administered medications on file prior to visit.     Current Problems (verified) Patient Active Problem List   Diagnosis Date Noted  . Open-angle glaucoma 05/14/2016  . Generalized anxiety disorder 02/11/2016  . Cervical radicular pain 02/11/2016  . [facility] O6712 on 08/21/2014 due Dec 2016 04/25/2015  . Morbid obesity (BMI 33.38) 04/25/2015  . Medication management 02/12/2014  . Essential hypertension 08/08/2013  . Hyperlipidemia 08/08/2013  . Abnormal glucose 08/08/2013  . Vitamin D  deficiency 08/08/2013    Screening Tests Immunization History  Administered Date(s) Administered  . Influenza, High Dose Seasonal PF 06/06/2014, 05/14/2016  . Influenza-Unspecified 06/13/2013, 05/02/2015  . Pneumococcal Conjugate-13 05/24/2014  . Pneumococcal Polysaccharide-23 01/08/2009  . Tdap 01/28/2012  . 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   Preventative care: Last colonoscopy: 2008 DUE  Last mammogram: 07/2016 Last pap smear/pelvic exam: 2016, declines another  DEXA: 02/2016 normal CXR 12/2012 MRI LUMBAR 2009  Prior vaccinations: TD or Tdap: 2013  Influenza: 2016 Pneumococcal: 2010 Prevnar13: 2015 Shingles/Zostavax: 2008  Names of Other Physician/Practitioners you currently use: 1. [facility]- here for primary care 2. [place], eye doctor, last visit July 2018 3. Dr. Arrie Aran dentist, last visit June 2018 Patient Care Team: Unk Pinto, MD as PCP - General (Internal Medicine) Lafayette Dragon, MD (Inactive) as Consulting Physician (Gastroenterology) Newton Pigg, MD as Consulting Physician (Obstetrics and Gynecology)  Allergies Allergies  Allergen Reactions  . Oxycodone-Acetaminophen Other (See Comments)    panic attack  . Vit C-Cholecalciferol-Rose Hip [Cholecalciferol-Vitamin C] Other (See Comments)    dyspepsia  . Ibuprofen [Ibuprofen] Rash  . Macrodantin [Nitrofurantoin Macrocrystal]  Rash    SURGICAL HISTORY She  has a past surgical history that includes right shoulder humerus fx (july 2003); right hand ulner nerve surgery (March 26, 2002); right elbow   surgery (March 26, 2002); right wrist plating (2004); right total hip arthroplasty (2008); right total knee arthroplasty (sept 2012); Tonsillectomy (age 72); and Total hip arthroplasty (10/22/2011). FAMILY HISTORY Her family history includes Diabetes in her father and mother; Heart disease in her father and mother;  Hypertension in her mother. SOCIAL HISTORY She  reports that she has never smoked. She has never used smokeless tobacco. She reports that she drinks alcohol. She reports that she does not use drugs.  MEDICARE WELLNESS OBJECTIVES: Physical activity: Current Exercise Habits: Structured exercise class, Type of exercise: strength training/weights, Time (Minutes): 40, Frequency (Times/Week): 3, Weekly Exercise (Minutes/Week): 120, Intensity: Mild Cardiac risk factors: Cardiac Risk Factors include: advanced age (>78men, >56 women);family history of premature cardiovascular disease;hypertension;dyslipidemia Depression/mood screen:   Depression screen [facility] 2/9 03/31/2017  Decreased Interest 0  Down, Depressed, Hopeless 0  PHQ - 2 Score 0    ADLs:  In your present state of health, do you have any difficulty performing the following activities: 03/31/2017 12/16/2016  Hearing? N N  Vision? N N  Difficulty concentrating or making decisions? N N  Walking or climbing stairs? N N  Dressing or bathing? N N  Doing errands, shopping? N N  Some recent data might be hidden    Cognitive Testing  Alert? Yes  Normal Appearance?Yes  Oriented to person? Yes  Place? Yes   Time? Yes  Recall of three objects?  Yes  Can perform simple calculations? Yes  Displays appropriate judgment?Yes  Can read the correct time from a watch face?Yes  EOL planning: Does Patient Have a Medical Advance Directive?: Yes Type of Advance Directive: Healthcare Power of Attorney, Living will Does patient want to make changes to medical advance directive?: No - Patient declined Copy of [place] in Chart?: Yes (scanned in 2012, latest one)   Objective:   Today's Vitals   03/31/17 0853  BP: 120/82  Pulse: 60  Resp: 14  Temp: (!) 97.5 F (36.4 C)  SpO2: 95%  Weight: 155 lb (70.3 kg)  Height: 5\' 3"  (1.6 m)  PainSc: 0-No pain   Body mass index is 27.46 kg/m.  General appearance: alert, no distress, WD/WN,   female HEENT: normocephalic, sclerae anicteric, TMs pearly, nares patent, no discharge or erythema, pharynx normal Oral cavity: MMM, no lesions Neck: supple, no lymphadenopathy, no thyromegaly, no masses Heart: RRR, normal S1, S2, no murmurs Lungs: CTA bilaterally, no wheezes, rhonchi, or rales Abdomen: +bs, soft, non tender, non distended, no masses, no hepatomegaly, no splenomegaly Musculoskeletal: nontender, no swelling, no obvious deformity Extremities: no edema, no cyanosis, no clubbing Pulses: 2+ symmetric, upper and lower extremities, normal cap refill Neurological: alert, oriented x 3, CN2-12 intact, strength normal upper extremities and lower extremities, sensation normal throughout, DTRs 2+ throughout, no cerebellar signs, gait normal Psychiatric: normal affect, behavior normal, pleasant  Breast: defer Gyn: defer Rectal: defer   Medicare Attestation I have personally reviewed: The patient's medical and social history Their use of alcohol, tobacco or illicit drugs Their current medications and supplements The patient's functional ability including ADLs,fall risks, home safety risks, cognitive, and hearing and visual impairment Diet and physical activities Evidence for depression or mood disorders  The patient's weight, height, BMI, and visual acuity have been recorded in the chart.  I have  made referrals, counseling, and provided education to the patient based on review of the above and I have provided the patient with a written personalized care plan for preventive services.     Vicie Mutters, PA-C   03/31/2017

## 2017-03-30 ENCOUNTER — Other Ambulatory Visit: Payer: Self-pay | Admitting: *Deleted

## 2017-03-30 DIAGNOSIS — Z1211 Encounter for screening for malignant neoplasm of colon: Secondary | ICD-10-CM

## 2017-03-30 LAB — POC HEMOCCULT BLD/STL (HOME/3-CARD/SCREEN)
Card #2 Fecal Occult Blod, POC: NEGATIVE
FECAL OCCULT BLD: NEGATIVE
FECAL OCCULT BLD: NEGATIVE

## 2017-03-31 ENCOUNTER — Encounter: Payer: Self-pay | Admitting: Physician Assistant

## 2017-03-31 ENCOUNTER — Ambulatory Visit (INDEPENDENT_AMBULATORY_CARE_PROVIDER_SITE_OTHER): Payer: PPO | Admitting: Physician Assistant

## 2017-03-31 VITALS — BP 120/82 | HR 60 | Temp 97.5°F | Resp 14 | Ht 63.0 in | Wt 155.0 lb

## 2017-03-31 DIAGNOSIS — H4010X0 Unspecified open-angle glaucoma, stage unspecified: Secondary | ICD-10-CM

## 2017-03-31 DIAGNOSIS — Z0000 Encounter for general adult medical examination without abnormal findings: Secondary | ICD-10-CM

## 2017-03-31 DIAGNOSIS — Z7189 Other specified counseling: Secondary | ICD-10-CM | POA: Diagnosis not present

## 2017-03-31 DIAGNOSIS — E782 Mixed hyperlipidemia: Secondary | ICD-10-CM

## 2017-03-31 DIAGNOSIS — I10 Essential (primary) hypertension: Secondary | ICD-10-CM

## 2017-03-31 DIAGNOSIS — M5412 Radiculopathy, cervical region: Secondary | ICD-10-CM

## 2017-03-31 DIAGNOSIS — Z0001 Encounter for general adult medical examination with abnormal findings: Secondary | ICD-10-CM | POA: Diagnosis not present

## 2017-03-31 DIAGNOSIS — R6889 Other general symptoms and signs: Secondary | ICD-10-CM

## 2017-03-31 DIAGNOSIS — E559 Vitamin D deficiency, unspecified: Secondary | ICD-10-CM

## 2017-03-31 DIAGNOSIS — F411 Generalized anxiety disorder: Secondary | ICD-10-CM | POA: Diagnosis not present

## 2017-03-31 DIAGNOSIS — Z79899 Other long term (current) drug therapy: Secondary | ICD-10-CM

## 2017-03-31 DIAGNOSIS — R7309 Other abnormal glucose: Secondary | ICD-10-CM | POA: Diagnosis not present

## 2017-03-31 LAB — CBC WITH DIFFERENTIAL/PLATELET
BASOS PCT: 1 %
Basophils Absolute: 60 cells/uL (ref 0–200)
EOS ABS: 180 {cells}/uL (ref 15–500)
EOS PCT: 3 %
HCT: 40 % (ref 35.0–45.0)
Hemoglobin: 13.2 g/dL (ref 11.7–15.5)
LYMPHS PCT: 38 %
Lymphs Abs: 2280 cells/uL (ref 850–3900)
MCH: 30.6 pg (ref 27.0–33.0)
MCHC: 33 g/dL (ref 32.0–36.0)
MCV: 92.6 fL (ref 80.0–100.0)
MONOS PCT: 10 %
MPV: 11.3 fL (ref 7.5–12.5)
Monocytes Absolute: 600 cells/uL (ref 200–950)
Neutro Abs: 2880 cells/uL (ref 1500–7800)
Neutrophils Relative %: 48 %
PLATELETS: 276 10*3/uL (ref 140–400)
RBC: 4.32 MIL/uL (ref 3.80–5.10)
RDW: 14.6 % (ref 11.0–15.0)
WBC: 6 10*3/uL (ref 3.8–10.8)

## 2017-03-31 LAB — TSH: TSH: 1.33 m[IU]/L

## 2017-03-31 NOTE — Patient Instructions (Addendum)
Drink 80-100 oz a day of water, measure it out Eat 3 meals a day, have to do breakfast, eat protein- hard boiled eggs, protein bar like nature valley protein bar, greek yogurt like oikos triple zero, chobani 100, or light n fit greek    HOME CARE INSTRUCTIONS   Do not stand or sit in one position for long periods of time. Do not sit with your legs crossed. Rest with your legs raised during the day.  Your legs have to be higher than your heart so that gravity will force the valves to open, so please really elevate your legs.   Wear elastic stockings or support hose. Do not wear other tight, encircling garments around the legs, pelvis, or waist.  [employer]  has a wide variety of well priced compression stockings. [place], [place] [address] [phone]  Can get from [place] as well for very cheap  Walk as much as possible to increase blood flow.  Raise the foot of your bed at night with 2-inch blocks. SEEK MEDICAL CARE IF:   The skin around your ankle starts to break down.  You have pain, redness, tenderness, or hard swelling developing in your leg over a vein.  You are uncomfortable due to leg pain. Document Released: 05/27/2005 Document Revised: 11/09/2011 Document Reviewed: 10/13/2010 [facility] Patient Information 2014 [place].     Bad carbs also include fruit juice, alcohol, and sweet tea. These are empty calories that do not signal to your brain that you are full.   Please remember the good carbs are still carbs which convert into sugar. So please measure them out no more than 1/2-1 cup of rice, oatmeal, pasta, and beans  Veggies are however free foods! Pile them on.   Not all fruit is created equal. Please see the list below, the fruit at the bottom is higher in sugars than the fruit at the top. Please avoid all dried fruits.     Cologuard is an easy to use noninvasive colon cancer screening test based on the latest advances in stool DNA  science.   Colon cancer is 3rd most diagnosed cancer and 2nd leading cause of death in both men and women 4 years of age and older despite being one of the most preventable and treatable cancers if found early.  4 of out 5 people diagnosed with colon cancer have NO prior family history.  When caught EARLY 90% of colon cancer is curable.   You have agreed to do a Cologuard screening and have declined a colonoscopy in spite of being explained the risks and benefits of the colonoscopy in detail, including cancer and death. Please understand that this is test not as sensitive or specific as a colonoscopy and you are still recommended to get a colonoscopy.   If you are NOT medicare please call your insurance company and give them these items to see if they will cover it: 1) CPT code, [phone] 2) Provider is Probation officer 3) [employer] NPI [phone] 4) [employer] Tax ID [id number]  Out-of-pocket cost for Cologuard can range from $0 - $649 so please call  You will receive a short call from [place] support center at [employer], when you receive a call they will say they are from [place],  to confirm your mailing address and give you more information.  When they calll you, it will appear on the caller ID as "[employer]" or in some cases only  this number will appear, [phone].   [employer] will ship your collection kit directly to you. You will collect a single stool sample in the privacy of your own home, no special preparation required. You will return the kit via [place] pre-paid shipping or pick-up, in the same box it arrived in. Then I will contact you to discuss your results after I receive them from the laboratory.   If you have any questions or concerns, Cologuard Customer Support Specialist are available 24 hours a day, 7 days a week at [phone] or go to [email or web address].    CALL FOR COLONOSCOPY  Phone:  [phone]

## 2017-04-01 LAB — BASIC METABOLIC PANEL WITH GFR
BUN: 29 mg/dL — ABNORMAL HIGH (ref 7–25)
CALCIUM: 8.8 mg/dL (ref 8.6–10.4)
CO2: 23 mmol/L (ref 20–31)
CREATININE: 0.9 mg/dL (ref 0.60–0.93)
Chloride: 105 mmol/L (ref 98–110)
GFR, Est African American: 72 mL/min (ref >=60)
GFR, Est Non African American: 63 mL/min (ref >=60)
Glucose, Bld: 86 mg/dL (ref 65–99)
POTASSIUM: 4.2 mmol/L (ref 3.5–5.3)
SODIUM: 140 mmol/L (ref 135–146)

## 2017-04-01 LAB — HEPATIC FUNCTION PANEL
ALT: 24 U/L (ref 6–29)
AST: 26 U/L (ref 10–35)
Albumin: 3.8 g/dL (ref 3.6–5.1)
Alkaline Phosphatase: 87 U/L (ref 33–130)
BILIRUBIN DIRECT: 0.1 mg/dL (ref <=0.2)
Indirect Bilirubin: 0.4 mg/dL (ref 0.2–1.2)
TOTAL PROTEIN: 6.3 g/dL (ref 6.1–8.1)
Total Bilirubin: 0.5 mg/dL (ref 0.2–1.2)

## 2017-04-01 LAB — LIPID PANEL
CHOL/HDL RATIO: 3 ratio (ref <5.0)
CHOLESTEROL: 222 mg/dL — AB (ref <200)
HDL: 74 mg/dL (ref >50)
LDL Cholesterol: 137 mg/dL — ABNORMAL HIGH (ref <100)
Triglycerides: 55 mg/dL (ref <150)
VLDL: 11 mg/dL (ref <30)

## 2017-04-01 LAB — MAGNESIUM: Magnesium: 1.9 mg/dL (ref 1.5–2.5)

## 2017-04-07 ENCOUNTER — Other Ambulatory Visit: Payer: Self-pay | Admitting: Internal Medicine

## 2017-04-07 DIAGNOSIS — Z1211 Encounter for screening for malignant neoplasm of colon: Secondary | ICD-10-CM | POA: Diagnosis not present

## 2017-04-07 DIAGNOSIS — Z1212 Encounter for screening for malignant neoplasm of rectum: Secondary | ICD-10-CM | POA: Diagnosis not present

## 2017-04-07 NOTE — Telephone Encounter (Signed)
Please call Lyrica

## 2017-04-14 ENCOUNTER — Other Ambulatory Visit: Payer: Self-pay | Admitting: Physician Assistant

## 2017-04-14 ENCOUNTER — Encounter: Payer: Self-pay | Admitting: Internal Medicine

## 2017-04-14 ENCOUNTER — Encounter: Payer: Self-pay | Admitting: Physician Assistant

## 2017-04-14 DIAGNOSIS — R195 Other fecal abnormalities: Secondary | ICD-10-CM | POA: Insufficient documentation

## 2017-04-14 HISTORY — DX: Other fecal abnormalities: R19.5

## 2017-04-14 LAB — COLOGUARD: COLOGUARD: POSITIVE

## 2017-04-15 ENCOUNTER — Encounter: Payer: Self-pay | Admitting: Gastroenterology

## 2017-04-15 ENCOUNTER — Encounter: Payer: Self-pay | Admitting: Physician Assistant

## 2017-04-30 ENCOUNTER — Other Ambulatory Visit: Payer: Self-pay | Admitting: Internal Medicine

## 2017-05-06 DIAGNOSIS — H401212 Low-tension glaucoma, right eye, moderate stage: Secondary | ICD-10-CM | POA: Diagnosis not present

## 2017-05-06 DIAGNOSIS — H04123 Dry eye syndrome of bilateral lacrimal glands: Secondary | ICD-10-CM | POA: Diagnosis not present

## 2017-05-06 DIAGNOSIS — H401221 Low-tension glaucoma, left eye, mild stage: Secondary | ICD-10-CM | POA: Diagnosis not present

## 2017-06-01 ENCOUNTER — Ambulatory Visit (AMBULATORY_SURGERY_CENTER): Payer: Self-pay | Admitting: *Deleted

## 2017-06-01 ENCOUNTER — Encounter: Payer: Self-pay | Admitting: Gastroenterology

## 2017-06-01 VITALS — Ht 64.0 in | Wt 157.6 lb

## 2017-06-01 DIAGNOSIS — R195 Other fecal abnormalities: Secondary | ICD-10-CM

## 2017-06-01 MED ORDER — NA SULFATE-K SULFATE-MG SULF 17.5-3.13-1.6 GM/177ML PO SOLN: 1.0000 | Freq: Once | ORAL | 0 refills | Status: AC

## 2017-06-01 NOTE — Progress Notes (Signed)
No egg or soy allergy known to patient  No issues with past sedation with any surgeries  or procedures, no intubation problems  No diet pills per patient No home 02 use per patient  No blood thinners per patient  Pt denies issues with constipation  No A fib or A flutter  EMMI video sent to pt's e mail - pt declined emmi  PNM than $50 coupon to pt in PV today

## 2017-06-14 ENCOUNTER — Encounter: Payer: Self-pay | Admitting: Internal Medicine

## 2017-06-15 ENCOUNTER — Ambulatory Visit (AMBULATORY_SURGERY_CENTER): Payer: PPO | Admitting: Gastroenterology

## 2017-06-15 ENCOUNTER — Encounter: Payer: Self-pay | Admitting: Gastroenterology

## 2017-06-15 VITALS — BP 124/52 | HR 56 | Temp 97.5°F | Resp 12 | Ht 64.0 in | Wt 157.6 lb

## 2017-06-15 DIAGNOSIS — K6389 Other specified diseases of intestine: Secondary | ICD-10-CM

## 2017-06-15 DIAGNOSIS — R195 Other fecal abnormalities: Secondary | ICD-10-CM

## 2017-06-15 DIAGNOSIS — D120 Benign neoplasm of cecum: Secondary | ICD-10-CM

## 2017-06-15 DIAGNOSIS — D128 Benign neoplasm of rectum: Secondary | ICD-10-CM | POA: Diagnosis not present

## 2017-06-15 DIAGNOSIS — D123 Benign neoplasm of transverse colon: Secondary | ICD-10-CM

## 2017-06-15 MED ORDER — SODIUM CHLORIDE 0.9 % IV SOLN: 500.0000 mL | INTRAVENOUS | Status: DC

## 2017-06-15 NOTE — Progress Notes (Signed)
Spontaneous respirations throughout. VSS. Resting comfortably. To PACU on room air. Report to  RN. 

## 2017-06-15 NOTE — Op Note (Signed)
[facility] Patient Name: Gwendolyn [name] Procedure Date: 06/15/2017 11:36 AM MRN: [id number] Endoscopist: Remo Lipps P. Ammie Warrick MD, MD Age: 76 Referring MD:  Date of Birth: Oct 02, 1940 Gender: Female Account #: [id number] Procedure:                Colonoscopy Indications:              Positive Cologuard test Medicines:                Monitored Anesthesia Care Procedure:                Pre-Anesthesia Assessment:                           - Prior to the procedure, a History and Physical                            was performed, and patient medications and                            allergies were reviewed. The patient's tolerance of                            previous anesthesia was also reviewed. The risks                            and benefits of the procedure and the sedation                            options and risks were discussed with the patient.                            All questions were answered, and informed consent                            was obtained. Prior Anticoagulants: The patient has                            taken no previous anticoagulant or antiplatelet                            agents. ASA Grade Assessment: II - A patient with                            mild systemic disease. After reviewing the risks                            and benefits, the patient was deemed in                            satisfactory condition to undergo the procedure.                           After obtaining informed consent, the colonoscope  was passed under direct vision. Throughout the                            procedure, the patient's blood pressure, pulse, and                            oxygen saturations were monitored continuously. The                            Colonoscope was introduced through the anus and                            advanced to the the cecum, identified by                            appendiceal orifice and ileocecal  valve. The                            colonoscopy was performed without difficulty. The                            patient tolerated the procedure well. The quality                            of the bowel preparation was adequate. The                            ileocecal valve, appendiceal orifice, and rectum                            were photographed. Scope In: 11:41:55 AM Scope Out: 12:06:00 PM Scope Withdrawal Time: 0 hours 20 minutes 50 seconds  Total Procedure Duration: 0 hours 24 minutes 5 seconds  Findings:                 The perianal and digital rectal examinations were                            normal.                           A 3 mm polyp was found in the cecum. The polyp was                            sessile. The polyp was removed with a cold snare.                            Resection and retrieval were complete.                           A 3 mm polyp was found in the hepatic flexure. The                            polyp was sessile. The polyp was removed with a  cold snare. Resection and retrieval were complete.                           A 3 mm polyp was found in the transverse colon. The                            polyp was sessile. The polyp was removed with a                            cold snare. Resection and retrieval were complete.                           A few small-mouthed diverticula were found in the                            sigmoid colon.                           The exam was otherwise without abnormality. Complications:            No immediate complications. Estimated blood loss:                            Minimal. Estimated Blood Loss:     Estimated blood loss was minimal. Impression:               - One 3 mm polyp in the cecum, removed with a cold                            snare. Resected and retrieved.                           - One 3 mm polyp at the hepatic flexure, removed                            with a cold snare.  Resected and retrieved.                           - One 3 mm polyp in the transverse colon, removed                            with a cold snare. Resected and retrieved.                           - Diverticulosis in the sigmoid colon.                           - The examination was otherwise normal. Recommendation:           - Patient has a contact number available for                            emergencies. The signs and symptoms of potential  delayed complications were discussed with the                            patient. Return to normal activities tomorrow.                            Written discharge instructions were provided to the                            patient.                           - Resume previous diet.                           - Continue present medications.                           - Await pathology results.                           - Repeat colonoscopy is recommended for                            surveillance. The colonoscopy date will be                            determined after pathology results from today's                            exam become available for review. Remo Lipps P. Tristian Bouska MD, MD 06/15/2017 12:09:52 PM This report has been signed electronically.

## 2017-06-15 NOTE — Progress Notes (Signed)
Pt's states no medical or surgical changes since previsit or office visit. maw 

## 2017-06-15 NOTE — Progress Notes (Signed)
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 She requested to have her IV on the left arm.  IV in left a/c.  maw

## 2017-06-15 NOTE — Patient Instructions (Signed)
Discharge instructions given. Handouts on polyps and diverticulosis. Resume previous medications. YOU HAD AN ENDOSCOPIC PROCEDURE TODAY AT THE [facility]:   Refer to the procedure report that was given to you for any specific questions about what was found during the examination.  If the procedure report does not answer your questions, please call your gastroenterologist to clarify.  If you requested that your care partner not be given the details of your procedure findings, then the procedure report has been included in a sealed envelope for you to review at your convenience later.  YOU SHOULD EXPECT: Some feelings of bloating in the abdomen. Passage of more gas than usual.  Walking can help get rid of the air that was put into your GI tract during the procedure and reduce the bloating. If you had a lower endoscopy (such as a colonoscopy or flexible sigmoidoscopy) you may notice spotting of blood in your stool or on the toilet paper. If you underwent a bowel prep for your procedure, you may not have a normal bowel movement for a few days.  Please Note:  You might notice some irritation and congestion in your nose or some drainage.  This is from the oxygen used during your procedure.  There is no need for concern and it should clear up in a day or so.  SYMPTOMS TO REPORT IMMEDIATELY:   Following lower endoscopy (colonoscopy or flexible sigmoidoscopy):  Excessive amounts of blood in the stool  Significant tenderness or worsening of abdominal pains  Swelling of the abdomen that is new, acute  Fever of 100F or higher   For urgent or emergent issues, a gastroenterologist can be reached at any hour by calling [phone].   DIET:  We do recommend a small meal at first, but then you may proceed to your regular diet.  Drink plenty of fluids but you should avoid alcoholic beverages for 24 hours.  ACTIVITY:  You should plan to take it easy for the rest of today and you should NOT  DRIVE or use heavy machinery until tomorrow (because of the sedation medicines used during the test).    FOLLOW UP: Our staff will call the number listed on your records the next business day following your procedure to check on you and address any questions or concerns that you may have regarding the information given to you following your procedure. If we do not reach you, we will leave a message.  However, if you are feeling well and you are not experiencing any problems, there is no need to return our call.  We will assume that you have returned to your regular daily activities without incident.  If any biopsies were taken you will be contacted by phone or by letter within the next 1-3 weeks.  Please call us at [phone] if you have not heard about the biopsies in 3 weeks.    SIGNATURES/CONFIDENTIALITY: You and/or your care partner have signed paperwork which will be entered into your electronic medical record.  These signatures attest to the fact that that the information above on your After Visit Summary has been reviewed and is understood.  Full responsibility of the confidentiality of this discharge information lies with you and/or your care-partner. 

## 2017-06-15 NOTE — Progress Notes (Signed)
Called to room to assist during endoscopic procedure.  Patient ID and intended procedure confirmed with present staff. Received instructions for my participation in the procedure from the performing physician.  

## 2017-06-16 ENCOUNTER — Telehealth: Payer: Self-pay | Admitting: *Deleted

## 2017-06-16 NOTE — Telephone Encounter (Signed)
  Follow up Call-  Call back number 06/15/2017  Post procedure Call Back phone  # [phone] cell  Some recent data might be hidden     Patient questions:  Do you have a fever, pain , or abdominal swelling? No. Pain Score  0 *  Have you tolerated food without any problems? Yes.    Have you been able to return to your normal activities? Yes.    Do you have any questions about your discharge instructions: Diet   No. Medications  No. Follow up visit  No.  Do you have questions or concerns about your Care? No.  Actions: * If pain score is 4 or above: No action needed, pain <4.

## 2017-06-21 ENCOUNTER — Encounter: Payer: Self-pay | Admitting: Gastroenterology

## 2017-06-25 ENCOUNTER — Encounter: Payer: Self-pay | Admitting: Physician Assistant

## 2017-07-05 ENCOUNTER — Other Ambulatory Visit: Payer: Self-pay | Admitting: Internal Medicine

## 2017-07-05 DIAGNOSIS — Z1231 Encounter for screening mammogram for malignant neoplasm of breast: Secondary | ICD-10-CM

## 2017-07-13 NOTE — Patient Instructions (Signed)
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
## 2017-07-14 ENCOUNTER — Encounter: Payer: Self-pay | Admitting: Internal Medicine

## 2017-07-14 ENCOUNTER — Ambulatory Visit: Payer: PPO | Admitting: Internal Medicine

## 2017-07-14 VITALS — BP 120/82 | HR 74 | Resp 14 | Ht 62.0 in | Wt 153.6 lb

## 2017-07-14 DIAGNOSIS — I10 Essential (primary) hypertension: Secondary | ICD-10-CM | POA: Diagnosis not present

## 2017-07-14 DIAGNOSIS — E782 Mixed hyperlipidemia: Secondary | ICD-10-CM

## 2017-07-14 DIAGNOSIS — Z0000 Encounter for general adult medical examination without abnormal findings: Secondary | ICD-10-CM | POA: Diagnosis not present

## 2017-07-14 DIAGNOSIS — Z136 Encounter for screening for cardiovascular disorders: Secondary | ICD-10-CM

## 2017-07-14 DIAGNOSIS — G90511 Complex regional pain syndrome I of right upper limb: Secondary | ICD-10-CM

## 2017-07-14 DIAGNOSIS — E559 Vitamin D deficiency, unspecified: Secondary | ICD-10-CM | POA: Diagnosis not present

## 2017-07-14 DIAGNOSIS — R7303 Prediabetes: Secondary | ICD-10-CM | POA: Diagnosis not present

## 2017-07-14 DIAGNOSIS — Z0001 Encounter for general adult medical examination with abnormal findings: Secondary | ICD-10-CM

## 2017-07-14 DIAGNOSIS — Z79899 Other long term (current) drug therapy: Secondary | ICD-10-CM

## 2017-07-15 ENCOUNTER — Other Ambulatory Visit: Payer: Self-pay | Admitting: Internal Medicine

## 2017-07-15 LAB — LIPID PANEL
Cholesterol: 208 mg/dL — ABNORMAL HIGH (ref <200)
HDL: 76 mg/dL (ref >50)
LDL Cholesterol (Calc): 116 mg/dL (calc) — ABNORMAL HIGH
Non-HDL Cholesterol (Calc): 132 mg/dL (calc) — ABNORMAL HIGH (ref <130)
TRIGLYCERIDES: 67 mg/dL (ref <150)
Total CHOL/HDL Ratio: 2.7 (calc) (ref <5.0)

## 2017-07-15 LAB — CBC WITH DIFFERENTIAL/PLATELET
Basophils Absolute: 43 cells/uL (ref 0–200)
Basophils Relative: 0.7 %
EOS PCT: 2.3 %
Eosinophils Absolute: 140 cells/uL (ref 15–500)
HCT: 36.7 % (ref 35.0–45.0)
Hemoglobin: 12.4 g/dL (ref 11.7–15.5)
Lymphs Abs: 1556 cells/uL (ref 850–3900)
MCH: 30.7 pg (ref 27.0–33.0)
MCHC: 33.8 g/dL (ref 32.0–36.0)
MCV: 90.8 fL (ref 80.0–100.0)
MONOS PCT: 10.4 %
MPV: 11.8 fL (ref 7.5–12.5)
NEUTROS PCT: 61.1 %
Neutro Abs: 3727 cells/uL (ref 1500–7800)
PLATELETS: 290 10*3/uL (ref 140–400)
RBC: 4.04 10*6/uL (ref 3.80–5.10)
RDW: 13 % (ref 11.0–15.0)
TOTAL LYMPHOCYTE: 25.5 %
WBC mixed population: 634 cells/uL (ref 200–950)
WBC: 6.1 10*3/uL (ref 3.8–10.8)

## 2017-07-15 LAB — HEMOGLOBIN A1C
HEMOGLOBIN A1C: 5.4 %{Hb} (ref <5.7)
Mean Plasma Glucose: 108 (calc)
eAG (mmol/L): 6 (calc)

## 2017-07-15 LAB — BASIC METABOLIC PANEL WITH GFR
BUN: 22 mg/dL (ref 7–25)
CALCIUM: 8.9 mg/dL (ref 8.6–10.4)
CHLORIDE: 105 mmol/L (ref 98–110)
CO2: 29 mmol/L (ref 20–32)
Creat: 0.82 mg/dL (ref 0.60–0.93)
GFR, Est African American: 81 mL/min/{1.73_m2} (ref >=60)
GFR, Est Non African American: 70 mL/min/{1.73_m2} (ref >=60)
Glucose, Bld: 84 mg/dL (ref 65–99)
POTASSIUM: 4.4 mmol/L (ref 3.5–5.3)
Sodium: 141 mmol/L (ref 135–146)

## 2017-07-15 LAB — MICROALBUMIN / CREATININE URINE RATIO
Creatinine, Urine: 51 mg/dL (ref 20–275)
Microalb, Ur: <0.2 mg/dL

## 2017-07-15 LAB — HEPATIC FUNCTION PANEL
AG Ratio: 1.6 (calc) (ref 1.0–2.5)
ALBUMIN MSPROF: 4.1 g/dL (ref 3.6–5.1)
ALKALINE PHOSPHATASE (APISO): 69 U/L (ref 33–130)
ALT: 24 U/L (ref 6–29)
AST: 28 U/L (ref 10–35)
BILIRUBIN DIRECT: 0.1 mg/dL (ref 0.0–0.2)
Globulin: 2.6 g/dL (calc) (ref 1.9–3.7)
Indirect Bilirubin: 0.4 mg/dL (calc) (ref 0.2–1.2)
Total Bilirubin: 0.5 mg/dL (ref 0.2–1.2)
Total Protein: 6.7 g/dL (ref 6.1–8.1)

## 2017-07-15 LAB — VITAMIN D 25 HYDROXY (VIT D DEFICIENCY, FRACTURES): VIT D 25 HYDROXY: 74 ng/mL (ref 30–100)

## 2017-07-15 LAB — TSH: TSH: 1.31 m[IU]/L (ref 0.40–4.50)

## 2017-07-15 LAB — URINALYSIS, ROUTINE W REFLEX MICROSCOPIC
BACTERIA UA: NONE SEEN /HPF
BILIRUBIN URINE: NEGATIVE
GLUCOSE, UA: NEGATIVE
HGB URINE DIPSTICK: NEGATIVE
Hyaline Cast: NONE SEEN /LPF
KETONES UR: NEGATIVE
NITRITE: NEGATIVE
PH: 6 (ref 5.0–8.0)
Protein, ur: NEGATIVE
RBC / HPF: NONE SEEN /HPF (ref 0–2)
SPECIFIC GRAVITY, URINE: 1.013 (ref 1.001–1.03)
WBC UA: NONE SEEN /HPF (ref 0–5)

## 2017-07-15 LAB — INSULIN, RANDOM: INSULIN: 2.4 u[IU]/mL (ref 2.0–19.6)

## 2017-07-15 LAB — MAGNESIUM: MAGNESIUM: 2 mg/dL (ref 1.5–2.5)

## 2017-07-15 MED ORDER — ROSUVASTATIN CALCIUM 40 MG PO TABS: ORAL_TABLET | ORAL | 5 refills | Status: DC

## 2017-08-05 DIAGNOSIS — H04123 Dry eye syndrome of bilateral lacrimal glands: Secondary | ICD-10-CM | POA: Diagnosis not present

## 2017-08-05 DIAGNOSIS — H401232 Low-tension glaucoma, bilateral, moderate stage: Secondary | ICD-10-CM | POA: Diagnosis not present

## 2017-08-05 DIAGNOSIS — H16103 Unspecified superficial keratitis, bilateral: Secondary | ICD-10-CM | POA: Diagnosis not present

## 2017-08-05 DIAGNOSIS — Z961 Presence of intraocular lens: Secondary | ICD-10-CM | POA: Diagnosis not present

## 2017-08-11 ENCOUNTER — Ambulatory Visit
Admission: RE | Admit: 2017-08-11 | Discharge: 2017-08-11 | Disposition: A | Discharge status: Home / Self Care | Payer: PPO | Source: Ambulatory Visit | Attending: Internal Medicine | Admitting: Internal Medicine

## 2017-08-11 DIAGNOSIS — Z1231 Encounter for screening mammogram for malignant neoplasm of breast: Secondary | ICD-10-CM

## 2017-09-06 ENCOUNTER — Other Ambulatory Visit: Payer: Self-pay | Admitting: Internal Medicine

## 2017-09-06 MED ORDER — MONTELUKAST SODIUM 10 MG PO TABS: ORAL_TABLET | ORAL | 3 refills | Status: DC

## 2017-09-27 ENCOUNTER — Encounter: Payer: Self-pay | Admitting: Physician Assistant

## 2017-09-27 ENCOUNTER — Ambulatory Visit (INDEPENDENT_AMBULATORY_CARE_PROVIDER_SITE_OTHER): Payer: PPO | Admitting: Physician Assistant

## 2017-09-27 VITALS — BP 134/76 | HR 70 | Temp 97.4°F | Resp 16 | Ht 62.0 in | Wt 156.6 lb

## 2017-09-27 DIAGNOSIS — J0100 Acute maxillary sinusitis, unspecified: Secondary | ICD-10-CM

## 2017-09-27 DIAGNOSIS — I10 Essential (primary) hypertension: Secondary | ICD-10-CM | POA: Diagnosis not present

## 2017-09-27 DIAGNOSIS — F411 Generalized anxiety disorder: Secondary | ICD-10-CM | POA: Diagnosis not present

## 2017-09-27 DIAGNOSIS — M47812 Spondylosis without myelopathy or radiculopathy, cervical region: Secondary | ICD-10-CM | POA: Insufficient documentation

## 2017-09-27 DIAGNOSIS — Z0001 Encounter for general adult medical examination with abnormal findings: Secondary | ICD-10-CM | POA: Diagnosis not present

## 2017-09-27 DIAGNOSIS — H4010X0 Unspecified open-angle glaucoma, stage unspecified: Secondary | ICD-10-CM | POA: Diagnosis not present

## 2017-09-27 DIAGNOSIS — Z6828 Body mass index (BMI) 28.0-28.9, adult: Secondary | ICD-10-CM | POA: Diagnosis not present

## 2017-09-27 DIAGNOSIS — Z79899 Other long term (current) drug therapy: Secondary | ICD-10-CM

## 2017-09-27 DIAGNOSIS — E782 Mixed hyperlipidemia: Secondary | ICD-10-CM

## 2017-09-27 DIAGNOSIS — E559 Vitamin D deficiency, unspecified: Secondary | ICD-10-CM | POA: Diagnosis not present

## 2017-09-27 DIAGNOSIS — R7309 Other abnormal glucose: Secondary | ICD-10-CM

## 2017-09-27 DIAGNOSIS — R6889 Other general symptoms and signs: Secondary | ICD-10-CM | POA: Diagnosis not present

## 2017-09-27 DIAGNOSIS — Z0000 Encounter for general adult medical examination without abnormal findings: Secondary | ICD-10-CM

## 2017-09-27 MED ORDER — PREDNISONE 20 MG PO TABS: ORAL_TABLET | ORAL | 0 refills | Status: DC

## 2017-09-27 MED ORDER — AZITHROMYCIN 250 MG PO TABS: ORAL_TABLET | ORAL | 1 refills | Status: AC

## 2017-09-27 NOTE — Patient Instructions (Signed)
Make sure you are on an allergy pill, see below for more details. Please take the prednisone as directed below, this is NOT an antibiotic so you do NOT have to finish it. You can take it for a few days and stop it if you are doing better.   Please take the prednisone to help decrease inflammation and therefore decrease symptoms. Take it it with food to avoid GI upset. It can cause increased energy but on the other hand it can make it hard to sleep at night so please take it AT NIGHT WITH DINNER, it takes 8-12 hours to start working so it will NOT affect your sleeping if you take it at night with your food!!  If you are diabetic it will increase your sugars so decrease carbs and monitor your sugars closely.     HOW TO TREAT VIRAL COUGH AND COLD SYMPTOMS:  -Symptoms usually last at least 1 week with the worst symptoms being around day 4.  - colds usually start with a sore throat and end with a cough, and the cough can take 2 weeks to get better.  -No antibiotics are needed for colds, flu, sore throats, cough, bronchitis UNLESS symptoms are longer than 7 days OR if you are getting better then get drastically worse.  -There are a lot of combination medications (Dayquil, Nyquil, Vicks 44, tyelnol cold and sinus, ETC). Please look at the ingredients on the back so that you are treating the correct symptoms and not doubling up on medications/ingredients.    Medicines you can use  Nasal congestion  Little Remedies saline spray (aerosol/mist)- can try this, it is in the kids section - pseudoephedrine (Sudafed)- behind the counter, do not use if you have high blood pressure, medicine that have -D in them.  - phenylephrine (Sudafed PE) -Dextormethorphan + chlorpheniramine (Coridcidin HBP)- okay if you have high blood pressure -Oxymetazoline (Afrin) nasal spray- LIMIT to 3 days -Saline nasal spray -Neti pot (used distilled or bottled water)  Ear pain/congestion  -pseudoephedrine (sudafed) -  Nasonex/flonase nasal spray  Fever  -Acetaminophen (Tyelnol) -Ibuprofen (Advil, motrin, aleve)  Sore Throat  -Acetaminophen (Tyelnol) -Ibuprofen (Advil, motrin, aleve) -Drink a lot of water -Gargle with salt water - Rest your voice (don't talk) -Throat sprays -Cough drops  Body Aches  -Acetaminophen (Tyelnol) -Ibuprofen (Advil, motrin, aleve)  Headache  -Acetaminophen (Tyelnol) -Ibuprofen (Advil, motrin, aleve) - Exedrin, Exedrin Migraine  Allergy symptoms (cough, sneeze, runny nose, itchy eyes) -Claritin or loratadine cheapest but likely the weakest  -Zyrtec or certizine at night because it can make you sleepy -The strongest is allegra or fexafinadine  Cheapest at [employer], [employer], [employer]  Cough  -Dextromethorphan (Delsym)- medicine that has DM in it -Guafenesin (Mucinex/Robitussin) - cough drops - drink lots of water  Chest Congestion  -Guafenesin (Mucinex/Robitussin)  Red Itchy Eyes  - Naphcon-A  Upset Stomach  - Bland diet (nothing spicy, greasy, fried, and high acid foods like tomatoes, oranges, berries) -OKAY- cereal, bread, soup, crackers, rice -Eat smaller more frequent meals -reduce caffeine, no alcohol -Loperamide (Imodium-AD) if diarrhea -Prevacid for heart burn  General health when sick  -Hydration -wash your hands frequently -keep surfaces clean -change pillow cases and sheets often -Get fresh air but do not exercise strenuously -Vitamin D, double up on it - Vitamin C -Zinc       

## 2017-09-27 NOTE — Progress Notes (Signed)
MEDICARE ANNUAL WELLNESS VISIT AND FOLLOW UP  Assessment:   Essential hypertension - continue medications, DASH diet, exercise and monitor at home. Call if greater than 130/80.  -     CBC with Differential/Platelet -     BASIC METABOLIC PANEL WITH GFR -     Hepatic function panel -     TSH  Hyperlipidemia -continue medications, check lipids, decrease fatty foods, increase activity.  -     Lipid panel  Abnormal glucose Discussed general issues about diabetes pathophysiology and management., Educational material distributed., Suggested low cholesterol diet., Encouraged aerobic exercise., Discussed foot care., Reminded to get yearly retinal exam.  Medication management -     Magnesium  Encounter for Medicare annual wellness exam 1 year  Morbid Obesity with co morbidities - long discussion about weight loss, diet, and exercise  Generalized anxiety disorder continue medications, stress management techniques discussed, increase water, good sleep hygiene discussed, increase exercise, and increase veggies.   Cervical radicular pain Continue ortho follow up  Open-angle glaucoma, unspecified glaucoma stage, unspecified laterality, unspecified open-angle glaucoma type Continue eye doctor follow up  Vitamin D deficiency Continue supplement  Sinusitis Likely vasovagal fall If any other symptoms go to ER Prednisone/zpak  Over 30 minutes of exam, counseling, chart review, and critical decision making was performed Future Appointments  Date Time Provider [place]  10/20/2017  9:00 AM Liane Comber, NP [facility] None  01/26/2018  9:30 AM Unk Pinto, MD [facility] None     Plan:   During the course of the visit the patient was educated and counseled about appropriate screening and preventive services including:    Pneumococcal vaccine   Influenza vaccine  Td vaccine  Prevnar 13  Screening electrocardiogram  Screening mammography  Bone densitometry  screening  Colorectal cancer screening  Diabetes screening  Glaucoma screening  Nutrition counseling   Advanced directives: given info/requested copies   Subjective:   Gwendolyn [name] is a 77 y.o. female who presents for Medicare Annual Wellness Visit and 3 month follow up on hypertension, prediabetes, hyperlipidemia, vitamin D def.  She has had URI sympoms x 1 week, she has had sinus congestion, cough nonproductive, wheezing, Sunday morning she took tussinex DM and mucines and went back to bed, she got up to urinate at 5 AM and she felt she was going to faint, she does not remember anything else, she woke up between the toilet and the wall. Unknown how long she was gone down for, has slight bruising on right face and back. She denies post ictal periods, no CP, SOB prior, was after micturition.   No fever, chills, muscles aches.   Her blood pressure has been controlled at home, today their BP is BP: 134/76  She does workout, goes to [employer] 3 days a week. She denies chest pain, shortness of breath, dizziness. She takes lasix AS needed for swelling but has severe leg cramps that night in her ankles, takes the kdur 1meq with it but still with pain.  She has  She is on estrogen and progesterone, she is on bASA, only on half.   She is on cholesterol medication and has myalgias with crestor on the days she takes it. Her cholesterol is at goal. The cholesterol last visit was:   Lab Results  Component Value Date   CHOL 208 (H) 07/14/2017   HDL 76 07/14/2017   LDLCALC 137 (H) 03/31/2017   TRIG 67 07/14/2017   CHOLHDL 2.7 07/14/2017    She has  been working on diet and exercise for prediabetes, and denies paresthesia of the feet, polydipsia, polyuria and visual disturbances. Last A1C in the office was:  Lab Results  Component Value Date   HGBA1C 5.4 07/14/2017   Patient is on Vitamin D supplement.   Lab Results  Component Value Date   VD25OH 74 07/14/2017     BMI is Body  mass index is 28.64 kg/m., she is working on diet and exercise, she is doing [employer]. Wt Readings from Last 3 Encounters:  09/27/17 156 lb 9.6 oz (71 kg)  07/14/17 153 lb 9.6 oz (69.7 kg)  06/15/17 157 lb 9.6 oz (71.5 kg)    Medication Review Current Outpatient Medications on File Prior to Visit  Medication Sig  . ALPRAZolam (XANAX) 0.5 MG tablet TAKE ONE-HALF TO ONE TABLET BY MOUTH THREE TIMES DAILY AS NEEDED FOR ANXIETY  . aspirin EC 81 MG tablet Take 162 mg by mouth at bedtime.  . Cholecalciferol (VITAMIN D) 2000 units CAPS Take 1 capsule by mouth daily.  Marland Kitchen estradiol (ESTRACE) 1 MG tablet TAKE 1 TABLET EVERY DAY  . furosemide (LASIX) 80 MG tablet TAKE ONE TABLET BY MOUTH ONCE DAILY (Patient taking differently: TAKE ONE  half TABLET BY MOUTH ONCE DAILY)  . losartan-hydrochlorothiazide (HYZAAR) 50-12.5 MG tablet TAKE 1 TABLET BY MOUTH EVERY DAY  . LYRICA 150 MG capsule TAKE ONE CAPSULE BY MOUTH THREE TIMES DAILY  . medroxyPROGESTERone (PROVERA) 2.5 MG tablet TAKE 1 TABLET EVERY DAY  . montelukast (SINGULAIR) 10 MG tablet Take 1 tablet daily for Allergies  . Naproxen Sodium (ALEVE) 220 MG CAPS Take by mouth.  . Omega-3 Fatty Acids (FISH OIL) 1000 MG CAPS Take by mouth 2 (two) times daily.  Marland Kitchen pyridOXINE (VITAMIN B-6) 100 MG tablet Take 100 mg by mouth daily.  . rosuvastatin (CRESTOR) 40 MG tablet Take 1/2 to 1 tablet daily or as directed for Cholesterol  . timolol (TIMOPTIC) 0.5 % ophthalmic solution    No current facility-administered medications on file prior to visit.     Current Problems (verified) Patient Active Problem List   Diagnosis Date Noted  . Cervical arthritis 09/27/2017  . Positive colorectal cancer screening using Cologuard test 04/14/2017  . Open-angle glaucoma 05/14/2016  . Generalized anxiety disorder 02/11/2016  . Morbid obesity (BMI 33.38) 04/25/2015  . Medication management 02/12/2014  . Essential hypertension 08/08/2013  . Hyperlipidemia 08/08/2013   . Abnormal glucose 08/08/2013  . Vitamin D deficiency 08/08/2013    Screening Tests Immunization History  Administered Date(s) Administered  . Influenza, High Dose Seasonal PF 06/06/2014, 05/14/2016  . Influenza-Unspecified 06/13/2013, 05/02/2015  . Pneumococcal Conjugate-13 05/24/2014  . Pneumococcal Polysaccharide-23 01/08/2009  . Tdap 01/28/2012  . 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   Preventative care: Last colonoscopy: 2018 Cologuard 2018  Last mammogram: 07/2017 Last pap smear/pelvic exam: 2016, declines another  DEXA: 02/2016 normal CXR 12/2012 MRI LUMBAR 2009  Prior vaccinations: TD or Tdap: 2013  Influenza: 2016 Pneumococcal: 2010 Prevnar13: 2015 Shingles/Zostavax: 2008  Names of Other Physician/Practitioners you currently use: 1. [facility]- here for primary care 2. [place], eye doctor, last visit Dec 2018 3. Dr. Arrie Aran dentist, last visit Dec 2018 Patient Care Team: Unk Pinto, MD as PCP - General (Internal Medicine) Lafayette Dragon, MD (Inactive) as Consulting Physician (Gastroenterology) Newton Pigg, MD as Consulting Physician (Obstetrics and Gynecology)  Allergies Allergies  Allergen Reactions  . Oxycodone-Acetaminophen Other (See Comments)  panic attack  . Vit C-Cholecalciferol-Rose Hip [Cholecalciferol-Vitamin C] Other (See Comments)    dyspepsia  . Ibuprofen Hives  . Macrodantin [Nitrofurantoin Macrocrystal] Rash    SURGICAL HISTORY She  has a past surgical history that includes right shoulder humerus fx (july 2003); right hand ulner nerve surgery (March 26, 2002); right elbow   surgery (March 26, 2002); right wrist plating (2004); right total hip arthroplasty (2008); right total knee arthroplasty (sept 2012); Tonsillectomy (age 57); Total hip arthroplasty (10/22/2011); Colonoscopy; and Dilation and curettage of uterus. FAMILY HISTORY Her family history  includes Breast cancer in her maternal aunt, maternal grandmother, and paternal aunt; Diabetes in her father and mother; Heart disease in her father and mother; Hypertension in her mother. SOCIAL HISTORY She  reports that  has never smoked. she has never used smokeless tobacco. She reports that she drinks alcohol. She reports that she does not use drugs.  MEDICARE WELLNESS OBJECTIVES: Physical activity: Current Exercise Habits: Structured exercise class, Time (Minutes): 45, Frequency (Times/Week): 3, Weekly Exercise (Minutes/Week): 135, Intensity: Moderate Cardiac risk factors: Cardiac Risk Factors include: advanced age (>91men, >75 women);family history of premature cardiovascular disease;hypertension;dyslipidemia Depression/mood screen:   Depression screen [facility] 2/9 09/27/2017  Decreased Interest 0  Down, Depressed, Hopeless 0  PHQ - 2 Score 0    ADLs:  In your present state of health, do you have any difficulty performing the following activities: 09/27/2017 07/14/2017  Hearing? N N  Vision? N N  Difficulty concentrating or making decisions? N N  Walking or climbing stairs? N N  Dressing or bathing? N N  Doing errands, shopping? N N  Some recent data might be hidden    Cognitive Testing  Alert? Yes  Normal Appearance?Yes  Oriented to person? Yes  Place? Yes   Time? Yes  Recall of three objects?  Yes  Can perform simple calculations? Yes  Displays appropriate judgment?Yes  Can read the correct time from a watch face?Yes  EOL planning: Does Patient Have a Medical Advance Directive?: Yes Type of Advance Directive: Healthcare Power of Attorney, Living will Does patient want to make changes to medical advance directive?: No - Patient declined   Objective:   Today's Vitals   09/27/17 1552  BP: 134/76  Pulse: 70  Resp: 16  Temp: (!) 97.4 F (36.3 C)  SpO2: 97%  Weight: 156 lb 9.6 oz (71 kg)  Height: 5\' 2"  (1.575 m)  PainSc: 0-No pain   Body mass index is 28.64  kg/m.  General appearance: alert, no distress, WD/WN,  female HEENT: normocephalic, sclerae anicteric, TMs pearly, nares patent, no discharge or erythema, pharynx normal Oral cavity: MMM, no lesions Neck: supple, no lymphadenopathy, no thyromegaly, no masses Heart: RRR, normal S1, S2, no murmurs Lungs: CTA bilaterally, no wheezes, rhonchi, or rales Abdomen: +bs, soft, non tender, non distended, no masses, no hepatomegaly, no splenomegaly Musculoskeletal: nontender, no swelling, no obvious deformity Extremities: no edema, no cyanosis, no clubbing Pulses: 2+ symmetric, upper and lower extremities, normal cap refill Neurological: alert, oriented x 3, CN2-12 intact, strength normal upper extremities and lower extremities, sensation normal throughout, DTRs 2+ throughout, no cerebellar signs, gait normal Psychiatric: normal affect, behavior normal, pleasant  Breast: defer Gyn: defer Rectal: defer   Medicare Attestation I have personally reviewed: The patient's medical and social history Their use of alcohol, tobacco or illicit drugs Their current medications and supplements The patient's functional ability including ADLs,fall risks, home safety risks, cognitive, and hearing and visual impairment Diet and physical  activities Evidence for depression or mood disorders  The patient's weight, height, BMI, and visual acuity have been recorded in the chart.  I have made referrals, counseling, and provided education to the patient based on review of the above and I have provided the patient with a written personalized care plan for preventive services.     Vicie Mutters, PA-C   09/27/2017

## 2017-10-05 ENCOUNTER — Encounter: Payer: Self-pay | Admitting: Physician Assistant

## 2017-10-19 NOTE — Progress Notes (Signed)
FOLLOW UP  Assessment and Plan:   Hypertension Well controlled with current medications  Monitor blood pressure at home; patient to call if consistently greater than 130/80 Continue DASH diet.   Reminder to go to the ER if any CP, SOB, nausea, dizziness, severe HA, changes vision/speech, left arm numbness and tingling and jaw pain.  Cholesterol Currently above goal; taking crestor 20 mg three times a week as this is what she tolerates - is working on weight loss.  Continue low cholesterol diet and exercise.  Check lipid panel.   Other abnormal glucose Recent A1Cs at goal Continue diet and exercise.  Perform daily foot/skin check, notify office of any concerning changes.  Defer A1C; check BMP  Overweight  Long discussion about weight loss, diet, and exercise Recommended diet heavy in fruits and veggies and low in animal meats, cheeses, and dairy products, appropriate calorie intake Discussed ideal weight for height and weight goal (145 lb) Will follow up in 3 months  Vitamin D Def At goal at last visit; continue supplementation to maintain goal of 70-100 Defer Vit D level  Insomnia - good sleep hygiene discussed, increase day time activity, try melatonin or benadryl Uses xanax rarely  Continue diet and meds as discussed. Further disposition pending results of labs. Discussed med's effects and SE's.   Over 30 minutes of exam, counseling, chart review, and critical decision making was performed.   Future Appointments  Date Time Provider [place]  01/26/2018  9:30 AM Unk Pinto, MD [facility] None    ----------------------------------------------------------------------------------------------------------------------  HPI 77 y.o. female  presents for 3 month follow up on hypertension, cholesterol, glucose management, weight and vitamin D deficiency. She recently had a positive cologuard screening test and underwent colonoscopy in 05/2017 - 3 non-adenomatous  polyps were removed at that time. Reports she was told this will be her last colonoscopy due to age.   she has a diagnosis of anxiety and is currently on xanax 0.25-0.5 mg TID PRN insomnia, reports symptoms are well controlled on current regimen. Not currently on daily agent, reports she only needs very rarely, takes 0.25 mg 1-2 times a month.   BMI is Body mass index is 28.72 kg/m., she has been working on diet and exercise. She plans to set a goal of 145 lb for this year, will start efforts after cruise next week.  Wt Readings from Last 3 Encounters:  10/20/17 157 lb (71.2 kg)  09/27/17 156 lb 9.6 oz (71 kg)  07/14/17 153 lb 9.6 oz (69.7 kg)   Her blood pressure has been controlled at home, today their BP is BP: 136/68  She does workout- silver sneakers 3 times weekly, yardwork. She denies chest pain, shortness of breath, dizziness.   She is on cholesterol medication (crestor 40 mg daily - takes 1/2 tab three times weekly ) and denies myalgias. Her cholesterol is not at goal. The cholesterol last visit was:   Lab Results  Component Value Date   CHOL 208 (H) 07/14/2017   HDL 76 07/14/2017   LDLCALC 137 (H) 03/31/2017   TRIG 67 07/14/2017   CHOLHDL 2.7 07/14/2017    She has been working on diet and exercise for glucose management, and denies increased appetite, nausea, paresthesia of the feet, polydipsia, polyuria, visual disturbances, vomiting and weight loss. Last A1C in the office was:  Lab Results  Component Value Date   HGBA1C 5.4 07/14/2017   Patient is on Vitamin D supplement and at goal at most recent check:  Lab Results  Component Value Date   VD25OH 74 07/14/2017        Current Medications:  Current Outpatient Medications on File Prior to Visit  Medication Sig  . ALPRAZolam (XANAX) 0.5 MG tablet TAKE ONE-HALF TO ONE TABLET BY MOUTH THREE TIMES DAILY AS NEEDED FOR ANXIETY (Patient taking differently: As Needed)  . aspirin EC 81 MG tablet Take 162 mg by mouth at  bedtime.  . Cholecalciferol (VITAMIN D) 2000 units CAPS Take 1 capsule by mouth daily.  Marland Kitchen estradiol (ESTRACE) 1 MG tablet TAKE 1 TABLET EVERY DAY  . furosemide (LASIX) 80 MG tablet TAKE ONE TABLET BY MOUTH ONCE DAILY (Patient taking differently: Take 40mg  tablet as needed)  . losartan-hydrochlorothiazide (HYZAAR) 50-12.5 MG tablet TAKE 1 TABLET BY MOUTH EVERY DAY  . LYRICA 150 MG capsule TAKE ONE CAPSULE BY MOUTH THREE TIMES DAILY  . medroxyPROGESTERone (PROVERA) 2.5 MG tablet TAKE 1 TABLET EVERY DAY  . montelukast (SINGULAIR) 10 MG tablet Take 1 tablet daily for Allergies  . Naproxen Sodium (ALEVE) 220 MG CAPS Take by mouth.  . Omega-3 Fatty Acids (FISH OIL) 1000 MG CAPS Take by mouth 2 (two) times daily.  . predniSONE (DELTASONE) 20 MG tablet 2 tablets daily for 3 days, 1 tablet daily for 4 days.  . pyridOXINE (VITAMIN B-6) 100 MG tablet Take 100 mg by mouth daily.  . rosuvastatin (CRESTOR) 40 MG tablet Take 1/2 to 1 tablet daily or as directed for Cholesterol (Patient taking differently: Take 1/2 tablet three times a week)  . timolol (TIMOPTIC) 0.5 % ophthalmic solution    No current facility-administered medications on file prior to visit.      Allergies:  Allergies  Allergen Reactions  . Oxycodone-Acetaminophen Other (See Comments)    panic attack  . Vit C-Cholecalciferol-Rose Hip [Cholecalciferol-Vitamin C] Other (See Comments)    dyspepsia  . Ibuprofen Hives  . Macrodantin [Nitrofurantoin Macrocrystal] Rash     Medical History:  Past Medical History:  Diagnosis Date  . Allergy   . Anemia   . Arthritis   . Blood transfusion without reported diagnosis   . Cataract    removed both eyes   . Cervical radicular pain 02/11/2016  . Glaucoma    low pressure glaucoma   . [facility] [id number] on 08/21/2014 due Dec 2016 04/25/2015  . Hyperlipidemia   . Hypertension   . Osteopenia   . Pelvis fracture [facility]) March 26, 2002   both sides fx  . Pre-diabetes   . 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 Vitamin D deficiency    Family history- Reviewed and unchanged Social history- Reviewed and unchanged   Review of Systems:  Review of Systems  Constitutional: Negative for malaise/fatigue and weight loss.  HENT: Negative for hearing loss and tinnitus.   Eyes: Negative for blurred vision and double vision.  Respiratory: Negative for cough, shortness of breath and wheezing.   Cardiovascular: Negative for chest pain, palpitations, orthopnea, claudication and leg swelling.  Gastrointestinal: Negative for abdominal pain, blood in stool, constipation, diarrhea, heartburn, melena, nausea and vomiting.  Genitourinary: Negative.   Musculoskeletal: Negative for joint pain and myalgias.  Skin: Negative for rash.  Neurological: Negative for dizziness, tingling, sensory change, weakness and headaches.  Endo/Heme/Allergies: Negative for polydipsia.  Psychiatric/Behavioral: Negative.   All other systems reviewed and are negative.     Physical Exam: BP 136/68   Pulse 62   Temp 97.7 F (36.5 C)  Ht 5\' 2"  (1.575 m)   Wt 157 lb (71.2 kg)   SpO2 97%   BMI 28.72 kg/m  Wt Readings from Last 3 Encounters:  10/20/17 157 lb (71.2 kg)  09/27/17 156 lb 9.6 oz (71 kg)  07/14/17 153 lb 9.6 oz (69.7 kg)   General Appearance: Well nourished, in no apparent distress. Eyes: PERRLA, EOMs, conjunctiva no swelling or erythema Sinuses: No Frontal/maxillary tenderness ENT/Mouth: Ext aud canals clear, TMs without erythema, bulging. No erythema, swelling, or exudate on post pharynx.  Tonsils not swollen or erythematous. Hearing normal.  Neck: Supple, thyroid normal.  Respiratory: Respiratory effort normal, BS equal bilaterally without rales, rhonchi, wheezing or stridor.  Cardio: RRR with no MRGs. Brisk peripheral pulses without edema.  Abdomen: Soft, + BS.  Non tender, no guarding, rebound, hernias, masses. Lymphatics: Non tender without lymphadenopathy.   Musculoskeletal: Full ROM, 5/5 strength, Normal gait Skin: Warm, dry without rashes, lesions, ecchymosis.  Neuro: Cranial nerves intact. No cerebellar symptoms.  Psych: Awake and oriented X 3, normal affect, Insight and Judgment appropriate.    Izora Ribas, NP 9:37 AM [facility]

## 2017-10-20 ENCOUNTER — Encounter: Payer: Self-pay | Admitting: Adult Health

## 2017-10-20 ENCOUNTER — Ambulatory Visit (INDEPENDENT_AMBULATORY_CARE_PROVIDER_SITE_OTHER): Payer: PPO | Admitting: Adult Health

## 2017-10-20 ENCOUNTER — Ambulatory Visit: Payer: Self-pay | Admitting: Adult Health

## 2017-10-20 VITALS — BP 136/68 | HR 62 | Temp 97.7°F | Ht 62.0 in | Wt 157.0 lb

## 2017-10-20 DIAGNOSIS — R7309 Other abnormal glucose: Secondary | ICD-10-CM | POA: Diagnosis not present

## 2017-10-20 DIAGNOSIS — E663 Overweight: Secondary | ICD-10-CM | POA: Diagnosis not present

## 2017-10-20 DIAGNOSIS — Z79899 Other long term (current) drug therapy: Secondary | ICD-10-CM

## 2017-10-20 DIAGNOSIS — G4700 Insomnia, unspecified: Secondary | ICD-10-CM | POA: Diagnosis not present

## 2017-10-20 DIAGNOSIS — E782 Mixed hyperlipidemia: Secondary | ICD-10-CM

## 2017-10-20 DIAGNOSIS — E559 Vitamin D deficiency, unspecified: Secondary | ICD-10-CM | POA: Diagnosis not present

## 2017-10-20 DIAGNOSIS — I10 Essential (primary) hypertension: Secondary | ICD-10-CM

## 2017-10-20 LAB — CBC WITH DIFFERENTIAL/PLATELET
BASOS PCT: 0.4 %
Basophils Absolute: 19 cells/uL (ref 0–200)
EOS PCT: 3 %
Eosinophils Absolute: 141 cells/uL (ref 15–500)
HCT: 37.5 % (ref 35.0–45.0)
Hemoglobin: 12.4 g/dL (ref 11.7–15.5)
Lymphs Abs: 1575 cells/uL (ref 850–3900)
MCH: 30 pg (ref 27.0–33.0)
MCHC: 33.1 g/dL (ref 32.0–36.0)
MCV: 90.8 fL (ref 80.0–100.0)
MONOS PCT: 11.1 %
MPV: 11.9 fL (ref 7.5–12.5)
Neutro Abs: 2444 cells/uL (ref 1500–7800)
Neutrophils Relative %: 52 %
PLATELETS: 270 10*3/uL (ref 140–400)
RBC: 4.13 10*6/uL (ref 3.80–5.10)
RDW: 12.8 % (ref 11.0–15.0)
Total Lymphocyte: 33.5 %
WBC mixed population: 522 cells/uL (ref 200–950)
WBC: 4.7 10*3/uL (ref 3.8–10.8)

## 2017-10-20 LAB — BASIC METABOLIC PANEL WITH GFR
BUN: 24 mg/dL (ref 7–25)
CHLORIDE: 105 mmol/L (ref 98–110)
CO2: 28 mmol/L (ref 20–32)
Calcium: 8.8 mg/dL (ref 8.6–10.4)
Creat: 0.86 mg/dL (ref 0.60–0.93)
GFR, Est African American: 77 mL/min/{1.73_m2} (ref >=60)
GFR, Est Non African American: 66 mL/min/{1.73_m2} (ref >=60)
GLUCOSE: 88 mg/dL (ref 65–99)
Potassium: 4.7 mmol/L (ref 3.5–5.3)
Sodium: 140 mmol/L (ref 135–146)

## 2017-10-20 LAB — LIPID PANEL
CHOL/HDL RATIO: 2.4 (calc) (ref <5.0)
CHOLESTEROL: 168 mg/dL (ref <200)
HDL: 69 mg/dL (ref >50)
LDL Cholesterol (Calc): 86 mg/dL (calc)
Non-HDL Cholesterol (Calc): 99 mg/dL (calc) (ref <130)
Triglycerides: 47 mg/dL (ref <150)

## 2017-10-20 LAB — HEPATIC FUNCTION PANEL
AG Ratio: 1.5 (calc) (ref 1.0–2.5)
ALKALINE PHOSPHATASE (APISO): 80 U/L (ref 33–130)
ALT: 28 U/L (ref 6–29)
AST: 31 U/L (ref 10–35)
Albumin: 3.7 g/dL (ref 3.6–5.1)
BILIRUBIN INDIRECT: 0.3 mg/dL (ref 0.2–1.2)
Bilirubin, Direct: 0.1 mg/dL (ref 0.0–0.2)
GLOBULIN: 2.4 g/dL (ref 1.9–3.7)
TOTAL PROTEIN: 6.1 g/dL (ref 6.1–8.1)
Total Bilirubin: 0.4 mg/dL (ref 0.2–1.2)

## 2017-10-20 LAB — TSH: TSH: 1.44 mIU/L (ref 0.40–4.50)

## 2017-10-20 MED ORDER — CIPROFLOXACIN HCL 500 MG PO TABS: 500.0000 mg | ORAL_TABLET | Freq: Two times a day (BID) | ORAL | 0 refills | Status: AC

## 2017-10-20 MED ORDER — ONDANSETRON HCL 4 MG PO TABS: 4.0000 mg | ORAL_TABLET | Freq: Every day | ORAL | 1 refills | Status: DC | PRN

## 2017-10-20 NOTE — Patient Instructions (Signed)
Food Poisoning and Traveling Food poisoning is an illness caused by organisms present in something you ate or drank. Some types of food poisoning trigger symptoms quickly. Others may take 1-2 weeks for symptoms to appear. Symptoms of food poisoning include:  Diarrhea.  Cramping.  Fever.  Vomiting.  Dizziness.  Aches and pains.  Before you travel, learn as much as you can about the foodborne illnesses that are common in the areas where you are going. The risk for food poisoning varies from country to country and from one region of the world to another. Countries with a low risk include:  [place].  [place].  [place].  [place].  [place].  Some countries in [place].  Countries with a mid-range risk include:  Countries in [place].  [place].  Some [place] islands.  Countries with a high risk include:  Countries in [place].  Countries in the [place].  Countries in [place].  [place].  Countries in [place] and [place].  What types of illness can be passed through food and drinks? Most cases of food poisoning are caused by bacteria or viruses, such as:  E. coli.  Campylobacteriosis.  Shigellosis.  Salmonellosis.  Norovirus.  Rotavirus.  Astrovirus.  Food poisoning can also be caused by some microscopic parasites. These are organisms that live off of another larger organism. Illness caused by parasites can take 1-2 weeks to appear and may last several months. The illnesses include:  Giardiasis.  Amebiasis.  Cyclosporiasis.  Cryptosporidiosis.  Medicines are available to treat these infections. How can I decrease my risk of food poisoning while traveling? Good hand hygiene always helps protect your health. Carry small bottles of alcohol-based hand sanitizer. Use it to clean your hands before you eat. Also follow these basic guidelines for eating and drinking while traveling. Foods that are generally safe to  eat:  Food that is thoroughly cooked.  Food that is served hot.  Hard-boiled eggs.  Fruits and vegetables you wash and peel yourself.  Milk or cheese that is treated with high heat (pasteurized).  Foods to avoid:  Raw or undercooked foods.  Raw or runny eggs.  Food that is not hot (such as food that has been on a buffet or picnic table for a while).  Raw fruits or vegetables that have not been washed and peeled.  Other items made with fresh vegetables or fruits, like salad and salsa.  Milk or cheese that is not pasteurized.  Meat from local animals, such as monkeys and bats.  Drinks that are generally safe:  Bottled waters, soda, or sports drinks.  Drinks you know were sealed until you opened them.  Water you know has been treated, boiled, or filtered to remove microorganisms.  Ice from treated or bottled water.  Drinks made with boiling water, such as tea or coffee.  Pasteurized milk.  Drinks to avoid:  Water from the tap or a well.  Water from a fresh water source, such as a stream.  Ice from a tap, well, or fresh water source.  Beverages that include water from a well, tap, or fresh water source.  Milk that is not pasteurized.  Beverages from soda fountains.  What should I do if I think I have developed food poisoning while traveling?  If you have been vomiting or have diarrhea, drink water or other fluids to replace what you lost.  Take over-the-counter medicine to stop diarrhea. Consider packing a supply of antidiarrheal medicine to take with you.    Contact your health care provider if your symptoms do not clear up after a few days. How is food poisoning treated? Most cases of food poisoning go away without treatment within 48 hours. Food poisoning caused by bacteria may be treated with antibiotic medicines.Viruses cannot be treated with antibiotics.Illnesses caused by parasites might respond to antiparasitic medicines. You can also treat symptoms  of food poisoning with medicines to:  Prevent or slow diarrhea (antidiarrheals).  Rehydrate your body. Oral rehydration therapy (ORT) helps your body replace fluids and electrolytes lost in diarrhea or vomit.  Treat rashes caused by diarrhea using hydrocortisone cream.  Should I be proactively treated for food poisoning before I travel? Talk to your health care provider about your personal risk for contracting food poisoning while traveling. Discuss the foodborne illnesses that are common in the areas you plan to visit. Make sure you understand how to use any medicines your health care provider recommends. Some medicines can help prevent food poisoning. These include:  Bismuth subsalicylate (BSS). This is an ingredient in over-the-counter antidiarrheal medicines. It might help some adult travelers prevent illness.  Probiotics or "good" bacteria. Taking probiotics might help prevent some illnesses.  Antibiotics. These protect against bacteria only. Taking antibiotics to prevent food poisoning is usually suggested only for people who have a compromised immune system.  How can I learn more?  Visit a travel medicine clinic or speak with a health care provider who specializes in travel medicine as soon as you know your travel plans.  Check the Travelers' Health section on the website of the Centers for Disease Control and Prevention (CDC): http://wwwnc.cdc.gov/travel This information is not intended to replace advice given to you by your health care provider. Make sure you discuss any questions you have with your health care provider. Document Released: 11/07/2002 Document Revised: 01/14/2016 Document Reviewed: 11/24/2013 Elsevier Interactive Patient Education  2018 Elsevier Inc.  

## 2017-11-15 ENCOUNTER — Other Ambulatory Visit: Payer: Self-pay | Admitting: *Deleted

## 2017-11-15 MED ORDER — ROSUVASTATIN CALCIUM 40 MG PO TABS: ORAL_TABLET | ORAL | 5 refills | Status: DC

## 2017-12-02 DIAGNOSIS — Z961 Presence of intraocular lens: Secondary | ICD-10-CM | POA: Diagnosis not present

## 2017-12-02 DIAGNOSIS — H401232 Low-tension glaucoma, bilateral, moderate stage: Secondary | ICD-10-CM | POA: Diagnosis not present

## 2017-12-02 DIAGNOSIS — H04123 Dry eye syndrome of bilateral lacrimal glands: Secondary | ICD-10-CM | POA: Diagnosis not present

## 2017-12-21 ENCOUNTER — Other Ambulatory Visit: Payer: Self-pay | Admitting: Internal Medicine

## 2018-01-26 ENCOUNTER — Ambulatory Visit: Payer: Self-pay | Admitting: Internal Medicine

## 2018-02-07 ENCOUNTER — Other Ambulatory Visit: Payer: Self-pay | Admitting: Internal Medicine

## 2018-02-10 ENCOUNTER — Other Ambulatory Visit: Payer: Self-pay | Admitting: Internal Medicine

## 2018-02-15 ENCOUNTER — Encounter: Payer: Self-pay | Admitting: Internal Medicine

## 2018-02-15 ENCOUNTER — Ambulatory Visit (INDEPENDENT_AMBULATORY_CARE_PROVIDER_SITE_OTHER): Payer: PPO | Admitting: Internal Medicine

## 2018-02-15 VITALS — BP 114/60 | HR 60 | Temp 97.3°F | Resp 6 | Ht 62.0 in | Wt 160.4 lb

## 2018-02-15 DIAGNOSIS — R7309 Other abnormal glucose: Secondary | ICD-10-CM

## 2018-02-15 DIAGNOSIS — E782 Mixed hyperlipidemia: Secondary | ICD-10-CM

## 2018-02-15 DIAGNOSIS — I10 Essential (primary) hypertension: Secondary | ICD-10-CM | POA: Diagnosis not present

## 2018-02-15 DIAGNOSIS — Z79899 Other long term (current) drug therapy: Secondary | ICD-10-CM | POA: Diagnosis not present

## 2018-02-15 DIAGNOSIS — R7303 Prediabetes: Secondary | ICD-10-CM | POA: Diagnosis not present
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
## 2018-02-15 NOTE — Patient Instructions (Signed)

## 2018-02-16 LAB — CBC WITH DIFFERENTIAL/PLATELET
BASOS ABS: 50 {cells}/uL (ref 0–200)
Basophils Relative: 0.7 %
Eosinophils Absolute: 476 cells/uL (ref 15–500)
Eosinophils Relative: 6.7 %
HEMATOCRIT: 36 % (ref 35.0–45.0)
Hemoglobin: 12.2 g/dL (ref 11.7–15.5)
LYMPHS ABS: 2066 {cells}/uL (ref 850–3900)
MCH: 30.3 pg (ref 27.0–33.0)
MCHC: 33.9 g/dL (ref 32.0–36.0)
MCV: 89.6 fL (ref 80.0–100.0)
MPV: 11.9 fL (ref 7.5–12.5)
Monocytes Relative: 8.7 %
NEUTROS PCT: 54.8 %
Neutro Abs: 3891 cells/uL (ref 1500–7800)
PLATELETS: 247 10*3/uL (ref 140–400)
RBC: 4.02 10*6/uL (ref 3.80–5.10)
RDW: 12.8 % (ref 11.0–15.0)
TOTAL LYMPHOCYTE: 29.1 %
WBC mixed population: 618 cells/uL (ref 200–950)
WBC: 7.1 10*3/uL (ref 3.8–10.8)

## 2018-02-16 LAB — COMPLETE METABOLIC PANEL WITH GFR
AG RATIO: 1.5 (calc) (ref 1.0–2.5)
ALKALINE PHOSPHATASE (APISO): 79 U/L (ref 33–130)
ALT: 31 U/L — ABNORMAL HIGH (ref 6–29)
AST: 38 U/L — AB (ref 10–35)
Albumin: 4.1 g/dL (ref 3.6–5.1)
BILIRUBIN TOTAL: 0.5 mg/dL (ref 0.2–1.2)
BUN/Creatinine Ratio: 40 (calc) — ABNORMAL HIGH (ref 6–22)
BUN: 35 mg/dL — ABNORMAL HIGH (ref 7–25)
CALCIUM: 9 mg/dL (ref 8.6–10.4)
CHLORIDE: 101 mmol/L (ref 98–110)
CO2: 27 mmol/L (ref 20–32)
Creat: 0.88 mg/dL (ref 0.60–0.93)
GFR, Est African American: 74 mL/min/{1.73_m2} (ref >=60)
GFR, Est Non African American: 64 mL/min/{1.73_m2} (ref >=60)
Globulin: 2.8 g/dL (calc) (ref 1.9–3.7)
Glucose, Bld: 83 mg/dL (ref 65–99)
POTASSIUM: 3.9 mmol/L (ref 3.5–5.3)
Sodium: 139 mmol/L (ref 135–146)
Total Protein: 6.9 g/dL (ref 6.1–8.1)

## 2018-02-16 LAB — LIPID PANEL
CHOL/HDL RATIO: 2.6 (calc) (ref <5.0)
CHOLESTEROL: 162 mg/dL (ref <200)
HDL: 63 mg/dL (ref >50)
LDL Cholesterol (Calc): 85 mg/dL (calc)
Non-HDL Cholesterol (Calc): 99 mg/dL (calc) (ref <130)
Triglycerides: 67 mg/dL (ref <150)

## 2018-02-16 LAB — TSH: TSH: 1.35 mIU/L (ref 0.40–4.50)

## 2018-02-16 LAB — MAGNESIUM: MAGNESIUM: 2 mg/dL (ref 1.5–2.5)

## 2018-02-16 LAB — HEMOGLOBIN A1C
HEMOGLOBIN A1C: 5.4 %{Hb} (ref <5.7)
MEAN PLASMA GLUCOSE: 108 (calc)
eAG (mmol/L): 6 (calc)

## 2018-02-16 LAB — VITAMIN D 25 HYDROXY (VIT D DEFICIENCY, FRACTURES): VIT D 25 HYDROXY: 69 ng/mL (ref 30–100)

## 2018-02-16 LAB — INSULIN, RANDOM: Insulin: 5.8 u[IU]/mL (ref 2.0–19.6)

## 2018-02-20 ENCOUNTER — Encounter: Payer: Self-pay | Admitting: Internal Medicine

## 2018-05-27 NOTE — Progress Notes (Signed)
FOLLOW UP  Assessment and Plan:   Hypertension Well controlled with current medications  Monitor blood pressure at home; patient to call if consistently greater than 130/80 Continue DASH diet.   Reminder to go to the ER if any CP, SOB, nausea, dizziness, severe HA, changes vision/speech, left arm numbness and tingling and jaw pain.  Cholesterol Currently above goal; taking crestor 20 mg three times a week as this is what she tolerates - is working on weight loss.  Continue low cholesterol diet and exercise.  Check lipid panel.   Other abnormal glucose Recent A1Cs at goal Continue diet and exercise.  Perform daily foot/skin check, notify office of any concerning changes.  Defer A1C; check BMP  Overweight  Long discussion about weight loss, diet, and exercise Recommended diet heavy in fruits and veggies and low in animal meats, cheeses, and dairy products, appropriate calorie intake Discussed ideal weight for height and weight goal (145-150 lb) Will follow up in 3 months  Vitamin D Def At goal at last visit; continue supplementation to maintain goal of 70-100 Defer Vit D level  Insomnia - good sleep hygiene discussed, increase day time activity, try melatonin or benadryl Uses xanax rarely  Continue diet and meds as discussed. Further disposition pending results of labs. Discussed med's effects and SE's.   Over 30 minutes of exam, counseling, chart review, and critical decision making was performed.   Future Appointments  Date Time Provider [place]  09/08/2018 11:00 AM Unk Pinto, MD [facility] None    ----------------------------------------------------------------------------------------------------------------------  HPI 77 y.o. female  presents for 3 month follow up on hypertension, cholesterol, glucose management, weight, anxiety and vitamin D deficiency.    she has a diagnosis of anxiety and is currently on xanax 0.25-0.5 mg TID PRN insomnia, reports  symptoms are well controlled on current regimen. Not currently on daily agent, reports she only needs very rarely, takes 0.25 mg 1-2 times a month.   BMI is Body mass index is 29.81 kg/m., she has been working on diet and exercise. She plans to set a goal of 145 lb for this year. She plans to restart her full exercise routine now that she is back from vacation. She continues with weight watchers.  Wt Readings from Last 3 Encounters:  06/01/18 163 lb (73.9 kg)  02/15/18 160 lb 6.4 oz (72.8 kg)  10/20/17 157 lb (71.2 kg)   Her blood pressure has been controlled at home, today their BP is BP: 120/70  She does workout- silver sneakers 3 times weekly, yardwork. She denies chest pain, shortness of breath, dizziness.   She is on cholesterol medication (crestor 20 mg three times weekly ) and denies myalgias. Her cholesterol is at goal. The cholesterol last visit was:   Lab Results  Component Value Date   CHOL 162 02/15/2018   HDL 63 02/15/2018   LDLCALC 85 02/15/2018   TRIG 67 02/15/2018   CHOLHDL 2.6 02/15/2018    She has been working on diet and exercise for glucose management, and denies increased appetite, nausea, paresthesia of the feet, polydipsia, polyuria, visual disturbances, vomiting and weight loss. Last A1C in the office was:  Lab Results  Component Value Date   HGBA1C 5.4 02/15/2018   Patient is on Vitamin D supplement and at goal at most recent check:   Lab Results  Component Value Date   VD25OH 69 02/15/2018       Current Medications:  Current Outpatient Medications on File Prior to Visit  Medication Sig  .  ALPRAZolam (XANAX) 0.5 MG tablet TAKE 1/2 TO 1 (ONE-HALF TO ONE) TABLET BY MOUTH THREE TIMES DAILY AS NEEDED FOR ANXIETY  . aspirin EC 81 MG tablet Take 162 mg by mouth at bedtime.  . Cholecalciferol (VITAMIN D) 2000 units CAPS Take 1 capsule by mouth daily.  Marland Kitchen estradiol (ESTRACE) 1 MG tablet TAKE 1 TABLET EVERY DAY  . furosemide (LASIX) 80 MG tablet TAKE ONE TABLET  BY MOUTH ONCE DAILY (Patient taking differently: Take 40mg  tablet as needed)  . losartan-hydrochlorothiazide (HYZAAR) 50-12.5 MG tablet TAKE 1 TABLET BY MOUTH EVERY DAY  . LYRICA 150 MG capsule TAKE 1 CAPSULE BY MOUTH THREE TIMES DAILY  . medroxyPROGESTERone (PROVERA) 2.5 MG tablet TAKE 1 TABLET EVERY DAY  . montelukast (SINGULAIR) 10 MG tablet Take 1 tablet daily for Allergies  . Naproxen Sodium (ALEVE) 220 MG CAPS Take by mouth.  . Omega-3 Fatty Acids (FISH OIL) 1000 MG CAPS Take by mouth 2 (two) times daily.  Marland Kitchen pyridOXINE (VITAMIN B-6) 100 MG tablet Take 100 mg by mouth daily.  . rosuvastatin (CRESTOR) 40 MG tablet Take 1/2 to 1 tablet daily or as directed for Cholesterol  . terbinafine (LAMISIL) 250 MG tablet Take 250 mg by mouth daily. Take 1 month on, 1 month off x 3 cycles  . timolol (TIMOPTIC) 0.5 % ophthalmic solution   . predniSONE (DELTASONE) 20 MG tablet 2 tablets daily for 3 days, 1 tablet daily for 4 days.   No current facility-administered medications on file prior to visit.      Allergies:  Allergies  Allergen Reactions  . Oxycodone-Acetaminophen Other (See Comments)    panic attack  . Vit C-Cholecalciferol-Rose Hip [Cholecalciferol-Vitamin C] Other (See Comments)    dyspepsia  . Ibuprofen Hives  . Macrodantin [Nitrofurantoin Macrocrystal] Rash     Medical History:  Past Medical History:  Diagnosis Date  . Allergy   . Anemia   . Arthritis   . Blood transfusion without reported diagnosis   . Cataract    removed both eyes   . Cervical radicular pain 02/11/2016  . Glaucoma    low pressure glaucoma   . [facility] [id number] on 08/21/2014 due Dec 2016 04/25/2015  . Hyperlipidemia   . Hypertension   . Osteopenia   . Pelvis fracture [facility]) March 26, 2002   both sides fx  . Pre-diabetes   . 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 Vitamin D deficiency    Family history- Reviewed and unchanged Social history- Reviewed and unchanged   Review of  Systems:  Review of Systems  Constitutional: Negative for malaise/fatigue and weight loss.  HENT: Negative for hearing loss and tinnitus.   Eyes: Negative for blurred vision and double vision.  Respiratory: Negative for cough, shortness of breath and wheezing.   Cardiovascular: Negative for chest pain, palpitations, orthopnea, claudication and leg swelling.  Gastrointestinal: Negative for abdominal pain, blood in stool, constipation, diarrhea, heartburn, melena, nausea and vomiting.  Genitourinary: Negative.   Musculoskeletal: Negative for joint pain and myalgias.  Skin: Negative for rash.  Neurological: Negative for dizziness, tingling, sensory change, weakness and headaches.  Endo/Heme/Allergies: Negative for polydipsia.  Psychiatric/Behavioral: Negative.   All other systems reviewed and are negative.    Physical Exam: BP 120/70   Pulse (!) 55   Temp (!) 97.2 F (36.2 C)   Ht 5\' 2"  (1.575 m)   Wt 163 lb (73.9 kg)   SpO2 93%   BMI 29.81 kg/m  Wt Readings from Last 3 Encounters:  06/01/18 163 lb (73.9 kg)  02/15/18 160 lb 6.4 oz (72.8 kg)  10/20/17 157 lb (71.2 kg)   General Appearance: Well nourished, in no apparent distress. Eyes: PERRLA, EOMs, conjunctiva no swelling or erythema Sinuses: No Frontal/maxillary tenderness ENT/Mouth: Ext aud canals clear, TMs without erythema, bulging. No erythema, swelling, or exudate on post pharynx.  Tonsils not swollen or erythematous. Hearing normal.  Neck: Supple, thyroid normal.  Respiratory: Respiratory effort normal, BS equal bilaterally without rales, rhonchi, wheezing or stridor.  Cardio: RRR with no MRGs. Brisk peripheral pulses without edema.  Abdomen: Soft, + BS.  Non tender, no guarding, rebound, hernias, masses. Lymphatics: Non tender without lymphadenopathy.  Musculoskeletal: Full ROM, 5/5 strength, Normal gait Skin: Warm, dry without rashes, lesions, ecchymosis.  Neuro: Cranial nerves intact. No cerebellar symptoms.   Psych: Awake and oriented X 3, normal affect, Insight and Judgment appropriate.    Izora Ribas, NP 2:44 PM [facility]

## 2018-06-01 ENCOUNTER — Encounter: Payer: Self-pay | Admitting: Adult Health

## 2018-06-01 ENCOUNTER — Ambulatory Visit (INDEPENDENT_AMBULATORY_CARE_PROVIDER_SITE_OTHER): Payer: PPO | Admitting: Adult Health

## 2018-06-01 VITALS — BP 120/70 | HR 55 | Temp 97.2°F | Ht 62.0 in | Wt 163.0 lb

## 2018-06-01 DIAGNOSIS — F411 Generalized anxiety disorder: Secondary | ICD-10-CM | POA: Diagnosis not present

## 2018-06-01 DIAGNOSIS — I10 Essential (primary) hypertension: Secondary | ICD-10-CM | POA: Diagnosis not present

## 2018-06-01 DIAGNOSIS — E559 Vitamin D deficiency, unspecified: Secondary | ICD-10-CM

## 2018-06-01 DIAGNOSIS — E782 Mixed hyperlipidemia: Secondary | ICD-10-CM | POA: Diagnosis not present

## 2018-06-01 DIAGNOSIS — R7309 Other abnormal glucose: Secondary | ICD-10-CM

## 2018-06-01 DIAGNOSIS — E663 Overweight: Secondary | ICD-10-CM

## 2018-06-01 DIAGNOSIS — Z79899 Other long term (current) drug therapy: Secondary | ICD-10-CM | POA: Diagnosis not present

## 2018-06-01 LAB — COMPLETE METABOLIC PANEL WITH GFR
AG Ratio: 1.7 (calc) (ref 1.0–2.5)
ALT: 27 U/L (ref 6–29)
AST: 33 U/L (ref 10–35)
Albumin: 4 g/dL (ref 3.6–5.1)
Alkaline phosphatase (APISO): 68 U/L (ref 33–130)
BILIRUBIN TOTAL: 0.4 mg/dL (ref 0.2–1.2)
BUN/Creatinine Ratio: 30 (calc) — ABNORMAL HIGH (ref 6–22)
BUN: 26 mg/dL — AB (ref 7–25)
CHLORIDE: 101 mmol/L (ref 98–110)
CO2: 28 mmol/L (ref 20–32)
Calcium: 8.7 mg/dL (ref 8.6–10.4)
Creat: 0.87 mg/dL (ref 0.60–0.93)
GFR, EST AFRICAN AMERICAN: 75 mL/min/{1.73_m2} (ref >=60)
GFR, EST NON AFRICAN AMERICAN: 65 mL/min/{1.73_m2} (ref >=60)
GLUCOSE: 82 mg/dL (ref 65–99)
Globulin: 2.4 g/dL (calc) (ref 1.9–3.7)
Potassium: 4.3 mmol/L (ref 3.5–5.3)
Sodium: 136 mmol/L (ref 135–146)
TOTAL PROTEIN: 6.4 g/dL (ref 6.1–8.1)

## 2018-06-01 LAB — LIPID PANEL
Cholesterol: 144 mg/dL (ref <200)
HDL: 59 mg/dL (ref >50)
LDL CHOLESTEROL (CALC): 73 mg/dL
Non-HDL Cholesterol (Calc): 85 mg/dL (calc) (ref <130)
TRIGLYCERIDES: 43 mg/dL (ref <150)
Total CHOL/HDL Ratio: 2.4 (calc) (ref <5.0)

## 2018-06-01 LAB — CBC WITH DIFFERENTIAL/PLATELET
BASOS ABS: 28 {cells}/uL (ref 0–200)
BASOS PCT: 0.4 %
EOS PCT: 3.8 %
Eosinophils Absolute: 262 cells/uL (ref 15–500)
HCT: 36.2 % (ref 35.0–45.0)
Hemoglobin: 12.3 g/dL (ref 11.7–15.5)
Lymphs Abs: 2222 cells/uL (ref 850–3900)
MCH: 31.1 pg (ref 27.0–33.0)
MCHC: 34 g/dL (ref 32.0–36.0)
MCV: 91.4 fL (ref 80.0–100.0)
MPV: 12.5 fL (ref 7.5–12.5)
Monocytes Relative: 9.3 %
Neutro Abs: 3747 cells/uL (ref 1500–7800)
Neutrophils Relative %: 54.3 %
PLATELETS: 247 10*3/uL (ref 140–400)
RBC: 3.96 10*6/uL (ref 3.80–5.10)
RDW: 12.9 % (ref 11.0–15.0)
TOTAL LYMPHOCYTE: 32.2 %
WBC mixed population: 642 cells/uL (ref 200–950)
WBC: 6.9 10*3/uL (ref 3.8–10.8)

## 2018-06-01 LAB — TSH: TSH: 1 m[IU]/L (ref 0.40–4.50)

## 2018-06-01 NOTE — Patient Instructions (Addendum)
  Goals    . Weight (lb) < 150 lb (68 kg)      Know what a healthy weight is for you (roughly BMI <25) and aim to maintain this  Aim for 7+ servings of fruits and vegetables daily  65-80+ fluid ounces of water or unsweet tea for healthy kidneys  Limit to max 1 drink of alcohol per day; avoid smoking/tobacco  Limit animal fats in diet for cholesterol and heart health - choose grass fed whenever available  Avoid highly processed foods, and foods high in saturated/trans fats  Aim for low stress - take time to unwind and care for your mental health  Aim for 150 min of moderate intensity exercise weekly for heart health, and weights twice weekly for bone health  Aim for 7-9 hours of sleep daily  Drink 1/2 your body weight in fluid ounces of water daily; drink a tall glass of water 30 min before meals  Don't eat until you're stuffed- listen to your stomach and eat until you are 80% full   Try eating off of a salad plate; wait 10 min after finishing before going back for seconds  Start by eating the vegetables on your plate; aim for 50% of your meals to be fruits or vegetables  Then eat your protein - lean meats (grass fed if possible), fish, beans, nuts in moderation  Eat your carbs/starch last ONLY if you still are hungry. If you can, stop before finishing it all  Avoid sugar and flour - the closer it looks to it's original form in nature, typically the better it is for you  Splurge in moderation - "assign" days when you get to splurge and have the "bad stuff" - I like to follow a 80% - 20% plan- "good" choices 80 % of the time, "bad" choices in moderation 20% of the time  Simple equation is: Calories out > calories in = weight loss - even if you eat the bad stuff, if you limit portions, you will still lose weight       Bad carbs also include fruit juice, alcohol, and sweet tea. These are empty calories that do not signal to your brain that you are full.   Please remember  the good carbs are still carbs which convert into sugar. So please measure them out no more than 1/2-1 cup of rice, oatmeal, pasta, and beans  Veggies are however free foods! Pile them on.   Not all fruit is created equal. Please see the list below, the fruit at the bottom is higher in sugars than the fruit at the top. Please avoid all dried fruits.

## 2018-06-14 DIAGNOSIS — M25512 Pain in left shoulder: Secondary | ICD-10-CM | POA: Diagnosis not present

## 2018-07-04 DIAGNOSIS — M25512 Pain in left shoulder: Secondary | ICD-10-CM | POA: Diagnosis not present

## 2018-07-06 DIAGNOSIS — H5340 Unspecified visual field defects: Secondary | ICD-10-CM | POA: Diagnosis not present

## 2018-07-06 DIAGNOSIS — H43813 Vitreous degeneration, bilateral: Secondary | ICD-10-CM | POA: Diagnosis not present

## 2018-07-06 DIAGNOSIS — H401232 Low-tension glaucoma, bilateral, moderate stage: Secondary | ICD-10-CM | POA: Diagnosis not present

## 2018-07-06 DIAGNOSIS — H5211 Myopia, right eye: Secondary | ICD-10-CM | POA: Diagnosis not present

## 2018-08-26 ENCOUNTER — Other Ambulatory Visit: Payer: Self-pay | Admitting: Internal Medicine

## 2018-09-08 ENCOUNTER — Ambulatory Visit (INDEPENDENT_AMBULATORY_CARE_PROVIDER_SITE_OTHER): Payer: PPO | Admitting: Internal Medicine

## 2018-09-08 ENCOUNTER — Other Ambulatory Visit: Payer: Self-pay | Admitting: Internal Medicine

## 2018-09-08 ENCOUNTER — Encounter: Payer: Self-pay | Admitting: Internal Medicine

## 2018-09-08 VITALS — BP 118/80 | HR 64 | Temp 97.8°F | Resp 16 | Ht 62.5 in | Wt 162.6 lb

## 2018-09-08 DIAGNOSIS — E782 Mixed hyperlipidemia: Secondary | ICD-10-CM

## 2018-09-08 DIAGNOSIS — Z0001 Encounter for general adult medical examination with abnormal findings: Secondary | ICD-10-CM

## 2018-09-08 DIAGNOSIS — R7309 Other abnormal glucose: Secondary | ICD-10-CM | POA: Diagnosis not present

## 2018-09-08 DIAGNOSIS — R7303 Prediabetes: Secondary | ICD-10-CM

## 2018-09-08 DIAGNOSIS — I10 Essential (primary) hypertension: Secondary | ICD-10-CM

## 2018-09-08 DIAGNOSIS — Z136 Encounter for screening for cardiovascular disorders: Secondary | ICD-10-CM

## 2018-09-08 DIAGNOSIS — Z1212 Encounter for screening for malignant neoplasm of rectum: Secondary | ICD-10-CM

## 2018-09-08 DIAGNOSIS — Z1231 Encounter for screening mammogram for malignant neoplasm of breast: Secondary | ICD-10-CM

## 2018-09-08 DIAGNOSIS — Z1211 Encounter for screening for malignant neoplasm of colon: Secondary | ICD-10-CM

## 2018-09-08 DIAGNOSIS — Z0000 Encounter for general adult medical examination without abnormal findings: Secondary | ICD-10-CM | POA: Diagnosis not present

## 2018-09-08 DIAGNOSIS — Z79899 Other long term (current) drug therapy: Secondary | ICD-10-CM

## 2018-09-08 DIAGNOSIS — Z8249 Family history of ischemic heart disease and other diseases of the circulatory system: Secondary | ICD-10-CM

## 2018-09-08 DIAGNOSIS — E559 Vitamin D deficiency, unspecified: Secondary | ICD-10-CM

## 2018-09-08 NOTE — Progress Notes (Signed)
[facility] Unk Pinto, M.D.     Uvaldo Bristle. Silverio Lay, [name]-C Liane Comber, [place] [address] [place], [place] [address] Telephone [phone] Telefax [phone] Annual Screening/Preventative Visit & Comprehensive Evaluation &  Examination     This very nice 78 y.o. [name] presents for a Screening /Preventative Visit & comprehensive evaluation and management of multiple medical co-morbidities.  Patient has been followed for HTN, HLD, Prediabetes  and Vitamin D Deficiency.      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      HTN predates since 2008. Patient's BP has been controlled at home and patient denies any cardiac symptoms as chest pain, palpitations, shortness of breath, dizziness or ankle swelling. Today's BP is at goal - 118/80.     Patient's hyperlipidemia is controlled with diet and Crestor. Patient denies myalgias or other medication SE's. Last lipids were at goal: Lab Results  Component Value Date   CHOL 144 06/01/2018   HDL 59 06/01/2018   LDLCALC 73 06/01/2018   TRIG 43 06/01/2018   CHOLHDL 2.4 06/01/2018      Patient has hx/o prediabetes (A1c 6.0% / 2011) and patient denies reactive hypoglycemic symptoms, visual blurring, diabetic polys or paresthesias. Last A1c was Normal & aty goal: Lab Results  Component Value Date   HGBA1C 5.4 02/15/2018      Finally, patient has history of Vitamin D Deficiency and last Vitamin D was at goal: Lab Results  Component Value Date   VD25OH 69 02/15/2018   Current Outpatient Medications on File Prior to Visit  Medication Sig  . ALPRAZolam (XANAX) 0.5 MG tablet TAKE 1/2 TO 1 (ONE-HALF TO ONE) TABLET BY MOUTH THREE TIMES DAILY AS NEEDED FOR ANXIETY  . aspirin EC 81 MG tablet Take 162 mg by mouth at bedtime.  . Cholecalciferol (VITAMIN D) 2000 units CAPS Take 1 capsule by mouth  daily.  Marland Kitchen estradiol (ESTRACE) 1 MG tablet TAKE 1 TABLET EVERY DAY  . furosemide (LASIX) 80 MG tablet TAKE ONE TABLET BY MOUTH ONCE DAILY (Patient taking differently: Take 40mg  tablet as needed)  . losartan-hydrochlorothiazide (HYZAAR) 50-12.5 MG tablet TAKE 1 TABLET BY MOUTH EVERY DAY  . medroxyPROGESTERone (PROVERA) 2.5 MG tablet TAKE 1 TABLET EVERY DAY  . montelukast (SINGULAIR) 10 MG tablet Take 1 tablet daily for Allergies  . Naproxen Sodium (ALEVE) 220 MG CAPS Take by mouth.  . Omega-3 Fatty Acids (FISH OIL) 1000 MG CAPS Take by mouth 2 (two) times daily.  . pregabalin (LYRICA) 150 MG capsule TAKE 1 CAPSULE BY MOUTH THREE TIMES DAILY  . pyridOXINE (VITAMIN B-6) 100 MG tablet Take 100 mg by mouth daily.  . rosuvastatin (CRESTOR) 40 MG tablet Take 1/2 to 1 tablet daily or as directed for Cholesterol  . terbinafine (LAMISIL) 250 MG tablet Take 250 mg by mouth daily. Take 1 month on, 1 month off x 3 cycles  . timolol (TIMOPTIC) 0.5 % ophthalmic solution    No current facility-administered medications on file prior to visit.    Allergies  Allergen Reactions  . Oxycodone-Acetaminophen Other (See Comments)    panic attack  . Vit C-Cholecalciferol-Rose Hip [Cholecalciferol-Vitamin C] Other (See Comments)    dyspepsia  . Ibuprofen Hives  . Macrodantin [Nitrofurantoin Macrocrystal] Rash   Past Medical History:  Diagnosis Date  . Allergy   . Anemia   . Arthritis   .  Blood transfusion without reported diagnosis   . Cataract    removed both eyes   . Cervical radicular pain 02/11/2016  . Glaucoma    low pressure glaucoma   . [facility] [id number] on 08/21/2014 due Dec 2016 04/25/2015  . Hyperlipidemia   . Hypertension   . Osteopenia   . Pelvis fracture [facility] March 26, 2002   both sides fx  . Pre-diabetes   . 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 Vitamin D deficiency    Health Maintenance  Topic Date Due  . MAMMOGRAM  08/11/2018  . TETANUS/TDAP  01/27/2022  .  INFLUENZA VACCINE  Completed  . DEXA SCAN  Completed  . PNA vac Low Risk Adult  Completed   Immunization History  Administered Date(s) Administered  . Influenza, High Dose Seasonal PF 06/06/2014, 05/14/2016  . Influenza-Unspecified 06/13/2013, 05/02/2015, 06/24/2018  . Pneumococcal Conjugate-13 05/24/2014  . Pneumococcal Polysaccharide-23 01/08/2009  . Tdap 01/28/2012  . Zoster 08/31/2006   Last Colon - 06/15/2017 - Dr Havery Moros - recc no f/u due to age  Last MGM - last was 12/12//2018 patient aware overdue & plans to schedule.  Past Surgical History:  Procedure Laterality Date  . COLONOSCOPY    . DILATION AND CURETTAGE OF UTERUS     age 27   . right elbow   surgery  March 26, 2002  . right hand ulner nerve surgery  March 26, 2002   I and d done right hand/wrist  . right shoulder humerus fx  july 2003   surgery done  . right total hip arthroplasty  2008  . right total knee arthroplasty  sept 2012  . right wrist plating  2004  . TONSILLECTOMY  age 34  . TOTAL HIP ARTHROPLASTY  10/22/2011   Procedure: TOTAL HIP ARTHROPLASTY ANTERIOR APPROACH;  Surgeon: Mauri Pole, MD;  Location: [facility];  Service: Orthopedics;  Laterality: Left;  Left Total Hip Arthroplasty,  Anterior Approach    Family History  Problem Relation Age of Onset  . Hypertension Mother   . Diabetes Mother   . Heart disease Mother   . Diabetes Father   . Heart disease Father   . Breast cancer Maternal Aunt        early 70's  . Breast cancer Maternal Grandmother        early 40's  . Breast cancer Paternal Aunt        68's  . Colon cancer Neg Hx   . Esophageal cancer Neg Hx   . Rectal cancer Neg Hx   . Stomach cancer Neg Hx   . Pancreatic cancer Neg Hx    Social History   Tobacco Use  . Smoking status: Never Smoker  . Smokeless tobacco: Never Used  Substance Use Topics  . Alcohol use: Yes    Comment: social  . Drug use: No    ROS Constitutional: Denies fever, chills, weight loss/gain, headaches,  insomnia,  night sweats, and change in appetite. Does c/o fatigue. Eyes: Denies redness, blurred vision, diplopia, discharge, itchy, watery eyes.  ENT: Denies discharge, congestion, post nasal drip, epistaxis, sore throat, earache, hearing loss, dental pain, Tinnitus, Vertigo, Sinus pain, snoring.  Cardio: Denies chest pain, palpitations, irregular heartbeat, syncope, dyspnea, diaphoresis, orthopnea, PND, claudication, edema Respiratory: denies cough, dyspnea, DOE, pleurisy, hoarseness, laryngitis, wheezing.  Gastrointestinal: Denies dysphagia, heartburn, reflux, water brash, pain, cramps, nausea, vomiting, bloating, diarrhea, constipation, hematemesis, melena, hematochezia, jaundice, hemorrhoids Genitourinary: Denies dysuria, frequency,  urgency, nocturia, hesitancy, discharge, hematuria, flank pain Breast: Breast lumps, nipple discharge, bleeding.  Musculoskeletal: Denies arthralgia, myalgia, stiffness, Jt. Swelling, pain, limp, and strain/sprain. Denies falls. Skin: Denies puritis, rash, hives, warts, acne, eczema, changing in skin lesion Neuro: No weakness, tremor, incoordination, spasms, paresthesia, pain Psychiatric: Denies confusion, memory loss, sensory loss. Denies Depression. Endocrine: Denies change in weight, skin, hair change, nocturia, and paresthesia, diabetic polys, visual blurring, hyper / hypo glycemic episodes.  Heme/Lymph: No excessive bleeding, bruising, enlarged lymph nodes.  Physical Exam  BP 118/80   Pulse 64   Temp 97.8 F (36.6 C)   Resp 16   Ht 5' 2.5" (1.588 m)   Wt 162 lb 9.6 oz (73.8 kg)   BMI 29.27 kg/m   General Appearance: Well nourished, well groomed and in no apparent distress.  Eyes: PERRLA, EOMs, conjunctiva no swelling or erythema, normal fundi and vessels. Sinuses: No frontal/maxillary tenderness ENT/Mouth: EACs patent / TMs  nl. Nares clear without erythema, swelling, mucoid exudates. Oral hygiene is good. No erythema, swelling, or exudate. Tongue  normal, non-obstructing. Tonsils not swollen or erythematous. Hearing normal.  Neck: Supple, thyroid not palpable. No bruits, nodes or JVD. Respiratory: Respiratory effort normal.  BS equal and clear bilateral without rales, rhonci, wheezing or stridor. Cardio: Heart sounds are normal with regular rate and rhythm and no murmurs, rubs or gallops. Peripheral pulses are normal and equal bilaterally without edema. No aortic or femoral bruits. Chest: symmetric with normal excursions and percussion. Breasts: Symmetric, without lumps, nipple discharge, retractions, or fibrocystic changes.  Abdomen: Flat, soft with bowel sounds active. Nontender, no guarding, rebound, hernias, masses, or organomegaly.  Lymphatics: Non tender without lymphadenopathy.  Genitourinary:  Musculoskeletal: Full ROM all peripheral extremities, joint stability, 5/5 strength, and normal gait. Skin: Warm and dry without rashes, lesions, cyanosis, clubbing or  ecchymosis.  Neuro: Cranial nerves intact, reflexes equal bilaterally. Normal muscle tone, no cerebellar symptoms. Sensation intact.  Pysch: Alert and oriented X 3, normal affect, Insight and Judgment appropriate.   Assessment and Plan  1. Annual Preventative Screening Examination  2. Essential hypertension  - EKG 12-Lead - Urinalysis, Routine w reflex microscopic - Microalbumin / creatinine urine ratio - CBC with Differential/Platelet - COMPLETE METABOLIC PANEL WITH GFR - Magnesium - TSH  3. Hyperlipidemia, mixed  - EKG 12-Lead - Lipid panel - TSH  4. Abnormal glucose  - EKG 12-Lead - Hemoglobin A1c - Insulin, random  5. Vitamin D deficiency  - VITAMIN D 25 Hydroxyl  6. Prediabetes  - EKG 12-Lead - Hemoglobin A1c - Insulin, random  7. Screening for ischemic heart disease  - EKG 12-Lead  8. FHx: heart disease  - EKG 12-Lead  9. Screening for colorectal cancer  - POC Hemoccult Bld/Stl  10. Medication management  - Urinalysis, Routine  w reflex microscopic - Microalbumin / creatinine urine ratio - CBC with Differential/Platelet - COMPLETE METABOLIC PANEL WITH GFR - Magnesium - Lipid panel - TSH - Hemoglobin A1c - Insulin, random - VITAMIN D 25 Hydroxyl       Patient was counseled in prudent diet to achieve/maintain BMI less than 25 for weight control, BP monitoring, regular exercise and medications. Discussed med's effects and SE's. Screening labs and tests as requested with regular follow-up as recommended. Over 40 minutes of exam, counseling, chart review and high complex critical decision making was performed.

## 2018-09-08 NOTE — Patient Instructions (Signed)

## 2018-09-09 LAB — COMPLETE METABOLIC PANEL WITH GFR
AG Ratio: 1.7 (calc) (ref 1.0–2.5)
ALBUMIN MSPROF: 4.3 g/dL (ref 3.6–5.1)
ALKALINE PHOSPHATASE (APISO): 89 U/L (ref 33–130)
ALT: 36 U/L — ABNORMAL HIGH (ref 6–29)
AST: 38 U/L — AB (ref 10–35)
BILIRUBIN TOTAL: 0.5 mg/dL (ref 0.2–1.2)
BUN / CREAT RATIO: 33 (calc) — AB (ref 6–22)
BUN: 30 mg/dL — ABNORMAL HIGH (ref 7–25)
CHLORIDE: 98 mmol/L (ref 98–110)
CO2: 30 mmol/L (ref 20–32)
Calcium: 9.1 mg/dL (ref 8.6–10.4)
Creat: 0.91 mg/dL (ref 0.60–0.93)
GFR, Est African American: 71 mL/min/{1.73_m2} (ref >=60)
GFR, Est Non African American: 61 mL/min/{1.73_m2} (ref >=60)
GLOBULIN: 2.6 g/dL (ref 1.9–3.7)
Glucose, Bld: 77 mg/dL (ref 65–99)
POTASSIUM: 4.5 mmol/L (ref 3.5–5.3)
SODIUM: 138 mmol/L (ref 135–146)
Total Protein: 6.9 g/dL (ref 6.1–8.1)

## 2018-09-09 LAB — CBC WITH DIFFERENTIAL/PLATELET
ABSOLUTE MONOCYTES: 691 {cells}/uL (ref 200–950)
BASOS ABS: 58 {cells}/uL (ref 0–200)
Basophils Relative: 0.8 %
EOS ABS: 108 {cells}/uL (ref 15–500)
Eosinophils Relative: 1.5 %
HEMATOCRIT: 38.4 % (ref 35.0–45.0)
HEMOGLOBIN: 12.7 g/dL (ref 11.7–15.5)
Lymphs Abs: 1750 cells/uL (ref 850–3900)
MCH: 30.2 pg (ref 27.0–33.0)
MCHC: 33.1 g/dL (ref 32.0–36.0)
MCV: 91.2 fL (ref 80.0–100.0)
MPV: 12 fL (ref 7.5–12.5)
Monocytes Relative: 9.6 %
NEUTROS ABS: 4594 {cells}/uL (ref 1500–7800)
Neutrophils Relative %: 63.8 %
Platelets: 293 10*3/uL (ref 140–400)
RBC: 4.21 10*6/uL (ref 3.80–5.10)
RDW: 13 % (ref 11.0–15.0)
Total Lymphocyte: 24.3 %
WBC: 7.2 10*3/uL (ref 3.8–10.8)

## 2018-09-09 LAB — URINALYSIS, ROUTINE W REFLEX MICROSCOPIC
BILIRUBIN URINE: NEGATIVE
Glucose, UA: NEGATIVE
Hgb urine dipstick: NEGATIVE
KETONES UR: NEGATIVE
Leukocytes, UA: NEGATIVE
Nitrite: NEGATIVE
PROTEIN: NEGATIVE
SPECIFIC GRAVITY, URINE: 1.009 (ref 1.001–1.03)
pH: <=5 (ref 5.0–8.0)

## 2018-09-09 LAB — VITAMIN D 25 HYDROXY (VIT D DEFICIENCY, FRACTURES): VIT D 25 HYDROXY: 67 ng/mL (ref 30–100)

## 2018-09-09 LAB — INSULIN, RANDOM: Insulin: 6.1 u[IU]/mL (ref 2.0–19.6)

## 2018-09-09 LAB — MAGNESIUM: MAGNESIUM: 2.1 mg/dL (ref 1.5–2.5)

## 2018-09-09 LAB — LIPID PANEL
CHOL/HDL RATIO: 2.5 (calc) (ref <5.0)
CHOLESTEROL: 174 mg/dL (ref <200)
HDL: 70 mg/dL (ref >50)
LDL CHOLESTEROL (CALC): 86 mg/dL
Non-HDL Cholesterol (Calc): 104 mg/dL (calc) (ref <130)
Triglycerides: 86 mg/dL (ref <150)

## 2018-09-09 LAB — HEMOGLOBIN A1C
EAG (MMOL/L): 6.3 (calc)
Hgb A1c MFr Bld: 5.6 % of total Hgb (ref <5.7)
Mean Plasma Glucose: 114 (calc)

## 2018-09-09 LAB — MICROALBUMIN / CREATININE URINE RATIO
CREATININE, URINE: 29 mg/dL (ref 20–275)
Microalb Creat Ratio: 7 mcg/mg creat (ref <30)
Microalb, Ur: 0.2 mg/dL

## 2018-09-09 LAB — TSH: TSH: 1.59 mIU/L (ref 0.40–4.50)

## 2018-09-11 ENCOUNTER — Encounter: Payer: Self-pay | Admitting: Internal Medicine

## 2018-10-10 ENCOUNTER — Ambulatory Visit
Admission: RE | Admit: 2018-10-10 | Discharge: 2018-10-10 | Disposition: A | Discharge status: Home / Self Care | Payer: PPO | Source: Ambulatory Visit | Attending: Internal Medicine | Admitting: Internal Medicine

## 2018-10-10 DIAGNOSIS — Z1231 Encounter for screening mammogram for malignant neoplasm of breast: Secondary | ICD-10-CM | POA: Diagnosis not present

## 2018-10-30 ENCOUNTER — Other Ambulatory Visit: Payer: Self-pay | Admitting: Internal Medicine

## 2018-12-21 ENCOUNTER — Encounter: Payer: Self-pay | Admitting: Adult Health

## 2018-12-21 NOTE — Progress Notes (Addendum)
Virtual Visit via Telephone Note  I connected with Gwendolyn [name] on 12/22/18 at 10:00 AM EDT by virtual visit by televideo modality and verified that I am speaking with the correct person using two identifiers.   I discussed the limitations, risks, security and privacy concerns of performing an evaluation and management service  and the availability of in person appointments. I also discussed with the patient that there may be a patient responsible charge related to this service. The patient expressed understanding and agreed to proceed.   I discussed the assessment and treatment plan with the patient. The patient was provided an opportunity to ask questions and all were answered. The patient agreed with the plan and demonstrated an understanding of the instructions.   The patient was advised to call back or seek an in-person evaluation if the symptoms worsen or if the condition fails to improve as anticipated.  I provided 28 minutes of non-face-to-face time during this encounter.   Gwendolyn Ribas, NP     MEDICARE ANNUAL WELLNESS VISIT AND FOLLOW UP  Assessment:    Encounter for Medicare annual wellness exam 1 year  Essential hypertension - continue medications, DASH diet, exercise and monitor at home. Call if greater than 130/80.  -     CBC with Differential/Platelet -     CMP/GFR  Hyperlipidemia -continue medications, check lipids, decrease fatty foods, increase activity.  -     Lipid panel -     TSH  Abnormal glucose Discussed general issues about diabetes pathophysiology and management., Educational material distributed., Suggested low cholesterol diet., Encouraged aerobic exercise., Discussed foot care., Reminded to get yearly retinal exam.  Medication management -     Magnesium  Morbid Obesity with co morbidities - long discussion about weight loss, diet, and exercise  Generalized anxiety disorder continue medications, stress management techniques  discussed, increase water, good sleep hygiene discussed, increase exercise, and increase veggies.   Cervical radicular pain Continue ortho follow up  Open-angle glaucoma, unspecified glaucoma stage, unspecified laterality, unspecified open-angle glaucoma type Continue eye doctor follow up  Vitamin D deficiency Continue supplement   Defer all labs in light of covid 19 and telehealth visit  Over 30 minutes of exam, counseling, chart review, and critical decision making was performed Future Appointments  Date Time Provider [place]  03/28/2019 10:30 AM Unk Pinto, MD [facility] None  10/02/2019 10:00 AM Unk Pinto, MD [facility] None    Plan:   During the course of the visit the patient was educated and counseled about appropriate screening and preventive services including:    Pneumococcal vaccine   Influenza vaccine  Td vaccine  Prevnar 13  Screening electrocardiogram  Screening mammography  Bone densitometry screening  Colorectal cancer screening  Diabetes screening  Glaucoma screening  Nutrition counseling   Advanced directives: given info/requested copies   Subjective:   Gwendolyn [name] is a 78 y.o. female who presents for Medicare Annual Wellness Visit and 3 month follow up on hypertension, prediabetes, hyperlipidemia, vitamin D def.  She has hx of anxiety, has been prescribed xanax though improved recently and hasn't used in several months.   She is on estrogen and progesterone, she is on bASA, she is down to estradiol 0.5 mg, provera 1.25 mg daily, prescribed by Dr. Ulanda Edison.   Cervical arthritis/radicular pain, managing fairly with lifestyle modification (sleeping on back, flatter pillow), using aleve PRN, lyrica.    BMI is Body mass index is 29.7 kg/m., she has been working on diet and  exercise. She is on weight watchers and following weekly via zoom.  Wt Readings from Last 3 Encounters:  12/22/18 165 lb (74.8 kg)   09/08/18 162 lb 9.6 oz (73.8 kg)  06/01/18 163 lb (73.9 kg)   Her blood pressure has been controlled at home, today their BP is BP: 128/73  She does workout, goes to [employer] 3 days a week. She denies chest pain, shortness of breath, dizziness. She takes lasix AS needed for swelling but has severe leg cramps that night in her ankles, takes the kdur 44meq with it but still with pain.    She is on cholesterol medication and has myalgias with crestor (20 mg three times a week) on the days she takes it. Her cholesterol is at goal. The cholesterol last visit was:   Lab Results  Component Value Date   CHOL 174 09/08/2018   HDL 70 09/08/2018   LDLCALC 86 09/08/2018   TRIG 86 09/08/2018   CHOLHDL 2.5 09/08/2018    She has been working on diet and exercise for glucose management, and denies paresthesia of the feet, polydipsia, polyuria and visual disturbances. Last A1C in the office was:  Lab Results  Component Value Date   HGBA1C 5.6 09/08/2018   Lab Results  Component Value Date   GFRNONAA 61 09/08/2018   Patient is on Vitamin D supplement.   Lab Results  Component Value Date   VD25OH 67 09/08/2018       Medication Review Current Outpatient Medications on File Prior to Visit  Medication Sig  . ALPRAZolam (XANAX) 0.5 MG tablet TAKE 1/2 TO 1 (ONE-HALF TO ONE) TABLET BY MOUTH THREE TIMES DAILY AS NEEDED FOR ANXIETY  . aspirin EC 81 MG tablet Take 162 mg by mouth at bedtime.  . Cholecalciferol (VITAMIN D) 2000 units CAPS Take 1 capsule by mouth daily.  Marland Kitchen estradiol (ESTRACE) 1 MG tablet TAKE 1 TABLET EVERY DAY  . furosemide (LASIX) 80 MG tablet TAKE ONE TABLET BY MOUTH ONCE DAILY (Patient taking differently: 40 mg as needed. )  . losartan-hydrochlorothiazide (HYZAAR) 50-12.5 MG tablet TAKE 1 TABLET BY MOUTH EVERY DAY  . medroxyPROGESTERone (PROVERA) 2.5 MG tablet TAKE 1 TABLET EVERY DAY  . montelukast (SINGULAIR) 10 MG tablet Take 1 tablet daily for Allergies  . Naproxen Sodium  (ALEVE) 220 MG CAPS Take by mouth as needed.   . Omega-3 Fatty Acids (FISH OIL) 1000 MG CAPS Take by mouth 2 (two) times daily.  . pregabalin (LYRICA) 150 MG capsule TAKE 1 CAPSULE BY MOUTH THREE TIMES DAILY (Patient taking differently: Takes one tablet M,W,F)  . pyridOXINE (VITAMIN B-6) 100 MG tablet Take 100 mg by mouth 2 (two) times a day.   . rosuvastatin (CRESTOR) 40 MG tablet Take 1/2 to 1 tablet daily or as directed for Cholesterol  . timolol (TIMOPTIC) 0.5 % ophthalmic solution    No current facility-administered medications on file prior to visit.     Current Problems (verified) Patient Active Problem List   Diagnosis Date Noted  . Cervical arthritis 09/27/2017  . Open-angle glaucoma 05/14/2016  . Generalized anxiety disorder 02/11/2016  . Overweight (BMI 25.0-29.9) 04/25/2015  . Medication management 02/12/2014  . Essential hypertension 08/08/2013  . Hyperlipidemia, mixed 08/08/2013  . Abnormal glucose 08/08/2013  . Vitamin D deficiency 08/08/2013    Screening Tests Immunization History  Administered Date(s) Administered  . Influenza, High Dose Seasonal PF 06/06/2014, 05/14/2016  . Influenza-Unspecified 06/13/2013, 05/02/2015, 06/24/2018  . Pneumococcal Conjugate-13 05/24/2014  . Pneumococcal  Polysaccharide-23 01/08/2009  . Tdap 01/28/2012  . 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   Preventative care: Last colonoscopy: 2018 Cologuard 2018 (positive) Last mammogram: 10/2018 Last pap smear/pelvic exam: 2019 Dr. Ulanda Edison, declines another  DEXA: 02/2016 normal CXR 12/2012 MRI LUMBAR 2009  Prior vaccinations: TD or Tdap: 2013  Influenza: 2019 Pneumococcal: 2010 Prevnar13: 2015 Shingles/Zostavax: 2008  Names of Other Physician/Practitioners you currently use: 1.  [facility]- here for primary care 2. [place], eye doctor, last visit 2020 3. Dr. Marland Kitchen  dentist, last visit 2020  Patient  Care Team: Unk Pinto, MD as PCP - General (Internal Medicine) Lafayette Dragon, MD (Inactive) as Consulting Physician (Gastroenterology) Newton Pigg, MD as Consulting Physician (Obstetrics and Gynecology)  Allergies Allergies  Allergen Reactions  . Oxycodone-Acetaminophen Other (See Comments)    panic attack  . Vit C-Cholecalciferol-Rose Hip [Cholecalciferol-Vitamin C] Other (See Comments)    dyspepsia  . Ibuprofen Hives  . Macrodantin [Nitrofurantoin Macrocrystal] Rash    SURGICAL HISTORY She  has a past surgical history that includes right shoulder humerus fx (july 2003); right hand ulner nerve surgery (March 26, 2002); right elbow   surgery (March 26, 2002); right wrist plating (2004); right total hip arthroplasty (2008); right total knee arthroplasty (sept 2012); Tonsillectomy (age 89); Total hip arthroplasty (10/22/2011); Colonoscopy; and Dilation and curettage of uterus. FAMILY HISTORY Her family history includes Atrial fibrillation in her brother, brother, and mother; Breast cancer in her maternal aunt, maternal grandmother, and paternal aunt; Diabetes in her father and mother; Heart attack (age of onset: 84) in her father; Heart disease in her father and mother; Hypertension in her mother. SOCIAL HISTORY She  reports that she has never smoked. She has never used smokeless tobacco. She reports current alcohol use. She reports that she does not use drugs.  MEDICARE WELLNESS OBJECTIVES: Physical activity: Current Exercise Habits: Home exercise routine, Type of exercise: walking;strength training/weights, Time (Minutes): 45, Frequency (Times/Week): 5, Weekly Exercise (Minutes/Week): 225, Intensity: Mild, Exercise limited by: None identified Cardiac risk factors: Cardiac Risk Factors include: hypertension;dyslipidemia;advanced age (>39men, >35 women);family history of premature cardiovascular disease Depression/mood screen:   Depression screen [facility] 2/9 12/22/2018  Decreased Interest  0  Down, Depressed, Hopeless 0  PHQ - 2 Score 0    ADLs:  In your present state of health, do you have any difficulty performing the following activities: 12/22/2018 09/11/2018  Hearing? N N  Vision? N N  Difficulty concentrating or making decisions? N N  Walking or climbing stairs? N N  Dressing or bathing? N N  Doing errands, shopping? N N  Some recent data might be hidden    Cognitive Testing  Alert? Yes  Normal Appearance?Yes  Oriented to person? Yes  Place? Yes   Time? Yes  Recall of three objects?  Yes  Can perform simple calculations? Yes  Displays appropriate judgment?Yes  Can read the correct time from a watch face?Yes  EOL planning: Does Patient Have a Medical Advance Directive?: Yes Type of Advance Directive: Healthcare Power of Attorney, Living will Does patient want to make changes to medical advance directive?: No - Patient declined Copy of [place] in Chart?: No - copy requested   Objective:   Today's Vitals   12/22/18 0954  BP: 128/73  Pulse: 78  Weight: 165 lb (74.8 kg)   Body mass index is 29.7 kg/m.  General : Well sounding patient in no apparent distress HEENT: no  hoarseness, no cough for duration of visit Lungs: speaks in complete sentences, no audible wheezing, no apparent distress Neurological: alert, oriented x 3 Psychiatric: pleasant, judgement appropriate    Medicare Attestation I have personally reviewed: The patient's medical and social history Their use of alcohol, tobacco or illicit drugs Their current medications and supplements The patient's functional ability including ADLs,fall risks, home safety risks, cognitive, and hearing and visual impairment Diet and physical activities Evidence for depression or mood disorders  The patient's weight, height, BMI, and visual acuity have been recorded in the chart.  I have made referrals, counseling, and provided education to the patient based on review of the above and I  have provided the patient with a written personalized care plan for preventive services.     Gwendolyn Ribas, NP   12/22/2018

## 2018-12-22 ENCOUNTER — Ambulatory Visit: Payer: PPO | Admitting: Adult Health

## 2018-12-22 ENCOUNTER — Other Ambulatory Visit: Payer: Self-pay

## 2018-12-22 ENCOUNTER — Encounter: Payer: Self-pay | Admitting: Adult Health

## 2018-12-22 VITALS — BP 128/73 | HR 78 | Wt 165.0 lb

## 2018-12-22 DIAGNOSIS — E782 Mixed hyperlipidemia: Secondary | ICD-10-CM

## 2018-12-22 DIAGNOSIS — E663 Overweight: Secondary | ICD-10-CM

## 2018-12-22 DIAGNOSIS — R7309 Other abnormal glucose: Secondary | ICD-10-CM

## 2018-12-22 DIAGNOSIS — Z0001 Encounter for general adult medical examination with abnormal findings: Secondary | ICD-10-CM

## 2018-12-22 DIAGNOSIS — R6889 Other general symptoms and signs: Secondary | ICD-10-CM | POA: Diagnosis not present

## 2018-12-22 DIAGNOSIS — I10 Essential (primary) hypertension: Secondary | ICD-10-CM

## 2018-12-22 DIAGNOSIS — F411 Generalized anxiety disorder: Secondary | ICD-10-CM | POA: Diagnosis not present

## 2018-12-22 DIAGNOSIS — E559 Vitamin D deficiency, unspecified: Secondary | ICD-10-CM | POA: Diagnosis not present

## 2018-12-22 DIAGNOSIS — Z0000 Encounter for general adult medical examination without abnormal findings: Secondary | ICD-10-CM

## 2018-12-22 DIAGNOSIS — H4010X0 Unspecified open-angle glaucoma, stage unspecified: Secondary | ICD-10-CM

## 2018-12-22 DIAGNOSIS — M47812 Spondylosis without myelopathy or radiculopathy, cervical region: Secondary | ICD-10-CM

## 2018-12-22 DIAGNOSIS — Z79899 Other long term (current) drug therapy: Secondary | ICD-10-CM

## 2019-01-12 DIAGNOSIS — Z961 Presence of intraocular lens: Secondary | ICD-10-CM | POA: Diagnosis not present

## 2019-01-12 DIAGNOSIS — H401232 Low-tension glaucoma, bilateral, moderate stage: Secondary | ICD-10-CM | POA: Diagnosis not present

## 2019-02-09 ENCOUNTER — Other Ambulatory Visit: Payer: Self-pay | Admitting: Internal Medicine

## 2019-02-27 ENCOUNTER — Other Ambulatory Visit: Payer: Self-pay | Admitting: Internal Medicine

## 2019-02-27 MED ORDER — PREDNISONE 20 MG PO TABS: ORAL_TABLET | ORAL | 0 refills | Status: DC

## 2019-03-27 ENCOUNTER — Encounter: Payer: Self-pay | Admitting: Internal Medicine

## 2019-03-27 NOTE — Patient Instructions (Signed)

## 2019-03-27 NOTE — Progress Notes (Signed)
History of Present Illness:      This very nice 78 y.o. [name] presents for 6 month follow up with HTN, HLD, Pre-Diabetes and Vitamin D Deficiency. 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      Patient is treated for HTN (2008) & BP has been controlled at home. Today's BP is at goal - 126/66. Patient has had no complaints of any cardiac type chest pain, palpitations, dyspnea / orthopnea / PND, dizziness, claudication, or dependent edema.      Hyperlipidemia is controlled with diet & meds. Patient denies myalgias or other med SE's. Last Lipids were at goal: Lab Results  Component Value Date   CHOL 174 09/08/2018   HDL 70 09/08/2018   LDLCALC 86 09/08/2018   TRIG 86 09/08/2018   CHOLHDL 2.5 09/08/2018       Also, the patient has history of PreDiabetes (A1c6.0% / 2011)  and has had no symptoms of reactive hypoglycemia, diabetic polys, paresthesias or visual blurring.  Last A1c was Normal and at goal: Lab Results  Component Value Date   HGBA1C 5.6 09/08/2018       Further, the patient also has history of Vitamin D Deficiency and supplements vitamin D without any suspected side-effects. Last vitamin D was at goal: Lab Results  Component Value Date   VD25OH 67 09/08/2018   Current Outpatient Medications on File Prior to Visit  Medication Sig  . ALPRAZolam (XANAX) 0.5 MG tablet TAKE 1/2 TO 1 (ONE-HALF TO ONE) TABLET BY MOUTH THREE TIMES DAILY AS NEEDED FOR ANXIETY  . aspirin EC 81 MG tablet Take 162 mg by mouth at bedtime.  . Cholecalciferol (VITAMIN D) 2000 units CAPS Take 1 capsule by mouth daily.  Marland Kitchen estradiol (ESTRACE) 1 MG tablet TAKE 1 TABLET EVERY DAY  . furosemide (LASIX) 80 MG tablet TAKE ONE TABLET BY MOUTH ONCE DAILY (Patient taking differently: 40 mg as needed. )  . losartan-hydrochlorothiazide (HYZAAR) 50-12.5 MG tablet TAKE 1 TABLET BY MOUTH EVERY DAY  . medroxyPROGESTERone (PROVERA) 2.5 MG tablet TAKE 1 TABLET EVERY DAY  . montelukast (SINGULAIR) 10 MG  tablet TAKE 1 TABLET DAILY FOR ALLERGIES  . Naproxen Sodium (ALEVE) 220 MG CAPS Take by mouth as needed.   . Omega-3 Fatty Acids (FISH OIL) 1000 MG CAPS Take by mouth 2 (two) times daily.  . pregabalin (LYRICA) 150 MG capsule TAKE 1 CAPSULE BY MOUTH THREE TIMES DAILY (Patient taking differently: Takes one tablet M,W,F)  . pyridOXINE (VITAMIN B-6) 100 MG tablet Take 100 mg by mouth 2 (two) times a day.   . rosuvastatin (CRESTOR) 40 MG tablet Take 1/2 to 1 tablet daily or as directed for Cholesterol  . timolol (TIMOPTIC) 0.5 % ophthalmic solution    No current facility-administered medications on file prior to visit.    Allergies  Allergen Reactions  . Oxycodone-Acetaminophen Other (See Comments)    panic attack  . Vit C-Cholecalciferol-Rose Hip [Cholecalciferol-Vitamin C] Other (See Comments)    dyspepsia  . Ibuprofen Hives  . Macrodantin [Nitrofurantoin Macrocrystal] Rash   PMHx:   Past Medical History:  Diagnosis Date  . Allergy   . Anemia   . Arthritis   . Blood transfusion without reported diagnosis   . Cataract    removed both eyes   . Cervical radicular pain 02/11/2016  . Glaucoma    low pressure glaucoma   . Hyperlipidemia   . Hypertension   . Osteopenia   .  Pelvis fracture [facility]) March 26, 2002   both sides fx  . Positive colorectal cancer screening using Cologuard test 04/14/2017  . Pre-diabetes   . 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 Vitamin D deficiency    Immunization History  Administered Date(s) Administered  . Influenza, High Dose Seasonal PF 06/06/2014, 05/14/2016  . Influenza-Unspecified 06/13/2013, 05/02/2015, 06/24/2018  . Pneumococcal Conjugate-13 05/24/2014  . Pneumococcal Polysaccharide-23 01/08/2009  . Tdap 01/28/2012  . Zoster 08/31/2006   Past Surgical History:  Procedure Laterality Date  . COLONOSCOPY    . DILATION AND CURETTAGE OF UTERUS     age 30   . right elbow   surgery  March 26, 2002  . right hand ulner nerve surgery  March 26, 2002   I and d done right hand/wrist  . right shoulder humerus fx  july 2003   surgery done  . right total hip arthroplasty  2008  . right total knee arthroplasty  sept 2012  . right wrist plating  2004  . TONSILLECTOMY  age 29  . TOTAL HIP ARTHROPLASTY  10/22/2011   Procedure: TOTAL HIP ARTHROPLASTY ANTERIOR APPROACH;  Surgeon: Mauri Pole, MD;  Location: [facility];  Service: Orthopedics;  Laterality: Left;  Left Total Hip Arthroplasty,  Anterior Approach    FHx:    Reviewed / unchanged  SHx:    Reviewed / unchanged   Systems Review:  Constitutional: Denies fever, chills, wt changes, headaches, insomnia, fatigue, night sweats, change in appetite. Eyes: Denies redness, blurred vision, diplopia, discharge, itchy, watery eyes.  ENT: Denies discharge, congestion, post nasal drip, epistaxis, sore throat, earache, hearing loss, dental pain, tinnitus, vertigo, sinus pain, snoring.  CV: Denies chest pain, palpitations, irregular heartbeat, syncope, dyspnea, diaphoresis, orthopnea, PND, claudication or edema. Respiratory: denies cough, dyspnea, DOE, pleurisy, hoarseness, laryngitis, wheezing.  Gastrointestinal: Denies dysphagia, odynophagia, heartburn, reflux, water brash, abdominal pain or cramps, nausea, vomiting, bloating, diarrhea, constipation, hematemesis, melena, hematochezia  or hemorrhoids. Genitourinary: Denies dysuria, frequency, urgency, nocturia, hesitancy, discharge, hematuria or flank pain. Musculoskeletal: Denies arthralgias, myalgias, stiffness, jt. swelling, pain, limping or strain/sprain.  Skin: Denies pruritus, rash, hives, warts, acne, eczema or change in skin lesion(s). Neuro: No weakness, tremor, incoordination, spasms, paresthesia or pain. Psychiatric: Denies confusion, memory loss or sensory loss. Endo: Denies change in weight, skin or hair change.  Heme/Lymph: No excessive bleeding, bruising or enlarged lymph nodes.  Physical Exam  BP 126/66   Pulse 76   Temp  (!) 97.1 F (36.2 C)   Resp 16   Ht 5' 2.5" (1.588 m)   Wt 166 lb 6.4 oz (75.5 kg)   BMI 29.95 kg/m   Appears  well nourished, well groomed  and in no distress.  Eyes: PERRLA, EOMs, conjunctiva no swelling or erythema. Sinuses: No frontal/maxillary tenderness ENT/Mouth: EAC's clear, TM's nl w/o erythema, bulging. Nares clear w/o erythema, swelling, exudates. Oropharynx clear without erythema or exudates. Oral hygiene is good. Tongue normal, non obstructing. Hearing intact.  Neck: Supple. Thyroid not palpable. Car 2+/2+ without bruits, nodes or JVD. Chest: Respirations nl with BS clear & equal w/o rales, rhonchi, wheezing or stridor.  Cor: Heart sounds normal w/ regular rate and rhythm without sig. murmurs, gallops, clicks or rubs. Peripheral pulses normal and equal  without edema.  Abdomen: Soft & bowel sounds normal. Non-tender w/o guarding, rebound, hernias, masses or organomegaly.  Lymphatics: Unremarkable.  Musculoskeletal: Full ROM all peripheral extremities, joint stability, 5/5 strength and normal gait.  Skin: Warm, dry without exposed rashes, lesions or ecchymosis apparent.  Neuro: Cranial nerves intact, reflexes equal bilaterally. Sensory-motor testing grossly intact. Tendon reflexes grossly intact.  Pysch: Alert & oriented x 3.  Insight and judgement nl & appropriate. No ideations.  Assessment and Plan:  1. Essential hypertension  - Continue medication, monitor blood pressure at home.  - Continue DASH diet.  Reminder to go to the ER if any CP,  SOB, nausea, dizziness, severe HA, changes vision/speech.  - CBC with Differential/Platelet - COMPLETE METABOLIC PANEL WITH GFR - Magnesium  2. Hyperlipidemia, mixed  - Continue diet/meds, exercise,& lifestyle modifications.  - Continue monitor periodic cholesterol/liver & renal functions   - Lipid panel - TSH  3. Abnormal glucose  - Continue diet, exercise  - Lifestyle modifications.  - Monitor appropriate labs.  -  Hemoglobin A1c - Insulin, random  4. Vitamin D deficiency  - Continue supplementation.  - VITAMIN D 25 Hydroxyl  5. Prediabetes  - Hemoglobin A1c - Insulin, random  6. Medication management  - CBC with Differential/Platelet - COMPLETE METABOLIC PANEL WITH GFR - Magnesium - Lipid panel - TSH - Hemoglobin A1c - Insulin, random - VITAMIN D 25 Hydroxyl        Discussed  regular exercise, BP monitoring, weight control to achieve/maintain BMI less than 25 and discussed med and SE's. Recommended labs to assess and monitor clinical status with further disposition pending results of labs.  I discussed the assessment and treatment plan with the patient. The patient was provided an opportunity to ask questions and all were answered. The patient agreed with the plan and demonstrated an understanding of the instructions.  I provided over 30 minutes of exam, counseling, chart review and  complex critical decision making.  Kirtland Bouchard, MD

## 2019-03-28 ENCOUNTER — Ambulatory Visit (INDEPENDENT_AMBULATORY_CARE_PROVIDER_SITE_OTHER): Payer: PPO | Admitting: Internal Medicine

## 2019-03-28 ENCOUNTER — Other Ambulatory Visit: Payer: Self-pay

## 2019-03-28 VITALS — BP 126/66 | HR 76 | Temp 97.1°F | Resp 16 | Ht 62.5 in | Wt 166.4 lb

## 2019-03-28 DIAGNOSIS — R7309 Other abnormal glucose: Secondary | ICD-10-CM

## 2019-03-28 DIAGNOSIS — Z79899 Other long term (current) drug therapy: Secondary | ICD-10-CM

## 2019-03-28 DIAGNOSIS — E782 Mixed hyperlipidemia: Secondary | ICD-10-CM

## 2019-03-28 DIAGNOSIS — R7303 Prediabetes: Secondary | ICD-10-CM

## 2019-03-28 DIAGNOSIS — I10 Essential (primary) hypertension: Secondary | ICD-10-CM

## 2019-03-28 DIAGNOSIS — E559 Vitamin D deficiency, unspecified: Secondary | ICD-10-CM

## 2019-03-29 LAB — CBC WITH DIFFERENTIAL/PLATELET
Absolute Monocytes: 664 cells/uL (ref 200–950)
Basophils Absolute: 49 cells/uL (ref 0–200)
Basophils Relative: 0.9 %
Eosinophils Absolute: 443 cells/uL (ref 15–500)
Eosinophils Relative: 8.2 %
HCT: 37.8 % (ref 35.0–45.0)
Hemoglobin: 12.7 g/dL (ref 11.7–15.5)
Lymphs Abs: 1588 cells/uL (ref 850–3900)
MCH: 30.6 pg (ref 27.0–33.0)
MCHC: 33.6 g/dL (ref 32.0–36.0)
MCV: 91.1 fL (ref 80.0–100.0)
MPV: 11.9 fL (ref 7.5–12.5)
Monocytes Relative: 12.3 %
Neutro Abs: 2657 cells/uL (ref 1500–7800)
Neutrophils Relative %: 49.2 %
Platelets: 276 10*3/uL (ref 140–400)
RBC: 4.15 10*6/uL (ref 3.80–5.10)
RDW: 13.2 % (ref 11.0–15.0)
Total Lymphocyte: 29.4 %
WBC: 5.4 10*3/uL (ref 3.8–10.8)

## 2019-03-29 LAB — COMPLETE METABOLIC PANEL WITH GFR
AG Ratio: 1.5 (calc) (ref 1.0–2.5)
ALT: 21 U/L (ref 6–29)
AST: 28 U/L (ref 10–35)
Albumin: 4 g/dL (ref 3.6–5.1)
Alkaline phosphatase (APISO): 68 U/L (ref 37–153)
BUN/Creatinine Ratio: 30 (calc) — ABNORMAL HIGH (ref 6–22)
BUN: 27 mg/dL — ABNORMAL HIGH (ref 7–25)
CO2: 29 mmol/L (ref 20–32)
Calcium: 9 mg/dL (ref 8.6–10.4)
Chloride: 101 mmol/L (ref 98–110)
Creat: 0.89 mg/dL (ref 0.60–0.93)
GFR, Est African American: 72 mL/min/{1.73_m2} (ref >=60)
GFR, Est Non African American: 63 mL/min/{1.73_m2} (ref >=60)
Globulin: 2.7 g/dL (calc) (ref 1.9–3.7)
Glucose, Bld: 87 mg/dL (ref 65–99)
Potassium: 4.1 mmol/L (ref 3.5–5.3)
Sodium: 140 mmol/L (ref 135–146)
Total Bilirubin: 0.6 mg/dL (ref 0.2–1.2)
Total Protein: 6.7 g/dL (ref 6.1–8.1)

## 2019-03-29 LAB — LIPID PANEL
Cholesterol: 156 mg/dL (ref <200)
HDL: 58 mg/dL (ref >=50)
LDL Cholesterol (Calc): 82 mg/dL (calc)
Non-HDL Cholesterol (Calc): 98 mg/dL (calc) (ref <130)
Total CHOL/HDL Ratio: 2.7 (calc) (ref <5.0)
Triglycerides: 76 mg/dL (ref <150)

## 2019-03-29 LAB — HEMOGLOBIN A1C
Hgb A1c MFr Bld: 5.6 % of total Hgb (ref <5.7)
Mean Plasma Glucose: 114 (calc)
eAG (mmol/L): 6.3 (calc)

## 2019-03-29 LAB — INSULIN, RANDOM: Insulin: 6.4 u[IU]/mL

## 2019-03-29 LAB — MAGNESIUM: Magnesium: 2.1 mg/dL (ref 1.5–2.5)

## 2019-03-29 LAB — TSH: TSH: 1.46 mIU/L (ref 0.40–4.50)

## 2019-03-29 LAB — VITAMIN D 25 HYDROXY (VIT D DEFICIENCY, FRACTURES): Vit D, 25-Hydroxy: 65 ng/mL (ref 30–100)

## 2019-05-27 ENCOUNTER — Other Ambulatory Visit: Payer: Self-pay | Admitting: Internal Medicine

## 2019-06-15 DIAGNOSIS — H401232 Low-tension glaucoma, bilateral, moderate stage: Secondary | ICD-10-CM | POA: Diagnosis not present

## 2019-06-28 NOTE — Progress Notes (Deleted)
FOLLOW UP  Assessment and Plan:   Hypertension Well controlled with current medications  Monitor blood pressure at home; patient to call if consistently greater than 130/80 Continue DASH diet.   Reminder to go to the ER if any CP, SOB, nausea, dizziness, severe HA, changes vision/speech, left arm numbness and tingling and jaw pain.  Cholesterol Currently above goal; taking crestor 20 mg three times a week as this is what she tolerates - is working on weight loss.  Continue low cholesterol diet and exercise.  Check lipid panel.   Other abnormal glucose Recent A1Cs at goal Continue diet and exercise.  Perform daily foot/skin check, notify office of any concerning changes.  Defer A1C; check BMP  Overweight  Long discussion about weight loss, diet, and exercise Recommended diet heavy in fruits and veggies and low in animal meats, cheeses, and dairy products, appropriate calorie intake Discussed ideal weight for height and weight goal (145-150 lb) Will follow up in 3 months  Vitamin D Def At goal at last visit; continue supplementation to maintain goal of 70-100 Defer Vit D level  Insomnia - good sleep hygiene discussed, increase day time activity, try melatonin or benadryl Uses xanax rarely  Continue diet and meds as discussed. Further disposition pending results of labs. Discussed med's effects and SE's.   Over 30 minutes of exam, counseling, chart review, and critical decision making was performed.   Future Appointments  Date Time Provider [place]  06/29/2019  9:30 AM Vicie Mutters, PA-C [facility] None  10/02/2019 10:00 AM Unk Pinto, MD [facility] None  01/01/2020 10:00 AM Liane Comber, NP [facility] None    ----------------------------------------------------------------------------------------------------------------------  HPI 78 y.o. female  presents for 3 month follow up on hypertension, cholesterol, glucose management, weight, anxiety and  vitamin D deficiency.    she has a diagnosis of anxiety and is currently on xanax 0.25-0.5 mg TID PRN insomnia, reports symptoms are well controlled on current regimen. Not currently on daily agent, reports she only needs very rarely, takes 0.25 mg 1-2 times a month.   BMI is There is no height or weight on file to calculate BMI., she has been working on diet and exercise. She plans to set a goal of 145 lb for this year. She plans to restart her full exercise routine now that she is back from vacation. She continues with weight watchers.  Wt Readings from Last 3 Encounters:  03/28/19 166 lb 6.4 oz (75.5 kg)  12/22/18 165 lb (74.8 kg)  09/08/18 162 lb 9.6 oz (73.8 kg)   Her blood pressure has been controlled at home, today their BP is    She does workout- silver sneakers 3 times weekly, yardwork. She denies chest pain, shortness of breath, dizziness.   She is on cholesterol medication (crestor 20 mg three times weekly ) and denies myalgias. Her cholesterol is at goal. The cholesterol last visit was:   Lab Results  Component Value Date   CHOL 156 03/28/2019   HDL 58 03/28/2019   LDLCALC 82 03/28/2019   TRIG 76 03/28/2019   CHOLHDL 2.7 03/28/2019    She has been working on diet and exercise for glucose management, and denies increased appetite, nausea, paresthesia of the feet, polydipsia, polyuria, visual disturbances, vomiting and weight loss. Last A1C in the office was:  Lab Results  Component Value Date   HGBA1C 5.6 03/28/2019   Patient is on Vitamin D supplement and at goal at most recent check:   Lab Results  Component Value  Date   VD25OH 12 03/28/2019       Current Medications:  Current Outpatient Medications on File Prior to Visit  Medication Sig  . ALPRAZolam (XANAX) 0.5 MG tablet TAKE 1/2 TO 1 (ONE-HALF TO ONE) TABLET BY MOUTH THREE TIMES DAILY AS NEEDED FOR ANXIETY  . aspirin EC 81 MG tablet Take 162 mg by mouth at bedtime.  . Cholecalciferol (VITAMIN D) 2000 units CAPS  Take 1 capsule by mouth daily.  Marland Kitchen estradiol (ESTRACE) 1 MG tablet TAKE 1 TABLET EVERY DAY  . furosemide (LASIX) 80 MG tablet TAKE ONE TABLET BY MOUTH ONCE DAILY (Patient taking differently: 40 mg as needed. )  . losartan-hydrochlorothiazide (HYZAAR) 50-12.5 MG tablet TAKE 1 TABLET BY MOUTH EVERY DAY  . medroxyPROGESTERone (PROVERA) 2.5 MG tablet TAKE 1 TABLET EVERY DAY  . montelukast (SINGULAIR) 10 MG tablet TAKE 1 TABLET DAILY FOR ALLERGIES  . Naproxen Sodium (ALEVE) 220 MG CAPS Take by mouth as needed.   . Omega-3 Fatty Acids (FISH OIL) 1000 MG CAPS Take by mouth 2 (two) times daily.  . pregabalin (LYRICA) 150 MG capsule TAKE 1 CAPSULE BY MOUTH THREE TIMES DAILY (Patient taking differently: Takes one tablet M,W,F)  . pyridOXINE (VITAMIN B-6) 100 MG tablet Take 100 mg by mouth 2 (two) times a day.   . rosuvastatin (CRESTOR) 40 MG tablet Take 1/2 to 1 tablet Daily for Cholesterol  . timolol (TIMOPTIC) 0.5 % ophthalmic solution    No current facility-administered medications on file prior to visit.      Allergies:  Allergies  Allergen Reactions  . Oxycodone-Acetaminophen Other (See Comments)    panic attack  . Vit C-Cholecalciferol-Rose Hip [Cholecalciferol-Vitamin C] Other (See Comments)    dyspepsia  . Ibuprofen Hives  . Macrodantin [Nitrofurantoin Macrocrystal] Rash     Medical History:  Past Medical History:  Diagnosis Date  . Allergy   . Anemia   . Arthritis   . Blood transfusion without reported diagnosis   . Cataract    removed both eyes   . Cervical radicular pain 02/11/2016  . Glaucoma    low pressure glaucoma   . Hyperlipidemia   . Hypertension   . Osteopenia   . Pelvis fracture [facility]) March 26, 2002   both sides fx  . Positive colorectal cancer screening using Cologuard test 04/14/2017  . Pre-diabetes   . 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 Vitamin D deficiency    Family history- Reviewed and unchanged Social history- Reviewed and  unchanged   Review of Systems:  Review of Systems  Constitutional: Negative for malaise/fatigue and weight loss.  HENT: Negative for hearing loss and tinnitus.   Eyes: Negative for blurred vision and double vision.  Respiratory: Negative for cough, shortness of breath and wheezing.   Cardiovascular: Negative for chest pain, palpitations, orthopnea, claudication and leg swelling.  Gastrointestinal: Negative for abdominal pain, blood in stool, constipation, diarrhea, heartburn, melena, nausea and vomiting.  Genitourinary: Negative.   Musculoskeletal: Negative for joint pain and myalgias.  Skin: Negative for rash.  Neurological: Negative for dizziness, tingling, sensory change, weakness and headaches.  Endo/Heme/Allergies: Negative for polydipsia.  Psychiatric/Behavioral: Negative.   All other systems reviewed and are negative.    Physical Exam: There were no vitals taken for this visit. Wt Readings from Last 3 Encounters:  03/28/19 166 lb 6.4 oz (75.5 kg)  12/22/18 165 lb (74.8 kg)  09/08/18 162 lb 9.6 oz (73.8 kg)   General Appearance: Well nourished,  in no apparent distress. Eyes: PERRLA, EOMs, conjunctiva no swelling or erythema Sinuses: No Frontal/maxillary tenderness ENT/Mouth: Ext aud canals clear, TMs without erythema, bulging. No erythema, swelling, or exudate on post pharynx.  Tonsils not swollen or erythematous. Hearing normal.  Neck: Supple, thyroid normal.  Respiratory: Respiratory effort normal, BS equal bilaterally without rales, rhonchi, wheezing or stridor.  Cardio: RRR with no MRGs. Brisk peripheral pulses without edema.  Abdomen: Soft, + BS.  Non tender, no guarding, rebound, hernias, masses. Lymphatics: Non tender without lymphadenopathy.  Musculoskeletal: Full ROM, 5/5 strength, Normal gait Skin: Warm, dry without rashes, lesions, ecchymosis.  Neuro: Cranial nerves intact. No cerebellar symptoms.  Psych: Awake and oriented X 3, normal affect, Insight and  Judgment appropriate.    Vicie Mutters, PA-C 8:31 AM [facility]

## 2019-06-29 ENCOUNTER — Ambulatory Visit: Payer: PPO | Admitting: Physician Assistant

## 2019-07-10 DIAGNOSIS — H16103 Unspecified superficial keratitis, bilateral: Secondary | ICD-10-CM | POA: Diagnosis not present

## 2019-07-10 DIAGNOSIS — H04123 Dry eye syndrome of bilateral lacrimal glands: Secondary | ICD-10-CM | POA: Diagnosis not present

## 2019-07-10 DIAGNOSIS — H5211 Myopia, right eye: Secondary | ICD-10-CM | POA: Diagnosis not present

## 2019-07-10 DIAGNOSIS — H401232 Low-tension glaucoma, bilateral, moderate stage: Secondary | ICD-10-CM | POA: Diagnosis not present

## 2019-07-10 NOTE — Progress Notes (Signed)
FOLLOW UP  Assessment and Plan:   Hypertension Well controlled with current medications  Monitor blood pressure at home; patient to call if consistently greater than 130/80 Continue DASH diet.   Reminder to go to the ER if any CP, SOB, nausea, dizziness, severe HA, changes vision/speech, left arm numbness and tingling and jaw pain.  Cholesterol Currently above goal; taking crestor 20 mg three times a week  Continue low cholesterol diet and exercise.  Check lipid panel.   Other abnormal glucose Recent A1Cs at goal Continue diet and exercise.  Perform daily foot/skin check, notify office of any concerning changes.  Defer A1C; check BMP  Overweight  Long discussion about weight loss, diet, and exercise Recommended diet heavy in fruits and veggies and low in animal meats, cheeses, and dairy products, appropriate calorie intake Discussed ideal weight for height and weight goal (145-150 lb) Will follow up in 3 months  Vitamin D Def At goal at last visit; continue supplementation to maintain goal of 70-100 Defer Vit D level   Continue diet and meds as discussed. Further disposition pending results of labs. Discussed med's effects and SE's.   Over 30 minutes of exam, counseling, chart review, and critical decision making was performed.   Future Appointments  Date Time Provider [place]  10/02/2019 10:00 AM Unk Pinto, MD [facility] None  01/01/2020 10:00 AM Liane Comber, NP [facility] None    ----------------------------------------------------------------------------------------------------------------------  HPI 78 y.o. female  presents for 3 month follow up on hypertension, cholesterol, glucose management, weight, anxiety and vitamin D deficiency.    she has a diagnosis of anxiety and is currently on xanax 0.25-0.5 mg TID PRN insomnia, reports symptoms are well controlled on current regimen. Not currently on daily agent, reports she only needs very rarely,  takes 0.25 mg 1-2 times a month.   BMI is Body mass index is 29.91 kg/m., she has been working on diet and exercise. Wt Readings from Last 3 Encounters:  07/11/19 166 lb 3.2 oz (75.4 kg)  03/28/19 166 lb 6.4 oz (75.5 kg)  12/22/18 165 lb (74.8 kg)   Her blood pressure has been controlled at home, today their BP is BP: 120/76  She does workout-has not been able to workout as much due to taking care of her boyfriend/significant other that is 74, she is doing yardwork. She denies chest pain, shortness of breath, dizziness.   She is on cholesterol medication (crestor 20 mg three times weekly ) and denies myalgias. Her cholesterol is at goal. The cholesterol last visit was:   Lab Results  Component Value Date   CHOL 156 03/28/2019   HDL 58 03/28/2019   LDLCALC 82 03/28/2019   TRIG 76 03/28/2019   CHOLHDL 2.7 03/28/2019    She has been working on diet and exercise for glucose management, and denies increased appetite, nausea, paresthesia of the feet, polydipsia, polyuria, visual disturbances, vomiting and weight loss. Last A1C in the office was:  Lab Results  Component Value Date   HGBA1C 5.6 03/28/2019   Patient is on Vitamin D supplement and at goal at most recent check:   Lab Results  Component Value Date   VD25OH 65 03/28/2019       Current Medications:  Current Outpatient Medications on File Prior to Visit  Medication Sig  . ALPRAZolam (XANAX) 0.5 MG tablet TAKE 1/2 TO 1 (ONE-HALF TO ONE) TABLET BY MOUTH THREE TIMES DAILY AS NEEDED FOR ANXIETY  . aspirin EC 81 MG tablet Take 162 mg by  mouth at bedtime.  . Cholecalciferol (VITAMIN D) 2000 units CAPS Take 1 capsule by mouth daily.  Marland Kitchen estradiol (ESTRACE) 1 MG tablet TAKE 1 TABLET EVERY DAY  . furosemide (LASIX) 80 MG tablet TAKE ONE TABLET BY MOUTH ONCE DAILY (Patient taking differently: 40 mg as needed. )  . losartan-hydrochlorothiazide (HYZAAR) 50-12.5 MG tablet TAKE 1 TABLET BY MOUTH EVERY DAY  . medroxyPROGESTERone (PROVERA)  2.5 MG tablet TAKE 1 TABLET EVERY DAY  . montelukast (SINGULAIR) 10 MG tablet TAKE 1 TABLET DAILY FOR ALLERGIES  . Naproxen Sodium (ALEVE) 220 MG CAPS Take by mouth as needed.   . Omega-3 Fatty Acids (FISH OIL) 1000 MG CAPS Take by mouth 2 (two) times daily.  . pregabalin (LYRICA) 150 MG capsule TAKE 1 CAPSULE BY MOUTH THREE TIMES DAILY (Patient taking differently: Takes one tablet M,W,F)  . pyridOXINE (VITAMIN B-6) 100 MG tablet Take 100 mg by mouth 2 (two) times a day.   . rosuvastatin (CRESTOR) 40 MG tablet Take 1/2 to 1 tablet Daily for Cholesterol  . timolol (TIMOPTIC) 0.5 % ophthalmic solution    No current facility-administered medications on file prior to visit.      Allergies:  Allergies  Allergen Reactions  . Oxycodone-Acetaminophen Other (See Comments)    panic attack  . Vit C-Cholecalciferol-Rose Hip [Cholecalciferol-Vitamin C] Other (See Comments)    dyspepsia  . Ibuprofen Hives  . Macrodantin [Nitrofurantoin Macrocrystal] Rash     Medical History:  Past Medical History:  Diagnosis Date  . Allergy   . Anemia   . Arthritis   . Blood transfusion without reported diagnosis   . Cataract    removed both eyes   . Cervical radicular pain 02/11/2016  . Glaucoma    low pressure glaucoma   . Hyperlipidemia   . Hypertension   . Osteopenia   . Pelvis fracture [facility]) March 26, 2002   both sides fx  . Positive colorectal cancer screening using Cologuard test 04/14/2017  . Pre-diabetes   . 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 Vitamin D deficiency    Family history- Reviewed and unchanged Social history- Reviewed and unchanged   Review of Systems:  Review of Systems  Constitutional: Negative for malaise/fatigue and weight loss.  HENT: Negative for hearing loss and tinnitus.   Eyes: Negative for blurred vision and double vision.  Respiratory: Negative for cough, shortness of breath and wheezing.   Cardiovascular: Negative for chest pain, palpitations,  orthopnea, claudication and leg swelling.  Gastrointestinal: Negative for abdominal pain, blood in stool, constipation, diarrhea, heartburn, melena, nausea and vomiting.  Genitourinary: Negative.   Musculoskeletal: Negative for joint pain and myalgias.  Skin: Negative for rash.  Neurological: Negative for dizziness, tingling, sensory change, weakness and headaches.  Endo/Heme/Allergies: Negative for polydipsia.  Psychiatric/Behavioral: Negative.   All other systems reviewed and are negative.    Physical Exam: BP 120/76   Pulse 60   Temp 97.6 F (36.4 C)   Ht 5' 2.5" (1.588 m)   Wt 166 lb 3.2 oz (75.4 kg)   SpO2 97%   BMI 29.91 kg/m  Wt Readings from Last 3 Encounters:  07/11/19 166 lb 3.2 oz (75.4 kg)  03/28/19 166 lb 6.4 oz (75.5 kg)  12/22/18 165 lb (74.8 kg)   General Appearance: Well nourished, in no apparent distress. Eyes: PERRLA, EOMs, conjunctiva no swelling or erythema Sinuses: No Frontal/maxillary tenderness ENT/Mouth: Ext aud canals clear, TMs without erythema, bulging. No erythema, swelling, or exudate  on post pharynx.  Tonsils not swollen or erythematous. Hearing normal.  Neck: Supple, thyroid normal.  Respiratory: Respiratory effort normal, BS equal bilaterally without rales, rhonchi, wheezing or stridor.  Cardio: RRR with no MRGs. Brisk peripheral pulses without edema.  Abdomen: Soft, + BS.  Non tender, no guarding, rebound, hernias, masses. Lymphatics: Non tender without lymphadenopathy.  Musculoskeletal: Full ROM, 5/5 strength, Normal gait Skin: Warm, dry without rashes, lesions, ecchymosis.  Neuro: Cranial nerves intact. No cerebellar symptoms.  Psych: Awake and oriented X 3, normal affect, Insight and Judgment appropriate.    Vicie Mutters, PA-C 9:37 AM [facility]

## 2019-07-11 ENCOUNTER — Encounter: Payer: Self-pay | Admitting: Physician Assistant

## 2019-07-11 ENCOUNTER — Other Ambulatory Visit: Payer: Self-pay

## 2019-07-11 ENCOUNTER — Ambulatory Visit (INDEPENDENT_AMBULATORY_CARE_PROVIDER_SITE_OTHER): Payer: PPO | Admitting: Physician Assistant

## 2019-07-11 VITALS — BP 120/76 | HR 60 | Temp 97.6°F | Ht 62.5 in | Wt 166.2 lb

## 2019-07-11 DIAGNOSIS — Z79899 Other long term (current) drug therapy: Secondary | ICD-10-CM

## 2019-07-11 DIAGNOSIS — E559 Vitamin D deficiency, unspecified: Secondary | ICD-10-CM

## 2019-07-11 DIAGNOSIS — F411 Generalized anxiety disorder: Secondary | ICD-10-CM

## 2019-07-11 DIAGNOSIS — R7309 Other abnormal glucose: Secondary | ICD-10-CM | POA: Diagnosis not present

## 2019-07-11 DIAGNOSIS — E782 Mixed hyperlipidemia: Secondary | ICD-10-CM | POA: Diagnosis not present

## 2019-07-11 DIAGNOSIS — E663 Overweight: Secondary | ICD-10-CM | POA: Diagnosis not present

## 2019-07-11 DIAGNOSIS — I10 Essential (primary) hypertension: Secondary | ICD-10-CM

## 2019-07-11 MED ORDER — FUROSEMIDE 40 MG PO TABS: 40.0000 mg | ORAL_TABLET | ORAL | 3 refills | Status: AC | PRN

## 2019-07-11 NOTE — Patient Instructions (Addendum)
Use a dropper or use a cap to put peroxide, olive oil,mineral oil or canola oil in the effected ear- 2-3 times a week. Let it soak for 20-30 min then you can take a shower or use a baby bulb with warm water to wash out the ear wax.  Can buy debrox wax removal kit over the counter.  Do not use Qtips  VENOUS INSUFFICIENCY Our lower leg venous system is not the most reliable, the heart does NOT pump fluid up, there is a valve system.  The muscles of the leg squeeze and the blood moves up and a valve opens and close, then they squeeze, blood moves up and valves open and closes keeping the blood moving towards the heart.  Lots can go wrong with this valve system.  If someone is sitting or standing without movement, everyone will get swelling.  THINGS TO DO:  Do not stand or sit in one position for long periods of time. Do not sit with your legs crossed. Rest with your legs raised during the day.  Your legs have to be higher than your heart so that gravity will force the valves to open, so please really elevate your legs.   Wear elastic stockings or support hose. Do not wear other tight, encircling garments around the legs, pelvis, or waist.  [employer]  has a wide variety of well priced compression stockings. [place], [place] [address] [id number] [place] has a good cheap selection, I like the socks, they are not as hard to get on  Walk as much as possible to increase blood flow.  Raise the foot of your bed at night with 2-inch blocks.  SEEK MEDICAL CARE IF:   The skin around your ankle starts to break down.  You have pain, redness, tenderness, or hard swelling developing in your leg over a vein.  You are uncomfortable due to leg pain.  If you ever have shortness of breath with exertion or chest pain go to the ER.    Ask insurance and pharmacy about shingrix - new vaccine   Can go to [employer]Genu[employer].com.br for  more information  Shingrix Vaccination  Two vaccines are licensed and recommended to prevent shingles in the [place] Zoster vaccine live (ZVL, Zostavax) has been in use since 2006. Recombinant zoster vaccine (RZV, Shingrix), has been in use since 2017 and is recommended by ACIP as the preferred shingles vaccine.  What Everyone Should Know about Shingles Vaccine (Shingrix) One of the Recommended Vaccines by Disease Shingles vaccination is the only way to protect against shingles and postherpetic neuralgia (PHN), the most common complication from shingles. CDC recommends that healthy adults 50 years and older get two doses of the shingles vaccine called Shingrix (recombinant zoster vaccine), separated by 2 to 6 months, to prevent shingles and the complications from the disease. Your doctor or pharmacist can give you Shingrix as a shot in your upper arm. Shingrix provides strong protection against shingles and PHN. Two doses of Shingrix is more than 90% effective at preventing shingles and PHN. Protection stays above 85% for at least the first four years after you get vaccinated. Shingrix is the preferred vaccine, over Zostavax (zoster vaccine live), a shingles vaccine in use since 2006. Zostavax may still be used to prevent shingles in healthy adults 60 years and older. For example, you could use Zostavax if a person is allergic to Shingrix, prefers Zostavax, or requests immediate vaccination and Shingrix is unavailable. Who Should  Get Shingrix? Healthy adults 50 years and older should get two doses of Shingrix, separated by 2 to 6 months. You should get Shingrix even if in the past you . had shingles  . received Zostavax  . are not sure if you had chickenpox There is no maximum age for getting Shingrix. If you had shingles in the past, you can get Shingrix to help prevent future occurrences of the disease. There is no specific length of time that you need to wait after having shingles before you can  receive Shingrix, but generally you should make sure the shingles rash has gone away before getting vaccinated. You can get Shingrix whether or not you remember having had chickenpox in the past. Studies show that more than 99% of Americans 40 years and older have had chickenpox, even if they don't remember having the disease. Chickenpox and shingles are related because they are caused by the same virus (varicella zoster virus). After a person recovers from chickenpox, the virus stays dormant (inactive) in the body. It can reactivate years later and cause shingles. If you had Zostavax in the recent past, you should wait at least eight weeks before getting Shingrix. Talk to your healthcare provider to determine the best time to get Shingrix. Shingrix is available in [facility] and pharmacies. To find doctor's offices or pharmacies near you that offer the vaccine, visit HealthMap Vaccine FinderExternal. If you have questions about Shingrix, talk with your healthcare provider. Vaccine for Those 46 Years and Older  Shingrix reduces the risk of shingles and PHN by more than 90% in people 94 and older. CDC recommends the vaccine for healthy adults 90 and older.  Who Should Not Get Shingrix? You should not get Shingrix if you: . have ever had a severe allergic reaction to any component of the vaccine or after a dose of Shingrix  . tested negative for immunity to varicella zoster virus. If you test negative, you should get chickenpox vaccine.  . currently have shingles  . currently are pregnant or breastfeeding. Women who are pregnant or breastfeeding should wait to get Shingrix.  Marland Kitchen receive specific antiviral drugs (acyclovir, famciclovir, or valacyclovir) 24 hours before vaccination (avoid use of these antiviral drugs for 14 days after vaccination)- zoster vaccine live only If you have a minor acute (starts suddenly) illness, such as a cold, you may get Shingrix. But if you have a moderate or severe  acute illness, you should usually wait until you recover before getting the vaccine. This includes anyone with a temperature of 101.51F or higher. The side effects of the Shingrix are temporary, and usually last 2 to 3 days. While you may experience pain for a few days after getting Shingrix, the pain will be less severe than having shingles and the complications from the disease. How Well Does Shingrix Work? Two doses of Shingrix provides strong protection against shingles and postherpetic neuralgia (PHN), the most common complication of shingles. . In adults 52 to 77 years old who got two doses, Shingrix was 97% effective in preventing shingles; among adults 70 years and older, Shingrix was 91% effective.  . In adults 21 to 77 years old who got two doses, Shingrix was 91% effective in preventing PHN; among adults 70 years and older, Shingrix was 89% effective. Shingrix protection remained high (more than 85%) in people 70 years and older throughout the four years following vaccination. Since your risk of shingles and PHN increases as you get older, it is important to have  strong protection against shingles in your older years. Top of Page  What Are the Possible Side Effects of Shingrix? Studies show that Shingrix is safe. The vaccine helps your body create a strong defense against shingles. As a result, you are likely to have temporary side effects from getting the shots. The side effects may affect your ability to do normal daily activities for 2 to 3 days. Most people got a sore arm with mild or moderate pain after getting Shingrix, and some also had redness and swelling where they got the shot. Some people felt tired, had muscle pain, a headache, shivering, fever, stomach pain, or nausea. About 1 out of 6 people who got Shingrix experienced side effects that prevented them from doing regular activities. Symptoms went away on their own in about 2 to 3 days. Side effects were more common in younger  people. You might have a reaction to the first or second dose of Shingrix, or both doses. If you experience side effects, you may choose to take over-the-counter pain medicine such as ibuprofen or acetaminophen. If you experience side effects from Shingrix, you should report them to the Vaccine Adverse Event Reporting System (VAERS). Your doctor might file this report, or you can do it yourself through the VAERS websiteExternal, or by calling [phone]. If you have any questions about side effects from Shingrix, talk with your doctor. The shingles vaccine does not contain thimerosal (a preservative containing mercury). Top of Page  When Should I See a Doctor Because of the Side Effects I Experience From Shingrix? In clinical trials, Shingrix was not associated with serious adverse events. In fact, serious side effects from vaccines are extremely rare. For example, for every 1 million doses of a vaccine given, only one or two people may have a severe allergic reaction. Signs of an allergic reaction happen within minutes or hours after vaccination and include hives, swelling of the face and throat, difficulty breathing, a fast heartbeat, dizziness, or weakness. If you experience these or any other life-threatening symptoms, see a doctor right away. Shingrix causes a strong response in your immune system, so it may produce short-term side effects more intense than you are used to from other vaccines. These side effects can be uncomfortable, but they are expected and usually go away on their own in 2 or 3 days. Top of Page  How Can I Pay For Shingrix? There are several ways shingles vaccine may be paid for: Medicare . Medicare Part D plans cover the shingles vaccine, but there may be a cost to you depending on your plan. There may be a copay for the vaccine, or you may need to pay in full then get reimbursed for a certain amount.  . Medicare Part B does not cover the shingles vaccine. Medicaid .  Medicaid may or may not cover the vaccine. Contact your insurer to find out. Private health insurance . Many private health insurance plans will cover the vaccine, but there may be a cost to you depending on your plan. Contact your insurer to find out. Vaccine assistance programs . Some pharmaceutical companies provide vaccines to eligible adults who cannot afford them. You may want to check with the vaccine manufacturer, [employer], about Shingrix. If you do not currently have health insurance, learn more about affordable health coverage optionsExternal. To find doctor's offices or pharmacies near you that offer the vaccine, visit HealthMap Vaccine FinderExternal.

## 2019-07-12 LAB — CBC WITH DIFFERENTIAL/PLATELET
Absolute Monocytes: 525 cells/uL (ref 200–950)
Basophils Absolute: 51 cells/uL (ref 0–200)
Basophils Relative: 1 %
Eosinophils Absolute: 143 cells/uL (ref 15–500)
Eosinophils Relative: 2.8 %
HCT: 39.7 % (ref 35.0–45.0)
Hemoglobin: 13.1 g/dL (ref 11.7–15.5)
Lymphs Abs: 1525 cells/uL (ref 850–3900)
MCH: 30.5 pg (ref 27.0–33.0)
MCHC: 33 g/dL (ref 32.0–36.0)
MCV: 92.3 fL (ref 80.0–100.0)
MPV: 11.9 fL (ref 7.5–12.5)
Monocytes Relative: 10.3 %
Neutro Abs: 2856 cells/uL (ref 1500–7800)
Neutrophils Relative %: 56 %
Platelets: 267 10*3/uL (ref 140–400)
RBC: 4.3 10*6/uL (ref 3.80–5.10)
RDW: 13.1 % (ref 11.0–15.0)
Total Lymphocyte: 29.9 %
WBC: 5.1 10*3/uL (ref 3.8–10.8)

## 2019-07-12 LAB — COMPLETE METABOLIC PANEL WITH GFR
AG Ratio: 1.8 (calc) (ref 1.0–2.5)
ALT: 28 U/L (ref 6–29)
AST: 30 U/L (ref 10–35)
Albumin: 4.2 g/dL (ref 3.6–5.1)
Alkaline phosphatase (APISO): 64 U/L (ref 37–153)
BUN/Creatinine Ratio: 34 (calc) — ABNORMAL HIGH (ref 6–22)
BUN: 27 mg/dL — ABNORMAL HIGH (ref 7–25)
CO2: 26 mmol/L (ref 20–32)
Calcium: 9.2 mg/dL (ref 8.6–10.4)
Chloride: 105 mmol/L (ref 98–110)
Creat: 0.79 mg/dL (ref 0.60–0.93)
GFR, Est African American: 84 mL/min/{1.73_m2} (ref >=60)
GFR, Est Non African American: 72 mL/min/{1.73_m2} (ref >=60)
Globulin: 2.3 g/dL (calc) (ref 1.9–3.7)
Glucose, Bld: 91 mg/dL (ref 65–99)
Potassium: 4.3 mmol/L (ref 3.5–5.3)
Sodium: 140 mmol/L (ref 135–146)
Total Bilirubin: 0.5 mg/dL (ref 0.2–1.2)
Total Protein: 6.5 g/dL (ref 6.1–8.1)

## 2019-07-12 LAB — MAGNESIUM: Magnesium: 2 mg/dL (ref 1.5–2.5)

## 2019-07-12 LAB — LIPID PANEL
Cholesterol: 148 mg/dL (ref <200)
HDL: 59 mg/dL (ref >=50)
LDL Cholesterol (Calc): 75 mg/dL (calc)
Non-HDL Cholesterol (Calc): 89 mg/dL (calc) (ref <130)
Total CHOL/HDL Ratio: 2.5 (calc) (ref <5.0)
Triglycerides: 64 mg/dL (ref <150)

## 2019-07-12 LAB — TSH: TSH: 1.48 mIU/L (ref 0.40–4.50)

## 2019-08-14 DIAGNOSIS — H02051 Trichiasis without entropian right upper eyelid: Secondary | ICD-10-CM | POA: Diagnosis not present

## 2019-08-14 DIAGNOSIS — H16103 Unspecified superficial keratitis, bilateral: Secondary | ICD-10-CM | POA: Diagnosis not present

## 2019-08-14 DIAGNOSIS — H04123 Dry eye syndrome of bilateral lacrimal glands: Secondary | ICD-10-CM | POA: Diagnosis not present

## 2019-08-14 DIAGNOSIS — H401232 Low-tension glaucoma, bilateral, moderate stage: Secondary | ICD-10-CM | POA: Diagnosis not present

## 2019-08-16 ENCOUNTER — Other Ambulatory Visit: Payer: Self-pay | Admitting: Internal Medicine

## 2019-08-16 DIAGNOSIS — G90519 Complex regional pain syndrome I of unspecified upper limb: Secondary | ICD-10-CM

## 2019-08-16 MED ORDER — PREGABALIN 150 MG PO CAPS: ORAL_CAPSULE | ORAL | 1 refills | Status: DC

## 2019-08-16 MED ORDER — PREGABALIN 100 MG PO CAPS: ORAL_CAPSULE | ORAL | 1 refills | Status: DC

## 2019-08-17 ENCOUNTER — Other Ambulatory Visit: Payer: Self-pay | Admitting: Internal Medicine

## 2019-08-17 DIAGNOSIS — G90519 Complex regional pain syndrome I of unspecified upper limb: Secondary | ICD-10-CM

## 2019-08-17 MED ORDER — PREGABALIN 150 MG PO CAPS: ORAL_CAPSULE | ORAL | 1 refills | Status: DC

## 2019-09-13 ENCOUNTER — Ambulatory Visit (INDEPENDENT_AMBULATORY_CARE_PROVIDER_SITE_OTHER): Payer: PPO | Admitting: Adult Health

## 2019-09-13 ENCOUNTER — Encounter: Payer: Self-pay | Admitting: Adult Health

## 2019-09-13 ENCOUNTER — Other Ambulatory Visit: Payer: Self-pay

## 2019-09-13 VITALS — BP 136/64 | HR 67 | Temp 97.2°F | Wt 172.8 lb

## 2019-09-13 DIAGNOSIS — L309 Dermatitis, unspecified: Secondary | ICD-10-CM

## 2019-09-13 MED ORDER — TRIAMCINOLONE ACETONIDE 0.1 % EX OINT: 1.0000 "application " | TOPICAL_OINTMENT | Freq: Two times a day (BID) | CUTANEOUS | 1 refills | Status: DC

## 2019-09-13 NOTE — Progress Notes (Signed)
Assessment and Plan:  Diagnoses and all orders for this visit:  Dermatitis Exam suggestive of atopic dermatitis primary, ? Element of contact dermatitis at neck;   - moisturizing cream, triamcinolone  - reviewed avoiding scented creams, cosmetics, detergents; continue to avoid necklace - suggested avoiding very hot showers, moisturize (eucerin, aveeno, lanolin products suggested) - triamcinolone BID except on face PRN; use sparingly  -Stop topical antibiotic which may be drying/irritating; not suggestive of bacterial infection at this time; monitor worsening redness, heat, purulent discharge, doxycycline PRN Follow up if not significantly improving in 2 weeks or with any new concerns -     triamcinolone ointment (KENALOG) 0.1 %; Apply 1 application topically 2 (two) times daily.  Further disposition pending results of labs. Discussed med's effects and SE's.   Over 30 minutes of exam, counseling, chart review, and critical decision making was performed.   Future Appointments  Date Time Provider [place]  10/18/2019 10:00 AM Unk Pinto, MD [facility] None  01/16/2020  9:00 AM Liane Comber, NP [facility] None    ------------------------------------------------------------------------------------------------------------------   HPI BP 136/64   Pulse 67   Temp (!) 97.2 F (36.2 C)   Wt 172 lb 12.8 oz (78.4 kg)   BMI 31.10 kg/m    Gwendolyn [name] is a 79 y.o. female who presents for evaluation of rash which has been gradual - initially had cracking/scaling to R side of mouth down to chin 6 weeks ago which has remained red/scaly, has been applying eucerin cream and neosporin, has been improving gradually.   She also noted large area to left side of neck, erythematous area, removed a necklace she had been wearing and applying eucerine ointment.   She has also noted dry/scaly areas above bilateral eyes, and now behind both knees, new area under R abdominal  fold.   She denies any new detergents, lotions, cosmetics, medications, atypical foods. NO other accompanying sx  Past Medical History:  Diagnosis Date  . Allergy   . Anemia   . Arthritis   . Blood transfusion without reported diagnosis   . Cataract    removed both eyes   . Cervical radicular pain 02/11/2016  . Glaucoma    low pressure glaucoma   . Hyperlipidemia   . Hypertension   . Osteopenia   . Pelvis fracture [facility]) March 26, 2002   both sides fx  . Positive colorectal cancer screening using Cologuard test 04/14/2017  . Pre-diabetes   . 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 Vitamin D deficiency      Allergies  Allergen Reactions  . Oxycodone-Acetaminophen Other (See Comments)    panic attack  . Vit C-Cholecalciferol-Rose Hip [Cholecalciferol-Vitamin C] Other (See Comments)    dyspepsia  . Ibuprofen Hives  . Macrodantin [Nitrofurantoin Macrocrystal] Rash    Current Outpatient Medications on File Prior to Visit  Medication Sig  . ALPRAZolam (XANAX) 0.5 MG tablet TAKE 1/2 TO 1 (ONE-HALF TO ONE) TABLET BY MOUTH THREE TIMES DAILY AS NEEDED FOR ANXIETY  . aspirin EC 81 MG tablet Take 162 mg by mouth at bedtime.  . Cholecalciferol (VITAMIN D) 2000 units CAPS Take 1 capsule by mouth daily.  Marland Kitchen estradiol (ESTRACE) 1 MG tablet TAKE 1 TABLET EVERY DAY  . furosemide (LASIX) 40 MG tablet Take 1 tablet (40 mg total) by mouth as needed for edema.  Marland Kitchen losartan-hydrochlorothiazide (HYZAAR) 50-12.5 MG tablet TAKE 1 TABLET BY MOUTH EVERY DAY  . medroxyPROGESTERone (PROVERA) 2.5 MG tablet TAKE  1 TABLET EVERY DAY  . montelukast (SINGULAIR) 10 MG tablet TAKE 1 TABLET DAILY FOR ALLERGIES  . Naproxen Sodium (ALEVE) 220 MG CAPS Take by mouth as needed.   . Omega-3 Fatty Acids (FISH OIL) 1000 MG CAPS Take by mouth 2 (two) times daily.  . pregabalin (LYRICA) 150 MG capsule Take 1 capsule at Bedtime for Chronic Pain  . pyridOXINE (VITAMIN B-6) 100 MG tablet Take 100 mg by mouth 2 (two)  times a day.   . rosuvastatin (CRESTOR) 40 MG tablet Take 1/2 to 1 tablet Daily for Cholesterol  . timolol (TIMOPTIC) 0.5 % ophthalmic solution    No current facility-administered medications on file prior to visit.    ROS: Review of Systems  Constitutional: Negative for chills, diaphoresis, fever, malaise/fatigue and weight loss.  HENT: Negative.   Eyes: Negative.   Respiratory: Negative.   Cardiovascular: Negative.   Gastrointestinal: Negative.   Genitourinary: Negative.   Musculoskeletal: Negative.   Skin: Positive for itching and rash.  Neurological: Negative.   Endo/Heme/Allergies: Negative.     Physical Exam:  BP 136/64   Pulse 67   Temp (!) 97.2 F (36.2 C)   Wt 172 lb 12.8 oz (78.4 kg)   BMI 31.10 kg/m   General Appearance: Well nourished, in no apparent distress. Eyes: PERRLA, conjunctiva no swelling or erythema ENT/Mouth: Mask in place; Hearing normal.  Neck: Supple Respiratory: Respiratory effort normal, BS equal bilaterally without rales, rhonchi, wheezing or stridor.  Cardio: RRR with no MRGs. Brisk peripheral pulses without edema.  Abdomen: Non tender Lymphatics: Non tender without lymphadenopathy.  Musculoskeletal: No obvious deformity, effusion, normal gait.  Skin: Warm, dry; she has scaly patches with faint erythematous base to bil inner upper eyelids; R nasolabial fold; R abdominal fold; popliteal fossa. She has large erythematous shiny area covering left neck/upper shoulder near collar line Neuro: Cranial nerves intact. Normal muscle tone, no cerebellar symptoms. Sensation intact.  Psych: Awake and oriented X 3, normal affect, Insight and Judgment appropriate.     Izora Ribas, NP 3:58 PM [facility]

## 2019-09-13 NOTE — Patient Instructions (Addendum)
Avoid hot showers Moisturize right after  Eucerine is good  But try aveeno oatmeal based Or lanolin based products   If any sign of infection - call me- will send in doxycycline      Eczema Eczema is a broad term for a group of skin conditions that cause skin to become rough and inflamed. Each type of eczema has different triggers, symptoms, and treatments. Eczema of any type is usually itchy and symptoms range from mild to severe. Eczema and its symptoms are not spread from person to person (are not contagious). It can appear on different parts of the body at different times. Your eczema may not look the same as someone else's eczema. What are the types of eczema? Atopic dermatitis This is a long-term (chronic) skin disease that keeps coming back (recurring). Usual symptoms are dry skin and small, solid pimples that may swell and leak fluid (weep). Contact dermatitis  This happens when something irritates the skin and causes a rash. The irritation can come from substances that you are allergic to (allergens), such as poison ivy, chemicals, or medicines that were applied to your skin. Dyshidrotic eczema This is a form of eczema on the hands and feet. It shows up as very itchy, fluid-filled blisters. It can affect people of any age, but is more common before age 54. Hand eczema  This causes very itchy areas of skin on the palms and sides of the hands and fingers. This type of eczema is common in industrial jobs where you may be exposed to many different types of irritants. Lichen simplex chronicus This type of eczema occurs when a person constantly scratches one area of the body. Repeated scratching of the area leads to thickened skin (lichenification). Lichen simplex chronicus can occur along with other types of eczema. It is more common in adults, but may be seen in children as well. Nummular eczema This is a common type of eczema. It has no known cause. It typically causes a red,  circular, crusty lesion (plaque) that may be itchy. Scratching may become a habit and can cause bleeding. Nummular eczema occurs most often in people of middle-age or older. It most often affects the hands. Seborrheic dermatitis This is a common skin disease that mainly affects the scalp. It may also affect any oily areas of the body, such as the face, sides of nose, eyebrows, ears, eyelids, and chest. It is marked by small scaling and redness of the skin (erythema). This can affect people of all ages. In infants, this condition is known as Chartered certified accountant." Stasis dermatitis This is a common skin disease that usually appears on the legs and feet. It most often occurs in people who have a condition that prevents blood from being pumped through the veins in the legs (chronic venous insufficiency). Stasis dermatitis is a chronic condition that needs long-term management. How is eczema diagnosed? Your health care provider will examine your skin and review your medical history. He or she may also give you skin patch tests. These tests involve taking patches that contain possible allergens and placing them on your back. He or she will then check in a few days to see if an allergic reaction occurred. What are the common treatments? Treatment for eczema is based on the type of eczema you have. Hydrocortisone steroid medicine can relieve itching quickly and help reduce inflammation. This medicine may be prescribed or obtained over-the-counter, depending on the strength of the medicine that is needed. Follow these instructions  at home:  Take over-the-counter and prescription medicines only as told by your health care provider.  Use creams or ointments to moisturize your skin. Do not use lotions.  Learn what triggers or irritates your symptoms. Avoid these things.  Treat symptom flare-ups quickly.  Do not itch your skin. This can make your rash worse.  Keep all follow-up visits as told by your health care  provider. This is important. Where to find more information  The American Academy of Dermatology: http://jones-macias.info/  The National Eczema Association: www.nationaleczema.org Contact a health care provider if:  You have serious itching, even with treatment.  You regularly scratch your skin until it bleeds.  Your rash looks different than usual.  Your skin is painful, swollen, or more red than usual.  You have a fever. Summary  There are eight general types of eczema. Each type has different triggers.  Eczema of any type causes itching that may range from mild to severe.  Treatment varies based on the type of eczema you have. Hydrocortisone steroid medicine can help with itching and inflammation.  Protecting your skin is the best way to prevent eczema. Use moisturizers and lotions. Avoid triggers and irritants, and treat flare-ups quickly. This information is not intended to replace advice given to you by your health care provider. Make sure you discuss any questions you have with your health care provider. Document Revised: 07/30/2017 Document Reviewed: 12/31/2016 Elsevier Patient Education  [place].

## 2019-09-25 ENCOUNTER — Other Ambulatory Visit: Payer: Self-pay | Admitting: Internal Medicine

## 2019-09-25 DIAGNOSIS — Z1231 Encounter for screening mammogram for malignant neoplasm of breast: Secondary | ICD-10-CM

## 2019-10-02 ENCOUNTER — Encounter: Payer: Self-pay | Admitting: Internal Medicine

## 2019-10-17 ENCOUNTER — Encounter: Payer: Self-pay | Admitting: Internal Medicine

## 2019-10-17 NOTE — Progress Notes (Signed)
Annual Screening/Preventative Visit & Comprehensive Evaluation &  Examination     This very nice 79 y.o.  [name] presents for a Screening /Preventative Visit & comprehensive evaluation and management of multiple medical co-morbidities.  Patient has been followed for HTN, HLD, Prediabetes  and Vitamin D Deficiency.     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      HTN predates circa 2008 . Patient's BP has been controlled at home and patient denies any cardiac symptoms as chest pain, palpitations, shortness of breath, dizziness or ankle swelling. Today's BP is at goal - 112/74.      Patient's hyperlipidemia is controlled with diet and Rosuvastatin. Patient denies myalgias or other medication SE's. Last lipids were at goal:  Lab Results  Component Value Date   CHOL 148 07/11/2019   HDL 59 07/11/2019   LDLCALC 75 07/11/2019   TRIG 64 07/11/2019   CHOLHDL 2.5 07/11/2019       Patient has hx/o prediabetes (A1c6.0% / 2011)  and patient denies reactive hypoglycemic symptoms, visual blurring, diabetic polys or paresthesias. Last A1c was Normal & at goal:  Lab Results  Component Value Date   HGBA1C 5.6 03/28/2019       Finally, patient has history of Vitamin D Deficiency and last Vitamin D was at goal:  Lab Results  Component Value Date   VD25OH 65 03/28/2019    Current Outpatient Medications on File Prior to Visit  Medication Sig  . ALPRAZolam (XANAX) 0.5 MG tablet TAKE 1/2 TO 1 (ONE-HALF TO ONE) TABLET BY MOUTH THREE TIMES DAILY AS NEEDED FOR ANXIETY  . aspirin EC 81 MG tablet Take 162 mg by mouth at bedtime.  . Cholecalciferol (VITAMIN D) 2000 units CAPS Take 1 capsule by mouth daily.  Marland Kitchen estradiol (ESTRACE) 1 MG tablet TAKE 1 TABLET EVERY DAY  . furosemide (LASIX) 40 MG tablet Take 1 tablet (40 mg total) by mouth as needed for edema.  Marland Kitchen latanoprost (XALATAN) 0.005 % ophthalmic solution Place 1 drop into both  eyes at bedtime.  Marland Kitchen losartan-hydrochlorothiazide (HYZAAR) 50-12.5 MG tablet TAKE 1 TABLET BY MOUTH EVERY DAY  . medroxyPROGESTERone (PROVERA) 2.5 MG tablet TAKE 1 TABLET EVERY DAY  . montelukast (SINGULAIR) 10 MG tablet TAKE 1 TABLET DAILY FOR ALLERGIES  . Naproxen Sodium (ALEVE) 220 MG CAPS Take by mouth as needed.   . Omega-3 Fatty Acids (FISH OIL) 1000 MG CAPS Take by mouth 2 (two) times daily.  . pregabalin (LYRICA) 150 MG capsule Take 1 capsule at Bedtime for Chronic Pain  . pyridOXINE (VITAMIN B-6) 100 MG tablet Take 100 mg by mouth 2 (two) times a day.   . rosuvastatin (CRESTOR) 40 MG tablet Take 1/2 to 1 tablet Daily for Cholesterol  . timolol (TIMOPTIC) 0.5 % ophthalmic solution Place 1 drop into both eyes every morning.   . triamcinolone ointment (KENALOG) 0.1 % Apply 1 application topically 2 (two) times daily.   No current facility-administered medications on file prior to visit.   Allergies  Allergen Reactions  . Oxycodone-Acetaminophen Other (See Comments)    panic attack  . Vit C-Cholecalciferol-Rose Hip [Cholecalciferol-Vitamin C] Other (See Comments)    dyspepsia  . Ibuprofen Hives  . Macrodantin [Nitrofurantoin Macrocrystal] Rash   Past Medical History:  Diagnosis Date  . Allergy   . Anemia   . Arthritis   . Blood transfusion without reported diagnosis   . Cataract  removed both eyes   . Cervical radicular pain 02/11/2016  . Glaucoma    low pressure glaucoma   . Hyperlipidemia   . Hypertension   . Osteopenia   . Pelvis fracture [facility]) March 26, 2002   both sides fx  . Positive colorectal cancer screening using Cologuard test 04/14/2017  . Pre-diabetes   . 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 Vitamin D deficiency    Health Maintenance  Topic Date Due  . MAMMOGRAM  10/11/2019  . TETANUS/TDAP  01/27/2022  . INFLUENZA VACCINE  Completed  . DEXA SCAN  Completed  . PNA vac Low Risk Adult  Completed   Immunization History  Administered Date(s)  Administered  . Influenza, High Dose Seasonal PF 06/06/2014, 05/14/2016, 06/24/2018, 06/05/2019  . Influenza-Unspecified 06/13/2013, 05/02/2015, 06/24/2018  . Moderna SARS-COVID-2 Vaccination 09/13/2019, 10/11/2019  . Pneumococcal Conjugate-13 05/24/2014  . Pneumococcal Polysaccharide-23 01/08/2009  . Tdap 01/28/2012  . Zoster 08/31/2006   Last Colon - 06/15/2017 - Dr Havery Moros - recc no f/u due to age  Last MGM - 10/10/18 & f/u scheduled 10/30/2019  Past Surgical History:  Procedure Laterality Date  . COLONOSCOPY    . DILATION AND CURETTAGE OF UTERUS     age 46   . right elbow   surgery  March 26, 2002  . right hand ulner nerve surgery  March 26, 2002   I and d done right hand/wrist  . right shoulder humerus fx  july 2003   surgery done  . right total hip arthroplasty  2008  . right total knee arthroplasty  sept 2012  . right wrist plating  2004  . TONSILLECTOMY  age 59  . TOTAL HIP ARTHROPLASTY  10/22/2011   Procedure: TOTAL HIP ARTHROPLASTY ANTERIOR APPROACH;  Surgeon: Mauri Pole, MD;  Location: [facility];  Service: Orthopedics;  Laterality: Left;  Left Total Hip Arthroplasty,  Anterior Approach    Family History  Problem Relation Age of Onset  . Hypertension Mother   . Diabetes Mother   . Heart disease Mother        enlarged heart  . Atrial fibrillation Mother   . Diabetes Father   . Heart disease Father   . Heart attack Father 28  . Breast cancer Maternal Aunt        early 23's  . Breast cancer Maternal Grandmother        early 52's  . Breast cancer Paternal Aunt        51's  . Atrial fibrillation Brother   . Atrial fibrillation Brother   . Colon cancer Neg Hx   . Esophageal cancer Neg Hx   . Rectal cancer Neg Hx   . Stomach cancer Neg Hx   . Pancreatic cancer Neg Hx    Social History   Tobacco Use  . Smoking status: Never Smoker  . Smokeless tobacco: Never Used  Substance Use Topics  . Alcohol use: Yes    Comment: social  . Drug use: No     ROS Constitutional: Denies fever, chills, weight loss/gain, headaches, insomnia,  night sweats, and change in appetite. Does c/o fatigue. Eyes: Denies redness, blurred vision, diplopia, discharge, itchy, watery eyes.  ENT: Denies discharge, congestion, post nasal drip, epistaxis, sore throat, earache, hearing loss, dental pain, Tinnitus, Vertigo, Sinus pain, snoring.  Cardio: Denies chest pain, palpitations, irregular heartbeat, syncope, dyspnea, diaphoresis, orthopnea, PND, claudication, edema Respiratory: denies cough, dyspnea, DOE, pleurisy, hoarseness, laryngitis, wheezing.  Gastrointestinal:  Denies dysphagia, heartburn, reflux, water brash, pain, cramps, nausea, vomiting, bloating, diarrhea, constipation, hematemesis, melena, hematochezia, jaundice, hemorrhoids Genitourinary: Denies dysuria, frequency, urgency, nocturia, hesitancy, discharge, hematuria, flank pain Breast: Breast lumps, nipple discharge, bleeding.  Musculoskeletal: Denies arthralgia, myalgia, stiffness, Jt. Swelling, pain, limp, and strain/sprain. Denies falls. Skin: Denies puritis, rash, hives, warts, acne, eczema, changing in skin lesion Neuro: No weakness, tremor, incoordination, spasms, paresthesia, pain Psychiatric: Denies confusion, memory loss, sensory loss. Denies Depression. Endocrine: Denies change in weight, skin, hair change, nocturia, and paresthesia, diabetic polys, visual blurring, hyper / hypo glycemic episodes.  Heme/Lymph: No excessive bleeding, bruising, enlarged lymph nodes.  Physical Exam  BP 112/74   Pulse 64   Temp 97.8 F (36.6 C)   Resp 16   Ht 5\' 2"  (1.575 m)   Wt 162 lb 6.4 oz (73.7 kg)   BMI 29.70 kg/m   General Appearance: Well nourished, well groomed and in no apparent distress.  Eyes: PERRLA, EOMs, conjunctiva no swelling or erythema, normal fundi and vessels. Sinuses: No frontal/maxillary tenderness ENT/Mouth: EACs patent / TMs  nl. Nares clear without erythema, swelling, mucoid  exudates. Oral hygiene is good. No erythema, swelling, or exudate. Tongue normal, non-obstructing. Tonsils not swollen or erythematous. Hearing normal.  Neck: Supple, thyroid not palpable. No bruits, nodes or JVD. Respiratory: Respiratory effort normal.  BS equal and clear bilateral without rales, rhonci, wheezing or stridor. Cardio: Heart sounds are normal with regular rate and rhythm and no murmurs, rubs or gallops. Peripheral pulses are normal and equal bilaterally without edema. No aortic or femoral bruits. Chest: symmetric with normal excursions and percussion. Breasts: Symmetric, without lumps, nipple discharge, retractions, or fibrocystic changes.  Abdomen: Flat, soft with bowel sounds active. Nontender, no guarding, rebound, hernias, masses, or organomegaly.  Lymphatics: Non tender without lymphadenopathy.  Genitourinary:  Musculoskeletal: Full ROM all peripheral extremities, joint stability, 5/5 strength, and normal gait. Skin: Warm and dry without rashes, lesions, cyanosis, clubbing or  ecchymosis.  Neuro: Cranial nerves intact, reflexes equal bilaterally. Normal muscle tone, no cerebellar symptoms. Sensation intact.  Pysch: Alert and oriented X 3, normal affect, Insight and Judgment appropriate.   Assessment and Plan  1. Annual Preventative Screening Examination  2. Essential hypertension  - EKG 12-Lead - Urinalysis, Routine w reflex microscopic - Microalbumin / creatinine urine ratio - CBC with Differential/Platelet - COMPLETE METABOLIC PANEL WITH GFR - Magnesium - TSH  3. Hyperlipidemia, mixed  - EKG 12-Lead - Lipid panel  4. Abnormal glucose  - EKG 12-Lead - Hemoglobin A1c - Insulin, random  5. Vitamin D deficiency  - VITAMIN D 25 Hydroxy   6. 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 Screening for colorectal cancer  - POC Hemoccult Bld/Stl  8. Screening for ischemic heart disease  - EKG 12-Lead  9. FHx:  heart disease  - EKG 12-Lead  10. Medication management  - Urinalysis, Routine w reflex microscopic - Microalbumin / creatinine urine ratio - CBC with Differential/Platelet - COMPLETE METABOLIC PANEL WITH GFR - Magnesium - Lipid panel - TSH - Hemoglobin A1c - Insulin, random - VITAMIN D 25 Hydroxy         Patient was counseled in prudent diet to achieve/maintain BMI less than 25 for weight control, BP monitoring, regular exercise and medications. Discussed med's effects and SE's. Screening labs and tests as requested with regular follow-up as recommended. Over 40 minutes of exam, counseling, chart review and high complex critical decision making was performed.  Kirtland Bouchard, MD

## 2019-10-17 NOTE — Patient Instructions (Signed)

## 2019-10-18 ENCOUNTER — Ambulatory Visit (INDEPENDENT_AMBULATORY_CARE_PROVIDER_SITE_OTHER): Payer: PPO | Admitting: Internal Medicine

## 2019-10-18 ENCOUNTER — Other Ambulatory Visit: Payer: Self-pay

## 2019-10-18 VITALS — BP 112/74 | HR 64 | Temp 97.8°F | Resp 16 | Ht 62.0 in | Wt 162.4 lb

## 2019-10-18 DIAGNOSIS — Z0001 Encounter for general adult medical examination with abnormal findings: Secondary | ICD-10-CM

## 2019-10-18 DIAGNOSIS — E782 Mixed hyperlipidemia: Secondary | ICD-10-CM

## 2019-10-18 DIAGNOSIS — I10 Essential (primary) hypertension: Secondary | ICD-10-CM | POA: Diagnosis not present

## 2019-10-18 DIAGNOSIS — R7309 Other abnormal glucose: Secondary | ICD-10-CM

## 2019-10-18 DIAGNOSIS — Z0000 Encounter for general adult medical examination without abnormal findings: Secondary | ICD-10-CM

## 2019-10-18 DIAGNOSIS — Z8249 Family history of ischemic heart disease and other diseases of the circulatory system: Secondary | ICD-10-CM

## 2019-10-18 DIAGNOSIS — Z79899 Other long term (current) drug therapy: Secondary | ICD-10-CM | POA: Diagnosis not present

## 2019-10-18 DIAGNOSIS — Z136 Encounter for screening for cardiovascular disorders: Secondary | ICD-10-CM | POA: Diagnosis not present

## 2019-10-18 DIAGNOSIS — E559 Vitamin D deficiency, unspecified: Secondary | ICD-10-CM | POA: Diagnosis not present

## 2019-10-18 DIAGNOSIS — G90519 Complex regional pain syndrome I of unspecified upper limb: Secondary | ICD-10-CM

## 2019-10-18 DIAGNOSIS — Z1211 Encounter for screening for malignant neoplasm of colon: Secondary | ICD-10-CM

## 2019-10-19 LAB — URINALYSIS, ROUTINE W REFLEX MICROSCOPIC
Bacteria, UA: NONE SEEN /HPF
Bilirubin Urine: NEGATIVE
Glucose, UA: NEGATIVE
Hgb urine dipstick: NEGATIVE
Hyaline Cast: NONE SEEN /LPF
Ketones, ur: NEGATIVE
Nitrite: NEGATIVE
Protein, ur: NEGATIVE
RBC / HPF: NONE SEEN /HPF (ref 0–2)
Specific Gravity, Urine: 1.014 (ref 1.001–1.03)
pH: 7.5 (ref 5.0–8.0)

## 2019-10-19 LAB — CBC WITH DIFFERENTIAL/PLATELET
Absolute Monocytes: 570 cells/uL (ref 200–950)
Basophils Absolute: 30 cells/uL (ref 0–200)
Basophils Relative: 0.6 %
Eosinophils Absolute: 140 cells/uL (ref 15–500)
Eosinophils Relative: 2.8 %
HCT: 38.8 % (ref 35.0–45.0)
Hemoglobin: 12.8 g/dL (ref 11.7–15.5)
Lymphs Abs: 1910 cells/uL (ref 850–3900)
MCH: 30 pg (ref 27.0–33.0)
MCHC: 33 g/dL (ref 32.0–36.0)
MCV: 91.1 fL (ref 80.0–100.0)
MPV: 11.7 fL (ref 7.5–12.5)
Monocytes Relative: 11.4 %
Neutro Abs: 2350 cells/uL (ref 1500–7800)
Neutrophils Relative %: 47 %
Platelets: 272 10*3/uL (ref 140–400)
RBC: 4.26 10*6/uL (ref 3.80–5.10)
RDW: 13 % (ref 11.0–15.0)
Total Lymphocyte: 38.2 %
WBC: 5 10*3/uL (ref 3.8–10.8)

## 2019-10-19 LAB — COMPLETE METABOLIC PANEL WITH GFR
AG Ratio: 1.7 (calc) (ref 1.0–2.5)
ALT: 30 U/L — ABNORMAL HIGH (ref 6–29)
AST: 32 U/L (ref 10–35)
Albumin: 4 g/dL (ref 3.6–5.1)
Alkaline phosphatase (APISO): 76 U/L (ref 37–153)
BUN: 20 mg/dL (ref 7–25)
CO2: 29 mmol/L (ref 20–32)
Calcium: 9 mg/dL (ref 8.6–10.4)
Chloride: 102 mmol/L (ref 98–110)
Creat: 0.87 mg/dL (ref 0.60–0.93)
GFR, Est African American: 74 mL/min/{1.73_m2} (ref >=60)
GFR, Est Non African American: 64 mL/min/{1.73_m2} (ref >=60)
Globulin: 2.4 g/dL (calc) (ref 1.9–3.7)
Glucose, Bld: 92 mg/dL (ref 65–99)
Potassium: 4.1 mmol/L (ref 3.5–5.3)
Sodium: 139 mmol/L (ref 135–146)
Total Bilirubin: 0.6 mg/dL (ref 0.2–1.2)
Total Protein: 6.4 g/dL (ref 6.1–8.1)

## 2019-10-19 LAB — LIPID PANEL
Cholesterol: 137 mg/dL (ref <200)
HDL: 54 mg/dL (ref >=50)
LDL Cholesterol (Calc): 68 mg/dL (calc)
Non-HDL Cholesterol (Calc): 83 mg/dL (calc) (ref <130)
Total CHOL/HDL Ratio: 2.5 (calc) (ref <5.0)
Triglycerides: 70 mg/dL (ref <150)

## 2019-10-19 LAB — TSH: TSH: 1.52 mIU/L (ref 0.40–4.50)

## 2019-10-19 LAB — INSULIN, RANDOM: Insulin: 4.8 u[IU]/mL

## 2019-10-19 LAB — MICROALBUMIN / CREATININE URINE RATIO
Creatinine, Urine: 86 mg/dL (ref 20–275)
Microalb Creat Ratio: 2 mcg/mg creat (ref <30)
Microalb, Ur: 0.2 mg/dL

## 2019-10-19 LAB — HEMOGLOBIN A1C
Hgb A1c MFr Bld: 5.6 % of total Hgb (ref <5.7)
Mean Plasma Glucose: 114 (calc)
eAG (mmol/L): 6.3 (calc)

## 2019-10-19 LAB — VITAMIN D 25 HYDROXY (VIT D DEFICIENCY, FRACTURES): Vit D, 25-Hydroxy: 69 ng/mL (ref 30–100)

## 2019-10-19 LAB — MAGNESIUM: Magnesium: 2 mg/dL (ref 1.5–2.5)

## 2019-10-30 ENCOUNTER — Ambulatory Visit
Admission: RE | Admit: 2019-10-30 | Discharge: 2019-10-30 | Disposition: A | Discharge status: Home / Self Care | Payer: PPO | Source: Ambulatory Visit | Attending: Internal Medicine | Admitting: Internal Medicine

## 2019-10-30 ENCOUNTER — Other Ambulatory Visit: Payer: Self-pay

## 2019-10-30 DIAGNOSIS — Z1231 Encounter for screening mammogram for malignant neoplasm of breast: Secondary | ICD-10-CM | POA: Diagnosis not present

## 2019-11-30 DIAGNOSIS — H04123 Dry eye syndrome of bilateral lacrimal glands: Secondary | ICD-10-CM | POA: Diagnosis not present

## 2019-11-30 DIAGNOSIS — H401232 Low-tension glaucoma, bilateral, moderate stage: Secondary | ICD-10-CM | POA: Diagnosis not present

## 2019-11-30 DIAGNOSIS — H16103 Unspecified superficial keratitis, bilateral: Secondary | ICD-10-CM | POA: Diagnosis not present

## 2019-12-01 ENCOUNTER — Other Ambulatory Visit: Payer: Self-pay | Admitting: Internal Medicine

## 2020-01-01 ENCOUNTER — Ambulatory Visit: Payer: PPO | Admitting: Adult Health

## 2020-01-15 NOTE — Progress Notes (Deleted)
MEDICARE ANNUAL WELLNESS VISIT AND FOLLOW UP  Assessment:    Encounter for Medicare annual wellness exam 1 year  Essential hypertension - continue medications, DASH diet, exercise and monitor at home. Call if greater than 130/80.  -     CBC with Differential/Platelet -     CMP/GFR  Hyperlipidemia -continue medications, check lipids, decrease fatty foods, increase activity.  -     Lipid panel -     TSH  Abnormal glucose Discussed general issues about diabetes pathophysiology and management., Educational material distributed., Suggested low cholesterol diet., Encouraged aerobic exercise., Discussed foot care., Reminded to get yearly retinal exam.  Medication management -     Magnesium  Overweight - long discussion about weight loss, diet, and exercise  Generalized anxiety disorder *** continue medications, stress management techniques discussed, increase water, good sleep hygiene discussed, increase exercise, and increase veggies.   Cervical radicular pain Continue ortho follow up  Open-angle glaucoma, unspecified glaucoma stage, unspecified laterality, unspecified open-angle glaucoma type Continue eye doctor follow up  Vitamin D deficiency Continue supplement   Over 30 minutes of exam, counseling, chart review, and critical decision making was performed Future Appointments  Date Time Provider [place]  01/16/2020  9:00 AM Liane Comber, NP [facility] None  04/24/2020 11:30 AM Unk Pinto, MD [facility] None  10/30/2020 10:00 AM Unk Pinto, MD [facility] None    Plan:   During the course of the visit the patient was educated and counseled about appropriate screening and preventive services including:    Pneumococcal vaccine   Influenza vaccine  Td vaccine  Prevnar 13  Screening electrocardiogram  Screening mammography  Bone densitometry screening  Colorectal cancer screening  Diabetes screening  Glaucoma  screening  Nutrition counseling   Advanced directives: given info/requested copies   Subjective:   Gwendolyn [name] is a 79 y.o. female who presents for Medicare Annual Wellness Visit and 3 month follow up on hypertension, prediabetes, hyperlipidemia, vitamin D def.  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   She has hx of anxiety, has been prescribed xanax though improved recently and hasn't used in several months.   She is on estrogen and progesterone, she is on bASA, she is down to estradiol 0.5 mg, provera 1.25 mg daily, prescribed by Dr. Ulanda Edison.   Cervical arthritis/radicular pain, managing fairly with lifestyle modification (sleeping on back, flatter pillow), using aleve PRN, lyrica.    BMI is There is no height or weight on file to calculate BMI., she has been working on diet and exercise. She is on weight watchers and following weekly via zoom.  Wt Readings from Last 3 Encounters:  10/18/19 162 lb 6.4 oz (73.7 kg)  09/13/19 172 lb 12.8 oz (78.4 kg)  07/11/19 166 lb 3.2 oz (75.4 kg)   Her blood pressure has been controlled at home, today their BP is    She does workout, goes to [employer] 3 days a week. She denies chest pain, shortness of breath, dizziness. She takes lasix AS needed for swelling but has severe leg cramps that night in her ankles, takes the kdur 60meq with it but still with pain.    She is on cholesterol medication and has myalgias with crestor (20 mg three times a week) on the days she takes it ***. Her cholesterol is at goal. The cholesterol last visit was:   Lab Results  Component Value Date   CHOL 137 10/18/2019   HDL 54 10/18/2019   LDLCALC 68 10/18/2019  TRIG 70 10/18/2019   CHOLHDL 2.5 10/18/2019    She has been working on diet and exercise for glucose management, and denies paresthesia of the feet, polydipsia, polyuria and visual disturbances. Last A1C in the office was:  Lab Results  Component Value Date   HGBA1C 5.6  10/18/2019   Lab Results  Component Value Date   GFRNONAA 64 10/18/2019   Patient is on Vitamin D supplement and at goal at recent check:  Lab Results  Component Value Date   VD25OH 69 10/18/2019       Medication Review Current Outpatient Medications on File Prior to Visit  Medication Sig  . ALPRAZolam (XANAX) 0.5 MG tablet TAKE 1/2 TO 1 (ONE-HALF TO ONE) TABLET BY MOUTH THREE TIMES DAILY AS NEEDED FOR ANXIETY  . aspirin EC 81 MG tablet Take 162 mg by mouth at bedtime.  . Cholecalciferol (VITAMIN D) 2000 units CAPS Take 1 capsule by mouth daily.  Marland Kitchen estradiol (ESTRACE) 1 MG tablet TAKE 1 TABLET EVERY DAY  . furosemide (LASIX) 40 MG tablet Take 1 tablet (40 mg total) by mouth as needed for edema.  Marland Kitchen latanoprost (XALATAN) 0.005 % ophthalmic solution Place 1 drop into both eyes at bedtime.  Marland Kitchen losartan-hydrochlorothiazide (HYZAAR) 50-12.5 MG tablet Take 1 tablet Daily for BP  . medroxyPROGESTERone (PROVERA) 2.5 MG tablet TAKE 1 TABLET EVERY DAY  . montelukast (SINGULAIR) 10 MG tablet TAKE 1 TABLET DAILY FOR ALLERGIES  . Naproxen Sodium (ALEVE) 220 MG CAPS Take by mouth as needed.   . Omega-3 Fatty Acids (FISH OIL) 1000 MG CAPS Take by mouth 2 (two) times daily.  . pregabalin (LYRICA) 150 MG capsule Take 1 capsule at Bedtime for Chronic Pain  . pyridOXINE (VITAMIN B-6) 100 MG tablet Take 100 mg by mouth 2 (two) times a day.   . rosuvastatin (CRESTOR) 40 MG tablet Take 1/2 to 1 tablet Daily for Cholesterol  . timolol (TIMOPTIC) 0.5 % ophthalmic solution Place 1 drop into both eyes every morning.   . triamcinolone ointment (KENALOG) 0.1 % Apply 1 application topically 2 (two) times daily.   No current facility-administered medications on file prior to visit.    Current Problems (verified) Patient Active Problem List   Diagnosis Date Noted  . Cervical arthritis 09/27/2017  . Open-angle glaucoma 05/14/2016  . Generalized anxiety disorder 02/11/2016  . Overweight (BMI 25.0-29.9)  04/25/2015  . Medication management 02/12/2014  . Essential hypertension 08/08/2013  . Hyperlipidemia, mixed 08/08/2013  . Abnormal glucose 08/08/2013  . Vitamin D deficiency 08/08/2013    Screening Tests Immunization History  Administered Date(s) Administered  . Influenza, High Dose Seasonal PF 06/06/2014, 05/14/2016, 06/24/2018, 06/05/2019  . Influenza-Unspecified 06/13/2013, 05/02/2015, 06/24/2018  . Moderna SARS-COVID-2 Vaccination 09/13/2019, 10/11/2019  . Pneumococcal Conjugate-13 05/24/2014  . Pneumococcal Polysaccharide-23 01/08/2009  . Tdap 01/28/2012  . Zoster 08/31/2006     Preventative care: Last colonoscopy: 2018, BENIGN polyps, DONE unless concerns per Dr. Havery Moros Cologuard 2018 (positive),  Last mammogram: 10/2019 Last pap smear/pelvic exam: 2019 Dr. Ulanda Edison, declines another  DEXA: 02/2016 normal *** CXR 12/2012 MRI LUMBAR 2009  Prior vaccinations: TD or Tdap: 2013  Influenza: 06/2019 Pneumococcal: 2010 Prevnar13: 2015 Shingles/Zostavax: 2008 Covid 19: 2/2, 2021 ***  Names of Other Physician/Practitioners you currently use: 1. [facility]- here for primary care 2. [place], eye doctor, last visit 2020 3. Dr. Marland Kitchen  dentist, last visit 2020  Patient Care Team: Unk Pinto, MD as PCP - General (Internal Medicine) Delfin Edis  M, MD (Inactive) as Consulting Physician (Gastroenterology) Newton Pigg, MD as Consulting Physician (Obstetrics and Gynecology)  Allergies Allergies  Allergen Reactions  . Oxycodone-Acetaminophen Other (See Comments)    panic attack  . Vit C-Cholecalciferol-Rose Hip [Cholecalciferol-Vitamin C] Other (See Comments)    dyspepsia  . Ibuprofen Hives  . Macrodantin [Nitrofurantoin Macrocrystal] Rash    SURGICAL HISTORY She  has a past surgical history that includes right shoulder humerus fx (july 2003); right hand ulner nerve surgery (March 26, 2002); right elbow   surgery (March 26, 2002); right wrist plating (2004); right total hip arthroplasty (2008); right total knee arthroplasty (sept 2012); Tonsillectomy (age 63); Total hip arthroplasty (10/22/2011); Colonoscopy; and Dilation and curettage of uterus. FAMILY HISTORY Her family history includes Atrial fibrillation in her brother, brother, and mother; Breast cancer in her maternal aunt, maternal grandmother, and paternal aunt; Diabetes in her father and mother; Heart attack (age of onset: 20) in her father; Heart disease in her father and mother; Hypertension in her mother. SOCIAL HISTORY She  reports that she has never smoked. She has never used smokeless tobacco. She reports current alcohol use. She reports that she does not use drugs.  MEDICARE WELLNESS OBJECTIVES: Physical activity:   Cardiac risk factors:   Depression/mood screen:   Depression screen [facility] 2/9 10/17/2019  Decreased Interest 0  Down, Depressed, Hopeless 0  PHQ - 2 Score 0    ADLs:  In your present state of health, do you have any difficulty performing the following activities: 10/17/2019 03/27/2019  Hearing? N N  Vision? N N  Difficulty concentrating or making decisions? N N  Walking or climbing stairs? N N  Dressing or bathing? N N  Doing errands, shopping? N N  Some recent data might be hidden    Cognitive Testing  Alert? Yes  Normal Appearance?Yes  Oriented to person? Yes  Place? Yes   Time? Yes  Recall of three objects?  Yes  Can perform simple calculations? Yes  Displays appropriate judgment?Yes  Can read the correct time from a watch face?Yes  EOL planning:     Objective:   There were no vitals filed for this visit. There is no height or weight on file to calculate BMI.  General appearance: alert, no distress, WD/WN,  female HEENT: normocephalic, sclerae anicteric, TMs pearly, nares patent, no discharge or erythema, pharynx normal Oral cavity: MMM, no lesions Neck: supple, no lymphadenopathy, no thyromegaly, no masses Heart:  RRR, normal S1, S2, no murmurs Lungs: CTA bilaterally, no wheezes, rhonchi, or rales Abdomen: +bs, soft, non tender, non distended, no masses, no hepatomegaly, no splenomegaly Musculoskeletal: nontender, no swelling, no obvious deformity Extremities: no edema, no cyanosis, no clubbing Pulses: 2+ symmetric, upper and lower extremities, normal cap refill Neurological: alert, oriented x 3, CN2-12 intact, strength normal upper extremities and lower extremities, sensation normal throughout, DTRs 2+ throughout, no cerebellar signs, gait normal Psychiatric: normal affect, behavior normal, pleasant   Medicare Attestation I have personally reviewed: The patient's medical and social history Their use of alcohol, tobacco or illicit drugs Their current medications and supplements The patient's functional ability including ADLs,fall risks, home safety risks, cognitive, and hearing and visual impairment Diet and physical activities Evidence for depression or mood disorders  The patient's weight, height, BMI, and visual acuity have been recorded in the chart.  I have made referrals, counseling, and provided education to the patient based on review of the above and I have provided the patient with a written personalized  care plan for preventive services.     Izora Ribas, NP   01/15/2020

## 2020-01-16 ENCOUNTER — Ambulatory Visit: Payer: PPO | Admitting: Adult Health

## 2020-01-30 ENCOUNTER — Encounter: Payer: Self-pay | Admitting: Adult Health Nurse Practitioner

## 2020-01-30 ENCOUNTER — Other Ambulatory Visit: Payer: Self-pay

## 2020-01-30 ENCOUNTER — Ambulatory Visit (INDEPENDENT_AMBULATORY_CARE_PROVIDER_SITE_OTHER): Payer: PPO | Admitting: Adult Health Nurse Practitioner

## 2020-01-30 VITALS — BP 124/68 | HR 73 | Temp 97.3°F | Ht 62.0 in | Wt 160.6 lb

## 2020-01-30 DIAGNOSIS — E559 Vitamin D deficiency, unspecified: Secondary | ICD-10-CM

## 2020-01-30 DIAGNOSIS — H4010X0 Unspecified open-angle glaucoma, stage unspecified: Secondary | ICD-10-CM

## 2020-01-30 DIAGNOSIS — F411 Generalized anxiety disorder: Secondary | ICD-10-CM | POA: Diagnosis not present

## 2020-01-30 DIAGNOSIS — Z0001 Encounter for general adult medical examination with abnormal findings: Secondary | ICD-10-CM | POA: Diagnosis not present

## 2020-01-30 DIAGNOSIS — R6889 Other general symptoms and signs: Secondary | ICD-10-CM | POA: Diagnosis not present

## 2020-01-30 DIAGNOSIS — R7309 Other abnormal glucose: Secondary | ICD-10-CM | POA: Diagnosis not present

## 2020-01-30 DIAGNOSIS — Z0000 Encounter for general adult medical examination without abnormal findings: Secondary | ICD-10-CM

## 2020-01-30 DIAGNOSIS — E2839 Other primary ovarian failure: Secondary | ICD-10-CM

## 2020-01-30 DIAGNOSIS — Z79899 Other long term (current) drug therapy: Secondary | ICD-10-CM

## 2020-01-30 DIAGNOSIS — E782 Mixed hyperlipidemia: Secondary | ICD-10-CM | POA: Diagnosis not present

## 2020-01-30 DIAGNOSIS — E663 Overweight: Secondary | ICD-10-CM | POA: Diagnosis not present

## 2020-01-30 DIAGNOSIS — I10 Essential (primary) hypertension: Secondary | ICD-10-CM

## 2020-01-30 LAB — CBC WITH DIFFERENTIAL/PLATELET
Absolute Monocytes: 696 cells/uL (ref 200–950)
Basophils Absolute: 42 cells/uL (ref 0–200)
Basophils Relative: 0.7 %
Eosinophils Absolute: 192 cells/uL (ref 15–500)
Eosinophils Relative: 3.2 %
HCT: 37.3 % (ref 35.0–45.0)
Hemoglobin: 12.2 g/dL (ref 11.7–15.5)
Lymphs Abs: 1614 cells/uL (ref 850–3900)
MCH: 30.9 pg (ref 27.0–33.0)
MCHC: 32.7 g/dL (ref 32.0–36.0)
MCV: 94.4 fL (ref 80.0–100.0)
MPV: 12.2 fL (ref 7.5–12.5)
Monocytes Relative: 11.6 %
Neutro Abs: 3456 cells/uL (ref 1500–7800)
Neutrophils Relative %: 57.6 %
Platelets: 238 10*3/uL (ref 140–400)
RBC: 3.95 10*6/uL (ref 3.80–5.10)
RDW: 13.1 % (ref 11.0–15.0)
Total Lymphocyte: 26.9 %
WBC: 6 10*3/uL (ref 3.8–10.8)

## 2020-01-30 LAB — COMPLETE METABOLIC PANEL WITH GFR
AG Ratio: 1.7 (calc) (ref 1.0–2.5)
ALT: 23 U/L (ref 6–29)
AST: 26 U/L (ref 10–35)
Albumin: 3.9 g/dL (ref 3.6–5.1)
Alkaline phosphatase (APISO): 65 U/L (ref 37–153)
BUN/Creatinine Ratio: 31 (calc) — ABNORMAL HIGH (ref 6–22)
BUN: 26 mg/dL — ABNORMAL HIGH (ref 7–25)
CO2: 29 mmol/L (ref 20–32)
Calcium: 9 mg/dL (ref 8.6–10.4)
Chloride: 106 mmol/L (ref 98–110)
Creat: 0.85 mg/dL (ref 0.60–0.93)
GFR, Est African American: 76 mL/min/{1.73_m2} (ref >=60)
GFR, Est Non African American: 66 mL/min/{1.73_m2} (ref >=60)
Globulin: 2.3 g/dL (calc) (ref 1.9–3.7)
Glucose, Bld: 91 mg/dL (ref 65–99)
Potassium: 4.2 mmol/L (ref 3.5–5.3)
Sodium: 141 mmol/L (ref 135–146)
Total Bilirubin: 0.6 mg/dL (ref 0.2–1.2)
Total Protein: 6.2 g/dL (ref 6.1–8.1)

## 2020-01-30 LAB — LIPID PANEL
Cholesterol: 142 mg/dL (ref <200)
HDL: 57 mg/dL (ref >=50)
LDL Cholesterol (Calc): 72 mg/dL (calc)
Non-HDL Cholesterol (Calc): 85 mg/dL (calc) (ref <130)
Total CHOL/HDL Ratio: 2.5 (calc) (ref <5.0)
Triglycerides: 57 mg/dL (ref <150)

## 2020-01-30 NOTE — Progress Notes (Signed)
MEDICARE ANNUAL WELLNESS VISIT AND FOLLOW UP  Assessment:   Bryona was seen today for follow-up and medicare wellness.  Diagnoses and all orders for this visit:  Encounter for Medicare annual wellness exam Yearly  Essential hypertension Continue current medications: Losartan/HCTZ 50/12/5mg  Has Lasix 40mg , takes once a week or every other weeks. Monitor blood pressure at home; call if consistently over 130/80 Continue DASH diet.   Reminder to go to the ER if any CP, SOB, nausea, dizziness, severe HA, changes vision/speech, left arm numbness and tingling and jaw pain.  -     CBC with Differential/Platelet -     COMPLETE METABOLIC PANEL WITH GFR  Hyperlipidemia, mixed Continue medications: rosuvastatin 40mg  three times a week Takes Fish oil 1,000mg  BID Discussed dietary and exercise modifications Low fat diet -     Lipid panel  Abnormal glucose Discussed dietary and exercise modifications  Overweight (BMI 25.0-29.9) Discussed dietary and exercise modifications  Generalized anxiety disorder Doing well on current regiment Continue alprazolam 0.5mg  half to one TID PRN Discussed stress management techniques   Discussed good sleep hygiene Discussed increasing physical activity and exercise Increase water intake  Open-angle glaucoma, unspecified glaucoma stage, unspecified laterality, unspecified open-angle glaucoma type Continue drops as prescribed Yearly eye exams Doing well at this time  Cervical arthritis/radicular pain Doing well at this time Lyrica helping with this Follow with with as needed  Vitamin D deficiency Continue supplementation to maintain goal of 60-100 Taking Vitamin D 2,000 IU daily  Estrogen deficiency Taking estradiol and provera, has self tapered half doses. No hot flashes noted Will trial stopping this.  No longer follows with GYN for refils Due for DEXA, will place order today.  Medication management Continued   Over 30 minutes of  face to face exam, counseling, chart review, and critical decision making was performed Future Appointments  Date Time Provider [place]  05/10/2020 10:30 AM Unk Pinto, MD [facility] None  10/30/2020 10:00 AM Unk Pinto, MD [facility] None  02/04/2021  9:00 AM Garnet Sierras, NP [facility] None    Plan:   During the course of the visit the patient was educated and counseled about appropriate screening and preventive services including:    Pneumococcal vaccine   Influenza vaccine  Td vaccine  Prevnar 13  Screening electrocardiogram  Screening mammography  Bone densitometry screening  Colorectal cancer screening  Diabetes screening  Glaucoma screening  Nutrition counseling   Advanced directives: given info/requested copies   Subjective:   Jalysa Rackliff Rodriques is a 79 y.o. female who presents for Medicare Annual Wellness Visit and 3 month follow up on hypertension, prediabetes, hyperlipidemia, vitamin D def.  She has hx of anxiety, has been prescribed xanax though improved.  She reports last use was over one month ago. She is on estrogen and progesterone, she is on bASA, she is down to estradiol 0.5 mg, provera 1.25 mg daily, prescribed by Dr. Ulanda Edison. She no longer follows with him for her women's health.  Cervical arthritis/radicular pain, managing fairly with lifestyle modification (sleeping on back, flatter pillow), using aleve PRN, lyrica.    BMI is Body mass index is 29.37 kg/m., she has been working on diet and exercise. She is on weight watchers and following weekly via zoom.  Wt Readings from Last 3 Encounters:  01/30/20 160 lb 9.6 oz (72.8 kg)  10/18/19 162 lb 6.4 oz (73.7 kg)  09/13/19 172 lb 12.8 oz (78.4 kg)   Her blood pressure has been controlled at home, today their  BP is BP: 124/68  She does workout, goes to [employer] 3 days a week. She denies chest pain, shortness of breath, dizziness. She takes lasix AS needed for  swelling but has severe leg cramps that night in her ankles, takes the kdur 75meq with it but still with pain.    She is on cholesterol medication and has myalgias with crestor (20 mg three times a week) on the days she takes it. Her cholesterol is at goal. The cholesterol last visit was:   Lab Results  Component Value Date   CHOL 137 10/18/2019   HDL 54 10/18/2019   LDLCALC 68 10/18/2019   TRIG 70 10/18/2019   CHOLHDL 2.5 10/18/2019    She has been working on diet and exercise for glucose management, and denies paresthesia of the feet, polydipsia, polyuria and visual disturbances. Last A1C in the office was:  Lab Results  Component Value Date   HGBA1C 5.6 10/18/2019   Lab Results  Component Value Date   GFRNONAA 64 10/18/2019   Patient is on Vitamin D supplement.   Lab Results  Component Value Date   VD25OH 69 10/18/2019       Medication Review Current Outpatient Medications on File Prior to Visit  Medication Sig  . ALPRAZolam (XANAX) 0.5 MG tablet TAKE 1/2 TO 1 (ONE-HALF TO ONE) TABLET BY MOUTH THREE TIMES DAILY AS NEEDED FOR ANXIETY  . aspirin EC 81 MG tablet Take 162 mg by mouth at bedtime.  . Cholecalciferol (VITAMIN D) 2000 units CAPS Take 1 capsule by mouth daily.  Marland Kitchen estradiol (ESTRACE) 1 MG tablet TAKE 1 TABLET EVERY DAY  . furosemide (LASIX) 40 MG tablet Take 1 tablet (40 mg total) by mouth as needed for edema.  Marland Kitchen latanoprost (XALATAN) 0.005 % ophthalmic solution Place 1 drop into both eyes at bedtime.  Marland Kitchen losartan-hydrochlorothiazide (HYZAAR) 50-12.5 MG tablet Take 1 tablet Daily for BP  . medroxyPROGESTERone (PROVERA) 2.5 MG tablet TAKE 1 TABLET EVERY DAY  . montelukast (SINGULAIR) 10 MG tablet TAKE 1 TABLET DAILY FOR ALLERGIES  . Naproxen Sodium (ALEVE) 220 MG CAPS Take by mouth as needed.   . Omega-3 Fatty Acids (FISH OIL) 1000 MG CAPS Take by mouth 2 (two) times daily.  . pregabalin (LYRICA) 150 MG capsule Take 1 capsule at Bedtime for Chronic Pain  . pyridOXINE  (VITAMIN B-6) 100 MG tablet Take 100 mg by mouth 2 (two) times a day.   . rosuvastatin (CRESTOR) 40 MG tablet Take 1/2 to 1 tablet Daily for Cholesterol  . timolol (TIMOPTIC) 0.5 % ophthalmic solution Place 1 drop into both eyes every morning.   . triamcinolone ointment (KENALOG) 0.1 % Apply 1 application topically 2 (two) times daily.   No current facility-administered medications on file prior to visit.    Current Problems (verified) Patient Active Problem List   Diagnosis Date Noted  . Cervical arthritis 09/27/2017  . Open-angle glaucoma 05/14/2016  . Generalized anxiety disorder 02/11/2016  . Overweight (BMI 25.0-29.9) 04/25/2015  . Medication management 02/12/2014  . Essential hypertension 08/08/2013  . Hyperlipidemia, mixed 08/08/2013  . Abnormal glucose 08/08/2013  . Vitamin D deficiency 08/08/2013    Screening Tests Immunization History  Administered Date(s) Administered  . Influenza, High Dose Seasonal PF 06/06/2014, 05/14/2016, 06/24/2018, 06/05/2019  . Influenza-Unspecified 06/13/2013, 05/02/2015, 06/24/2018  . Moderna SARS-COVID-2 Vaccination 09/13/2019, 10/11/2019  . Pneumococcal Conjugate-13 05/24/2014  . Pneumococcal Polysaccharide-23 01/08/2009  . Tdap 01/28/2012  . 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   Preventative care: Last colonoscopy: 2018 Cologuard 2018 (positive) Last mammogram: 10/2019 Last pap smear/pelvic exam: 2019 Dr. Ulanda Edison, declines another  DEXA: 02/2016 normal, DUE CXR 12/2012 MRI LUMBAR 2009  Prior vaccinations: TD or Tdap: 2013  Influenza: 2019 Pneumococcal: 2010 Prevnar13: 2015 Shingles/Zostavax: 2008 Shringrix: Discussed with patient  Names of Other Physician/Practitioners you currently use: 1.  [facility]- here for primary care 2. [place], eye doctor, last visit 2020 3. Dr. Marland Kitchen  dentist, last visit 10/2019  Patient Care Team: Unk Pinto, MD  as PCP - General (Internal Medicine) [place] Dragon, MD (Inactive) as Consulting Physician (Gastroenterology) Newton Pigg, MD as Consulting Physician (Obstetrics and Gynecology)   Allergies Allergies  Allergen Reactions  . Oxycodone-Acetaminophen Other (See Comments)    panic attack  . Vit C-Cholecalciferol-Rose Hip [Cholecalciferol-Vitamin C] Other (See Comments)    dyspepsia  . Ibuprofen Hives  . Macrodantin [Nitrofurantoin Macrocrystal] Rash    SURGICAL HISTORY She  has a past surgical history that includes right shoulder humerus fx (july 2003); right hand ulner nerve surgery (March 26, 2002); right elbow   surgery (March 26, 2002); right wrist plating (2004); right total hip arthroplasty (2008); right total knee arthroplasty (sept 2012); Tonsillectomy (age 62); Total hip arthroplasty (10/22/2011); Colonoscopy; and Dilation and curettage of uterus. FAMILY HISTORY Her family history includes Atrial fibrillation in her brother, brother, and mother; Breast cancer in her maternal aunt, maternal grandmother, and paternal aunt; Diabetes in her father and mother; Heart attack (age of onset: 56) in her father; Heart disease in her father and mother; Hypertension in her mother. SOCIAL HISTORY She  reports that she has never smoked. She has never used smokeless tobacco. She reports current alcohol use. She reports that she does not use drugs.  MEDICARE WELLNESS OBJECTIVES: Physical activity: Current Exercise Habits: Home exercise routine, Type of exercise: walking(Yard work), Time (Minutes): 30, Frequency (Times/Week): 4, Weekly Exercise (Minutes/Week): 120, Intensity: Mild, Exercise limited by: None identified Cardiac risk factors: Cardiac Risk Factors include: advanced age (>8men, >24 women);dyslipidemia;hypertension Depression/mood screen:   Depression screen [facility] 2/9 01/30/2020  Decreased Interest 0  Down, Depressed, Hopeless 0  PHQ - 2 Score 0    ADLs:  In your present state of health, do  you have any difficulty performing the following activities: 01/30/2020 10/17/2019  Hearing? N N  Vision? N N  Difficulty concentrating or making decisions? N N  Walking or climbing stairs? N N  Dressing or bathing? N N  Doing errands, shopping? N N  Preparing Food and eating ? N -  Using the Toilet? N -  In the past six months, have you accidently leaked urine? N -  Do you have problems with loss of bowel control? N -  Managing your Medications? N -  Managing your Finances? N -  Housekeeping or managing your Housekeeping? N -  Some recent data might be hidden    Cognitive Testing  Alert? Yes  Normal Appearance?Yes  Oriented to person? Yes  Place? Yes   Time? Yes  Recall of three objects?  Yes  Can perform simple calculations? Yes  Displays appropriate judgment?Yes  Can read the correct time from a watch face?Yes  EOL planning: Does Patient Have a Medical Advance Directive?: Yes Type of Advance Directive: Healthcare Power of Attorney, Living will Does patient want to make changes to medical advance directive?: No - Patient declined Copy of [place] in Chart?: No - copy requested  Objective:   Today's Vitals   01/30/20 0907  BP: 124/68  Pulse: 73  Temp: (!) 97.3 F (36.3 C)  SpO2: 97%  Weight: 160 lb 9.6 oz (72.8 kg)  Height: 5\' 2"  (1.575 m)  PainSc: 1   PainLoc: Shoulder   Body mass index is 29.37 kg/m.  General : Well sounding patient in no apparent distress HEENT: no hoarseness, no cough for duration of visit Lungs: speaks in complete sentences, no audible wheezing, no apparent distress Neurological: alert, oriented x 3 Psychiatric: pleasant, judgement appropriate    Medicare Attestation I have personally reviewed: The patient's medical and social history Their use of alcohol, tobacco or illicit drugs Their current medications and supplements The patient's functional ability including ADLs,fall risks, home safety risks, cognitive, and  hearing and visual impairment Diet and physical activities Evidence for depression or mood disorders  The patient's weight, height, BMI, and visual acuity have been recorded in the chart.  I have made referrals, counseling, and provided education to the patient based on review of the above and I have provided the patient with a written personalized care plan for preventive services.     Garnet Sierras, NP   01/30/2020

## 2020-01-30 NOTE — Patient Instructions (Signed)
declined

## 2020-02-22 ENCOUNTER — Other Ambulatory Visit: Payer: Self-pay | Admitting: Internal Medicine

## 2020-02-22 DIAGNOSIS — G90519 Complex regional pain syndrome I of unspecified upper limb: Secondary | ICD-10-CM

## 2020-03-20 ENCOUNTER — Other Ambulatory Visit: Payer: Self-pay | Admitting: Internal Medicine

## 2020-03-20 DIAGNOSIS — H04123 Dry eye syndrome of bilateral lacrimal glands: Secondary | ICD-10-CM | POA: Diagnosis not present

## 2020-03-20 DIAGNOSIS — H16103 Unspecified superficial keratitis, bilateral: Secondary | ICD-10-CM | POA: Diagnosis not present

## 2020-03-20 DIAGNOSIS — H401232 Low-tension glaucoma, bilateral, moderate stage: Secondary | ICD-10-CM | POA: Diagnosis not present

## 2020-04-17 DIAGNOSIS — H401232 Low-tension glaucoma, bilateral, moderate stage: Secondary | ICD-10-CM | POA: Diagnosis not present

## 2020-04-17 DIAGNOSIS — H04123 Dry eye syndrome of bilateral lacrimal glands: Secondary | ICD-10-CM | POA: Diagnosis not present

## 2020-04-17 DIAGNOSIS — H16103 Unspecified superficial keratitis, bilateral: Secondary | ICD-10-CM | POA: Diagnosis not present

## 2020-04-23 ENCOUNTER — Other Ambulatory Visit: Payer: Self-pay | Admitting: Internal Medicine

## 2020-04-24 ENCOUNTER — Ambulatory Visit: Payer: PPO | Admitting: Internal Medicine

## 2020-05-09 ENCOUNTER — Encounter: Payer: Self-pay | Admitting: Internal Medicine

## 2020-05-09 NOTE — Progress Notes (Signed)
History of Present Illness:       This very nice 79 y.o.  [name] presents for 6 month follow up with HTN, HLD, Pre-Diabetes and Vitamin D Deficiency.  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        Patient is treated for HTN (2008) & BP has been controlled at home. Today's BP is at goal - 104/72. Patient has had no complaints of any cardiac type chest pain, palpitations, dyspnea / orthopnea / PND, dizziness, claudication, or dependent edema.       Hyperlipidemia is controlled with diet & Crestor. Patient denies myalgias or other med SE's. Last Lipids were at goal:  Lab Results  Component Value Date   CHOL 142 01/30/2020   HDL 57 01/30/2020   LDLCALC 72 01/30/2020   TRIG 57 01/30/2020   CHOLHDL 2.5 01/30/2020    Also, the patient has history of PreDiabetes (A1c6.0% / 2011)  and has had no symptoms of reactive hypoglycemia, diabetic polys, paresthesias or visual blurring.  Last A1c was Normal & at goal:  Lab Results  Component Value Date   HGBA1C 5.6 10/18/2019           Further, the patient also has history of Vitamin D Deficiency and supplements vitamin D without any suspected side-effects. Last vitamin D was at goal:  Lab Results  Component Value Date   VD25OH 69 10/18/2019    Current Outpatient Medications on File Prior to Visit  Medication Sig  . ALPRAZolam (XANAX) 0.5 MG tablet TAKE 1/2 TO 1 (ONE-HALF TO ONE) TABLET BY MOUTH THREE TIMES DAILY AS NEEDED FOR ANXIETY  . aspirin EC 81 MG tablet Take 162 mg by mouth at bedtime.  . Cholecalciferol (VITAMIN D) 2000 units CAPS Take 1 capsule by mouth daily.  . furosemide (LASIX) 40 MG tablet Take 1 tablet (40 mg total) by mouth as needed for edema.  Marland Kitchen losartan-hydrochlorothiazide (HYZAAR) 50-12.5 MG tablet Take 1 tablet Daily for BP  . montelukast (SINGULAIR) 10 MG tablet Take 1 tablet Daily for Allergies  . Naproxen Sodium (ALEVE) 220 MG CAPS Take by mouth as needed.   . Omega-3 Fatty Acids  (FISH OIL) 1000 MG CAPS Take by mouth 2 (two) times daily.  . pregabalin (LYRICA) 150 MG capsule Take 1 capsule at Bedtime for Chronic Pain  . pyridOXINE (VITAMIN B-6) 100 MG tablet Take 100 mg by mouth 2 (two) times a day.   . rosuvastatin (CRESTOR) 40 MG tablet Take 1/2 to 1 tablet Daily for Cholesterol  . timolol (TIMOPTIC) 0.5 % ophthalmic solution Place 1 drop into both eyes every morning.   . triamcinolone ointment (KENALOG) 0.1 % Apply 1 application topically 2 (two) times daily.  Marland Kitchen estradiol (ESTRACE) 1 MG tablet TAKE 1 TABLET EVERY DAY (Patient not taking: Reported on 05/10/2020)  . medroxyPROGESTERone (PROVERA) 2.5 MG tablet TAKE 1 TABLET EVERY DAY (Patient not taking: Reported on 05/10/2020)   No current facility-administered medications on file prior to visit.    Allergies  Allergen Reactions  . Oxycodone-Acetaminophen Other (See Comments)    panic attack  . Vit C-Cholecalciferol-Rose Hip [Cholecalciferol-Vitamin C] Other (See Comments)    dyspepsia  . Ibuprofen Hives  . Macrodantin [Nitrofurantoin Macrocrystal] Rash    PMHx:   Past Medical History:  Diagnosis Date  . Allergy   . Anemia   . Arthritis   . Blood transfusion without reported diagnosis   . Cataract  removed both eyes   . Cervical radicular pain 02/11/2016  . Glaucoma    low pressure glaucoma   . Hyperlipidemia   . Hypertension   . Osteopenia   . Pelvis fracture [facility]) March 26, 2002   both sides fx  . Positive colorectal cancer screening using Cologuard test 04/14/2017  . Pre-diabetes   . 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 Vitamin D deficiency     Immunization History  Administered Date(s) Administered  . Influenza, High Dose Seasonal PF 06/06/2014, 05/14/2016, 06/24/2018, 06/05/2019  . Influenza-Unspecified 06/13/2013, 05/02/2015, 06/24/2018  . Moderna SARS-COVID-2 Vaccination 09/13/2019, 10/11/2019  . Pneumococcal Conjugate-13 05/24/2014  . Pneumococcal Polysaccharide-23 01/08/2009   . Tdap 01/28/2012  . Zoster 08/31/2006    Past Surgical History:  Procedure Laterality Date  . COLONOSCOPY    . DILATION AND CURETTAGE OF UTERUS     age 86   . right elbow   surgery  March 26, 2002  . right hand ulner nerve surgery  March 26, 2002   I and d done right hand/wrist  . right shoulder humerus fx  july 2003   surgery done  . right total hip arthroplasty  2008  . right total knee arthroplasty  sept 2012  . right wrist plating  2004  . TONSILLECTOMY  age 48  . TOTAL HIP ARTHROPLASTY  10/22/2011   Procedure: TOTAL HIP ARTHROPLASTY ANTERIOR APPROACH;  Surgeon: Mauri Pole, MD;  Location: [facility];  Service: Orthopedics;  Laterality: Left;  Left Total Hip Arthroplasty,  Anterior Approach     FHx:    Reviewed / unchanged  SHx:    Reviewed / unchanged   Systems Review:  Constitutional: Denies fever, chills, wt changes, headaches, insomnia, fatigue, night sweats, change in appetite. Eyes: Denies redness, blurred vision, diplopia, discharge, itchy, watery eyes.  ENT: Denies discharge, congestion, post nasal drip, epistaxis, sore throat, earache, hearing loss, dental pain, tinnitus, vertigo, sinus pain, snoring.  CV: Denies chest pain, palpitations, irregular heartbeat, syncope, dyspnea, diaphoresis, orthopnea, PND, claudication or edema. Respiratory: denies cough, dyspnea, DOE, pleurisy, hoarseness, laryngitis, wheezing.  Gastrointestinal: Denies dysphagia, odynophagia, heartburn, reflux, water brash, abdominal pain or cramps, nausea, vomiting, bloating, diarrhea, constipation, hematemesis, melena, hematochezia  or hemorrhoids. Genitourinary: Denies dysuria, frequency, urgency, nocturia, hesitancy, discharge, hematuria or flank pain. Musculoskeletal: Denies arthralgias, myalgias, stiffness, jt. swelling, pain, limping or strain/sprain.  Skin: Denies pruritus, rash, hives, warts, acne, eczema or change in skin lesion(s). Neuro: No weakness, tremor, incoordination, spasms,  paresthesia or pain. Psychiatric: Denies confusion, memory loss or sensory loss. Endo: Denies change in weight, skin or hair change.  Heme/Lymph: No excessive bleeding, bruising or enlarged lymph nodes.  Physical Exam  BP 104/72   Pulse (!) 56   Temp (!) 97 F (36.1 C)   Resp 16   Ht 5\' 2"  (1.575 m)   Wt 158 lb 11.2 oz (72 kg)   BMI 29.03 kg/m   Appears  well nourished, well groomed  and in no distress.  Eyes: PERRLA, EOMs, conjunctiva no swelling or erythema. Sinuses: No frontal/maxillary tenderness ENT/Mouth: EAC's clear, TM's nl w/o erythema, bulging. Nares clear w/o erythema, swelling, exudates. Oropharynx clear without erythema or exudates. Oral hygiene is good. Tongue normal, non obstructing. Hearing intact.  Neck: Supple. Thyroid not palpable. Car 2+/2+ without bruits, nodes or JVD. Chest: Respirations nl with BS clear & equal w/o rales, rhonchi, wheezing or stridor.  Cor: Heart sounds normal w/ regular rate and rhythm  without sig. murmurs, gallops, clicks or rubs. Peripheral pulses normal and equal  without edema.  Abdomen: Soft & bowel sounds normal. Non-tender w/o guarding, rebound, hernias, masses or organomegaly.  Lymphatics: Unremarkable.  Musculoskeletal: Full ROM all peripheral extremities, joint stability, 5/5 strength and normal gait.  Skin: Warm, dry without exposed rashes, lesions or ecchymosis apparent.  Neuro: Cranial nerves intact, reflexes equal bilaterally. Sensory-motor testing grossly intact. Tendon reflexes grossly intact.  Pysch: Alert & oriented x 3.  Insight and judgement nl & appropriate. No ideations.  Assessment and Plan:  1. Essential hypertension  - Continue medication, monitor blood pressure at home.  - Continue DASH diet.  Reminder to go to the ER if any CP,  SOB, nausea, dizziness, severe HA, changes vision/speech.  - CBC with Differential/Platelet - COMPLETE METABOLIC PANEL WITH GFR - Magnesium - TSH  2. Hyperlipidemia, mixed  -  Continue diet/meds, exercise,& lifestyle modifications.  - Continue monitor periodic cholesterol/liver & renal functions   - Lipid panel - TSH  3. Abnormal glucose  - Continue diet, exercise  - Lifestyle modifications.  - Monitor appropriate labs.  - Hemoglobin A1c - Insulin, random  4. Vitamin D deficiency  - Continue supplementation.  - VITAMIN D 25 Hydroxy   5. Complex regional pain syndrome    6. Medication management  - CBC with Differential/Platelet - COMPLETE METABOLIC PANEL WITH GFR - Magnesium - Lipid panel - TSH - Hemoglobin A1c - Insulin, random - VITAMIN D 25 Hydroxy        Discussed  regular exercise, BP monitoring, weight control to achieve/maintain BMI less than 25 and discussed med and SE's. Recommended labs to assess and monitor clinical status with further disposition pending results of labs.  I discussed the assessment and treatment plan with the patient. The patient was provided an opportunity to ask questions and all were answered. The patient agreed with the plan and demonstrated an understanding of the instructions.  I provided over 30 minutes of exam, counseling, chart review and  complex critical decision making.   Kirtland Bouchard, MD

## 2020-05-09 NOTE — Patient Instructions (Signed)

## 2020-05-10 ENCOUNTER — Other Ambulatory Visit: Payer: Self-pay

## 2020-05-10 ENCOUNTER — Ambulatory Visit (INDEPENDENT_AMBULATORY_CARE_PROVIDER_SITE_OTHER): Payer: PPO | Admitting: Internal Medicine

## 2020-05-10 VITALS — BP 104/72 | HR 56 | Temp 97.0°F | Resp 16 | Ht 62.0 in | Wt 158.7 lb

## 2020-05-10 DIAGNOSIS — E782 Mixed hyperlipidemia: Secondary | ICD-10-CM | POA: Diagnosis not present

## 2020-05-10 DIAGNOSIS — E559 Vitamin D deficiency, unspecified: Secondary | ICD-10-CM

## 2020-05-10 DIAGNOSIS — I10 Essential (primary) hypertension: Secondary | ICD-10-CM

## 2020-05-10 DIAGNOSIS — R7309 Other abnormal glucose: Secondary | ICD-10-CM

## 2020-05-10 DIAGNOSIS — Z79899 Other long term (current) drug therapy: Secondary | ICD-10-CM | POA: Diagnosis not present

## 2020-05-10 DIAGNOSIS — G90519 Complex regional pain syndrome I of unspecified upper limb: Secondary | ICD-10-CM

## 2020-05-11 NOTE — Progress Notes (Signed)
========================================================== -   Test results slightly outside the reference range are not unusual. If there is anything important, I will review this with you,  otherwise it is considered normal test values.  If you have further questions,  please do not hesitate to contact me at the office or via My Chart.  ==========================================================  -  Creatinine is a little elevated which calculates the GFR of Kidney Flow  Rate is decreased from Stage 2 to Stage 3a Chronic kidney disease.   Most likely this represents relative dehydration suggesting you are NOT  drinking enough fluids.   Usually recommend that you drink at least equivalent of 5 bottles (16 oz ) of  water or fluids /day  ( 5 x 16 oz = 80 oz /day )  ==========================================================  -   Magnesium  -   1.8  -  very  low- goal is betw 2.0 - 2.5,   - So..............Marland Kitchen  Recommend that you take  Magnesium 500 mg tablet daily   - also important to eat lots of  leafy green vegetables   - spinach - Kale - collards - greens - okra - asparagus  - broccoli - quinoa - squash - almonds   - black, red, white beans  -  peas - green beans ==========================================================  -  Total Chol = 148 and LDL Chol = 67 - Both  Excellent   - Very low risk for Heart Attack  / Stroke ==========================================================  - A1c - Normal - Great ! - No Diabetes ==========================================================  -  Vitamin D = 69 - Excellent  ==========================================================  -  All Else - CBC - Kidneys - Electrolytes - Liver - Magnesium & Thyroid    - all  Normal / OK ==========================================================   - Keep up the [place] Work ! ==========================================================

## 2020-05-13 ENCOUNTER — Other Ambulatory Visit: Payer: Self-pay | Admitting: Internal Medicine

## 2020-05-13 DIAGNOSIS — G90519 Complex regional pain syndrome I of unspecified upper limb: Secondary | ICD-10-CM

## 2020-05-13 LAB — HEMOGLOBIN A1C
Hgb A1c MFr Bld: 5.4 % of total Hgb (ref <5.7)
Mean Plasma Glucose: 108 (calc)
eAG (mmol/L): 6 (calc)

## 2020-05-13 LAB — COMPLETE METABOLIC PANEL WITH GFR
AG Ratio: 1.5 (calc) (ref 1.0–2.5)
ALT: 32 U/L — ABNORMAL HIGH (ref 6–29)
AST: 36 U/L — ABNORMAL HIGH (ref 10–35)
Albumin: 4.1 g/dL (ref 3.6–5.1)
Alkaline phosphatase (APISO): 82 U/L (ref 37–153)
BUN/Creatinine Ratio: 26 (calc) — ABNORMAL HIGH (ref 6–22)
BUN: 27 mg/dL — ABNORMAL HIGH (ref 7–25)
CO2: 31 mmol/L (ref 20–32)
Calcium: 9 mg/dL (ref 8.6–10.4)
Chloride: 100 mmol/L (ref 98–110)
Creat: 1.03 mg/dL — ABNORMAL HIGH (ref 0.60–0.93)
GFR, Est African American: 60 mL/min/{1.73_m2} (ref >=60)
GFR, Est Non African American: 52 mL/min/{1.73_m2} — ABNORMAL LOW (ref >=60)
Globulin: 2.7 g/dL (calc) (ref 1.9–3.7)
Glucose, Bld: 84 mg/dL (ref 65–99)
Potassium: 4.2 mmol/L (ref 3.5–5.3)
Sodium: 138 mmol/L (ref 135–146)
Total Bilirubin: 0.5 mg/dL (ref 0.2–1.2)
Total Protein: 6.8 g/dL (ref 6.1–8.1)

## 2020-05-13 LAB — LIPID PANEL
Cholesterol: 148 mg/dL (ref <200)
HDL: 67 mg/dL (ref >=50)
LDL Cholesterol (Calc): 67 mg/dL (calc)
Non-HDL Cholesterol (Calc): 81 mg/dL (calc) (ref <130)
Total CHOL/HDL Ratio: 2.2 (calc) (ref <5.0)
Triglycerides: 60 mg/dL (ref <150)

## 2020-05-13 LAB — CBC WITH DIFFERENTIAL/PLATELET
Absolute Monocytes: 617 cells/uL (ref 200–950)
Basophils Absolute: 38 cells/uL (ref 0–200)
Basophils Relative: 0.6 %
Eosinophils Absolute: 239 cells/uL (ref 15–500)
Eosinophils Relative: 3.8 %
HCT: 39.2 % (ref 35.0–45.0)
Hemoglobin: 13 g/dL (ref 11.7–15.5)
Lymphs Abs: 2003 cells/uL (ref 850–3900)
MCH: 30.6 pg (ref 27.0–33.0)
MCHC: 33.2 g/dL (ref 32.0–36.0)
MCV: 92.2 fL (ref 80.0–100.0)
MPV: 12.2 fL (ref 7.5–12.5)
Monocytes Relative: 9.8 %
Neutro Abs: 3402 cells/uL (ref 1500–7800)
Neutrophils Relative %: 54 %
Platelets: 253 10*3/uL (ref 140–400)
RBC: 4.25 10*6/uL (ref 3.80–5.10)
RDW: 13 % (ref 11.0–15.0)
Total Lymphocyte: 31.8 %
WBC: 6.3 10*3/uL (ref 3.8–10.8)

## 2020-05-13 LAB — VITAMIN D 25 HYDROXY (VIT D DEFICIENCY, FRACTURES): Vit D, 25-Hydroxy: 69 ng/mL (ref 30–100)

## 2020-05-13 LAB — INSULIN, RANDOM: Insulin: 6.9 u[IU]/mL

## 2020-05-13 LAB — MAGNESIUM: Magnesium: 1.8 mg/dL (ref 1.5–2.5)

## 2020-05-13 LAB — TSH: TSH: 1.9 mIU/L (ref 0.40–4.50)

## 2020-05-13 MED ORDER — PREGABALIN 150 MG PO CAPS: ORAL_CAPSULE | ORAL | 1 refills | Status: DC

## 2020-07-22 ENCOUNTER — Other Ambulatory Visit: Payer: Self-pay | Admitting: Internal Medicine

## 2020-08-02 DIAGNOSIS — H401232 Low-tension glaucoma, bilateral, moderate stage: Secondary | ICD-10-CM | POA: Diagnosis not present

## 2020-08-05 DIAGNOSIS — H5211 Myopia, right eye: Secondary | ICD-10-CM | POA: Diagnosis not present

## 2020-08-05 DIAGNOSIS — H04123 Dry eye syndrome of bilateral lacrimal glands: Secondary | ICD-10-CM | POA: Diagnosis not present

## 2020-08-05 DIAGNOSIS — H43813 Vitreous degeneration, bilateral: Secondary | ICD-10-CM | POA: Diagnosis not present

## 2020-08-05 DIAGNOSIS — H401232 Low-tension glaucoma, bilateral, moderate stage: Secondary | ICD-10-CM | POA: Diagnosis not present

## 2020-08-13 ENCOUNTER — Ambulatory Visit: Payer: PPO | Admitting: Adult Health Nurse Practitioner

## 2020-08-14 ENCOUNTER — Other Ambulatory Visit: Payer: Self-pay

## 2020-08-14 ENCOUNTER — Ambulatory Visit (INDEPENDENT_AMBULATORY_CARE_PROVIDER_SITE_OTHER): Payer: PPO | Admitting: Adult Health Nurse Practitioner

## 2020-08-14 ENCOUNTER — Encounter: Payer: Self-pay | Admitting: Adult Health Nurse Practitioner

## 2020-08-14 VITALS — BP 120/78 | HR 71 | Temp 97.5°F | Ht 62.0 in | Wt 161.0 lb

## 2020-08-14 DIAGNOSIS — E663 Overweight: Secondary | ICD-10-CM

## 2020-08-14 DIAGNOSIS — R7309 Other abnormal glucose: Secondary | ICD-10-CM

## 2020-08-14 DIAGNOSIS — I10 Essential (primary) hypertension: Secondary | ICD-10-CM

## 2020-08-14 DIAGNOSIS — E559 Vitamin D deficiency, unspecified: Secondary | ICD-10-CM | POA: Diagnosis not present

## 2020-08-14 DIAGNOSIS — H4010X0 Unspecified open-angle glaucoma, stage unspecified: Secondary | ICD-10-CM

## 2020-08-14 DIAGNOSIS — Z79899 Other long term (current) drug therapy: Secondary | ICD-10-CM

## 2020-08-14 DIAGNOSIS — M47812 Spondylosis without myelopathy or radiculopathy, cervical region: Secondary | ICD-10-CM

## 2020-08-14 DIAGNOSIS — E782 Mixed hyperlipidemia: Secondary | ICD-10-CM | POA: Diagnosis not present

## 2020-08-14 DIAGNOSIS — F411 Generalized anxiety disorder: Secondary | ICD-10-CM | POA: Diagnosis not present

## 2020-08-14 MED ORDER — LOSARTAN POTASSIUM-HCTZ 50-12.5 MG PO TABS: ORAL_TABLET | ORAL | 1 refills | Status: DC

## 2020-08-14 NOTE — Progress Notes (Signed)
FOLLOW UP 3 MONTH  Assessment:   Gwendolyn [name] was seen today for follow-up and medicare wellness.  Diagnoses and all orders for this visit:  Essential hypertension Continue current medications: Losartan/HCTZ 50/12/5mg  Has Lasix 40mg , takes once a week or every other weeks. Monitor blood pressure at home; call if consistently over 130/80 Continue DASH diet.   Reminder to go to the ER if any CP, SOB, nausea, dizziness, severe HA, changes vision/speech, left arm numbness and tingling and jaw pain.  -     CBC with Differential/Platelet -     COMPLETE METABOLIC PANEL WITH GFR  Hyperlipidemia, mixed Continue medications: rosuvastatin 40mg  three times a week Takes Fish oil 1,000mg  BID Discussed dietary and exercise modifications Low fat diet -     Lipid panel  Abnormal glucose Discussed dietary and exercise modifications  Overweight (BMI 25.0-29.9) Discussed dietary and exercise modifications  Generalized anxiety disorder Doing well on current regiment Continue alprazolam 0.5mg  half to one TID PRN Discussed stress management techniques   Discussed good sleep hygiene Discussed increasing physical activity and exercise Increase water intake  Open-angle glaucoma, unspecified glaucoma stage, unspecified laterality, unspecified open-angle glaucoma type Continue drops as prescribed Yearly eye exams Doing well at this time  Cervical arthritis/radicular pain Doing well at this time Lyrica helping with this Follow with with as needed  Vitamin D deficiency Continue supplementation to maintain goal of 60-100 Taking Vitamin D 2,000 IU daily  Estrogen deficiency Taking estradiol and provera, has self tapered half doses. No hot flashes noted Will trial stopping this.  No longer follows with GYN for refils Due for DEXA, will place order today.  Medication management Continued   Over 30 minutes of face to face interview, exam, counseling, chart review, and critical decision making  was performed.  Future Appointments  Date Time Provider [place]  10/30/2020 10:00 AM Unk Pinto, MD [facility] None  02/04/2021  9:30 AM Garnet Sierras, NP [facility] None     HPI:   Gwendolyn [name] is a 79 y.o. female who presents for3 month follow up on HTN, prediabetes, HLD, vitamin D def.  She has hx of anxiety, has been prescribed xanax though improved.  She reports last use was over one month ago. She was on estrogen and progesterone in the past.  She has stopped this., she is on bASA, She is taking Lyrica for 150mg  daily for hot flashes.  She reports they have been reduce.  She main has them at night.  Reports that she is  Able to deal with this.   Cervical arthritis/radicular pain, managing fairly with lifestyle modification (sleeping on back, flatter pillow), using aleve PRN, lyrica once daily.    BMI is Body mass index is 29.45 kg/m., she has been working on diet and exercise. She is on weight watchers and following weekly via zoom.  Wt Readings from Last 3 Encounters:  08/14/20 161 lb (73 kg)  05/10/20 158 lb 11.2 oz (72 kg)  01/30/20 160 lb 9.6 oz (72.8 kg)   Her blood pressure has been controlled at home, today their BP is BP: 120/78  She does workout, goes to [employer] 3 days a week. She denies chest pain, shortness of breath, dizziness. She takes lasix PRN needed for swelling but has severe leg cramps that night in her ankles, takes the kdur 56meq with it but still with pain.  Has not taken this lately.   She is on cholesterol medication and has myalgias with crestor (20 mg three times  a week) on the days she takes it. Her cholesterol is at goal. The cholesterol last visit was:   Lab Results  Component Value Date   CHOL 148 05/10/2020   HDL 67 05/10/2020   LDLCALC 67 05/10/2020   TRIG 60 05/10/2020   CHOLHDL 2.2 05/10/2020    She has been working on diet and exercise for glucose management, and denies paresthesia of the feet,  polydipsia, polyuria and visual disturbances. Last A1C in the office was:  Lab Results  Component Value Date   HGBA1C 5.4 05/10/2020   Lab Results  Component Value Date   GFRNONAA 71 (L) 05/10/2020   Patient is on Vitamin D supplement.   Lab Results  Component Value Date   VD25OH 69 05/10/2020       Medication Review Current Outpatient Medications on File Prior to Visit  Medication Sig  . ALPRAZolam (XANAX) 0.5 MG tablet TAKE 1/2 TO 1 (ONE-HALF TO ONE) TABLET BY MOUTH THREE TIMES DAILY AS NEEDED FOR ANXIETY  . aspirin EC 81 MG tablet Take 162 mg by mouth at bedtime.  . Cholecalciferol (VITAMIN D) 2000 units CAPS Take 1 capsule by mouth daily.  . furosemide (LASIX) 40 MG tablet Take 1 tablet (40 mg total) by mouth as needed for edema.  Marland Kitchen losartan-hydrochlorothiazide (HYZAAR) 50-12.5 MG tablet Take      1 tablet       Daily        for BP  . montelukast (SINGULAIR) 10 MG tablet Take 1 tablet Daily for Allergies  . Naproxen Sodium 220 MG CAPS Take by mouth as needed.   . Omega-3 Fatty Acids (FISH OIL) 1000 MG CAPS Take by mouth 2 (two) times daily.  . pregabalin (LYRICA) 150 MG capsule Take 1 capsule    4 x /day      for Chronic Pain  . pyridOXINE (VITAMIN B-6) 100 MG tablet Take 100 mg by mouth 2 (two) times a day.   . rosuvastatin (CRESTOR) 40 MG tablet Take 1/2 to 1 tablet Daily for Cholesterol  . timolol (TIMOPTIC) 0.5 % ophthalmic solution Place 1 drop into both eyes every morning.   . triamcinolone ointment (KENALOG) 0.1 % Apply 1 application topically 2 (two) times daily.   No current facility-administered medications on file prior to visit.    Current Problems (verified) Patient Active Problem List   Diagnosis Date Noted  . Cervical arthritis 09/27/2017  . Open-angle glaucoma 05/14/2016  . Generalized anxiety disorder 02/11/2016  . Overweight (BMI 25.0-29.9) 04/25/2015  . Medication management 02/12/2014  . Essential hypertension 08/08/2013  . Hyperlipidemia, mixed  08/08/2013  . Abnormal glucose 08/08/2013  . Vitamin D deficiency 08/08/2013    Screening Tests Immunization History  Administered Date(s) Administered  . Influenza, High Dose Seasonal PF 06/06/2014, 05/14/2016, 06/24/2018, 06/05/2019, 06/13/2020  . Influenza-Unspecified 06/13/2013, 05/02/2015, 06/24/2018  . Moderna Sars-Covid-2 Vaccination 09/13/2019, 10/11/2019  . Pneumococcal Conjugate-13 05/24/2014  . Pneumococcal Polysaccharide-23 01/08/2009  . Tdap 01/28/2012  . 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   Preventative care: Last colonoscopy: 2018 Cologuard 2018 (positive) Last mammogram: 10/2019 Last pap smear/pelvic exam: 2019 Dr. Ulanda Edison, declines another  DEXA: 02/2016 normal, DUE order placed to have with next mammorgram CXR 12/2012 MRI LUMBAR 2009  Prior vaccinations: TD or Tdap: 2013  Influenza: 10/21 Pneumococcal: 2010 Prevnar13: 2015 Shingles/Zostavax: 2008 Shringrix: Discussed with patient SARS-COV2-Moderna: Booster : 07/2020 - [facility]  Names of Other Physician/Practitioners you currently use: 1. [facility]- here for primary care 2. [place], eye doctor, last visit 2020 3. Dr. Marland Kitchen  dentist, last visit 10/2019  Patient Care Team: Unk Pinto, MD as PCP - General (Internal Medicine) Lafayette Dragon, MD (Inactive) as Consulting Physician (Gastroenterology) Newton Pigg, MD as Consulting Physician (Obstetrics and Gynecology)   Allergies Allergies  Allergen Reactions  . Oxycodone-Acetaminophen Other (See Comments)    panic attack  . Vit C-Cholecalciferol-Rose Hip [Cholecalciferol-Vitamin C] Other (See Comments)    dyspepsia  . Ibuprofen Hives  . Macrodantin [Nitrofurantoin Macrocrystal] Rash    SURGICAL HISTORY She  has a past surgical history that includes right shoulder humerus fx (july 2003); right hand ulner nerve surgery (March 26, 2002); right elbow   surgery (March 26, 2002); right wrist plating (2004); right total hip arthroplasty (2008); right total knee arthroplasty (sept 2012); Tonsillectomy (age 80); Total hip arthroplasty (10/22/2011); Colonoscopy; and Dilation and curettage of uterus. FAMILY HISTORY Her family history includes Atrial fibrillation in her brother, brother, and mother; Breast cancer in her maternal aunt, maternal grandmother, and paternal aunt; Diabetes in her father and mother; Heart attack (age of onset: 55) in her father; Heart disease in her father and mother; Hypertension in her mother. SOCIAL HISTORY She  reports that she has never smoked. She has never used smokeless tobacco. She reports current alcohol use. She reports that she does not use drugs.     Objective:   Today's Vitals   08/14/20 1426  BP: 120/78  Pulse: 71  Temp: (!) 97.5 F (36.4 C)  SpO2: 97%  Weight: 161 lb (73 kg)  Height: 5\' 2"  (1.575 m)   Body mass index is 29.45 kg/m.  General Appearance: Well nourished, in no apparent distress. Eyes: PERRLA, EOMs, conjunctiva no swelling or erythema Sinuses: No Frontal/maxillary tenderness ENT/Mouth: Ext aud canals clear, TMs without erythema, bulging. No erythema, swelling, or exudate on post pharynx.  Tonsils not swollen or erythematous. Hearing normal.  Neck: Supple, thyroid normal.  Respiratory: Respiratory effort normal, BS equal bilaterally without rales, rhonchi, wheezing or stridor.  Cardio: RRR with no MRGs. Brisk peripheral pulses without edema on right. Left +1 pitting.  Abdomen: Soft, + BS.  Non tender, no guarding, rebound, hernias, masses. Lymphatics: Non tender without lymphadenopathy.  Musculoskeletal: Full ROM, 5/5 strength, normal gait.  Skin: Warm, dry without rashes, lesions, ecchymosis.  Neuro: Cranial nerves intact. Normal muscle tone, no cerebellar symptoms. Sensation intact.  Psych: Awake and oriented X 3, normal affect, Insight and Judgment appropriate.    Garnet Sierras, Laqueta Jean,  DNP [facility] 08/14/2020  2:58 PM

## 2020-08-14 NOTE — Patient Instructions (Addendum)
We will contact you with lab results in 1-3 days via MyChart.   Ask insurance and pharmacy about shingrix - it is a 2 part shot that we will not be getting in the office.   Suggest getting AFTER covid vaccines, have to wait at least a month This shot can make you feel bad due to such good immune response it can trigger some inflammation so take tylenol or aleve day of or day after and plan on resting.   Can go to [email or web address] for more information  Shingrix Vaccination  Two vaccines are licensed and recommended to prevent shingles in the [place] Zoster vaccine live (ZVL, Zostavax) has been in use since 2006. Recombinant zoster vaccine (RZV, Shingrix), has been in use since 2017 and is recommended by ACIP as the preferred shingles vaccine.  What Everyone Should Know about Shingles Vaccine (Shingrix) One of the Recommended Vaccines by Disease Shingles vaccination is the only way to protect against shingles and postherpetic neuralgia (PHN), the most common complication from shingles. CDC recommends that healthy adults 50 years and older get two doses of the shingles vaccine called Shingrix (recombinant zoster vaccine), separated by 2 to 6 months, to prevent shingles and the complications from the disease. Your doctor or pharmacist can give you Shingrix as a shot in your upper arm. Shingrix provides strong protection against shingles and PHN. Two doses of Shingrix is more than 90% effective at preventing shingles and PHN. Protection stays above 85% for at least the first four years after you get vaccinated. Shingrix is the preferred vaccine, over Zostavax (zoster vaccine live), a shingles vaccine in use since 2006. Zostavax may still be used to prevent shingles in healthy adults 60 years and older. For example, you could use Zostavax if a person is allergic to Shingrix, prefers Zostavax, or requests immediate vaccination and Shingrix is  unavailable. Who Should Get Shingrix? Healthy adults 50 years and older should get two doses of Shingrix, separated by 2 to 6 months. You should get Shingrix even if in the past you . had shingles  . received Zostavax  . are not sure if you had chickenpox There is no maximum age for getting Shingrix. If you had shingles in the past, you can get Shingrix to help prevent future occurrences of the disease. There is no specific length of time that you need to wait after having shingles before you can receive Shingrix, but generally you should make sure the shingles rash has gone away before getting vaccinated. You can get Shingrix whether or not you remember having had chickenpox in the past. Studies show that more than 99% of Americans 40 years and older have had chickenpox, even if they don't remember having the disease. Chickenpox and shingles are related because they are caused by the same virus (varicella zoster virus). After a person recovers from chickenpox, the virus stays dormant (inactive) in the body. It can reactivate years later and cause shingles. If you had Zostavax in the recent past, you should wait at least eight weeks before getting Shingrix. Talk to your healthcare provider to determine the best time to get Shingrix. Shingrix is available in [employer] [facility] and pharmacies. To find doctor's offices or pharmacies near you that offer the vaccine, visit [employer] Vaccine FinderExternal. If you have questions about Shingrix, talk with your healthcare provider. Vaccine for Those 93 Years and Older  Shingrix reduces the risk of shingles and PHN by more than 90% in people 68 and older. CDC recommends the  vaccine for healthy adults 88 and older.  Who Should Not Get Shingrix? You should not get Shingrix if you: . have ever had a severe allergic reaction to any component of the vaccine or after a dose of Shingrix  . tested negative for immunity to varicella zoster virus. If you test negative,  you should get chickenpox vaccine.  . currently have shingles  . currently are pregnant or breastfeeding. Women who are pregnant or breastfeeding should wait to get Shingrix.  Marland Kitchen receive specific antiviral drugs (acyclovir, famciclovir, or valacyclovir) 24 hours before vaccination (avoid use of these antiviral drugs for 14 days after vaccination)- zoster vaccine live only If you have a minor acute (starts suddenly) illness, such as a cold, you may get Shingrix. But if you have a moderate or severe acute illness, you should usually wait until you recover before getting the vaccine. This includes anyone with a temperature of 101.30F or higher. The side effects of the Shingrix are temporary, and usually last 2 to 3 days. While you may experience pain for a few days after getting Shingrix, the pain will be less severe than having shingles and the complications from the disease. How Well Does Shingrix Work? Two doses of Shingrix provides strong protection against shingles and postherpetic neuralgia (PHN), the most common complication of shingles. . In adults 76 to 78 years old who got two doses, Shingrix was 97% effective in preventing shingles; among adults 70 years and older, Shingrix was 91% effective.  . In adults 69 to 78 years old who got two doses, Shingrix was 91% effective in preventing PHN; among adults 70 years and older, Shingrix was 89% effective. Shingrix protection remained high (more than 85%) in people 70 years and older throughout the four years following vaccination. Since your risk of shingles and PHN increases as you get older, it is important to have strong protection against shingles in your older years. Top of Page  What Are the Possible Side Effects of Shingrix? Studies show that Shingrix is safe. The vaccine helps your body create a strong defense against shingles. As a result, you are likely to have temporary side effects from getting the shots. The side effects may affect your  ability to do normal daily activities for 2 to 3 days. Most people got a sore arm with mild or moderate pain after getting Shingrix, and some also had redness and swelling where they got the shot. Some people felt tired, had muscle pain, a headache, shivering, fever, stomach pain, or nausea. About 1 out of 6 people who got Shingrix experienced side effects that prevented them from doing regular activities. Symptoms went away on their own in about 2 to 3 days. Side effects were more common in younger people. You might have a reaction to the first or second dose of Shingrix, or both doses. If you experience side effects, you may choose to take over-the-counter pain medicine such as ibuprofen or acetaminophen. If you experience side effects from Shingrix, you should report them to the Vaccine Adverse Event Reporting System (VAERS). Your doctor might file this report, or you can do it yourself through the VAERS websiteExternal, or by calling [phone]. If you have any questions about side effects from Shingrix, talk with your doctor. The shingles vaccine does not contain thimerosal (a preservative containing mercury). Top of Page  When Should I See a Doctor Because of the Side Effects I Experience From Shingrix? In clinical trials, Shingrix was not associated with serious adverse events. In  fact, serious side effects from vaccines are extremely rare. For example, for every 1 million doses of a vaccine given, only one or two people may have a severe allergic reaction. Signs of an allergic reaction happen within minutes or hours after vaccination and include hives, swelling of the face and throat, difficulty breathing, a fast heartbeat, dizziness, or weakness. If you experience these or any other life-threatening symptoms, see a doctor right away. Shingrix causes a strong response in your immune system, so it may produce short-term side effects more intense than you are used to from other vaccines. These  side effects can be uncomfortable, but they are expected and usually go away on their own in 2 or 3 days. Top of Page  How Can I Pay For Shingrix? There are several ways shingles vaccine may be paid for: Medicare . Medicare Part D plans cover the shingles vaccine, but there may be a cost to you depending on your plan. There may be a copay for the vaccine, or you may need to pay in full then get reimbursed for a certain amount.  . Medicare Part B does not cover the shingles vaccine. Medicaid . Medicaid may or may not cover the vaccine. Contact your insurer to find out. Private health insurance . Many private health insurance plans will cover the vaccine, but there may be a cost to you depending on your plan. Contact your insurer to find out. Vaccine assistance programs . Some pharmaceutical companies provide vaccines to eligible adults who cannot afford them. You may want to check with the vaccine manufacturer, [employer], about Shingrix. If you do not currently have health insurance, learn more about affordable health coverage optionsExternal. To find doctor's offices or pharmacies near you that offer the vaccine, visit HealthMap Vaccine FinderExternal.

## 2020-08-15 LAB — LIPID PANEL
Cholesterol: 145 mg/dL (ref <200)
HDL: 65 mg/dL (ref >=50)
LDL Cholesterol (Calc): 64 mg/dL (calc)
Non-HDL Cholesterol (Calc): 80 mg/dL (calc) (ref <130)
Total CHOL/HDL Ratio: 2.2 (calc) (ref <5.0)
Triglycerides: 81 mg/dL (ref <150)

## 2020-08-15 LAB — CBC WITH DIFFERENTIAL/PLATELET
Absolute Monocytes: 705 cells/uL (ref 200–950)
Basophils Absolute: 53 cells/uL (ref 0–200)
Basophils Relative: 0.7 %
Eosinophils Absolute: 210 cells/uL (ref 15–500)
Eosinophils Relative: 2.8 %
HCT: 37.7 % (ref 35.0–45.0)
Hemoglobin: 12.3 g/dL (ref 11.7–15.5)
Lymphs Abs: 2385 cells/uL (ref 850–3900)
MCH: 30.2 pg (ref 27.0–33.0)
MCHC: 32.6 g/dL (ref 32.0–36.0)
MCV: 92.6 fL (ref 80.0–100.0)
MPV: 12.5 fL (ref 7.5–12.5)
Monocytes Relative: 9.4 %
Neutro Abs: 4148 cells/uL (ref 1500–7800)
Neutrophils Relative %: 55.3 %
Platelets: 271 10*3/uL (ref 140–400)
RBC: 4.07 10*6/uL (ref 3.80–5.10)
RDW: 12.8 % (ref 11.0–15.0)
Total Lymphocyte: 31.8 %
WBC: 7.5 10*3/uL (ref 3.8–10.8)

## 2020-08-15 LAB — COMPLETE METABOLIC PANEL WITH GFR
AG Ratio: 1.4 (calc) (ref 1.0–2.5)
ALT: 53 U/L — ABNORMAL HIGH (ref 6–29)
AST: 46 U/L — ABNORMAL HIGH (ref 10–35)
Albumin: 3.8 g/dL (ref 3.6–5.1)
Alkaline phosphatase (APISO): 99 U/L (ref 37–153)
BUN/Creatinine Ratio: 37 (calc) — ABNORMAL HIGH (ref 6–22)
BUN: 30 mg/dL — ABNORMAL HIGH (ref 7–25)
CO2: 28 mmol/L (ref 20–32)
Calcium: 9 mg/dL (ref 8.6–10.4)
Chloride: 102 mmol/L (ref 98–110)
Creat: 0.82 mg/dL (ref 0.60–0.93)
GFR, Est African American: 79 mL/min/{1.73_m2} (ref >=60)
GFR, Est Non African American: 69 mL/min/{1.73_m2} (ref >=60)
Globulin: 2.7 g/dL (calc) (ref 1.9–3.7)
Glucose, Bld: 84 mg/dL (ref 65–99)
Potassium: 4.2 mmol/L (ref 3.5–5.3)
Sodium: 139 mmol/L (ref 135–146)
Total Bilirubin: 0.4 mg/dL (ref 0.2–1.2)
Total Protein: 6.5 g/dL (ref 6.1–8.1)

## 2020-09-04 ENCOUNTER — Other Ambulatory Visit: Payer: Self-pay | Admitting: Adult Health Nurse Practitioner

## 2020-09-04 DIAGNOSIS — E782 Mixed hyperlipidemia: Secondary | ICD-10-CM

## 2020-09-04 MED ORDER — MONTELUKAST SODIUM 10 MG PO TABS: ORAL_TABLET | ORAL | 3 refills | Status: DC

## 2020-09-04 MED ORDER — ROSUVASTATIN CALCIUM 40 MG PO TABS: ORAL_TABLET | ORAL | 3 refills | Status: DC

## 2020-09-13 ENCOUNTER — Other Ambulatory Visit: Payer: Self-pay | Admitting: Internal Medicine

## 2020-09-13 DIAGNOSIS — Z1231 Encounter for screening mammogram for malignant neoplasm of breast: Secondary | ICD-10-CM

## 2020-10-29 ENCOUNTER — Encounter: Payer: Self-pay | Admitting: Internal Medicine

## 2020-10-29 NOTE — Progress Notes (Signed)
Annual Screening/Preventative Visit & Comprehensive Evaluation &  Examination      This very nice 80 y.o.  [name] presents for a Screening /Preventative Visit & comprehensive evaluation and management of multiple medical co-morbidities.  Patient has been followed for HTN, HLD, T2_NIDDM  Prediabetes  and Vitamin D Deficiency. 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        HTN predates since 2008.  Patient's BP has been controlled at home and patient denies any cardiac symptoms as chest pain, palpitations, shortness of breath, dizziness or ankle swelling. Today's BP is at goal - 136/66       Patient's hyperlipidemia is controlled with diet /Rosuvastatin. Patient denies myalgias or other medication SE's. Last lipids were at goal:  Lab Results  Component Value Date   CHOL 145 08/14/2020   HDL 65 08/14/2020   LDLCALC 64 08/14/2020   TRIG 81 08/14/2020   CHOLHDL 2.2 08/14/2020       Patient has hx/o prediabetes predating (A1c6.0% /2011) and patient denies reactive hypoglycemic symptoms, visual blurring, diabetic polys or paresthesias. Last A1c was normal & at goal:  Lab Results  Component Value Date   HGBA1C 5.4 05/10/2020       Finally, patient has history of Vitamin D Deficiency and last Vitamin D was at goal:  Lab Results  Component Value Date   VD25OH 69 05/10/2020    Current Outpatient Medications on File Prior to Visit  Medication Sig  . ALPRAZolam (XANAX) 0.5 MG tablet TAKE 1/2 TO 1 (ONE-HALF TO ONE) TABLET BY MOUTH THREE TIMES DAILY AS NEEDED FOR ANXIETY  . Ascorbic Acid (VITAMIN C) 1000 MG tablet Take 1,000 mg by mouth daily.  Marland Kitchen aspirin EC 81 MG  Take  at bedtime.  Marland Kitchen VITAMIN D 2000 units  Take 1 capsule  daily.  . furosemide ) 40 MG  Take 1 tablet  as needed for edema.  Marland Kitchen losartan-hctz 50-12.5 MG tablet Take1 tabletDaily for BP  . montelukast  10 MG  Take 1 tablet Daily for Allergies  . Naproxen 220 MG C Take  as needed.   . Omega-3 FISH  OIL 1000 MG  Take  2  times daily.  . pregabalin 150 MG capsule Take 1 capsule 4 x /day for Chronic Pain  . VITAMIN B-6 100 MG Take 2  times a day.   . rosuvastatin 40 MG  Take 1/2 to 1 tablet Daily for Cholesterol  . TIMOPTIC) 0.5 % ophth soln Place 1 drop into both eyes every morning.   . triamcinolone ointment (KENALOG) 0.1 % Apply  2  times daily.  Marland Kitchen zinc  50 MG tablet Take  daily.    Allergies  Allergen Reactions  . Oxycodone-Acetaminophen panic attack      . Vit C-Cholecalciferol-Rose Hip [Cholecalciferol-Vitamin C] dyspepsia      . Ibuprofen Hives  . Macrodantin  Rash    Past Medical History:  Diagnosis Date  . Allergy   . Anemia   . Arthritis   . Blood transfusion without reported diagnosis   . Cataract    removed both eyes   . Cervical radicular pain 02/11/2016  . Glaucoma    low pressure glaucoma   . Hyperlipidemia   . Hypertension   . Osteopenia   . Pelvis fracture [facility]) March 26, 2002   both sides fx  . Positive colorectal cancer screening using Cologuard test (False Positive) 04/14/2017  . Pre-diabetes   .  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 Vitamin D deficiency     Health Maintenance  Topic Date Due  . Hepatitis C Screening  Never done  . COVID-19 Vaccine (3 - Booster for Moderna series) 04/09/2020  . MAMMOGRAM  10/29/2020  . TETANUS/TDAP  01/27/2022  . INFLUENZA VACCINE  Completed  . DEXA SCAN  Completed  . PNA vac Low Risk Adult  Completed  . HPV VACCINES  Aged Out    Immunization History  Administered Date(s) Administered  . Influenza, High Dose Seasonal PF 06/06/2014, 05/14/2016, 06/24/2018, 06/05/2019, 06/13/2020  . Influenza-Unspecified 06/13/2013, 05/02/2015, 06/24/2018  . Moderna Sars-Covid-2 Vaccination 09/13/2019, 10/11/2019  . Pneumococcal Conjugate-13 05/24/2014  . Pneumococcal Polysaccharide-23 01/08/2009  . Tdap 01/28/2012  . Zoster 08/31/2006    Cologard  - 04/14/2017 - Positive    Last Colon - 06/15/2017 - Dr  Havery Moros - recc no f/u due to age  MGM -  Scheduled - 11/04/2020 -   BMD - scheduled 01/21/2021    Past Surgical History:  Procedure Laterality Date  . COLONOSCOPY    . DILATION AND CURETTAGE OF UTERUS     age 67   . right elbow   surgery  March 26, 2002  . right hand ulner nerve surgery  March 26, 2002   I and d done right hand/wrist  . right shoulder humerus fx  july 2003   surgery done  . right total hip arthroplasty  2008  . right total knee arthroplasty  sept 2012  . right wrist plating  2004  . TONSILLECTOMY  age 13  . TOTAL HIP ARTHROPLASTY  10/22/2011   Procedure: TOTAL HIP ARTHROPLASTY ANTERIOR APPROACH;  Surgeon: Mauri Pole, MD;  Location: [facility];  Service: Orthopedics;  Laterality: Left;  Left Total Hip Arthroplasty,  Anterior Approach     Family History  Problem Relation Age of Onset  . Hypertension Mother   . Diabetes Mother   . Heart disease Mother        enlarged heart  . Atrial fibrillation Mother   . Diabetes Father   . Heart disease Father   . Heart attack Father 58  . Breast cancer Maternal Aunt        early 85's  . Breast cancer Maternal Grandmother        early 66's  . Breast cancer Paternal Aunt        41's  . Atrial fibrillation Brother   . Atrial fibrillation Brother   . Colon cancer Neg Hx   . Esophageal cancer Neg Hx   . Rectal cancer Neg Hx   . Stomach cancer Neg Hx   . Pancreatic cancer Neg Hx     Social History   Tobacco Use  . Smoking status: Never Smoker  . Smokeless tobacco: Never Used  Vaping Use  . Vaping Use: Never used  Substance Use Topics  . Alcohol use: Yes    Comment: social  . Drug use: No    ROS Constitutional: Denies fever, chills, weight loss/gain, headaches, insomnia,  night sweats, and change in appetite. Does c/o fatigue. Eyes: Denies redness, blurred vision, diplopia, discharge, itchy, watery eyes.  ENT: Denies discharge, congestion, post nasal drip, epistaxis, sore throat, earache, hearing loss, dental  pain, Tinnitus, Vertigo, Sinus pain, snoring.  Cardio: Denies chest pain, palpitations, irregular heartbeat, syncope, dyspnea, diaphoresis, orthopnea, PND, claudication, edema Respiratory: denies cough, dyspnea, DOE, pleurisy, hoarseness, laryngitis, wheezing.  Gastrointestinal: Denies dysphagia, heartburn, reflux,  water brash, pain, cramps, nausea, vomiting, bloating, diarrhea, constipation, hematemesis, melena, hematochezia, jaundice, hemorrhoids Genitourinary: Denies dysuria, frequency, urgency, nocturia, hesitancy, discharge, hematuria, flank pain Breast: Breast lumps, nipple discharge, bleeding.  Musculoskeletal: Denies arthralgia, myalgia, stiffness, Jt. Swelling, pain, limp, and strain/sprain. Denies falls. Skin: Denies puritis, rash, hives, warts, acne, eczema, changing in skin lesion Neuro: No weakness, tremor, incoordination, spasms, paresthesia, pain Psychiatric: Denies confusion, memory loss, sensory loss. Denies Depression. Endocrine: Denies change in weight, skin, hair change, nocturia, and paresthesia, diabetic polys, visual blurring, hyper / hypo glycemic episodes.  Heme/Lymph: No excessive bleeding, bruising, enlarged lymph nodes.  Physical Exam  BP 136/66   Pulse 60   Temp (!) 97.3 F (36.3 C)   Resp 16   Ht 5' 2.25" (1.581 m)   Wt 160 lb 12.8 oz (72.9 kg)   SpO2 98%   BMI 29.17 kg/m   General Appearance: Well nourished, well groomed and in no apparent distress.  Eyes: PERRLA, EOMs, conjunctiva no swelling or erythema, normal fundi and vessels. Sinuses: No frontal/maxillary tenderness ENT/Mouth: EACs patent / TMs  nl. Nares clear without erythema, swelling, mucoid exudates. Oral hygiene is good. No erythema, swelling, or exudate. Tongue normal, non-obstructing. Tonsils not swollen or erythematous. Hearing normal.  Neck: Supple, thyroid not palpable. No bruits, nodes or JVD. Respiratory: Respiratory effort normal.  BS equal and clear bilateral without rales, rhonci,  wheezing or stridor. Cardio: Heart sounds are normal with regular rate and rhythm and no murmurs, rubs or gallops. Peripheral pulses are normal and equal bilaterally without edema. No aortic or femoral bruits. Chest: symmetric with normal excursions and percussion. Breasts: Symmetric, without lumps, nipple discharge, retractions, or fibrocystic changes.  Abdomen: Flat, soft with bowel sounds active. Nontender, no guarding, rebound, hernias, masses, or organomegaly.  Lymphatics: Non tender without lymphadenopathy.  Genitourinary:  Musculoskeletal: Full ROM all peripheral extremities, joint stability, 5/5 strength, and normal gait. Skin: Warm and dry without rashes, lesions, cyanosis, clubbing or  ecchymosis.  Neuro: Cranial nerves intact, reflexes equal bilaterally. Normal muscle tone, no cerebellar symptoms. Sensation intact.  Pysch: Alert and oriented X 3, normal affect, Insight and Judgment appropriate.   Assessment and Plan  1. Annual Preventative Screening Examination  2. Essential hypertension  - EKG 12-Lead - Urinalysis, Routine w reflex microscopic - Microalbumin / creatinine urine ratio - CBC with Differential/Platelet - COMPLETE METABOLIC PANEL WITH GFR - Magnesium  3. Hyperlipidemia, mixed  - EKG 12-Lead - TSH  4. Abnormal glucose  - EKG 12-Lead - Fructosamine - Insulin, random  5. Vitamin D deficiency  - VITAMIN D 25 Hydroxy  6. 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 Screening for colorectal cancer  - POC Hemoccult Bld/Stl  8. Screening for ischemic heart disease  - EKG 12-Lead  9. FHx: heart disease  - EKG 12-Lead  10. Medication management  - Urinalysis, Routine w reflex microscopic - Microalbumin / creatinine urine ratio - CBC with Differential/Platelet - COMPLETE METABOLIC PANEL WITH GFR - Magnesium - TSH - Fructosamine - Insulin, random - VITAMIN D 25 Hydroxy          Patient was counseled in prudent diet to  achieve/maintain BMI less than 25 for weight control, BP monitoring, regular exercise and medications. Discussed med's effects and SE's. Screening labs and tests as requested with regular follow-up as recommended. Over 40 minutes of exam, counseling, chart review and high complex critical decision making was performed.   Kirtland Bouchard, MD

## 2020-10-29 NOTE — Patient Instructions (Signed)

## 2020-10-30 ENCOUNTER — Ambulatory Visit (INDEPENDENT_AMBULATORY_CARE_PROVIDER_SITE_OTHER): Payer: PPO | Admitting: Internal Medicine

## 2020-10-30 ENCOUNTER — Other Ambulatory Visit: Payer: Self-pay

## 2020-10-30 VITALS — BP 136/66 | HR 60 | Temp 97.3°F | Resp 16 | Ht 62.25 in | Wt 160.8 lb

## 2020-10-30 DIAGNOSIS — Z8249 Family history of ischemic heart disease and other diseases of the circulatory system: Secondary | ICD-10-CM | POA: Diagnosis not present

## 2020-10-30 DIAGNOSIS — R7309 Other abnormal glucose: Secondary | ICD-10-CM

## 2020-10-30 DIAGNOSIS — E782 Mixed hyperlipidemia: Secondary | ICD-10-CM | POA: Diagnosis not present

## 2020-10-30 DIAGNOSIS — Z1212 Encounter for screening for malignant neoplasm of rectum: Secondary | ICD-10-CM

## 2020-10-30 DIAGNOSIS — Z0000 Encounter for general adult medical examination without abnormal findings: Secondary | ICD-10-CM | POA: Diagnosis not present

## 2020-10-30 DIAGNOSIS — I10 Essential (primary) hypertension: Secondary | ICD-10-CM

## 2020-10-30 DIAGNOSIS — Z136 Encounter for screening for cardiovascular disorders: Secondary | ICD-10-CM | POA: Diagnosis not present

## 2020-10-30 DIAGNOSIS — Z1211 Encounter for screening for malignant neoplasm of colon: Secondary | ICD-10-CM

## 2020-10-30 DIAGNOSIS — Z79899 Other long term (current) drug therapy: Secondary | ICD-10-CM | POA: Diagnosis not present

## 2020-10-30 DIAGNOSIS — G90519 Complex regional pain syndrome I of unspecified upper limb: Secondary | ICD-10-CM

## 2020-10-30 DIAGNOSIS — Z0001 Encounter for general adult medical examination with abnormal findings: Secondary | ICD-10-CM

## 2020-10-30 DIAGNOSIS — E559 Vitamin D deficiency, unspecified: Secondary | ICD-10-CM | POA: Diagnosis not present

## 2020-10-31 NOTE — Progress Notes (Signed)
============================================================ -   Test results slightly outside the reference range are not unusual. If there is anything important, I will review this with you,  otherwise it is considered normal test values.  If you have further questions,  please do not hesitate to contact me at the office or via My Chart.  ============================================================ ============================================================  -  Vitamin D = 69 - Excellent   ============================================================ ============================================================  -  All Else - CBC - Kidneys - U/A - Electrolytes - Liver - Magnesium & Thyroid    - all  Normal / OK  ( still waiting on the  "Fructosamine" which is like an A1c to measure                                      6 week average Glucose, but I expect it to be Normal )   ============================================================ ============================================================  -  Keep up the [place] Work  !  ============================================================ ============================================================

## 2020-11-01 LAB — COMPLETE METABOLIC PANEL WITH GFR
AG Ratio: 1.5 (calc) (ref 1.0–2.5)
ALT: 42 U/L — ABNORMAL HIGH (ref 6–29)
AST: 37 U/L — ABNORMAL HIGH (ref 10–35)
Albumin: 4.1 g/dL (ref 3.6–5.1)
Alkaline phosphatase (APISO): 94 U/L (ref 37–153)
BUN/Creatinine Ratio: 33 (calc) — ABNORMAL HIGH (ref 6–22)
BUN: 26 mg/dL — ABNORMAL HIGH (ref 7–25)
CO2: 31 mmol/L (ref 20–32)
Calcium: 9.4 mg/dL (ref 8.6–10.4)
Chloride: 102 mmol/L (ref 98–110)
Creat: 0.78 mg/dL (ref 0.60–0.93)
GFR, Est African American: 84 mL/min/{1.73_m2} (ref >=60)
GFR, Est Non African American: 73 mL/min/{1.73_m2} (ref >=60)
Globulin: 2.7 g/dL (calc) (ref 1.9–3.7)
Glucose, Bld: 90 mg/dL (ref 65–99)
Potassium: 4.2 mmol/L (ref 3.5–5.3)
Sodium: 141 mmol/L (ref 135–146)
Total Bilirubin: 0.5 mg/dL (ref 0.2–1.2)
Total Protein: 6.8 g/dL (ref 6.1–8.1)

## 2020-11-01 LAB — CBC WITH DIFFERENTIAL/PLATELET
Absolute Monocytes: 722 cells/uL (ref 200–950)
Basophils Absolute: 38 cells/uL (ref 0–200)
Basophils Relative: 0.5 %
Eosinophils Absolute: 198 cells/uL (ref 15–500)
Eosinophils Relative: 2.6 %
HCT: 38.3 % (ref 35.0–45.0)
Hemoglobin: 12.7 g/dL (ref 11.7–15.5)
Lymphs Abs: 2318 cells/uL (ref 850–3900)
MCH: 30 pg (ref 27.0–33.0)
MCHC: 33.2 g/dL (ref 32.0–36.0)
MCV: 90.3 fL (ref 80.0–100.0)
MPV: 12.2 fL (ref 7.5–12.5)
Monocytes Relative: 9.5 %
Neutro Abs: 4324 cells/uL (ref 1500–7800)
Neutrophils Relative %: 56.9 %
Platelets: 273 10*3/uL (ref 140–400)
RBC: 4.24 10*6/uL (ref 3.80–5.10)
RDW: 13.1 % (ref 11.0–15.0)
Total Lymphocyte: 30.5 %
WBC: 7.6 10*3/uL (ref 3.8–10.8)

## 2020-11-01 LAB — URINALYSIS, ROUTINE W REFLEX MICROSCOPIC
Bilirubin Urine: NEGATIVE
Glucose, UA: NEGATIVE
Hgb urine dipstick: NEGATIVE
Ketones, ur: NEGATIVE
Nitrite: NEGATIVE
Protein, ur: NEGATIVE
Specific Gravity, Urine: 1.018 (ref 1.001–1.03)
pH: 6.5 (ref 5.0–8.0)

## 2020-11-01 LAB — MICROALBUMIN / CREATININE URINE RATIO
Creatinine, Urine: 88 mg/dL (ref 20–275)
Microalb Creat Ratio: 5 mcg/mg creat (ref <30)
Microalb, Ur: 0.4 mg/dL

## 2020-11-01 LAB — FRUCTOSAMINE: Fructosamine: 252 umol/L (ref 205–285)

## 2020-11-01 LAB — VITAMIN D 25 HYDROXY (VIT D DEFICIENCY, FRACTURES): Vit D, 25-Hydroxy: 69 ng/mL (ref 30–100)

## 2020-11-01 LAB — MAGNESIUM: Magnesium: 2 mg/dL (ref 1.5–2.5)

## 2020-11-01 LAB — TSH: TSH: 1.08 mIU/L (ref 0.40–4.50)

## 2020-11-01 LAB — INSULIN, RANDOM: Insulin: 7.3 u[IU]/mL

## 2020-11-01 LAB — MICROSCOPIC MESSAGE

## 2020-11-04 ENCOUNTER — Ambulatory Visit
Admission: RE | Admit: 2020-11-04 | Discharge: 2020-11-04 | Disposition: A | Discharge status: Home / Self Care | Payer: PPO | Source: Ambulatory Visit | Attending: Internal Medicine | Admitting: Internal Medicine

## 2020-11-04 ENCOUNTER — Other Ambulatory Visit: Payer: Self-pay

## 2020-11-04 DIAGNOSIS — Z1231 Encounter for screening mammogram for malignant neoplasm of breast: Secondary | ICD-10-CM

## 2020-11-25 ENCOUNTER — Other Ambulatory Visit: Payer: Self-pay | Admitting: Internal Medicine

## 2020-11-25 DIAGNOSIS — I10 Essential (primary) hypertension: Secondary | ICD-10-CM

## 2020-12-16 DIAGNOSIS — H401232 Low-tension glaucoma, bilateral, moderate stage: Secondary | ICD-10-CM | POA: Diagnosis not present

## 2020-12-16 DIAGNOSIS — H04123 Dry eye syndrome of bilateral lacrimal glands: Secondary | ICD-10-CM | POA: Diagnosis not present

## 2020-12-16 DIAGNOSIS — H16103 Unspecified superficial keratitis, bilateral: Secondary | ICD-10-CM | POA: Diagnosis not present

## 2021-01-21 ENCOUNTER — Ambulatory Visit
Admission: RE | Admit: 2021-01-21 | Discharge: 2021-01-21 | Disposition: A | Discharge status: Home / Self Care | Payer: PPO | Source: Ambulatory Visit | Attending: Adult Health Nurse Practitioner | Admitting: Adult Health Nurse Practitioner

## 2021-01-21 ENCOUNTER — Other Ambulatory Visit: Payer: Self-pay

## 2021-01-21 DIAGNOSIS — Z780 Asymptomatic menopausal state: Secondary | ICD-10-CM | POA: Diagnosis not present

## 2021-01-21 DIAGNOSIS — E2839 Other primary ovarian failure: Secondary | ICD-10-CM

## 2021-01-28 DIAGNOSIS — H401232 Low-tension glaucoma, bilateral, moderate stage: Secondary | ICD-10-CM | POA: Diagnosis not present

## 2021-02-04 ENCOUNTER — Ambulatory Visit: Payer: PPO | Admitting: Adult Health Nurse Practitioner

## 2021-05-13 DIAGNOSIS — H401232 Low-tension glaucoma, bilateral, moderate stage: Secondary | ICD-10-CM | POA: Diagnosis not present

## 2021-05-23 NOTE — Progress Notes (Signed)
MEDICARE ANNUAL WELLNESS VISIT AND FOLLOW UP  Assessment:   [name] was seen today for follow-up and medicare wellness.  Diagnoses and all orders for this visit:  Encounter for Medicare annual wellness exam Due annually  Health maintenance reviewed  Essential hypertension Continue current medications Has Lasix 40mg , takes once a week or every other weeks. Monitor blood pressure at home; call if consistently over 130/80 Continue DASH diet.   Reminder to go to the ER if any CP, SOB, nausea, dizziness, severe HA, changes vision/speech, left arm numbness and tingling and jaw pain. -     CBC with Differential/Platelet -     COMPLETE METABOLIC PANEL WITH GFR  Hyperlipidemia, mixed Continue medications: rosuvastatin 20mg  three times a week Discussed dietary and exercise modifications Low fat diet -     Lipid panel  Abnormal glucose Discussed dietary and exercise modifications  Overweight (BMI 25.0-29.9) Discussed dietary and exercise modifications  Generalized anxiety disorder Doing well on current regimen, rare xanax use, monitor PDMP with refill requests Discussed stress management techniques   Discussed good sleep hygiene Discussed increasing physical activity and exercise Increase water intake  Open-angle glaucoma, unspecified glaucoma stage, unspecified laterality, unspecified open-angle glaucoma type Continue drops as prescribed Yearly eye exams Doing well at this time  Cervical arthritis/radicular pain Doing well at this time Lyrica helping with this Follow with with as needed  Vitamin D deficiency Continue supplementation to maintain goal of 60-100 Check levels  Medication management Continued  Numbness in toes Mild, exam with some toe callus- care discussed Will check TSH, B12; denies other sx Monitor; restart exercise; if progressive follow up in office, consider lumbar MRI, NCV, neuro referral    Over 30 minutes of face to face exam, counseling,  chart review, and critical decision making was performed Future Appointments  Date Time Provider [place]  11/06/2021 11:00 AM Unk Pinto, MD [facility] None  05/27/2022  9:30 AM Liane Comber, NP [facility] None    Plan:   During the course of the visit the patient was educated and counseled about appropriate screening and preventive services including:   Pneumococcal vaccine  Influenza vaccine Td vaccine Prevnar 13 Screening electrocardiogram Screening mammography Bone densitometry screening Colorectal cancer screening Diabetes screening Glaucoma screening Nutrition counseling  Advanced directives: given info/requested copies   Subjective:   Gwendolyn [name] is a 80 y.o. female who presents for Medicare Annual Wellness Visit and 3 month follow up on hypertension, prediabetes, hyperlipidemia, vitamin D def.  She has hx of anxiety, has been prescribed xanax though improved.   She reports last use was over one month ago.   Cervical arthritis/radicular pain, managing fairly with lifestyle modification (sleeping on back, flatter pillow), using aleve PRN, lyrica.  She has noted 2 months of constant numb sensation in toes, but denies decreased sensation to touch.   BMI is Body mass index is 29.76 kg/m., she has been working on diet and exercise, generally active.  Wt Readings from Last 3 Encounters:  05/27/21 164 lb (74.4 kg)  10/30/20 160 lb 12.8 oz (72.9 kg)  08/14/20 161 lb (73 kg)   Her blood pressure has been controlled at home, today their BP is BP: 136/66  She does workout, goes to [employer] 3 days a week. She denies chest pain, shortness of breath, dizziness. She takes lasix AS needed for swelling but has severe leg cramps that night in her ankles, takes the kdur 32meq with it but still with pain.    She is  on cholesterol medication and has myalgias with crestor (20 mg three times a week) on the days she takes it. Her cholesterol is at goal.  The cholesterol last visit was:   Lab Results  Component Value Date   CHOL 145 08/14/2020   HDL 65 08/14/2020   LDLCALC 64 08/14/2020   TRIG 81 08/14/2020   CHOLHDL 2.2 08/14/2020    She has been working on diet and exercise for glucose management, and denies paresthesia of the feet, polydipsia, polyuria and visual disturbances. Last A1C in the office was:  Lab Results  Component Value Date   HGBA1C 5.4 05/10/2020   Lab Results  Component Value Date   GFRNONAA 73 10/30/2020   Patient is on Vitamin D supplement.   Lab Results  Component Value Date   VD25OH 69 10/30/2020       Medication Review Current Outpatient Medications on File Prior to Visit  Medication Sig   ALPRAZolam (XANAX) 0.5 MG tablet TAKE 1/2 TO 1 (ONE-HALF TO ONE) TABLET BY MOUTH THREE TIMES DAILY AS NEEDED FOR ANXIETY   Ascorbic Acid (VITAMIN C) 1000 MG tablet Take 1,000 mg by mouth daily.   aspirin EC 81 MG tablet Take 81 mg by mouth at bedtime.   Cholecalciferol (VITAMIN D) 2000 units CAPS Take 1 capsule by mouth daily.   furosemide (LASIX) 40 MG tablet Take 1 tablet (40 mg total) by mouth as needed for edema.   losartan-hydrochlorothiazide (HYZAAR) 50-12.5 MG tablet Take  1 tablet  Daily  for BP   Naproxen Sodium 220 MG CAPS Take by mouth as needed.    Omega-3 Fatty Acids (FISH OIL) 1000 MG CAPS Take by mouth 2 (two) times daily.   pregabalin (LYRICA) 150 MG capsule Take 1 capsule    4 x /day      for Chronic Pain   timolol (TIMOPTIC) 0.5 % ophthalmic solution Place 1 drop into both eyes every morning.    zinc gluconate 50 MG tablet Take 50 mg by mouth daily.   No current facility-administered medications on file prior to visit.    Current Problems (verified) Patient Active Problem List   Diagnosis Date Noted   Cervical arthritis 09/27/2017   Open-angle glaucoma 05/14/2016   Generalized anxiety disorder 02/11/2016   Overweight (BMI 25.0-29.9) 04/25/2015   Medication management 02/12/2014   Essential  hypertension 08/08/2013   Hyperlipidemia, mixed 08/08/2013   Abnormal glucose 08/08/2013   Vitamin D deficiency 08/08/2013    Screening Tests Immunization History  Administered Date(s) Administered   Fluad Quad(high Dose 65+) 05/23/2021   Influenza, High Dose Seasonal PF 06/06/2014, 05/14/2016, 06/24/2018, 06/05/2019, 06/13/2020   Influenza-Unspecified 06/13/2013, 05/02/2015, 06/24/2018   Moderna Sars-Covid-2 Vaccination 09/13/2019, 10/11/2019, 07/01/2020, 01/17/2021   Pneumococcal Conjugate-13 05/24/2014   Pneumococcal Polysaccharide-23 01/08/2009   Tdap 01/28/2012   Zoster, Live 08/31/2006    Preventative care: Last colonoscopy: 2018, benign polyps, follow up PRN only per Dr. Havery Moros Cologuard 2018 (positive)  Last mammogram: 10/2020 Last pap smear/pelvic exam: 2019 Dr. Ulanda Edison, declines another  DEXA: 12/2020 normal, T -0.8  Prior vaccinations: TD or Tdap: 2013  Influenza: 05/20/2021 Pneumococcal: 2010 Prevnar13: 2015 Zostavax: 2008 Shringrix: Discussed with patient  Covid 19: 2/2 + booster 12/2020  Names of Other Physician/Practitioners you currently use: 1. [facility]- here for primary care 2. Dr. Presley Raddle opht, eye doctor, last visit 2022, low pressure glaucoma 3. Dr. Marland Kitchen  dentist, last visit 2022, q11m  Patient Care Team: Unk Pinto, MD as PCP -  General (Internal Medicine) Lafayette Dragon, MD (Inactive) as Consulting Physician (Gastroenterology) Newton Pigg, MD as Consulting Physician (Obstetrics and Gynecology)   Allergies Allergies  Allergen Reactions   Oxycodone-Acetaminophen Other (See Comments)    panic attack   Vit C-Cholecalciferol-Rose Hip [Cholecalciferol-Vitamin C] Other (See Comments)    dyspepsia   Ibuprofen Hives   Macrodantin [Nitrofurantoin Macrocrystal] Rash    SURGICAL HISTORY She  has a past surgical history that includes right shoulder humerus fx (july 2003); right hand ulner nerve  surgery (March 26, 2002); right elbow   surgery (March 26, 2002); right wrist plating (2004); right total hip arthroplasty (2008); right total knee arthroplasty (sept 2012); Tonsillectomy (age 9); Total hip arthroplasty (10/22/2011); Colonoscopy; and Dilation and curettage of uterus. FAMILY HISTORY Her family history includes Atrial fibrillation in her brother, brother, and mother; Breast cancer in her maternal aunt, maternal grandmother, and paternal aunt; Diabetes in her father and mother; Heart attack (age of onset: 36) in her father; Heart disease in her father and mother; Hypertension in her mother. SOCIAL HISTORY She  reports that she has never smoked. She has never used smokeless tobacco. She reports current alcohol use. She reports that she does not use drugs.  MEDICARE WELLNESS OBJECTIVES: Physical activity: Current Exercise Habits: The patient does not participate in regular exercise at present, Exercise limited by: None identified Cardiac risk factors: Cardiac Risk Factors include: advanced age (>69men, >70 women);dyslipidemia;hypertension Depression/mood screen:   Depression screen [facility] 2/9 10/29/2020  Decreased Interest 0  Down, Depressed, Hopeless 0  PHQ - 2 Score 0    ADLs:  In your present state of health, do you have any difficulty performing the following activities: 05/27/2021 10/29/2020  Hearing? N N  Vision? N N  Difficulty concentrating or making decisions? N N  Walking or climbing stairs? N N  Dressing or bathing? N N  Doing errands, shopping? N N  Some recent data might be hidden    Cognitive Testing  Alert? Yes  Normal Appearance?Yes  Oriented to person? Yes  Place? Yes   Time? Yes  Recall of three objects?  Yes  Can perform simple calculations? Yes  Displays appropriate judgment?Yes  Can read the correct time from a watch face?Yes  EOL planning: Does Patient Have a Medical Advance Directive?: Yes Type of Advance Directive: Healthcare Power of Attorney, Living  will Does patient want to make changes to medical advance directive?: No - Patient declined Copy of [place] in Chart?: No - copy requested   Objective:   Today's Vitals   05/27/21 0923  BP: 136/66  Pulse: 63  Temp: 97.7 F (36.5 C)  SpO2: 97%  Weight: 164 lb (74.4 kg)    Body mass index is 29.76 kg/m.  General Appearance: Well nourished, in no apparent distress. Eyes: PERRLA, EOMs, conjunctiva no swelling or erythema Sinuses: No Frontal/maxillary tenderness ENT/Mouth: Ext aud canals clear, TMs without erythema, bulging. No erythema, swelling, or exudate on post pharynx.  Tonsils not swollen or erythematous. Hearing normal.  Neck: Supple, thyroid normal.  Respiratory: Respiratory effort normal, BS equal bilaterally without rales, rhonchi, wheezing or stridor.  Cardio: RRR with no MRGs. Brisk peripheral pulses without edema.  Abdomen: Soft, + BS.  Non tender, no guarding, rebound, hernias, masses. Lymphatics: Non tender without lymphadenopathy.  Musculoskeletal: Full ROM, 5/5 strength, mild valgus of left knee, mildly antalgic gait. No effusion, laxity.  Skin: Warm, dry without rashes, lesions, ecchymosis. Left toenails thickened (reports did lamisil, declines further).  Some callus of toes.  Neuro: Cranial nerves intact. No cerebellar symptoms. Mildly decreased sensation to monofilament toe tips, otherwise intact.  Psych: Awake and oriented X 3, normal affect, Insight and Judgment appropriate.    Medicare Attestation I have personally reviewed: The patient's medical and social history Their use of alcohol, tobacco or illicit drugs Their current medications and supplements The patient's functional ability including ADLs,fall risks, home safety risks, cognitive, and hearing and visual impairment Diet and physical activities Evidence for depression or mood disorders  The patient's weight, height, BMI, and visual acuity have been recorded in the chart.  I  have made referrals, counseling, and provided education to the patient based on review of the above and I have provided the patient with a written personalized care plan for preventive services.     Izora Ribas, NP   05/27/2021

## 2021-05-26 ENCOUNTER — Ambulatory Visit: Payer: PPO | Admitting: Adult Health

## 2021-05-27 ENCOUNTER — Encounter: Payer: Self-pay | Admitting: Adult Health

## 2021-05-27 ENCOUNTER — Ambulatory Visit (INDEPENDENT_AMBULATORY_CARE_PROVIDER_SITE_OTHER): Payer: PPO | Admitting: Adult Health

## 2021-05-27 ENCOUNTER — Other Ambulatory Visit: Payer: Self-pay

## 2021-05-27 VITALS — BP 136/66 | HR 63 | Temp 97.7°F | Wt 164.0 lb

## 2021-05-27 DIAGNOSIS — R6889 Other general symptoms and signs: Secondary | ICD-10-CM

## 2021-05-27 DIAGNOSIS — Z0000 Encounter for general adult medical examination without abnormal findings: Secondary | ICD-10-CM

## 2021-05-27 DIAGNOSIS — I10 Essential (primary) hypertension: Secondary | ICD-10-CM | POA: Diagnosis not present

## 2021-05-27 DIAGNOSIS — E559 Vitamin D deficiency, unspecified: Secondary | ICD-10-CM | POA: Diagnosis not present

## 2021-05-27 DIAGNOSIS — F411 Generalized anxiety disorder: Secondary | ICD-10-CM

## 2021-05-27 DIAGNOSIS — E782 Mixed hyperlipidemia: Secondary | ICD-10-CM

## 2021-05-27 DIAGNOSIS — Z0001 Encounter for general adult medical examination with abnormal findings: Secondary | ICD-10-CM

## 2021-05-27 DIAGNOSIS — E663 Overweight: Secondary | ICD-10-CM | POA: Diagnosis not present

## 2021-05-27 DIAGNOSIS — Z79899 Other long term (current) drug therapy: Secondary | ICD-10-CM

## 2021-05-27 DIAGNOSIS — R200 Anesthesia of skin: Secondary | ICD-10-CM | POA: Diagnosis not present

## 2021-05-27 DIAGNOSIS — M47812 Spondylosis without myelopathy or radiculopathy, cervical region: Secondary | ICD-10-CM

## 2021-05-27 DIAGNOSIS — R7309 Other abnormal glucose: Secondary | ICD-10-CM | POA: Diagnosis not present

## 2021-05-27 DIAGNOSIS — H4010X0 Unspecified open-angle glaucoma, stage unspecified: Secondary | ICD-10-CM

## 2021-05-27 MED ORDER — ROSUVASTATIN CALCIUM 20 MG PO TABS: ORAL_TABLET | ORAL | 3 refills | Status: DC

## 2021-05-27 MED ORDER — TRIAMCINOLONE ACETONIDE 0.1 % EX OINT: 1.0000 "application " | TOPICAL_OINTMENT | Freq: Two times a day (BID) | CUTANEOUS | 1 refills | Status: DC

## 2021-05-28 ENCOUNTER — Encounter: Payer: Self-pay | Admitting: Adult Health

## 2021-05-28 DIAGNOSIS — E538 Deficiency of other specified B group vitamins: Secondary | ICD-10-CM | POA: Insufficient documentation

## 2021-05-28 LAB — LIPID PANEL
Cholesterol: 145 mg/dL (ref <200)
HDL: 62 mg/dL (ref >=50)
LDL Cholesterol (Calc): 67 mg/dL (calc)
Non-HDL Cholesterol (Calc): 83 mg/dL (calc) (ref <130)
Total CHOL/HDL Ratio: 2.3 (calc) (ref <5.0)
Triglycerides: 80 mg/dL (ref <150)

## 2021-05-28 LAB — COMPLETE METABOLIC PANEL WITH GFR
AG Ratio: 1.6 (calc) (ref 1.0–2.5)
ALT: 17 U/L (ref 6–29)
AST: 25 U/L (ref 10–35)
Albumin: 4 g/dL (ref 3.6–5.1)
Alkaline phosphatase (APISO): 77 U/L (ref 37–153)
BUN: 23 mg/dL (ref 7–25)
CO2: 30 mmol/L (ref 20–32)
Calcium: 9.4 mg/dL (ref 8.6–10.4)
Chloride: 105 mmol/L (ref 98–110)
Creat: 0.93 mg/dL (ref 0.60–1.00)
Globulin: 2.5 g/dL (calc) (ref 1.9–3.7)
Glucose, Bld: 85 mg/dL (ref 65–99)
Potassium: 4.3 mmol/L (ref 3.5–5.3)
Sodium: 141 mmol/L (ref 135–146)
Total Bilirubin: 0.5 mg/dL (ref 0.2–1.2)
Total Protein: 6.5 g/dL (ref 6.1–8.1)
eGFR: 63 mL/min/{1.73_m2} (ref >=60)

## 2021-05-28 LAB — CBC WITH DIFFERENTIAL/PLATELET
Absolute Monocytes: 509 cells/uL (ref 200–950)
Basophils Absolute: 48 cells/uL (ref 0–200)
Basophils Relative: 0.9 %
Eosinophils Absolute: 170 cells/uL (ref 15–500)
Eosinophils Relative: 3.2 %
HCT: 39 % (ref 35.0–45.0)
Hemoglobin: 12.7 g/dL (ref 11.7–15.5)
Lymphs Abs: 1765 cells/uL (ref 850–3900)
MCH: 29.7 pg (ref 27.0–33.0)
MCHC: 32.6 g/dL (ref 32.0–36.0)
MCV: 91.3 fL (ref 80.0–100.0)
MPV: 12 fL (ref 7.5–12.5)
Monocytes Relative: 9.6 %
Neutro Abs: 2809 cells/uL (ref 1500–7800)
Neutrophils Relative %: 53 %
Platelets: 238 10*3/uL (ref 140–400)
RBC: 4.27 10*6/uL (ref 3.80–5.10)
RDW: 13.5 % (ref 11.0–15.0)
Total Lymphocyte: 33.3 %
WBC: 5.3 10*3/uL (ref 3.8–10.8)

## 2021-05-28 LAB — VITAMIN B12: Vitamin B-12: 304 pg/mL (ref 200–1100)

## 2021-05-28 LAB — TSH: TSH: 1.48 mIU/L (ref 0.40–4.50)

## 2021-05-28 LAB — MAGNESIUM: Magnesium: 2.2 mg/dL (ref 1.5–2.5)

## 2021-05-28 LAB — VITAMIN D 25 HYDROXY (VIT D DEFICIENCY, FRACTURES): Vit D, 25-Hydroxy: 72 ng/mL (ref 30–100)

## 2021-08-08 DIAGNOSIS — Z96651 Presence of right artificial knee joint: Secondary | ICD-10-CM | POA: Diagnosis not present

## 2021-08-08 DIAGNOSIS — M1712 Unilateral primary osteoarthritis, left knee: Secondary | ICD-10-CM | POA: Diagnosis not present

## 2021-08-08 DIAGNOSIS — M545 Low back pain, unspecified: Secondary | ICD-10-CM | POA: Diagnosis not present

## 2021-08-26 DIAGNOSIS — M5451 Vertebrogenic low back pain: Secondary | ICD-10-CM | POA: Diagnosis not present

## 2021-09-03 DIAGNOSIS — M5451 Vertebrogenic low back pain: Secondary | ICD-10-CM | POA: Diagnosis not present

## 2021-09-09 DIAGNOSIS — M5451 Vertebrogenic low back pain: Secondary | ICD-10-CM | POA: Diagnosis not present

## 2021-09-12 DIAGNOSIS — Z961 Presence of intraocular lens: Secondary | ICD-10-CM | POA: Diagnosis not present

## 2021-09-12 DIAGNOSIS — H5213 Myopia, bilateral: Secondary | ICD-10-CM | POA: Diagnosis not present

## 2021-09-12 DIAGNOSIS — H401232 Low-tension glaucoma, bilateral, moderate stage: Secondary | ICD-10-CM | POA: Diagnosis not present

## 2021-09-16 DIAGNOSIS — M5451 Vertebrogenic low back pain: Secondary | ICD-10-CM | POA: Diagnosis not present

## 2021-09-22 DIAGNOSIS — M5451 Vertebrogenic low back pain: Secondary | ICD-10-CM | POA: Diagnosis not present

## 2021-09-29 DIAGNOSIS — M5451 Vertebrogenic low back pain: Secondary | ICD-10-CM | POA: Diagnosis not present

## 2021-10-01 DIAGNOSIS — M25562 Pain in left knee: Secondary | ICD-10-CM | POA: Diagnosis not present

## 2021-10-01 DIAGNOSIS — M1712 Unilateral primary osteoarthritis, left knee: Secondary | ICD-10-CM | POA: Diagnosis not present

## 2021-10-01 DIAGNOSIS — M545 Low back pain, unspecified: Secondary | ICD-10-CM | POA: Diagnosis not present

## 2021-10-06 DIAGNOSIS — M5451 Vertebrogenic low back pain: Secondary | ICD-10-CM | POA: Diagnosis not present

## 2021-10-13 DIAGNOSIS — M5451 Vertebrogenic low back pain: Secondary | ICD-10-CM | POA: Diagnosis not present

## 2021-10-20 DIAGNOSIS — M5451 Vertebrogenic low back pain: Secondary | ICD-10-CM | POA: Diagnosis not present

## 2021-10-27 DIAGNOSIS — M5451 Vertebrogenic low back pain: Secondary | ICD-10-CM | POA: Diagnosis not present

## 2021-11-03 DIAGNOSIS — M5451 Vertebrogenic low back pain: Secondary | ICD-10-CM | POA: Diagnosis not present

## 2021-11-05 NOTE — Progress Notes (Signed)
Annual Screening/Preventative Visit & Comprehensive Evaluation &  Examination  Future Appointments  Date Time Provider Department  11/06/2021 11:00 AM Unk Pinto, MD [facility]  05/27/2022  9:30 AM Liane Comber, NP [facility]  11/12/2022 11:00 AM Unk Pinto, MD [facility]        This very nice 81 y.o. [name]  presents for a Screening /Preventative Visit & comprehensive evaluation and management of multiple medical co-morbidities.  Patient has been followed for HTN, HLD, Prediabetes  and Vitamin D Deficiency.  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           Patient is c/o Weakness in her lower extremities  & poor balance progressive for several months. Denies any tremor.        HTN predates circa  2008. Patient's BP has been controlled at home and patient denies any cardiac symptoms as chest pain, palpitations, shortness of breath, dizziness or ankle swelling. Today's BP is at goal - 118/70 .       Patient's hyperlipidemia is controlled with diet and Rosuvastatin.   Patient denies myalgias or other medication SE's. Last lipids were at goal :  Lab Results  Component Value Date   CHOL 145 05/27/2021   HDL 62 05/27/2021   LDLCALC 67 05/27/2021   TRIG 80 05/27/2021   CHOLHDL 2.3 05/27/2021         Patient has hx/o prediabetes (A1c 6.0% /2011) and patient denies reactive hypoglycemic symptoms, visual blurring, diabetic polys or paresthesias. Last A1c was normal & at goal :  Lab Results  Component Value Date   HGBA1C 5.4 05/10/2020         Finally, patient has history of Vitamin D Deficiency and last Vitamin D was at goal :  Lab Results  Component Value Date   VD25OH 72 05/27/2021     Current Outpatient Medications on File Prior to Visit  Medication Sig   ALPRAZolam 0.5 MG tablet TAKE 1/2 -1  TABLET THREE TIMES DAILY AS NEEDED FOR ANXIETY   VITAMIN C  1000 MG tablet Take  daily.   aspirin EC 81 MG tablet Take at bedtime.    VITAMIN D 2000 units CAPS Take 1 capsule daily.   furosemide  40 MG tablet Take 1 tablet as needed   losartan-hctz 50-12.5 MG tablet Take  1 tablet  Daily  for BP   Naproxen 220 MG CAPS Take as needed.    Omega-3 FISH OIL 1000 MG C Take  times daily.   pregabalin 150 MG capsule Take 1 capsule 4 x /day   rosuvastatin  20 MG tablet Take 1 tablet MWF for Cholesterol   TIMOPTIC  0.5 % ophth soln Place 1 drop into both eyes every morning.    triamcinolone oint 0.1 % Apply  2  times daily.   zinc 50 MG tablet Take  daily.      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    Vitamin D deficiency      Health Maintenance  Topic Date Due   Hepatitis C Screening  Never done   Zoster Vaccines- Shingrix (1 of 2) Never done   COVID-19 Vaccine (5 - Booster for Moderna series) 03/14/2021   MAMMOGRAM  11/04/2021   TETANUS/TDAP  01/27/2022   Pneumonia Vaccine 110+ Years old  Completed   INFLUENZA VACCINE  Completed   DEXA SCAN  Completed   HPV VACCINES  Aged Out     Immunization History  Administered Date(s) Administered   Fluad Quad(high Dose) 05/23/2021   Influenza, High Dose  110/25/2019, 06/05/2019, 06/13/2020   Influenza 06/13/2013, 05/02/2015, 06/24/2018   Moderna Sars-Covid-2 Vaccination 09/13/2019, 10/11/2019, 07/01/2020, 01/17/2021   Pneumococcal -13 05/24/2014   Pneumococcal -23 01/08/2009    Tdap 01/28/2012   Zoster, Live 08/31/2006    Cologard  - 04/14/2017 - Positive     Last Colon - 06/15/2017 - Dr Havery Moros - Negative - recc no f/u due to age    Last MGM - 11/04/2020  Last dexaBMD - 01/21/2021   Past Surgical History:  Procedure Laterality Date   COLONOSCOPY     DILATION AND CURETTAGE OF UTERUS     age 50    right elbow   surgery  March 26, 2002   right hand ulner nerve surgery  March 26, 2002   I and d done right hand/wrist   right shoulder humerus fx  july 2003   surgery done   right total hip arthroplasty  2008   right total knee arthroplasty  sept 2012   right wrist plating  2004   TONSILLECTOMY  age 62   TOTAL HIP ARTHROPLASTY  10/22/2011   TOTAL HIP ARTHROPLASTY ANTERIOR APPROACH; Mauri Pole, MD     Family History  Problem Relation Age of Onset   Hypertension Mother    Diabetes Mother    Heart disease Mother        enlarged heart   Atrial fibrillation Mother    Diabetes Father    Heart disease Father    Heart attack Father 56   Breast cancer Maternal Aunt        early 50's   Breast cancer Maternal Grandmother        early 80's   Breast cancer Paternal Aunt        50's   Atrial fibrillation Brother    Atrial fibrillation Brother    Colon cancer Neg Hx    Esophageal cancer Neg Hx    Rectal cancer Neg Hx    Stomach cancer Neg Hx    Pancreatic cancer Neg Hx      Social History   Tobacco Use   Smoking status: Never   Smokeless tobacco: Never  Vaping Use   Vaping Use: Never used  Substance Use Topics   Alcohol use: Yes    Comment: social   Drug use: No      ROS Constitutional: Denies fever, chills, weight loss/gain, headaches, insomnia,  night sweats, and change in appetite. Does c/o fatigue. Eyes: Denies redness, blurred vision, diplopia, discharge, itchy, watery eyes.  ENT: Denies discharge, congestion, post nasal drip, epistaxis, sore throat, earache, hearing loss, dental pain, Tinnitus, Vertigo, Sinus pain, snoring.   Cardio: Denies chest pain, palpitations, irregular heartbeat, syncope,  dyspnea, diaphoresis, orthopnea, PND, claudication, edema Respiratory: denies cough, dyspnea, DOE, pleurisy, hoarseness, laryngitis, wheezing.  Gastrointestinal: Denies dysphagia, heartburn, reflux, water brash, pain, cramps, nausea, vomiting, bloating, diarrhea, constipation, hematemesis, melena, hematochezia, jaundice, hemorrhoids Genitourinary: Denies dysuria, frequency, urgency, nocturia, hesitancy, discharge, hematuria, flank pain Breast: Breast lumps, nipple discharge, bleeding.  Musculoskeletal: Denies arthralgia, myalgia, stiffness, Jt. Swelling, pain, limp, and strain/sprain. Denies falls. Skin: Denies puritis, rash, hives, warts, acne, eczema, changing in skin lesion Neuro: No weakness, tremor, incoordination, spasms, paresthesia, pain Psychiatric: Denies confusion, memory loss, sensory loss. Denies Depression. Endocrine: Denies change in weight, skin, hair change, nocturia, and paresthesia, diabetic polys, visual blurring, hyper / hypo glycemic episodes.  Heme/Lymph: No excessive bleeding, bruising, enlarged lymph nodes.  Physical Exam  BP 118/70    Pulse 67    Temp 97.9 F (36.6 C)    Resp 16    Ht 5' 2.25" (1.581 m)    Wt 164 lb 12.8 oz (74.8 kg)    SpO2 97%    BMI 29.90 kg/m   General Appearance: Over nourished, well groomed and in no apparent distress.  Eyes: PERRLA, EOMs, conjunctiva no swelling or erythema, normal fundi and vessels. Sinuses: No frontal/maxillary tenderness ENT/Mouth: EACs patent / TMs  nl. Nares clear without erythema, swelling, mucoid exudates. Oral hygiene is good. No erythema, swelling, or exudate. Tongue normal, non-obstructing. Tonsils not swollen or erythematous. Hearing normal.  Neck: Supple, thyroid not palpable. No bruits, nodes or JVD. Respiratory: Respiratory effort normal.  BS equal and clear bilateral without rales, rhonci, wheezing or stridor. Cardio: Heart sounds are  normal with regular rate and rhythm and no murmurs, rubs or gallops. Peripheral pulses are normal and equal bilaterally without edema. No aortic or femoral bruits. Chest: symmetric with normal excursions and percussion. Breasts: Symmetric, without lumps, nipple discharge, retractions, or fibrocystic changes.  Abdomen: Flat, soft with bowel sounds active. Nontender, no guarding, rebound, hernias, masses, or organomegaly.  Lymphatics: Non tender without lymphadenopathy.  Musculoskeletal: Full ROM all peripheral extremities, joint stability, 5/5 strength, and normal gait. Skin: Warm and dry without rashes, lesions, cyanosis, clubbing or  ecchymosis.  Neuro:  Cranial nerves intact, reflexes equal bilaterally. Masked facies. Increased muscle tone. ? Mild bradykinesia.  No cerebellar symptoms.  No tremor. Sensation intact.  Pysch: Alert and oriented X 3, normal affect, Insight and Judgment appropriate.    Assessment and Plan  1. Annual Preventative Screening Examination   2. Essential hypertension  - EKG 12-Lead - Urinalysis, Routine w reflex microscopic - Microalbumin / creatinine urine ratio - CBC with Differential/Platelet - COMPLETE METABOLIC PANEL WITH GFR - Magnesium - TSH  3. Hyperlipidemia, mixed  - EKG 12-Lead - Lipid panel - TSH  4. Abnormal glucose  - EKG 12-Lead - Hemoglobin A1c - Insulin, random  5. Vitamin D deficiency  - VITAMIN D 25 Hydroxy   6. 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 Screening for colorectal cancer  - POC Hemoccult Bld/Stl   8. Screening for ischemic heart disease  - EKG 12-Lead  9.  ? Early Parkinson's Disease  vs Adverse effect of Pregabalin   - Neuro consult   10.  Medication management  - Urinalysis, Routine w reflex microscopic - Microalbumin / creatinine urine ratio - COMPLETE METABOLIC PANEL WITH GFR - Magnesium - Lipid panel - TSH - Hemoglobin A1c - Insulin, random - VITAMIN D  25 Hydroxy          Patient was counseled  in prudent diet to achieve/maintain BMI less than 25 for weight control, BP monitoring, regular exercise and medications. Discussed med's effects and SE's. Screening labs and tests as requested with regular follow-up as recommended. Over 40 minutes of exam, counseling, chart review and high complex critical decision making was performed.   Kirtland Bouchard, MD

## 2021-11-05 NOTE — Patient Instructions (Signed)

## 2021-11-06 ENCOUNTER — Encounter: Payer: Self-pay | Admitting: Internal Medicine

## 2021-11-06 ENCOUNTER — Ambulatory Visit (INDEPENDENT_AMBULATORY_CARE_PROVIDER_SITE_OTHER): Payer: Medicare HMO | Admitting: Internal Medicine

## 2021-11-06 ENCOUNTER — Other Ambulatory Visit: Payer: Self-pay

## 2021-11-06 VITALS — BP 118/70 | HR 67 | Temp 97.9°F | Resp 16 | Ht 62.25 in | Wt 164.8 lb

## 2021-11-06 DIAGNOSIS — I10 Essential (primary) hypertension: Secondary | ICD-10-CM | POA: Diagnosis not present

## 2021-11-06 DIAGNOSIS — Z0001 Encounter for general adult medical examination with abnormal findings: Secondary | ICD-10-CM

## 2021-11-06 DIAGNOSIS — R259 Unspecified abnormal involuntary movements: Secondary | ICD-10-CM

## 2021-11-06 DIAGNOSIS — G90519 Complex regional pain syndrome I of unspecified upper limb: Secondary | ICD-10-CM

## 2021-11-06 DIAGNOSIS — Z136 Encounter for screening for cardiovascular disorders: Secondary | ICD-10-CM

## 2021-11-06 DIAGNOSIS — Z1211 Encounter for screening for malignant neoplasm of colon: Secondary | ICD-10-CM

## 2021-11-06 DIAGNOSIS — Z0000 Encounter for general adult medical examination without abnormal findings: Secondary | ICD-10-CM | POA: Diagnosis not present

## 2021-11-06 DIAGNOSIS — E782 Mixed hyperlipidemia: Secondary | ICD-10-CM

## 2021-11-06 DIAGNOSIS — E559 Vitamin D deficiency, unspecified: Secondary | ICD-10-CM

## 2021-11-06 DIAGNOSIS — Z1212 Encounter for screening for malignant neoplasm of rectum: Secondary | ICD-10-CM

## 2021-11-06 DIAGNOSIS — R7309 Other abnormal glucose: Secondary | ICD-10-CM | POA: Diagnosis not present

## 2021-11-06 DIAGNOSIS — Z79899 Other long term (current) drug therapy: Secondary | ICD-10-CM

## 2021-11-07 LAB — COMPLETE METABOLIC PANEL WITH GFR
AG Ratio: 1.6 (calc) (ref 1.0–2.5)
ALT: 23 U/L (ref 6–29)
AST: 29 U/L (ref 10–35)
Albumin: 4.2 g/dL (ref 3.6–5.1)
Alkaline phosphatase (APISO): 76 U/L (ref 37–153)
BUN/Creatinine Ratio: 35 (calc) — ABNORMAL HIGH (ref 6–22)
BUN: 33 mg/dL — ABNORMAL HIGH (ref 7–25)
CO2: 28 mmol/L (ref 20–32)
Calcium: 9.5 mg/dL (ref 8.6–10.4)
Chloride: 105 mmol/L (ref 98–110)
Creat: 0.93 mg/dL (ref 0.60–1.00)
Globulin: 2.6 g/dL (calc) (ref 1.9–3.7)
Glucose, Bld: 90 mg/dL (ref 65–99)
Potassium: 4.5 mmol/L (ref 3.5–5.3)
Sodium: 141 mmol/L (ref 135–146)
Total Bilirubin: 0.5 mg/dL (ref 0.2–1.2)
Total Protein: 6.8 g/dL (ref 6.1–8.1)
eGFR: 63 mL/min/{1.73_m2} (ref >=60)

## 2021-11-07 LAB — URINALYSIS, ROUTINE W REFLEX MICROSCOPIC
Bacteria, UA: NONE SEEN /HPF
Bilirubin Urine: NEGATIVE
Glucose, UA: NEGATIVE
Hgb urine dipstick: NEGATIVE
Hyaline Cast: NONE SEEN /LPF
Ketones, ur: NEGATIVE
Nitrite: NEGATIVE
Protein, ur: NEGATIVE
Specific Gravity, Urine: 1.025 (ref 1.001–1.035)
pH: 5.5 (ref 5.0–8.0)

## 2021-11-07 LAB — MAGNESIUM: Magnesium: 2.1 mg/dL (ref 1.5–2.5)

## 2021-11-07 LAB — CBC WITH DIFFERENTIAL/PLATELET
Absolute Monocytes: 650 cells/uL (ref 200–950)
Basophils Absolute: 27 cells/uL (ref 0–200)
Basophils Relative: 0.4 %
Eosinophils Absolute: 201 cells/uL (ref 15–500)
Eosinophils Relative: 3 %
HCT: 40 % (ref 35.0–45.0)
Hemoglobin: 13.1 g/dL (ref 11.7–15.5)
Lymphs Abs: 1454 cells/uL (ref 850–3900)
MCH: 29.6 pg (ref 27.0–33.0)
MCHC: 32.8 g/dL (ref 32.0–36.0)
MCV: 90.3 fL (ref 80.0–100.0)
MPV: 11.8 fL (ref 7.5–12.5)
Monocytes Relative: 9.7 %
Neutro Abs: 4368 cells/uL (ref 1500–7800)
Neutrophils Relative %: 65.2 %
Platelets: 269 10*3/uL (ref 140–400)
RBC: 4.43 10*6/uL (ref 3.80–5.10)
RDW: 13.6 % (ref 11.0–15.0)
Total Lymphocyte: 21.7 %
WBC: 6.7 10*3/uL (ref 3.8–10.8)

## 2021-11-07 LAB — MICROSCOPIC MESSAGE

## 2021-11-07 LAB — HEMOGLOBIN A1C
Hgb A1c MFr Bld: 5.8 % of total Hgb — ABNORMAL HIGH (ref <5.7)
Mean Plasma Glucose: 120 mg/dL
eAG (mmol/L): 6.6 mmol/L

## 2021-11-07 LAB — MICROALBUMIN / CREATININE URINE RATIO
Creatinine, Urine: 135 mg/dL (ref 20–275)
Microalb Creat Ratio: 10 mcg/mg creat (ref <30)
Microalb, Ur: 1.3 mg/dL

## 2021-11-07 LAB — TSH: TSH: 1.39 mIU/L (ref 0.40–4.50)

## 2021-11-07 LAB — LIPID PANEL
Cholesterol: 145 mg/dL (ref <200)
HDL: 66 mg/dL (ref >=50)
LDL Cholesterol (Calc): 65 mg/dL (calc)
Non-HDL Cholesterol (Calc): 79 mg/dL (calc) (ref <130)
Total CHOL/HDL Ratio: 2.2 (calc) (ref <5.0)
Triglycerides: 63 mg/dL (ref <150)

## 2021-11-07 LAB — INSULIN, RANDOM: Insulin: 9 u[IU]/mL

## 2021-11-07 LAB — VITAMIN D 25 HYDROXY (VIT D DEFICIENCY, FRACTURES): Vit D, 25-Hydroxy: 71 ng/mL (ref 30–100)

## 2021-11-07 NOTE — Progress Notes (Signed)
<>  0<>0<>0<>0<>0<>0<>0<>0<>0<>0<>0<>0<>0<>0<>0<>0<>0<>0<>0<>0<>0<>0<> ?<>0<>0<>0<>0<>0<>0<>0<>0<>0<>0<>0<>0<>0<>0<>0<>0<>0<>0<>0<>0<>0<>0<> ? ?- Test results slightly outside the reference range are not unusual. ?If there is anything important, I will review this with you,  ?otherwise it is considered normal test values.  ?If you have further questions,  ?please do not hesitate to contact me at the office or via My Chart.  ? ?<>0<>0<>0<>0<>0<>0<>0<>0<>0<>0<>0<>0<>0<>0<>0<>0<>0<>0<>0<>0<>0<>0<> ?<>0<>0<>0<>0<>0<>0<>0<>0<>0<>0<>0<>0<>0<>0<>0<>0<>0<>0<>0<>0<>0<>0<> ? ?-  A1c = 5.8% -   Blood sugar and A1c are elevated in the borderline and  ?                                                           early or pre-diabetes range which has the same  ? ?300% increased risk for heart attack, stroke, cancer and  ?                                            alzheimer- type vascular dementia as full blown diabetes.  ? ?But the good news is that diet, exercise with weight loss can  ?                                                                              cure the early diabetes at this point. ? ?<>0<>0<>0<>0<>0<>0<>0<>0<>0<>0<>0<>0<>0<>0<>0<>0<>0<>0<>0<>0<>0<>0<> ?<>0<>0<>0<>0<>0<>0<>0<>0<>0<>0<>0<>0<>0<>0<>0<>0<>0<>0<>0<>0<>0<>0<> ? ?-  Total Chol = 145    &   LDL Chol = 65    - Both  Excellent  ? ?- Very low risk for Heart Attack  / Stroke ? ?<>0<>0<>0<>0<>0<>0<>0<>0<>0<>0<>0<>0<>0<>0<>0<>0<>0<>0<>0<>0<>0<>0<> ?<>0<>0<>0<>0<>0<>0<>0<>0<>0<>0<>0<>0<>0<>0<>0<>0<>0<>0<>0<>0<>0<>0<> ? ?-  Vitamin D = 71  -    Excellent  !   - Please keep dose same  ? ?<>0<>0<>0<>0<>0<>0<>0<>0<>0<>0<>0<>0<>0<>0<>0<>0<>0<>0<>0<>0<>0<>0<> ?<>0<>0<>0<>0<>0<>0<>0<>0<>0<>0<>0<>0<>0<>0<>0<>0<>0<>0<>0<>0<>0<>0<> ? ?-  All Else - CBC - Kidneys - Electrolytes - Liver - Magnesium & Thyroid   ? ?- all  Normal /  OK ? ?<>0<>0<>0<>0<>0<>0<>0<>0<>0<>0<>0<>0<>0<>0<>0<>0<>0<>0<>0<>0<>0<>0<> ?<>0<>0<>0<>0<>0<>0<>0<>0<>0<>0<>0<>0<>0<>0<>0<>0<>0<>0<>0<>0<>0<>0<> ? ?-   ? ? ? ? ?

## 2021-11-10 DIAGNOSIS — M5451 Vertebrogenic low back pain: Secondary | ICD-10-CM | POA: Diagnosis not present

## 2021-11-11 NOTE — Progress Notes (Signed)
? ? ?Assessment/Plan:  ? ? Mild R bradykinesia ?Patient does have mild bradykinesia on the right side with some rigidity in the right arm. ?Going to go ahead and do a DaTscan and see what that looks like. ?Discussed with the patient that this does not at all explain her walking.  That does not look neurologic, and really looks orthopedic to me.  She has a very antalgic type of gait.  She likely needs follow-up elsewhere in that regard. ? ?2.  B12 deficiency ? -pt with evidence of B12 deficiency.  Discussed with the patient that neurologically, would like to see B12 levels greater than 400.  Would recommend oral B12 supplementation, 1000 mcg daily. ? ?3.  Possible PN ? -She does have some evidence of mild peripheral neuropathy, which could explain both paresthesias in the toes.  This, however, really does not explain her other symptoms, and especially does not explain her gait. ? ? ?Subjective:  ? ?Gwendolyn [name] was seen today in the movement disorders clinic for neurologic consultation at the request of Unk Pinto, MD.  The consultation is for the evaluation of gait instability and to rule out Parkinson's disease (versus possible side effect of Lyrica).  Medical records made available to me are reviewed. ? ? ?Specific Symptoms:  ?Tremor: No. ?Family hx of similar:  dad and GF had tremor but no hx of Parkinsons Disease  ?Voice: ? A friend has told her she is more quiet but she isn't sure he doesn't hear well ?Sleep: some trouble due to leg cramps and due to arthritic pain and possible rls ? Vivid Dreams:  No. ? Acting out dreams:  No. ?Wet Pillows: No. ?Postural symptoms:  Yes.  , seemed to start in October.  Toes seem "numb."  No paresthesias of rest of feet.  States that she noted "wobbling like a duck."  Stated went on cruise last fall and she noted that she was having trouble in airport and noted decreased arm swing.   ? Falls?  Yes.  , fell getting off a cruise ship and onto cruise tender (sounds  like a reasonable fall); no other falls ?Bradykinesia symptoms: slow movements trouble getting in and out of car and told by PT due to hip weakness (she has had hip replacements bilaterally); no shuffling - "its just short and wobbly" ?Loss of smell:  No. ?Loss of taste:  No. ?Urinary Incontinence:  No. But has frequency ?Difficulty Swallowing:  No. ?Handwriting, micrographia: No. But its more tremulous ?Trouble with ADL's:  No. ? Trouble buttoning clothing: No. ?Depression: she is "sad" -  she admits to anxiety (she is caregiving for a friend and not sure that she wants to do that) ?Memory changes:  No. ?Hallucinations:  No. ? visual distortions: No. ?N/V:  No. ?Lightheaded:  No. ? Syncope: No. ?Diplopia:  No. ?Dyskinesia:  No. ?Prior exposure to reglan/antipsychotics: No. ? ?Neuroimaging of the brain has not previously been performed.   ? ?Admits to chronic pain R hand - [place] and now on lyrica 150 mg q hs (med list reports qid but pt reports taking qid) ? ?PREVIOUS MEDICATIONS: none to date ? ?ALLERGIES:   ?Allergies  ?Allergen Reactions  ? Oxycodone-Acetaminophen Other (See Comments)  ?  panic attack  ? Vit C-Cholecalciferol-Rose Hip [Cholecalciferol-Vitamin C] Other (See Comments)  ?  dyspepsia  ? Ibuprofen Hives  ? Macrodantin [Nitrofurantoin Macrocrystal] Rash  ? ? ?CURRENT MEDICATIONS:  ?Current Meds  ?Medication Sig  ?  ALPRAZolam (XANAX) 0.5 MG tablet TAKE 1/2 TO 1 (ONE-HALF TO ONE) TABLET BY MOUTH THREE TIMES DAILY AS NEEDED FOR ANXIETY  ? Ascorbic Acid (VITAMIN C) 1000 MG tablet Take 1,000 mg by mouth daily.  ? aspirin EC 81 MG tablet Take 81 mg by mouth at bedtime.  ? Cholecalciferol (VITAMIN D) 2000 units CAPS Take 1 capsule by mouth daily.  ? losartan-hydrochlorothiazide (HYZAAR) 50-12.5 MG tablet Take  1 tablet  Daily  for BP  ? Naproxen Sodium 220 MG CAPS Take by mouth as needed.   ? Omega-3 Fatty Acids (FISH OIL) 1000 MG CAPS Take by mouth 2 (two) times daily.  ?  pregabalin (LYRICA) 150 MG capsule Take 1 capsule    4 x /day      for Chronic Pain  ? rosuvastatin (CRESTOR) 20 MG tablet Take 1 tablet MWF for Cholesterol  ? timolol (TIMOPTIC) 0.5 % ophthalmic solution Place 1 drop into both eyes every morning.   ? triamcinolone ointment (KENALOG) 0.1 % Apply 1 application topically 2 (two) times daily.  ? zinc gluconate 50 MG tablet Take 50 mg by mouth daily.  ?  ? ?Objective:  ? ?VITALS:   ?Vitals:  ? 11/13/21 0956  ?BP: (!) 142/77  ?Pulse: 70  ?SpO2: 98%  ?Weight: 166 lb 6.4 oz (75.5 kg)  ?Height: '5\' 2"'$  (1.575 m)  ? ? ?GEN:  The patient appears stated age and is in NAD. ?HEENT:  Normocephalic, atraumatic.  The mucous membranes are moist. The superficial temporal arteries are without ropiness or tenderness. ?CV:  RRR ?Lungs:  CTAB ?Neck/HEME:  There are no carotid bruits bilaterally. ? ?Neurological examination: ? ?Orientation: The patient is alert and oriented x3.  ?Cranial nerves: There is good facial symmetry. Extraocular muscles are intact. The visual fields are full to confrontational testing. The speech is fluent and clear. Soft palate rises symmetrically and there is no tongue deviation. Hearing is intact to conversational tone. ?Sensation: Sensation is intact to light and pinprick throughout (facial, trunk, extremities). Vibration is decreased at the bilateral big toe. There is no extinction with double simultaneous stimulation. There is no sensory dermatomal level identified. ?Motor: Strength is 5/5 in the bilateral upper and lower extremities.   Shoulder shrug is equal and symmetric.  There is no pronator drift. ?Deep tendon reflexes: Deep tendon reflexes are 2/4 at the bilateral biceps, triceps, brachioradialis, patella and achilles. Plantar responses are downgoing bilaterally. ? ?Movement examination: ?Tone: There is mild increased tone in the RUE ?Abnormal movements: none even with distraction in the hands/legs.  Has some mouth tremor ?Coordination:  There is   decremation with RAM's, with finger taps bilaterally and hand opening and closing bilaterally, R>L ?Gait and Station: The patient is able to arise without the use of her hands.  Pt has very antalgic gait (knees are varus).  She does not shuffle.  She does not turn en bloc.  She really is not short step. ?I have reviewed and interpreted the following labs independently ?  Chemistry   ?   ?Component Value Date/Time  ? NA 141 11/06/2021 1102  ? K 4.5 11/06/2021 1102  ? CL 105 11/06/2021 1102  ? CO2 28 11/06/2021 1102  ? BUN 33 (H) 11/06/2021 1102  ? CREATININE 0.93 11/06/2021 1102  ?    ?Component Value Date/Time  ? CALCIUM 9.5 11/06/2021 1102  ? ALKPHOS 87 03/31/2017 0927  ? AST 29 11/06/2021 1102  ? ALT 23 11/06/2021 1102  ? BILITOT 0.5 11/06/2021 1102  ?  ? ? ?  Lab Results  ?Component Value Date  ? TSH 1.39 11/06/2021  ? ?Lab Results  ?Component Value Date  ? WBC 6.7 11/06/2021  ? HGB 13.1 11/06/2021  ? HCT 40.0 11/06/2021  ? MCV 90.3 11/06/2021  ? PLT 269 11/06/2021  ? ?Lab Results  ?Component Value Date  ? HGBA1C 5.8 (H) 11/06/2021  ? ?Lab Results  ?Component Value Date  ? [id number] 304 05/27/2021  ? ? ? ?Total time spent on today's visit was 45 minutes, including both face-to-face time and nonface-to-face time.  Time included that spent on review of records (prior notes available to me/labs/imaging if pertinent), discussing treatment and goals, answering patient's questions and coordinating care. ? ?Cc:  Unk Pinto, MD ? ?

## 2021-11-13 ENCOUNTER — Ambulatory Visit: Payer: Medicare HMO | Admitting: Neurology

## 2021-11-13 ENCOUNTER — Other Ambulatory Visit: Payer: Self-pay

## 2021-11-13 ENCOUNTER — Encounter: Payer: Self-pay | Admitting: Neurology

## 2021-11-13 VITALS — BP 142/77 | HR 70 | Ht 62.0 in | Wt 166.4 lb

## 2021-11-13 DIAGNOSIS — R251 Tremor, unspecified: Secondary | ICD-10-CM | POA: Diagnosis not present

## 2021-11-13 DIAGNOSIS — R269 Unspecified abnormalities of gait and mobility: Secondary | ICD-10-CM | POA: Diagnosis not present

## 2021-11-13 DIAGNOSIS — R258 Other abnormal involuntary movements: Secondary | ICD-10-CM | POA: Diagnosis not present

## 2021-11-17 DIAGNOSIS — M5451 Vertebrogenic low back pain: Secondary | ICD-10-CM | POA: Diagnosis not present

## 2021-11-25 ENCOUNTER — Other Ambulatory Visit: Payer: Self-pay

## 2021-11-25 ENCOUNTER — Encounter (HOSPITAL_COMMUNITY)
Admission: RE | Admit: 2021-11-25 | Discharge: 2021-11-25 | Disposition: A | Discharge status: Home / Self Care | Payer: Medicare HMO | Source: Ambulatory Visit | Attending: Neurology | Admitting: Neurology

## 2021-11-25 DIAGNOSIS — G20 Parkinson's disease: Secondary | ICD-10-CM | POA: Diagnosis not present

## 2021-11-25 DIAGNOSIS — R251 Tremor, unspecified: Secondary | ICD-10-CM | POA: Insufficient documentation

## 2021-11-25 DIAGNOSIS — R269 Unspecified abnormalities of gait and mobility: Secondary | ICD-10-CM | POA: Diagnosis not present

## 2021-11-25 MED ORDER — IOFLUPANE I 123 185 MBQ/2.5ML IV SOLN
4.5000 | Freq: Once | INTRAVENOUS | Status: AC | PRN
  Administered 2021-11-25: 4.5 via INTRAVENOUS
  Filled 2021-11-25: qty 5

## 2021-11-25 MED ORDER — POTASSIUM IODIDE (ANTIDOTE) 130 MG PO TABS
ORAL_TABLET | ORAL | Status: AC
  Filled 2021-11-25: qty 1

## 2021-11-27 NOTE — Progress Notes (Signed)
? ? ?Assessment/Plan:  ? ?1.  Parkinsons Disease (although discussed with her that atypical state cannot completely be ruled out) ? -Patient with mild bradykinesia on the right and some rigidity in the right arm. ? -Patient's DaTscan positive November 25, 2021 with decreased radiotracer activity bilaterally, right greater than left. ? -Discussed again with patient that the above findings/diagnosis do not explain her walking.  That does not look neurologic and really looks more orthopedic.  She has an antalgic type of gait and really needs to be followed up elsewhere.  She is following with Dr. Alvan Dame.  She is finishing up physical therapy, although she is not really sure it has helped. ? -Discussed nature and pathophysiology.  Discussed treatments.  Discussed levodopa.  She would like to trial that.  We will slowly work to carbidopa/levodopa 25/100, 1 tablet at 7 AM/11 AM/4 PM.  Discussed risk, benefits, side effects. ? -Discussed community resources.  Met with my LCSW today.  Discussed with her that we can do physical therapy for Parkinson's disease (she also asked about aqua therapy) once she is done with the physical therapy for her hips. ? 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  She is experiencing great caregiver burden.  I am not sure she is going to continue to be able to do this and we discussed this in detail today.  This partner has [facility] resources, but is not using them for respite and is using her alone.  She is overwhelmed by that.  Encouraged her to use our LCSW for counseling.  Patient is going to need her time for counseling and adjustment to her own diagnoses. ? ?2.  B12 deficiency ?            -pt with evidence of B12 deficiency.  Discussed with the patient that neurologically, would like to see B12 levels greater than 400.  Would recommend oral B12 supplementation, 1000 mcg daily. ?  ?3.  Possible PN ?            -She does have some evidence of  mild peripheral neuropathy, which could explain both paresthesias in the toes.  This, however, really does not explain her other symptoms, and especially does not explain her gait. ?Subjective:  ? ?Gwendolyn [name] was seen today in follow up for Parkinsons disease.  My previous records were reviewed prior to todays visit as well as outside records available to me. Pt had DaTscan completed on November 25, 2021.  I reviewed this.  There is decreased radiotracer activity bilaterally, with greater severity on the right than the left. ? ?Current prescribed movement disorder medications: ?None ? ? ?PREVIOUS MEDICATIONS: none to date ? ?ALLERGIES:   ?Allergies  ?Allergen Reactions  ? Oxycodone-Acetaminophen Other (See Comments)  ?  panic attack  ? Vit C-Cholecalciferol-Rose Hip [Cholecalciferol-Vitamin C] Other (See Comments)  ?  dyspepsia  ? Ibuprofen Hives  ? Macrodantin [Nitrofurantoin Macrocrystal] Rash  ? ? ?CURRENT MEDICATIONS:  ?No outpatient medications have been marked as taking for the 11/28/21 encounter (Appointment) with Eniyah Eastmond, Eustace Quail, DO.  ? ? ? ?Objective:  ? ?PHYSICAL EXAMINATION:   ? ?VITALS:  There were no vitals filed for this visit. ? ?GEN:  The patient appears stated age and is in NAD. ?HEENT:  Normocephalic, atraumatic.  The mucous membranes are moist. The superficial temporal arteries are without ropiness or tenderness. ?CV:  RRR ?Lungs:  CTAB ?Neck/HEME:  There are no carotid bruits bilaterally. ? ?  Neurological examination: ? ?Orientation: The patient is alert and oriented x3. ?Cranial nerves: There is good facial symmetry without facial hypomimia. The speech is fluent and clear. Soft palate rises symmetrically and there is no tongue deviation. Hearing is intact to conversational tone. ?Sensation: Sensation is intact to light touch throughout ?Motor: Strength is at least antigravity x4. ? ?Movement examination: ?Tone: There is mild increased tone in the RUE ?Abnormal movements: none even with  distraction in the hands/legs.  Has some mouth tremor ?Coordination:  There is  decremation with RAM's, with finger taps bilaterally and hand opening and closing bilaterally, R>L ?Gait and Station: The patient is able to arise without the use of her hands.  Pt has very antalgic gait (knees are varus).  She does not shuffle.  She does not turn en bloc.  She really is not short step. ? ?I have reviewed and interpreted the following labs independently ? ?  Chemistry   ?   ?Component Value Date/Time  ? NA 141 11/06/2021 1102  ? K 4.5 11/06/2021 1102  ? CL 105 11/06/2021 1102  ? CO2 28 11/06/2021 1102  ? BUN 33 (H) 11/06/2021 1102  ? CREATININE 0.93 11/06/2021 1102  ?    ?Component Value Date/Time  ? CALCIUM 9.5 11/06/2021 1102  ? ALKPHOS 87 03/31/2017 0927  ? AST 29 11/06/2021 1102  ? ALT 23 11/06/2021 1102  ? BILITOT 0.5 11/06/2021 1102  ?  ? ? ? ?Lab Results  ?Component Value Date  ? WBC 6.7 11/06/2021  ? HGB 13.1 11/06/2021  ? HCT 40.0 11/06/2021  ? MCV 90.3 11/06/2021  ? PLT 269 11/06/2021  ? ? ?Lab Results  ?Component Value Date  ? TSH 1.39 11/06/2021  ? ? ? ?Total time spent on today's visit was 30mnutes, including both face-to-face time and nonface-to-face time.  Time included that spent on review of records (prior notes available to me/labs/imaging if pertinent), discussing treatment and goals, answering patient's questions and coordinating care. ? ?Cc:  MUnk Pinto MD ? ?

## 2021-11-28 ENCOUNTER — Ambulatory Visit (INDEPENDENT_AMBULATORY_CARE_PROVIDER_SITE_OTHER): Payer: Medicare HMO | Admitting: Neurology

## 2021-11-28 ENCOUNTER — Encounter: Payer: Self-pay | Admitting: Neurology

## 2021-11-28 VITALS — BP 140/79 | HR 79 | Ht 63.0 in | Wt 166.4 lb

## 2021-11-28 DIAGNOSIS — G20 Parkinson's disease: Secondary | ICD-10-CM

## 2021-11-28 MED ORDER — CARBIDOPA-LEVODOPA 25-100 MG PO TABS: 1.0000 | ORAL_TABLET | Freq: Three times a day (TID) | ORAL | 1 refills | Status: DC

## 2021-11-28 NOTE — Patient Instructions (Signed)
Start Carbidopa Levodopa as follows: Take 1/2 tablet three times daily, at least 30 minutes before meals (approximately 7am/11am/4pm), for one week Then take 1/2 tablet in the morning, 1/2 tablet in the afternoon, 1 tablet in the evening, at least 30 minutes before meals, for one week Then take 1/2 tablet in the morning, 1 tablet in the afternoon, 1 tablet in the evening, at least 30 minutes before meals, for one week Then take 1 tablet three times daily at 7am/11am/4pm, at least 30 minutes before meals   As a reminder, carbidopa/levodopa can be taken at the same time as a carbohydrate, but we like to have you take your pill either 30 minutes before a protein source or 1 hour after as protein can interfere with carbidopa/levodopa absorption.  

## 2021-12-11 ENCOUNTER — Ambulatory Visit: Payer: Medicare HMO | Admitting: Licensed Clinical Social Worker

## 2021-12-11 DIAGNOSIS — F4321 Adjustment disorder with depressed mood: Secondary | ICD-10-CM

## 2021-12-11 DIAGNOSIS — M5451 Vertebrogenic low back pain: Secondary | ICD-10-CM | POA: Diagnosis not present

## 2021-12-16 ENCOUNTER — Ambulatory Visit
Admission: RE | Admit: 2021-12-16 | Discharge: 2021-12-16 | Disposition: A | Discharge status: Home / Self Care | Payer: Medicare HMO | Source: Ambulatory Visit | Attending: Internal Medicine | Admitting: Internal Medicine

## 2021-12-16 ENCOUNTER — Other Ambulatory Visit: Payer: Self-pay | Admitting: Internal Medicine

## 2021-12-16 ENCOUNTER — Telehealth: Payer: Self-pay | Admitting: Licensed Clinical Social Worker

## 2021-12-16 DIAGNOSIS — Z1231 Encounter for screening mammogram for malignant neoplasm of breast: Secondary | ICD-10-CM

## 2021-12-16 NOTE — Telephone Encounter (Signed)
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 Pt indicated that she had called the [place] office in [place] as suggested via email, and needed to follow up with them. Pt also indicated that Bill's power of attorney (POA) had agreed to stay with him while Pt took a trip to [place] to visit her son. Pt was hopeful that this respite would help the POA realize how much help Bill needed, and would in turn help the Pt find more resources for Bill. Overall, the Pt said things were looking better. She indicated taking her medications as prescribed, as well as doing some stretching on her own. Pt hopes to be able to be referred to physical therapy for her Parkinson's next time she sees Dr. Carles Collet. Pt was informed about some more information for water aerobics classes, and requested further information via email. Student Animal nutritionist Pt with more information on water aerobics at [employer] and through the [employer] and rec office, and other general fitness classes at [employer], [employer], and [employer]. Student Education officer, museum also emailed the support group information and the recent presentation made to the support group on anger and resentment as requested. ?

## 2021-12-16 NOTE — BH Specialist Note (Signed)
Integrated Behavioral Health Initial In-Person Visit ? ?MRN: [id number] ?Name: Gwendolyn [name] ? ?Number of [place] Clinician visits: No data recorded ?Session Start time: 0900 ?   ?Session End time: 1173 ? ?Total time in minutes: 3 ? ? ?Types of Service: Individual psychotherapy ? ?Interpretor:No. Interpretor Name and Language: NA ? ? ? Warm Hand Off Completed. ? ?  ? ?  ? ? ?Subjective: ?Gwendolyn [name] is a 81 y.o. female accompanied by  Self ?Patient was referred by Dr. 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 ?Patient reports the following symptoms/concerns: New Dx and care for someone with Parkinson's reports feelings of feelings of being sad, hopeless loss of energy difficulty function in daily life, ,  ?Duration of problem: Several weeks ; Severity of problem: moderate ? ?Objective: ?Mood: Depressed and Affect: Appropriate ?Risk of harm to self or others: No plan to harm self or others ? ?Life Context: ?Family and Social: Pt is retired and takes care of someone with Parkinson's  ?School/Work: Pt is retired  ?Self-Care: Pt was active and able to complete ADL's but does spend most time at home attending to needs of friend that she takes care of  ?Life Changes: New DX of the person she takes care of  ? ?Patient and/or Family's Strengths/Protective Factors: ?Concrete supports in place (healthy food, safe environments, etc.) ? ?Goals Addressed: ?Patient will: ?Reduce symptoms of: depression ?Increase knowledge and/or ability of: coping skills and stress reduction  ?Demonstrate ability to: Increase healthy adjustment to current life circumstances and Increase adequate support systems for patient/family ? ?Progress towards Goals: ?Ongoing ? ?Interventions: ?Interventions utilized: Solution-Focused Strategies, Behavioral Activation, and CBT Cognitive Behavioral Therapy  ?Standardized Assessments completed: Not Needed ? ?Patient and/or Family Response: Pt open to interventions  provided  ? ?Patient Centered Plan: ?Patient is on the following Treatment Plan(s):  reduce feeling of depression and isolations, increase feelings of vitality and increase support .  Replace depression promoting thoughts with mood elevating thoughts and increase support and services for her in the community   ? 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 ?Patient reports the following symptoms/concerns: New Dx and care for someone with Parkinson's reports feelings of feelings of being sad, hopeless loss of energy difficulty function in daily life, , . ?  ?Patient may benefit from Replace depression promoting thoughts with mood elevating thoughts and increase support and services for her in the community  . ? ?Plan: ?Follow up with behavioral health clinician on : In three to four weeks ?Behavioral recommendations: Follow up with Support Group, Get services for friend lined up to have vacation and increase self care   ?Referral(s): [employer] Resources:  [employer] Parkinson's Support and exercise Support Group ?"From scale of 1-10, how likely are you to follow plan?": 10 ? ?Advith Martine A Taylor-Paladino, LCSW ? ? ? ? ? ? ? ? ?

## 2021-12-30 DIAGNOSIS — H02834 Dermatochalasis of left upper eyelid: Secondary | ICD-10-CM | POA: Diagnosis not present

## 2021-12-30 DIAGNOSIS — H02831 Dermatochalasis of right upper eyelid: Secondary | ICD-10-CM | POA: Diagnosis not present

## 2021-12-30 DIAGNOSIS — H401131 Primary open-angle glaucoma, bilateral, mild stage: Secondary | ICD-10-CM | POA: Diagnosis not present

## 2021-12-31 ENCOUNTER — Other Ambulatory Visit: Payer: Self-pay | Admitting: Adult Health

## 2021-12-31 DIAGNOSIS — H02833 Dermatochalasis of right eye, unspecified eyelid: Secondary | ICD-10-CM

## 2022-01-02 NOTE — Progress Notes (Signed)
? ? ?Assessment/Plan:  ? ?1.  Parkinsons Disease ? -DaTscan was abnormal November 25, 2021 with decreased radiotracer activity bilaterally, right greater than left. ? -Continue carbidopa/levodopa 25/100, 1 tablet 3 times per day. ? ?2.  B12 deficiency ? -Now on oral supplementation. ? ?3.  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 ? -Patient does have mild evidence of peripheral neuropathy, which could explain the paresthesias of her toes.  This does not, however, explain her gait, nor does Parkinson's disease. ? ?4.  Gait abnormality ? -following with ortho for legs.  This looks like an ortho issue.  Told her I want her to f/u with dr. Alvan Dame as her gait abnormality is not explained by Parkinsons Disease.  She has no back pain.   ? ? ?Subjective:  ? ?Gwendolyn [name] was seen today in follow up for Parkinsons disease.  My previous records were reviewed prior to todays visit as well as outside records available to me.  We started her on levodopa just about a month ago.  She tolerates it well.  Pt denies falls.  Pt denies lightheadedness, near syncope.  No hallucinations.  Mood has been good.  She states that she was told by PT that since she had Parkinsons Disease she didn't need to come back any more since the issues were from Parkinsons Disease.  She has never had her back examined.   ? ?Current prescribed movement disorder medications: ?Carbidopa/levodopa 25/100, 1 tablet 3 times per day. ? ? ?ALLERGIES:   ?Allergies  ?Allergen Reactions  ? Oxycodone-Acetaminophen Other (See Comments)  ?  panic attack  ? Vit C-Cholecalciferol-Rose Hip [Cholecalciferol-Vitamin C] Other (See Comments)  ?  dyspepsia  ? Ibuprofen Hives  ? Macrodantin [Nitrofurantoin Macrocrystal] Rash  ? ? ?CURRENT MEDICATIONS:  ?Current Meds  ?Medication Sig  ? ALPRAZolam (XANAX) 0.5 MG tablet TAKE 1/2 TO 1 (ONE-HALF TO ONE) TABLET BY MOUTH THREE TIMES DAILY AS NEEDED FOR ANXIETY  ? Ascorbic Acid (VITAMIN C) 1000 MG tablet Take 1,000 mg by mouth daily.  ?  aspirin EC 81 MG tablet Take 81 mg by mouth at bedtime.  ? carbidopa-levodopa (SINEMET IR) 25-100 MG tablet Take 1 tablet by mouth 3 (three) times daily. 7am/11am/4pm  ? Cholecalciferol (VITAMIN D) 2000 units CAPS Take 1 capsule by mouth daily.  ? furosemide (LASIX) 40 MG tablet Take 1 tablet (40 mg total) by mouth as needed for edema.  ? losartan-hydrochlorothiazide (HYZAAR) 50-12.5 MG tablet Take  1 tablet  Daily  for BP  ? Naproxen Sodium 220 MG CAPS Take by mouth as needed.   ? Omega-3 Fatty Acids (FISH OIL) 1000 MG CAPS Take by mouth 2 (two) times daily.  ? pregabalin (LYRICA) 150 MG capsule Take 1 capsule    4 x /day      for Chronic Pain  ? rosuvastatin (CRESTOR) 20 MG tablet Take 1 tablet MWF for Cholesterol  ? timolol (TIMOPTIC) 0.5 % ophthalmic solution Place 1 drop into both eyes every morning.   ? triamcinolone ointment (KENALOG) 0.1 % Apply 1 application topically 2 (two) times daily.  ? zinc gluconate 50 MG tablet Take 50 mg by mouth daily.  ? ? ? ?Objective:  ? ?PHYSICAL EXAMINATION:   ? ?VITALS:   ?Vitals:  ? 01/06/22 0806  ?BP: 123/74  ?Pulse: (!) 58  ?SpO2: 98%  ?Weight: 170 lb 3.2 oz (77.2 kg)  ?Height: '5\' 4"'$  (1.626 m)  ? ? ?GEN:  The patient appears stated age and is in NAD. ?HEENT:  Normocephalic, atraumatic.  The mucous membranes are moist. The superficial temporal arteries are without ropiness or tenderness. ?CV:  RRR ?Lungs:  CTAB ?Neck/HEME:  There are no carotid bruits bilaterally. ? ?Neurological examination: ? ?Orientation: The patient is alert and oriented x3. ?Cranial nerves: There is good facial symmetry without facial hypomimia. The speech is fluent and clear. Soft palate rises symmetrically and there is no tongue deviation. Hearing is intact to conversational tone. ?Sensation: Sensation is intact to light touch throughout ?Motor: Strength is at least antigravity x4. ? ?Movement examination: ?Tone: There is min increased tone in the RUE ?Abnormal movements: none even with distraction  in the hands/legs.  Has some mouth tremor ?Coordination:  There is decremation only with hand opening and closing on the R ?Gait and Station: The patient is able to arise without the use of her hands.  Pt has very antalgic gait (knees are varus).  She does not shuffle.  She does not turn en bloc.  She really is not short step. ? ?I have reviewed and interpreted the following labs independently ? ?  Chemistry   ?   ?Component Value Date/Time  ? NA 141 11/06/2021 1102  ? K 4.5 11/06/2021 1102  ? CL 105 11/06/2021 1102  ? CO2 28 11/06/2021 1102  ? BUN 33 (H) 11/06/2021 1102  ? CREATININE 0.93 11/06/2021 1102  ?    ?Component Value Date/Time  ? CALCIUM 9.5 11/06/2021 1102  ? ALKPHOS 87 03/31/2017 0927  ? AST 29 11/06/2021 1102  ? ALT 23 11/06/2021 1102  ? BILITOT 0.5 11/06/2021 1102  ?  ? ? ? ?Lab Results  ?Component Value Date  ? WBC 6.7 11/06/2021  ? HGB 13.1 11/06/2021  ? HCT 40.0 11/06/2021  ? MCV 90.3 11/06/2021  ? PLT 269 11/06/2021  ? ? ?Lab Results  ?Component Value Date  ? TSH 1.39 11/06/2021  ? ? ? ?Cc:  Unk Pinto, MD ? ?

## 2022-01-06 ENCOUNTER — Encounter: Payer: Self-pay | Admitting: Neurology

## 2022-01-06 ENCOUNTER — Ambulatory Visit: Payer: Medicare HMO | Admitting: Neurology

## 2022-01-06 VITALS — BP 123/74 | HR 58 | Ht 64.0 in | Wt 170.2 lb

## 2022-01-06 DIAGNOSIS — G20 Parkinson's disease: Secondary | ICD-10-CM

## 2022-01-06 NOTE — Patient Instructions (Signed)
Make an appointment with Dr. Alvan Dame.  While you have Parkinsons Disease, your gait abnormality is NOT consistent with Parkinsons Disease.  This has to have another etiology. ? ?Local and Online Resources for Power over Parkinson's Group ?May 2023 ? ?LOCAL [place] PARKINSON'S GROUPS  ?Power over Parkinson's Group:   ?Power Over Parkinson's Patient Education Group will be Wednesday, May 10th-*Hybrid meting*- in person at [facility] location and via [facility] at 2:00 pm.   ?Upcoming Power over Parkinson's Meetings:  2nd Wednesdays of the month at 2 pm:  May 10th, June 14th, July 12th ?Contact Amy Marriott at [email or web address] if interested in participating in this group ?Parkinson's Care Partners Group:    3rd Mondays, Contact Misty Paladino ?Atypical Parkinsonian Patient Group:   4th Wednesdays, Contact Misty Paladino ?If you are interested in participating in these groups with Misty, please contact her directly for how to join those meetings.  Her contact information is [email or web address].   ? ?LOCAL EVENTS AND NEW OFFERINGS ?Moving Day [place]:  Saturday, May 6th, 9:30 am at [place], [place], [place]. Participate in Moving Day as a way to ?honor loved ones, raise funds, fight Parkinson's disease, and celebrate movement.?  Register today at [email or web address] ?[place]!  Play Lucky!  Join [place] for home game for a fun evening to bring awareness of Parkinson's and raise funds for our Movement Disorder Funds. Rescheduled to May 11th  6:30 pm [place]. To purchase tickets:  [email or web address] ?Parkinson's T-shirts for sale!  Designed by a local group member, with funds going to [place].  $25.00  Contact Misty to purchase  ?New PWR! Moves [employer] Instructor-Led Class offering at [employer]!  Wednesdays 1-2 pm, starting April 12th.   Contact Bryson Dames,  Acupuncturist at [employer].  [email or web address] [email or web address] ? ?ONLINE EDUCATION AND SUPPORT ?[place]:  www.[email or web address] ?PD Health at Home continues:  Mindfulness Mondays, Wellness Wednesdays, Fitness Fridays  ?Upcoming Education:  ?Understanding Gene and Cell-Based Therapies in Parkinson's.  Wednesday, May 10th at 1:00 pm ?Additional Education offerings virtually through their website-upcoming topics include Palliative Care/Hospice and PD, Sleep and PD ?Register for expert briefings Cytogeneticist) at [email or web address] ?Please check out their website to sign up for emails and see their full online offerings ? ? ?[place]:  [email or web address]  ?Third Thursday Webinars:  On the third Thursday of every month at 12 p.m. ET, join our free live webinars to learn about various aspects of living with Parkinson's disease and our work to speed medical breakthroughs. ?Upcoming Webinar: Get Moving: Exercising for a Healthy Brain.  Thursday, May 18th  at  12 noon. ?Check out additional information on their website to see their full online offerings ? ?[place]:  www.[email or web address] ?Upcoming Webinar:   Stay tuned ?Webinar Series:  Living with Parkinson's Meetup.   Third Thursdays each month, 3 pm ?Care Partner Monthly Meetup.  With Robin Searing [place]inney.  First Tuesday of each month, 2 pm ?Check out additional information to Live Well Today on their website ? ?Parkinson and Movement Disorders (PMD) Alliance:  www.pmdalliance.org ?[employer] Online:  Online Education Events ?Sign up for emails, which are sent weekly to give you updates on programming and online offerings ? ?Parkinson's Association of the [place]:  www.parkinsonassociation.org ?Information on online support groups, education events, and online exercises including Yoga, Parkinson's exercises and more-LOTS of information on links to PD  resources and online events ?Virtual Support Group through [employer] of the [place]; next one is  scheduled for Wednesday, May 3rd at 2 pm. (These are typically scheduled for the 1st Wednesday of the month at 2 pm).  Visit website for details. ?MOVEMENT AND EXERCISE OPPORTUNITIES ?Parkinson's DRUMMING Classes/Music Therapy with Doylene Canning:  This is a returning class and it's FREE!  2nd Mondays, continuing May 8th, 11:00 at the [place].  Contact *Misty Taylor-Paladino at [email or web address] or Doylene Canning at [phone] or [email or web address]  ?PWR! Moves Classes at [place].  Wednesdays 10 and 11 am.   Contact Amy Marriott, PT [email or web address] if interested. ?NEW PWR! Moves Class offering at [employer].  Wednesdays 1-2 pm, starting April 12th.  Contact Bryson Dames, Acupuncturist at [employer].  [email or web address] ?Here is a link to the PWR!Moves classes on Zoom from [place] - Daily Mon-Sat at 10:00. Via Zoom, FREE and open to all.  There is also a link below via Facebook if you use that platform. ? ?[email or web address] ?[email or web address]://[email or web address].no ? ?Parkinson's Wellness Recovery (PWR! Moves)  www.[email or web address] ?Info on the PWR! Virtual Experience:  You will have access to our expertise through self-assessment, guided plans that start with the PD-specific fundamentals, educational content, tips, Q&A with an expert, and a growing Art therapist of PD-specific pre-recorded and live exercise classes of varying types and intensity - both physical and cognitive! If that is not enough, we offer 1:1 wellness consultations (in-person or virtual) to personalize your PWR! Research scientist (medical).  ?[employer] Fridays:  ?As part of the PD Health @ Home program, this free video series focuses each week on one aspect of fitness designed to support people living with Parkinson's.  These weekly videos highlight the [place] recent fitness guidelines for people with Parkinson's disease. ?www.KVTVnet.com.cy ?Dance for PD website is offering free, live-stream classes throughout the week, as well as links to [employer] of classes:  https://danceforparkinsons.org/ ?Virtual dance and Pilates for Parkinson's classes: Click on the Community Tab> Parkinson's Movement Initiative Tab.  To register for classes and for more information, visit www.[email or web address].co.za and click the ?community? tab.  ?[employer] Parkinson's Cycling Classes  ?[employer]:  Thursdays @ Noon-Live classes at [employer] ([employer] at [email or web address] or [phone]) ?[facility] [employer] [employer]: Virtual Classes Mondays and Thursdays Jeanette Caprice classes Tuesday, Wednesday and Thursday (contact [place] at [email or web address]  or [phone]) ?[employer] ?Varied levels of classes are offered Mondays, Tuesdays and Thursdays at [employer].  ?Stretching with Verdis Frederickson weekly class is also offered for people with Parkinson's ?To observe a class or for more information, call [phone] or email Hezzie Bump at [email or web address] ?ADDITIONAL SUPPORT AND RESOURCES ?[employer]:Online Caregiver Education Opportunities:  www.well-springsolutions.org/caregiver-education/caregiver-support-group.  You may also contact Vickki Muff at [email or web address] or [phone].    ?[employer] Navigator:  Just1Navigator program, a free service to help individuals and families through the journey of determining care for older adults.  The ?Navigator? is a [phone], Education officer, museum, who will speak with a prospective client  and/or loved ones to provide an assessment of the situation and a set of recommendations for a personalized care plan -- all free of charge, and whether [employer] offers the needed service or not. If the n

## 2022-01-19 ENCOUNTER — Ambulatory Visit (INDEPENDENT_AMBULATORY_CARE_PROVIDER_SITE_OTHER): Payer: Medicare HMO | Admitting: Licensed Clinical Social Worker

## 2022-01-19 DIAGNOSIS — F4321 Adjustment disorder with depressed mood: Secondary | ICD-10-CM | POA: Diagnosis not present

## 2022-01-19 NOTE — BH Specialist Note (Signed)
Integrated Behavioral Health Follow Up In-Person Visit  MRN: [id number] Name: Gwendolyn [name]  Number of [place] Clinician visits: No data recorded Session Start time: [id number]   Session End time: 0945  Total time in minutes: 55   Types of Service: Individual psychotherapy  Interpretor:No. Interpretor Name and Language: NA   Subjective: Gwendolyn [name] is a 81 y.o. female accompanied by  Self Patient was referred by Dr. Wells Guiles Tat for Adjustment with Parkinson's DX and being a caregiver . Patient reports the following symptoms/concerns: Feelings of being overwhelmed with Dx and being a caregiver for one with Parkinson's .  Feelings of stress, and depressed mood also associated with recent diagnosis and caring for someone with Parkinson's  Duration of problem: Several weeks ; Severity of problem: moderate  Objective: Mood: Depressed and Stressed  and Affect: Appropriate Risk of harm to self or others: No plan to harm self or others  Life Context: Family and Social: PT resides in her home and takes care of close friend that have Parkinson's  School/Work: Pt is a retired Therapist, sports: Pt is able to complete ADL's and also is a caregiver  Life Changes: major like transition is being diagnosed with Parkinson's and also providing care for someone with Parkinson's   Patient and/or Family's Strengths/Protective Factors: Social connections and Concrete supports in place (healthy food, safe environments, etc.)  Goals Addressed: Patient will:  Reduce symptoms of: anxiety, depression, and stress   Increase knowledge and/or ability of: coping skills, healthy habits, self-management skills, and stress reduction   Demonstrate ability to: Increase healthy adjustment to current life circumstances and Increase adequate support systems for patient/family  Progress towards Goals: Ongoing  Interventions: Interventions utilized:  Solution-Focused Strategies and  CBT Cognitive Behavioral Therapy Standardized Assessments completed: Not Needed  Patient and/or Family Response: Pt is open to interventions provided .  LCSW prompted Pt again about resources and options to help  Pt with her Parkinson's and caregiver options as well.   Patient Centered Plan: Patient is on the following Treatment Plan(s): Maximize the use of formal and informal support systems structures, look at provisions of care for the person she is caring for with respite ([facility]) In home care or assisted living.  To have open communication with POA of person she is caring for and person as well.  Write/state priorities of what provides enjoyment fulfillment for caregiver and engage in those activities.  Education on Parkinson's and continue to look at exercise and support group options in the community.   Assessment: Patient currently experiencing Life Role Transition of being diagnosed with Parkinson's and also being a caregiver of someone with Parkinson's .   Patient may benefit from Maximize the use of formal and informal support systems structures, look at provisions of care for the person she is caring for with respite ([facility]) In home care or assisted living.  To have open communication with POA of person she is caring for and person as well.  Write/state priorities of what provides enjoyment fulfillment for caregiver and engage in those activities.  Education on Parkinson's and continue to look at exercise and support group options in the community. .  Plan: Follow up with behavioral health clinician on : In three to four week  Behavioral recommendations: Stated above and resources provided  Referral(s): Community Resources:  Exercise Options/Support Group and [facility] resp[facility]  "From scale of 1-10, how likely are you to follow plan?": 8  Gwendolyn Parsley A Taylor-Paladino, LCSW

## 2022-02-02 ENCOUNTER — Other Ambulatory Visit: Payer: Self-pay | Admitting: Internal Medicine

## 2022-02-02 DIAGNOSIS — I10 Essential (primary) hypertension: Secondary | ICD-10-CM

## 2022-02-11 NOTE — Progress Notes (Signed)
MEDICARE ANNUAL WELLNESS VISIT AND FOLLOW UP  Assessment:   [name] was seen today for follow-up and medicare wellness.  Diagnoses and all orders for this visit:  Encounter for Medicare annual wellness exam Due annually  Health maintenance reviewed  Essential hypertension Continue current medications Has Lasix 68m, takes once a week or every other weeks. Cramps improved with magnesium  Monitor blood pressure at home; call if consistently over 130/80 Continue DASH diet.   Reminder to go to the ER if any CP, SOB, nausea, dizziness, severe HA, changes vision/speech, left arm numbness and tingling and jaw pain. -     CBC with Differential/Platelet -     COMPLETE METABOLIC PANEL WITH GFR -     Magnesium   [place]'s [place] Dr. TCarles Colletis managing, on sinemet IR Encourage regular exercise/balance program  Patient denies needs at this time  Hyperlipidemia, mixed Continue medications: rosuvastatin 241mthree times a week Discussed dietary and exercise modifications Low fat diet -     Lipid panel  Abnormal glucose Discussed dietary and exercise modifications  Overweight (BMI 25.0-29.9) - weights trending up  - Discussed dietary and exercise modifications - discussed intentional eating/emotional eating strategies - discussed exercise guidelines  Generalized anxiety disorder Doing well on current regimen, rare xanax use, monitor PDMP with refill requests Discussed stress management techniques   Discussed good sleep hygiene Discussed increasing physical activity and exercise Increase water intake  Open-angle glaucoma, unspecified glaucoma stage, unspecified laterality, unspecified open-angle glaucoma type Continue drops as prescribed Yearly eye exams Doing well at this time  Cervical arthritis/radicular pain Doing well at this time Lyrica helping with this Follow with with as needed  Vitamin D deficiency Continue supplementation to maintain goal of 60-100 Check  levels  Medication management Continued  Numbness in toes Mild, exam with some toe callus- care discussed Had normal labs consider lumbar MRI, NCV - has upcoming OV with ortho, also est with neuro, encouraged to discuss  Bil cerumen impaction - stop using Qtips, irrigation used in the office without complications, use OTC drops/oil at home to prevent reoccurence  Need for tetanus booster - Td administered without complication.   Orders Placed This Encounter  Procedures   CBC with Differential/Platelet   COMPLETE METABOLIC PANEL WITH GFR   Lipid panel   TSH   Magnesium      Over 30 minutes of face to face exam, counseling, chart review, and critical decision making was performed Future Appointments  Date Time Provider [place]/22/2023 10:30 AM McUnk PintoMD [facility] None  06/09/2022  8:45 AM Tat, ReEustace QuailDO [facility] None  11/12/2022 11:00 AM McUnk PintoMD [facility] None  02/15/2023  9:00 AM WiAlycia RossettiNP [facility] None    Plan:   During the course of the visit the patient was educated and counseled about appropriate screening and preventive services including:   Pneumococcal vaccine  Influenza vaccine Td vaccine Prevnar 13 Screening electrocardiogram Screening mammography Bone densitometry screening Colorectal cancer screening Diabetes screening Glaucoma screening Nutrition counseling  Advanced directives: given info/requested copies   Subjective:   Gwendolyn [name] is a [age].o. female who presents for Medicare Annual Wellness Visit and 3 month follow up on hypertension, prediabetes, hyperlipidemia, vitamin D def.  She reports in the last month has noticed poor hearing out of R ear, hx of wax, not improving with ear irrigation attempts, also notes some continuous low tone tinnitis. Denies pain, dizziness. _0  She has hx of anxiety,  has been prescribed xanax though improved.   She reports last use was last week, will  go months without needing.   Cervical arthritis/radicular pain, managing fairly with lifestyle modification (sleeping on back, flatter pillow), using aleve PRN, lyrica.    She reports having numb toes, burning in left calf/leg pain, having some difficulty walking due to this, has discussed with est ortho Dr. Alvan Dame, did PT without improvement, has OV next week to discuss further.   She was evaluated by Dr. Carles Collet due to change in gait, pill rolling, was formally dx with Parkinson's started on sinemet IR TID, patient hasn't noted particular changes with this but reports minimal sx to begin with, managing well.   BMI is Body mass index is 29.39 kg/m., she has been working on diet and exercise, generally active, admits to emotional eating, eating too many cookies.  Wt Readings from Last 3 Encounters:  02/12/22 171 lb 3.2 oz (77.7 kg)  01/06/22 170 lb 3.2 oz (77.2 kg)  11/28/21 166 lb 6.4 oz (75.5 kg)   Her blood pressure has been controlled at home, today their BP is BP: 122/70  She does not workout. She denies chest pain, shortness of breath, dizziness. She takes lasix AS needed for swelling.    She is on cholesterol medication and has myalgias with crestor (20 mg three times a week) on the days she takes it. Her cholesterol is at goal. The cholesterol last visit was:   Lab Results  Component Value Date   CHOL 145 11/06/2021   HDL 66 11/06/2021   LDLCALC 65 11/06/2021   TRIG 63 11/06/2021   CHOLHDL 2.2 11/06/2021    She has been working on diet and exercise for glucose management, and denies paresthesia of the feet, polydipsia, polyuria and visual disturbances. Last A1C in the office was:  Lab Results  Component Value Date   HGBA1C 5.8 (H) 11/06/2021   Lab Results  Component Value Date   EGFR 63 11/06/2021   Patient is on Vitamin D supplement.   Lab Results  Component Value Date   VD25OH 71 11/06/2021       Medication Review Current Outpatient Medications on File Prior to Visit   Medication Sig   ALPRAZolam (XANAX) 0.5 MG tablet TAKE 1/2 TO 1 (ONE-HALF TO ONE) TABLET BY MOUTH THREE TIMES DAILY AS NEEDED FOR ANXIETY   Ascorbic Acid (VITAMIN C) 1000 MG tablet Take 1,000 mg by mouth daily.   aspirin EC 81 MG tablet Take 81 mg by mouth at bedtime.   carbidopa-levodopa (SINEMET IR) 25-100 MG tablet Take 1 tablet by mouth 3 (three) times daily. 7am/11am/4pm   CHELATED MAGNESIUM PO Take 1 tablet by mouth 2 (two) times daily.   Cholecalciferol (VITAMIN D) 2000 units CAPS Take 1 capsule by mouth daily.   furosemide (LASIX) 40 MG tablet Take 1 tablet (40 mg total) by mouth as needed for edema.   losartan-hydrochlorothiazide (HYZAAR) 50-12.5 MG tablet TAKE ONE TABLET BY MOUTH DAILY FOR BLOOD PRESSURE   Naproxen Sodium 220 MG CAPS Take by mouth as needed.    Omega-3 Fatty Acids (FISH OIL) 1000 MG CAPS Take by mouth 2 (two) times daily.   pregabalin (LYRICA) 150 MG capsule Take 1 capsule    4 x /day      for Chronic Pain   rosuvastatin (CRESTOR) 20 MG tablet Take 1 tablet MWF for Cholesterol   timolol (TIMOPTIC) 0.5 % ophthalmic solution Place 1 drop into both eyes every morning.  triamcinolone ointment (KENALOG) 0.1 % Apply 1 application topically 2 (two) times daily.   zinc gluconate 50 MG tablet Take 50 mg by mouth daily.   No current facility-administered medications on file prior to visit.    Current Problems (verified) Patient Active Problem List   Diagnosis Date Noted   Parkinson's disease ([place]) 11/28/2021   B12 deficiency 05/28/2021   Cervical arthritis 09/27/2017   Open-angle glaucoma 05/14/2016   Generalized anxiety disorder 02/11/2016   Overweight (BMI 25.0-29.9) 04/25/2015   Medication management 02/12/2014   Essential hypertension 08/08/2013   Hyperlipidemia, mixed 08/08/2013   Abnormal glucose 08/08/2013   Vitamin D deficiency 08/08/2013    Screening Tests Immunization History  Administered Date(s) Administered   Fluad Quad(high Dose 65+)  05/23/2021   Influenza, High Dose Seasonal PF 06/06/2014, 05/14/2016, 06/24/2018, 06/05/2019, 06/13/2020   Influenza-Unspecified 06/13/2013, 05/02/2015, 06/24/2018   Moderna Sars-Covid-2 Vaccination 09/13/2019, 10/11/2019, 07/01/2020, 01/17/2021   Pneumococcal Conjugate-13 05/24/2014   Pneumococcal Polysaccharide-23 01/08/2009   Tdap 01/28/2012   Zoster, Live 08/31/2006   Health Maintenance  Topic Date Due   Zoster Vaccines- Shingrix (1 of 2) Never done   TETANUS/TDAP  01/27/2022   COVID-19 Vaccine (5 - Booster for Moderna series) 02/28/2022 (Originally 03/14/2021)   INFLUENZA VACCINE  03/31/2022   MAMMOGRAM  12/17/2022   Pneumonia Vaccine 70+ Years old  Completed   DEXA SCAN  Completed   HPV VACCINES  Aged Out   Last colonoscopy: 2018, benign polyps, follow up PRN only per Dr. Havery Moros Cologuard 2018 (positive)  Last mammogram: annually at the breast center, last 11/2020 Last pap smear/pelvic exam: 2019 Dr. Ulanda Edison, declines another  DEXA: 12/2020 normal, T -0.8  TD or Tdap: 2013, TODAY- works in garden with frequent cuts Shringrix: Discussed with patient, plans to get the pharmacy Covid 19: 2/2 + booster 12/2020  Names of Other Physician/Practitioners you currently use: 1. [facility]- here for primary care 2. Dr. Presley Raddle opht, eye doctor, last visit 12/2021, low pressure glaucoma 3. Dr. Geanie Cooley, dentist, last visit 12/2021, q68m Patient Care Team: MUnk Pinto MD as PCP - General (Internal Medicine) BLafayette Dragon MD (Inactive) as Consulting Physician (Gastroenterology) HNewton Pigg MD as Consulting Physician (Obstetrics and Gynecology)   Allergies Allergies  Allergen Reactions   Oxycodone-Acetaminophen Other (See Comments)    panic attack   Vit C-Cholecalciferol-Rose Hip [Cholecalciferol-Vitamin C] Other (See Comments)    dyspepsia   Ibuprofen Hives   Macrodantin [Nitrofurantoin Macrocrystal] Rash    SURGICAL  HISTORY She  has a past surgical history that includes right shoulder humerus fx (july 2003); right hand ulner nerve surgery (March 26, 2002); right elbow   surgery (March 26, 2002); right wrist plating (2004); right total hip arthroplasty (2008); right total knee arthroplasty (sept 2012); Tonsillectomy (age 81; Total hip arthroplasty (10/22/2011); Colonoscopy; and Dilation and curettage of uterus. FAMILY HISTORY Her family history includes Atrial fibrillation in her brother, brother, and mother; Breast cancer in her maternal aunt, maternal grandmother, and paternal aunt; Congestive Heart Failure in her mother; Diabetes in her father and mother; Heart attack (age of onset: 424 in her father; Heart disease in her father and mother; Hypertension in her mother; Tremor in her father and paternal grandmother. SOCIAL HISTORY She  reports that she has never smoked. She has never used smokeless tobacco. She reports current alcohol use. She reports that she does not use drugs.  MEDICARE WELLNESS OBJECTIVES: Physical activity: Current Exercise Habits: Home exercise routine, Type of  exercise: walking, Time (Minutes): 60, Frequency (Times/Week): 2, Weekly Exercise (Minutes/Week): 120, Intensity: Mild, Exercise limited by: None identified Cardiac risk factors: Cardiac Risk Factors include: advanced age (>70mn, >>2women);dyslipidemia;hypertension;sedentary lifestyle Depression/mood screen:      02/12/2022    9:51 AM  Depression screen PHQ 2/9  Decreased Interest 0  Down, Depressed, Hopeless 1  PHQ - 2 Score 1    ADLs:     02/12/2022    9:48 AM 11/08/2021    8:10 PM  In your present state of health, do you have any difficulty performing the following activities:  Hearing? 1 0  Comment in the last month   Vision? 0 0  Difficulty concentrating or making decisions? 0 0  Walking or climbing stairs? 0 0  Dressing or bathing? 0 0  Doing errands, shopping? 0 0    Cognitive Testing  Alert? Yes  Normal  Appearance?Yes  Oriented to person? Yes  Place? Yes   Time? Yes  Recall of three objects?  Yes  Can perform simple calculations? Yes  Displays appropriate judgment?Yes  Can read the correct time from a watch face?Yes  EOL planning: Does Patient Have a Medical Advance Directive?: Yes Type of Advance Directive: Healthcare Power of Attorney, Living will Does patient want to make changes to medical advance directive?: No - Patient declined Copy of HAlpinein Chart?: No - copy requested   Objective:   Today's Vitals   02/12/22 0926  BP: 122/70  Pulse: 65  Temp: (!) 96.8 F (36 C)  SpO2: 97%  Weight: 171 lb 3.2 oz (77.7 kg)     Body mass index is 29.39 kg/m.  General Appearance: Well nourished, in no apparent distress. Eyes: PERRLA, EOMs, conjunctiva no swelling or erythema Sinuses: No Frontal/maxillary tenderness ENT/Mouth: Ext aud canals bil obstructed by wax. No erythema, swelling, or exudate on post pharynx.  Tonsils not swollen or erythematous. Mildly HOH.  Neck: Supple, thyroid normal.  Respiratory: Respiratory effort normal, BS equal bilaterally without rales, rhonchi, wheezing or stridor.  Cardio: RRR with no MRGs. Brisk peripheral pulses without edema.  Abdomen: Soft, + BS.  Non tender, no guarding, rebound, hernias, masses. Lymphatics: Non tender without lymphadenopathy.  Musculoskeletal: Full ROM, 5/5 strength, mild valgus of left knee, mildly antalgic/unsteady gait. No effusion, laxity. Neg straight leg raise.  Skin: Warm, dry without rashes, lesions, ecchymosis. Left toenails thickened (reports did lamisil, declines further). Some callus of left toes.  Neuro: Cranial nerves intact. No cerebellar symptoms. Mildly decreased sensation to monofilament toe tips, otherwise intact. No tremor, cogwheeling.  Psych: Awake and oriented X 3, normal affect, Insight and Judgment appropriate.    Medicare Attestation I have personally reviewed: The patient's  medical and social history Their use of alcohol, tobacco or illicit drugs Their current medications and supplements The patient's functional ability including ADLs,fall risks, home safety risks, cognitive, and hearing and visual impairment Diet and physical activities Evidence for depression or mood disorders  The patient's weight, height, BMI, and visual acuity have been recorded in the chart.  I have made referrals, counseling, and provided education to the patient based on review of the above and I have provided the patient with a written personalized care plan for preventive services.     AIzora Ribas NP   02/12/2022

## 2022-02-12 ENCOUNTER — Encounter: Payer: Self-pay | Admitting: Adult Health

## 2022-02-12 ENCOUNTER — Ambulatory Visit (INDEPENDENT_AMBULATORY_CARE_PROVIDER_SITE_OTHER): Payer: Medicare HMO | Admitting: Adult Health

## 2022-02-12 VITALS — BP 122/70 | HR 65 | Temp 96.8°F | Wt 171.2 lb

## 2022-02-12 DIAGNOSIS — F411 Generalized anxiety disorder: Secondary | ICD-10-CM

## 2022-02-12 DIAGNOSIS — Z0001 Encounter for general adult medical examination with abnormal findings: Secondary | ICD-10-CM

## 2022-02-12 DIAGNOSIS — Z79899 Other long term (current) drug therapy: Secondary | ICD-10-CM | POA: Diagnosis not present

## 2022-02-12 DIAGNOSIS — E782 Mixed hyperlipidemia: Secondary | ICD-10-CM | POA: Diagnosis not present

## 2022-02-12 DIAGNOSIS — R200 Anesthesia of skin: Secondary | ICD-10-CM

## 2022-02-12 DIAGNOSIS — R6889 Other general symptoms and signs: Secondary | ICD-10-CM | POA: Diagnosis not present

## 2022-02-12 DIAGNOSIS — H6123 Impacted cerumen, bilateral: Secondary | ICD-10-CM | POA: Diagnosis not present

## 2022-02-12 DIAGNOSIS — E663 Overweight: Secondary | ICD-10-CM

## 2022-02-12 DIAGNOSIS — H4010X0 Unspecified open-angle glaucoma, stage unspecified: Secondary | ICD-10-CM

## 2022-02-12 DIAGNOSIS — M47812 Spondylosis without myelopathy or radiculopathy, cervical region: Secondary | ICD-10-CM

## 2022-02-12 DIAGNOSIS — Z23 Encounter for immunization: Secondary | ICD-10-CM | POA: Diagnosis not present

## 2022-02-12 DIAGNOSIS — R7309 Other abnormal glucose: Secondary | ICD-10-CM

## 2022-02-12 DIAGNOSIS — Z0000 Encounter for general adult medical examination without abnormal findings: Secondary | ICD-10-CM

## 2022-02-12 DIAGNOSIS — G20 Parkinson's disease: Secondary | ICD-10-CM

## 2022-02-12 DIAGNOSIS — I10 Essential (primary) hypertension: Secondary | ICD-10-CM

## 2022-02-12 DIAGNOSIS — E559 Vitamin D deficiency, unspecified: Secondary | ICD-10-CM

## 2022-02-12 DIAGNOSIS — G20A1 Parkinson's disease without dyskinesia, without mention of fluctuations: Secondary | ICD-10-CM

## 2022-02-12 NOTE — Patient Instructions (Addendum)
Gwendolyn [name] , Thank you for taking time to come for your Medicare Wellness Visit. I appreciate your ongoing commitment to your health goals. Please review the following plan we discussed and let me know if I can assist you in the future.   These are the goals we discussed:  Goals      Exercise 150 min/wk Moderate Activity     Weight (lb) < 150 lb (68 kg)        This is a list of the screening recommended for you and due dates:  Health Maintenance  Topic Date Due   Zoster (Shingles) Vaccine (1 of 2) Never done   Tetanus Vaccine  01/27/2022   COVID-19 Vaccine (5 - Booster for Moderna series) 02/28/2022*   Flu Shot  03/31/2022   Mammogram  12/17/2022   Pneumonia Vaccine  Completed   DEXA scan (bone density measurement)  Completed   HPV Vaccine  Aged Out  *Topic was postponed. The date shown is not the original due date.     Use a dropper or use a cap to put peroxide, olive oil,mineral oil or canola oil in the effected ear- 2-3 times a week. Let it soak for 20-30 min then you can take a shower or use a baby bulb with warm water to wash out the ear wax.  Can buy debrox wax removal kit over the counter.  Do not use Qtips    Zoster Vaccine, Recombinant injection What is this medication? ZOSTER VACCINE (ZOS ter vak SEEN) is a vaccine used to reduce the risk of getting shingles. This vaccine is not used to treat shingles or nerve pain from shingles. This medicine may be used for other purposes; ask your health care provider or pharmacist if you have questions. COMMON BRAND NAME(S): [facility] What should I tell my care team before I take this medication? They need to know if you have any of these conditions: cancer immune system problems an unusual or allergic reaction to Zoster vaccine, other medications, foods, dyes, or preservatives pregnant or trying to get pregnant breast-feeding How should I use this medication? This vaccine is injected into a muscle. It is given by a  health care provider. A copy of Vaccine Information Statements will be given before each vaccination. Be sure to read this information carefully each time. This sheet may change often. Talk to your health care provider about the use of this vaccine in children. This vaccine is not approved for use in children. Overdosage: If you think you have taken too much of this medicine contact a poison control center or emergency room at once. NOTE: This medicine is only for you. Do not share this medicine with others. What if I miss a dose? Keep appointments for follow-up (booster) doses. It is important not to miss your dose. Call your health care provider if you are unable to keep an appointment. What may interact with this medication? medicines that suppress your immune system medicines to treat cancer steroid medicines like prednisone or cortisone This list may not describe all possible interactions. Give your health care provider a list of all the medicines, herbs, non-prescription drugs, or dietary supplements you use. Also tell them if you smoke, drink alcohol, or use illegal drugs. Some items may interact with your medicine. What should I watch for while using this medication? Visit your health care provider regularly. This vaccine, like all vaccines, may not fully protect everyone. What side effects may I notice from receiving this medication? Side effects  that you should report to your doctor or health care professional as soon as possible: allergic reactions (skin rash, itching or hives; swelling of the face, lips, or tongue) trouble breathing Side effects that usually do not require medical attention (report these to your doctor or health care professional if they continue or are bothersome): chills headache fever nausea pain, redness, or irritation at site where injected tiredness vomiting This list may not describe all possible side effects. Call your doctor for medical advice about  side effects. You may report side effects to FDA at 1-800-FDA-1088. Where should I keep my medication? This vaccine is only given by a health care provider. It will not be stored at home. NOTE: This sheet is a summary. It may not cover all possible information. If you have questions about this medicine, talk to your doctor, pharmacist, or health care provider.  2023 Elsevier/Gold Standard (2021-07-18 00:00:00)

## 2022-02-12 NOTE — Addendum Note (Signed)
Addended by: Chancy Hurter on: 02/12/2022 11:26 AM   Modules accepted: Orders

## 2022-02-13 LAB — CBC WITH DIFFERENTIAL/PLATELET
Absolute Monocytes: 767 cells/uL (ref 200–950)
Basophils Absolute: 51 cells/uL (ref 0–200)
Basophils Relative: 0.7 %
Eosinophils Absolute: 460 cells/uL (ref 15–500)
Eosinophils Relative: 6.3 %
HCT: 37.1 % (ref 35.0–45.0)
Hemoglobin: 12.5 g/dL (ref 11.7–15.5)
Lymphs Abs: 1898 cells/uL (ref 850–3900)
MCH: 30.5 pg (ref 27.0–33.0)
MCHC: 33.7 g/dL (ref 32.0–36.0)
MCV: 90.5 fL (ref 80.0–100.0)
MPV: 12 fL (ref 7.5–12.5)
Monocytes Relative: 10.5 %
Neutro Abs: 4125 cells/uL (ref 1500–7800)
Neutrophils Relative %: 56.5 %
Platelets: 259 10*3/uL (ref 140–400)
RBC: 4.1 10*6/uL (ref 3.80–5.10)
RDW: 13 % (ref 11.0–15.0)
Total Lymphocyte: 26 %
WBC: 7.3 10*3/uL (ref 3.8–10.8)

## 2022-02-13 LAB — LIPID PANEL
Cholesterol: 160 mg/dL (ref <200)
HDL: 65 mg/dL (ref >=50)
LDL Cholesterol (Calc): 79 mg/dL (calc)
Non-HDL Cholesterol (Calc): 95 mg/dL (calc) (ref <130)
Total CHOL/HDL Ratio: 2.5 (calc) (ref <5.0)
Triglycerides: 78 mg/dL (ref <150)

## 2022-02-13 LAB — COMPLETE METABOLIC PANEL WITH GFR
AG Ratio: 1.6 (calc) (ref 1.0–2.5)
ALT: 6 U/L (ref 6–29)
AST: 24 U/L (ref 10–35)
Albumin: 4 g/dL (ref 3.6–5.1)
Alkaline phosphatase (APISO): 90 U/L (ref 37–153)
BUN/Creatinine Ratio: 36 (calc) — ABNORMAL HIGH (ref 6–22)
BUN: 34 mg/dL — ABNORMAL HIGH (ref 7–25)
CO2: 28 mmol/L (ref 20–32)
Calcium: 9.3 mg/dL (ref 8.6–10.4)
Chloride: 105 mmol/L (ref 98–110)
Creat: 0.94 mg/dL (ref 0.60–0.95)
Globulin: 2.5 g/dL (calc) (ref 1.9–3.7)
Glucose, Bld: 84 mg/dL (ref 65–99)
Potassium: 4.5 mmol/L (ref 3.5–5.3)
Sodium: 142 mmol/L (ref 135–146)
Total Bilirubin: 0.5 mg/dL (ref 0.2–1.2)
Total Protein: 6.5 g/dL (ref 6.1–8.1)
eGFR: 61 mL/min/{1.73_m2} (ref >=60)

## 2022-02-13 LAB — TSH: TSH: 1.09 mIU/L (ref 0.40–4.50)

## 2022-02-13 LAB — MAGNESIUM: Magnesium: 2.3 mg/dL (ref 1.5–2.5)

## 2022-02-18 DIAGNOSIS — M1712 Unilateral primary osteoarthritis, left knee: Secondary | ICD-10-CM | POA: Diagnosis not present

## 2022-02-18 DIAGNOSIS — M25562 Pain in left knee: Secondary | ICD-10-CM | POA: Diagnosis not present

## 2022-02-24 ENCOUNTER — Other Ambulatory Visit: Payer: Self-pay | Admitting: Internal Medicine

## 2022-02-24 DIAGNOSIS — G90519 Complex regional pain syndrome I of unspecified upper limb: Secondary | ICD-10-CM

## 2022-03-17 DIAGNOSIS — H0279 Other degenerative disorders of eyelid and periocular area: Secondary | ICD-10-CM | POA: Diagnosis not present

## 2022-03-17 DIAGNOSIS — H57813 Brow ptosis, bilateral: Secondary | ICD-10-CM | POA: Diagnosis not present

## 2022-03-17 DIAGNOSIS — H02423 Myogenic ptosis of bilateral eyelids: Secondary | ICD-10-CM | POA: Diagnosis not present

## 2022-03-17 DIAGNOSIS — H02834 Dermatochalasis of left upper eyelid: Secondary | ICD-10-CM | POA: Diagnosis not present

## 2022-03-17 DIAGNOSIS — H04123 Dry eye syndrome of bilateral lacrimal glands: Secondary | ICD-10-CM | POA: Diagnosis not present

## 2022-03-17 DIAGNOSIS — H02413 Mechanical ptosis of bilateral eyelids: Secondary | ICD-10-CM | POA: Diagnosis not present

## 2022-03-17 DIAGNOSIS — H02831 Dermatochalasis of right upper eyelid: Secondary | ICD-10-CM | POA: Diagnosis not present

## 2022-03-17 DIAGNOSIS — H53483 Generalized contraction of visual field, bilateral: Secondary | ICD-10-CM | POA: Diagnosis not present

## 2022-04-14 NOTE — Progress Notes (Addendum)
Anesthesia Review:  PCP: Caryl Pina corbett, Np - LOV 02/12/22.  DR Melford Aase- PCP Clearance on chart dated 02/24/22.  Cardiologist : Neuro- [place] Ouch Tot   Chest x-ray : EKG : 11/06/21  Echo : Stress test: Cardiac Cath :  Activity level:  Sleep Study/ CPAP : Fasting Blood Sugar :      / Checks Blood Sugar -- times a day:   Blood Thinner/ Instructions /Last Dose: ASA / Instructions/ Last Dose :  81 mg Aspirin

## 2022-04-15 NOTE — Patient Instructions (Signed)
SURGICAL WAITING ROOM VISITATION Patients having surgery or a procedure may have no more than 2 support people in the waiting area - these visitors may rotate.   Children under the age of 30 must have an adult with them who is not the patient. If the patient needs to stay at the hospital during part of their recovery, the visitor guidelines for inpatient rooms apply. Pre-op nurse will coordinate an appropriate time for 1 support person to accompany patient in pre-op.  This support person may not rotate.    Please refer to the [facility] website for the visitor guidelines for Inpatients (after your surgery is over and you are in a regular room).       Your procedure is scheduled on:  04/28/22    Report to [facility] Main Entrance    Report to admitting at   1015AM   Call this number if you have problems the morning of surgery [phone]   Do not eat food :After Midnight.   After Midnight you may have the following liquids until ___ 0945___ AM  DAY OF SURGERY  Water Non-Citrus Juices (without pulp, NO RED) Carbonated Beverages Black Coffee (NO MILK/CREAM OR CREAMERS, sugar ok)  Clear Tea (NO MILK/CREAM OR CREAMERS, sugar ok) regular and decaf                             Plain Jell-O (NO RED)                                           Fruit ices (not with fruit pulp, NO RED)                                     Popsicles (NO RED)                                                               Sports drinks like Gatorade (NO RED)                     The day of surgery:  Drink ONE (1) Pre-Surgery Clear Ensure or G2 at 0945  AM  ( have completed by ) the morning of surgery. Drink in one sitting. Do not sip.  This drink was given to you during your hospital  pre-op appointment visit. Nothing else to drink after completing the  Pre-Surgery Clear Ensure or G2.          Oral Hygiene is also important to reduce your risk of infection.                                     Remember - BRUSH YOUR TEETH THE MORNING OF SURGERY WITH YOUR REGULAR TOOTHPASTE   Do NOT smoke after Midnight   Take these medicines the morning of surgery with A SIP OF WATER:  sinemet, eye drops as usual   DO NOT TAKE ANY ORAL DIABETIC MEDICATIONS DAY OF YOUR SURGERY  Bring CPAP mask and tubing day of surgery.                              You may not have any metal on your body including hair pins, jewelry, and body piercing             Do not wear make-up, lotions, powders, perfumes/cologne, or deodorant  Do not wear nail polish including gel and S&S, artificial/acrylic nails, or any other type of covering on natural nails including finger and toenails. If you have artificial nails, gel coating, etc. that needs to be removed by a nail salon please have this removed prior to surgery or surgery may need to be canceled/ delayed if the surgeon/ anesthesia feels like they are unable to be safely monitored.   Do not shave  48 hours prior to surgery.               Men may shave face and neck.   Do not bring valuables to the hospital. [place].   Contacts, dentures or bridgework may not be worn into surgery.   Bring small overnight bag day of surgery.   DO NOT [place]. PHARMACY WILL DISPENSE MEDICATIONS LISTED ON YOUR MEDICATION LIST TO YOU DURING YOUR ADMISSION [place]!    Patients discharged on the day of surgery will not be allowed to drive home.  Someone NEEDS to stay with you for the first 24 hours after anesthesia.   Special Instructions: Bring a copy of your healthcare power of attorney and living will documents         the day of surgery if you haven't scanned them before.              Please read over the following fact sheets you were given: IF YOU HAVE QUESTIONS ABOUT YOUR PRE-OP INSTRUCTIONS PLEASE CALL [phone]     [facility] - Preparing for Surgery Before surgery, you can  play an important role.  Because skin is not sterile, your skin needs to be as free of germs as possible.  You can reduce the number of germs on your skin by washing with CHG (chlorahexidine gluconate) soap before surgery.  CHG is an antiseptic cleaner which kills germs and bonds with the skin to continue killing germs even after washing. Please DO NOT use if you have an allergy to CHG or antibacterial soaps.  If your skin becomes reddened/irritated stop using the CHG and inform your nurse when you arrive at Short Stay. Do not shave (including legs and underarms) for at least 48 hours prior to the first CHG shower.  You may shave your face/neck. Please follow these instructions carefully:  1.  Shower with CHG Soap the night before surgery and the  morning of Surgery.  2.  If you choose to wash your hair, wash your hair first as usual with your  normal  shampoo.  3.  After you shampoo, rinse your hair and body thoroughly to remove the  shampoo.                           4.  Use CHG as you would any other liquid soap.  You can apply chg directly  to the skin and wash  Gently with a scrungie or clean washcloth.  5.  Apply the CHG Soap to your body ONLY FROM THE NECK DOWN.   Do not use on face/ open                           Wound or open sores. Avoid contact with eyes, ears mouth and genitals (private parts).                       Wash face,  Genitals (private parts) with your normal soap.             6.  Wash thoroughly, paying special attention to the area where your surgery  will be performed.  7.  Thoroughly rinse your body with warm water from the neck down.  8.  DO NOT shower/wash with your normal soap after using and rinsing off  the CHG Soap.                9.  Pat yourself dry with a clean towel.            10.  Wear clean pajamas.            11.  Place clean sheets on your bed the night of your first shower and do not  sleep with pets. Day of Surgery : Do not apply any  lotions/deodorants the morning of surgery.  Please wear clean clothes to the hospital/surgery center.  FAILURE TO FOLLOW THESE INSTRUCTIONS MAY RESULT IN THE CANCELLATION OF YOUR SURGERY PATIENT SIGNATURE_________________________________  NURSE SIGNATURE__________________________________  ________________________________________________________________________

## 2022-04-16 ENCOUNTER — Encounter (HOSPITAL_COMMUNITY)
Admission: RE | Admit: 2022-04-16 | Discharge: 2022-04-16 | Disposition: A | Discharge status: Home / Self Care | Payer: Medicare HMO | Source: Ambulatory Visit | Attending: Orthopedic Surgery | Admitting: Orthopedic Surgery

## 2022-04-16 ENCOUNTER — Other Ambulatory Visit: Payer: Self-pay

## 2022-04-16 ENCOUNTER — Encounter (HOSPITAL_COMMUNITY): Payer: Self-pay

## 2022-04-16 VITALS — BP 143/88 | HR 57 | Temp 98.1°F | Resp 16 | Ht 64.0 in | Wt 166.0 lb

## 2022-04-16 DIAGNOSIS — Z01812 Encounter for preprocedural laboratory examination: Secondary | ICD-10-CM | POA: Diagnosis not present

## 2022-04-16 DIAGNOSIS — E119 Type 2 diabetes mellitus without complications: Secondary | ICD-10-CM | POA: Diagnosis not present

## 2022-04-16 DIAGNOSIS — M1712 Unilateral primary osteoarthritis, left knee: Secondary | ICD-10-CM | POA: Insufficient documentation

## 2022-04-16 DIAGNOSIS — Z01818 Encounter for other preprocedural examination: Secondary | ICD-10-CM

## 2022-04-16 HISTORY — DX: Parkinson's disease without dyskinesia, without mention of fluctuations: G20.A1

## 2022-04-16 HISTORY — DX: Parkinson's disease: G20

## 2022-04-16 LAB — CBC
HCT: 39.6 % (ref 36.0–46.0)
Hemoglobin: 12.7 g/dL (ref 12.0–15.0)
MCH: 29.7 pg (ref 26.0–34.0)
MCHC: 32.1 g/dL (ref 30.0–36.0)
MCV: 92.7 fL (ref 80.0–100.0)
Platelets: 248 10*3/uL (ref 150–400)
RBC: 4.27 MIL/uL (ref 3.87–5.11)
RDW: 14.6 % (ref 11.5–15.5)
WBC: 5.8 10*3/uL (ref 4.0–10.5)
nRBC: 0 % (ref 0.0–0.2)

## 2022-04-16 LAB — SURGICAL PCR SCREEN
MRSA, PCR: NEGATIVE
Staphylococcus aureus: NEGATIVE

## 2022-04-16 LAB — BASIC METABOLIC PANEL
Anion gap: 8 (ref 5–15)
BUN: 35 mg/dL — ABNORMAL HIGH (ref 8–23)
CO2: 27 mmol/L (ref 22–32)
Calcium: 8.8 mg/dL — ABNORMAL LOW (ref 8.9–10.3)
Chloride: 106 mmol/L (ref 98–111)
Creatinine, Ser: 0.89 mg/dL (ref 0.44–1.00)
GFR, Estimated: >60 mL/min (ref >60)
Glucose, Bld: 96 mg/dL (ref 70–99)
Potassium: 4 mmol/L (ref 3.5–5.1)
Sodium: 141 mmol/L (ref 135–145)

## 2022-04-16 LAB — TYPE AND SCREEN
ABO/RH(D): A POS
Antibody Screen: NEGATIVE

## 2022-04-23 NOTE — H&P (Signed)
TOTAL KNEE ADMISSION H&P  Patient is being admitted for left total knee arthroplasty.  Subjective:  Chief Complaint:left knee pain.  HPI: Gwendolyn [name], 81 y.o. female, has a history of pain and functional disability in the left knee due to arthritis and has failed non-surgical conservative treatments for greater than 12 weeks to includeNSAID's and/or analgesics and activity modification.  Onset of symptoms was gradual, starting 2 years ago with gradually worsening course since that time. The patient noted no past surgery on the left knee(s).  Patient currently rates pain in the left knee(s) at 8 out of 10 with activity. Patient has worsening of pain with activity and weight bearing, pain that interferes with activities of daily living, and pain with passive range of motion.  Patient has evidence of joint space narrowing by imaging studies.  There is no active infection.  Patient Active Problem List   Diagnosis Date Noted   Parkinson's disease ([place]) 11/28/2021   B12 deficiency 05/28/2021   Cervical arthritis 09/27/2017   Open-angle glaucoma 05/14/2016   Generalized anxiety disorder 02/11/2016   Overweight (BMI 25.0-29.9) 04/25/2015   Medication management 02/12/2014   Essential hypertension 08/08/2013   Hyperlipidemia, mixed 08/08/2013   Abnormal glucose 08/08/2013   Vitamin D deficiency 08/08/2013   Past Medical History:  Diagnosis Date   Allergy    Arthritis    Blood transfusion without reported diagnosis    Cataract    removed both eyes    Cervical radicular pain 02/11/2016   Glaucoma    low pressure glaucoma    Hyperlipidemia    Hypertension    Osteopenia    Parkinson's disease ([place])    Pelvis fracture ([place]) 03/26/2002   both sides fx   Positive colorectal cancer screening using Cologuard test 04/14/2017   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    Vitamin D deficiency     Past Surgical History:  Procedure Laterality Date   COLONOSCOPY     DILATION AND  CURETTAGE OF UTERUS     age 38    right elbow   surgery  March 26, 2002   right hand ulner nerve surgery  March 26, 2002   I and d done right hand/wrist   right shoulder humerus fx  july 2003   surgery done   right total hip arthroplasty  2008   right total knee arthroplasty  sept 2012   right wrist plating  2004   TONSILLECTOMY  age 30   TOTAL HIP ARTHROPLASTY  10/22/2011   Procedure: TOTAL HIP ARTHROPLASTY ANTERIOR APPROACH;  Surgeon: Mauri Pole, MD;  Location: [facility];  Service: Orthopedics;  Laterality: Left;  Left Total Hip Arthroplasty,  Anterior Approach     No current facility-administered medications for this encounter.   Current Outpatient Medications  Medication Sig Dispense Refill Last Dose   ALPRAZolam (XANAX) 0.5 MG tablet TAKE 1/2 TO 1 (ONE-HALF TO ONE) TABLET BY MOUTH THREE TIMES DAILY AS NEEDED FOR ANXIETY (Patient taking differently: Take 0.25 mg by mouth at bedtime as needed for sleep.) 90 tablet 0    ascorbic acid (VITAMIN C) 500 MG tablet Take 500 mg by mouth daily.      aspirin EC 81 MG tablet Take 81 mg by mouth at bedtime.      carbidopa-levodopa (SINEMET IR) 25-100 MG tablet Take 1 tablet by mouth 3 (three) times daily. 7am/11am/4pm 270 tablet 1    CHELATED MAGNESIUM PO Take 250 mg by mouth 2 (two) times  daily.      cholecalciferol (VITAMIN D3) 25 MCG (1000 UNIT) tablet Take 1,000 Units by mouth in the morning and at bedtime.      cyanocobalamin (VITAMIN B12) 1000 MCG tablet Take 1,000 mcg by mouth daily.      fexofenadine (ALLEGRA) 180 MG tablet Take 180 mg by mouth daily as needed for allergies or rhinitis.      furosemide (LASIX) 40 MG tablet Take 1 tablet (40 mg total) by mouth as needed for edema. 30 tablet 3    losartan-hydrochlorothiazide (HYZAAR) 50-12.5 MG tablet TAKE ONE TABLET BY MOUTH DAILY FOR BLOOD PRESSURE 90 tablet 3    naproxen sodium (ALEVE) 220 MG tablet Take 440 mg by mouth 2 (two) times daily as needed (pain).      Omega-3 Fatty Acids (FISH  OIL) 1000 MG CAPS Take 1,000 mg by mouth 2 (two) times daily.      pregabalin (LYRICA) 150 MG capsule TAKE ONE CAPSULE BY MOUTH FOUR TIMES A DAY FOR CHRONIC PAIN (Patient taking differently: Take 150 mg by mouth at bedtime. FOR CHRONIC PAIN) 360 capsule 0    rosuvastatin (CRESTOR) 20 MG tablet Take 1 tablet MWF for Cholesterol (Patient taking differently: Take 20 mg by mouth every Monday, Wednesday, and Friday. Take 1 tablet MWF for Cholesterol) 36 tablet 3    timolol (TIMOPTIC) 0.5 % ophthalmic solution Place 1 drop into both eyes every morning.       zinc gluconate 50 MG tablet Take 50 mg by mouth daily.      triamcinolone ointment (KENALOG) 0.1 % Apply 1 application topically 2 (two) times daily. (Patient not taking: Reported on 04/13/2022) 80 g 1 Not Taking   Allergies  Allergen Reactions   Oxycodone-Acetaminophen Other (See Comments)    panic attack   Vit C-Cholecalciferol-Rose Hip [Cholecalciferol-Vitamin C] Other (See Comments)    dyspepsia   Ibuprofen Hives   Macrodantin [Nitrofurantoin Macrocrystal] Rash    Social History   Tobacco Use   Smoking status: Never   Smokeless tobacco: Never  Substance Use Topics   Alcohol use: Yes    Comment: social- rare    Family History  Problem Relation Age of Onset   Hypertension Mother    Diabetes Mother    Heart disease Mother        enlarged heart   Atrial fibrillation Mother    Congestive Heart Failure Mother    Diabetes Father    Heart disease Father    Heart attack Father 48   Tremor Father    Atrial fibrillation Brother    Atrial fibrillation Brother    Breast cancer Maternal Grandmother        early 66's   Tremor Paternal Grandmother    Breast cancer Maternal Aunt        early 104's   Breast cancer Paternal Aunt        71's   Colon cancer Neg Hx    Esophageal cancer Neg Hx    Rectal cancer Neg Hx    Stomach cancer Neg Hx    Pancreatic cancer Neg Hx      Review of Systems  Constitutional:  Negative for chills and  fever.  Respiratory:  Negative for cough and shortness of breath.   Cardiovascular:  Negative for chest pain.  Gastrointestinal:  Negative for nausea and vomiting.  Musculoskeletal:  Positive for arthralgias.     Objective:  Physical Exam Well nourished and well developed. General: Alert and oriented x3, cooperative and  pleasant, no acute distress. Head: normocephalic, atraumatic, neck supple. Eyes: EOMI.  Musculoskeletal: Left knee exam: No palpable effusion, warmth or erythema Valgus left knee with slight flexion contracture with passively correctable valgus Tenderness lateral and anterior   Calves soft and nontender. Motor function intact in LE. Strength 5/5 LE bilaterally. Neuro: Distal pulses 2+. Sensation to light touch intact in LE.  Vital signs in last 24 hours:    Labs:   Estimated body mass index is 28.49 kg/m as calculated from the following:   Height as of 04/16/22: '5\' 4"'$  (1.626 m).   Weight as of 04/16/22: 75.3 kg.   Imaging Review Plain radiographs demonstrate severe degenerative joint disease of the left knee(s). The overall alignment ismild valgus. The bone quality appears to be adequate for age and reported activity level.      Assessment/Plan:  End stage arthritis, left knee   The patient history, physical examination, clinical judgment of the provider and imaging studies are consistent with end stage degenerative joint disease of the left knee(s) and total knee arthroplasty is deemed medically necessary. The treatment options including medical management, injection therapy arthroscopy and arthroplasty were discussed at length. The risks and benefits of total knee arthroplasty were presented and reviewed. The risks due to aseptic loosening, infection, stiffness, patella tracking problems, thromboembolic complications and other imponderables were discussed. The patient acknowledged the explanation, agreed to proceed with the plan and consent was signed.  Patient is being admitted for inpatient treatment for surgery, pain control, PT, OT, prophylactic antibiotics, VTE prophylaxis, progressive ambulation and ADL's and discharge planning. The patient is planning to be discharged  home.  Therapy Plans: outpatient therapy at [facility] Disposition: Home with daughter (coming for 1 week) Planned DVT Prophylaxis: aspirin '81mg'$  BID DME needed: walker PCP: Dr. Melford Aase, clearance received TXA: IV Allergies: ibuprofen - hives (tolerates aleve, thinks she took meloxicam okay), oxycodone - panic attacks, macrodantin - hives Anesthesia Concerns: none BMI: 30 Last HgbA1c: Not diabetic   Other: - hydrocodone, robaxin, no NSAIDs   Patient's anticipated LOS is less than 2 midnights, meeting these requirements: - Younger than 47 - Lives within 1 hour of care - Has a competent adult at home to recover with post-op recover - NO history of  - Chronic pain requiring opiods  - Diabetes  - Coronary Artery Disease  - Heart failure  - Heart attack  - Stroke  - DVT/VTE  - Cardiac arrhythmia  - Respiratory Failure/COPD  - Renal failure  - Anemia  - Advanced Liver disease  Costella Hatcher, PA-C Orthopedic Surgery [facility] [place] [phone]

## 2022-04-28 ENCOUNTER — Observation Stay (HOSPITAL_COMMUNITY)
Admission: RE | Admit: 2022-04-28 | Discharge: 2022-04-29 | Disposition: A | Discharge status: Home / Self Care | Payer: Medicare HMO | Attending: Orthopedic Surgery | Admitting: Orthopedic Surgery

## 2022-04-28 ENCOUNTER — Other Ambulatory Visit: Payer: Self-pay

## 2022-04-28 ENCOUNTER — Ambulatory Visit (HOSPITAL_BASED_OUTPATIENT_CLINIC_OR_DEPARTMENT_OTHER): Payer: Medicare HMO | Admitting: Certified Registered Nurse Anesthetist

## 2022-04-28 ENCOUNTER — Encounter (HOSPITAL_COMMUNITY)
Admission: RE | Disposition: A | Discharge status: Home / Self Care | Payer: Self-pay | Source: Home / Self Care | Attending: Orthopedic Surgery

## 2022-04-28 ENCOUNTER — Encounter (HOSPITAL_COMMUNITY): Payer: Self-pay | Admitting: Orthopedic Surgery

## 2022-04-28 ENCOUNTER — Ambulatory Visit (HOSPITAL_COMMUNITY): Payer: Medicare HMO | Admitting: Physician Assistant

## 2022-04-28 DIAGNOSIS — I10 Essential (primary) hypertension: Secondary | ICD-10-CM | POA: Diagnosis not present

## 2022-04-28 DIAGNOSIS — Z96643 Presence of artificial hip joint, bilateral: Secondary | ICD-10-CM | POA: Diagnosis not present

## 2022-04-28 DIAGNOSIS — Z96651 Presence of right artificial knee joint: Secondary | ICD-10-CM | POA: Diagnosis not present

## 2022-04-28 DIAGNOSIS — Z7982 Long term (current) use of aspirin: Secondary | ICD-10-CM | POA: Diagnosis not present

## 2022-04-28 DIAGNOSIS — G8918 Other acute postprocedural pain: Secondary | ICD-10-CM | POA: Diagnosis not present

## 2022-04-28 DIAGNOSIS — Z79899 Other long term (current) drug therapy: Secondary | ICD-10-CM | POA: Diagnosis not present

## 2022-04-28 DIAGNOSIS — M1712 Unilateral primary osteoarthritis, left knee: Principal | ICD-10-CM | POA: Insufficient documentation

## 2022-04-28 DIAGNOSIS — Z01818 Encounter for other preprocedural examination: Secondary | ICD-10-CM

## 2022-04-28 DIAGNOSIS — Z96652 Presence of left artificial knee joint: Secondary | ICD-10-CM

## 2022-04-28 DIAGNOSIS — G20 Parkinson's disease: Secondary | ICD-10-CM | POA: Insufficient documentation

## 2022-04-28 HISTORY — PX: TOTAL KNEE ARTHROPLASTY: SHX125

## 2022-04-28 SURGERY — ARTHROPLASTY, KNEE, TOTAL
Anesthesia: Spinal | Site: Knee | Laterality: Left

## 2022-04-28 MED ORDER — BUPIVACAINE-EPINEPHRINE (PF) 0.25% -1:200000 IJ SOLN
INTRAMUSCULAR | Status: DC | PRN
  Administered 2022-04-28: 30 mL

## 2022-04-28 MED ORDER — ONDANSETRON HCL 4 MG PO TABS: 4.0000 mg | ORAL_TABLET | Freq: Four times a day (QID) | ORAL | Status: DC | PRN

## 2022-04-28 MED ORDER — SODIUM CHLORIDE 0.9 % IR SOLN
Status: DC | PRN
  Administered 2022-04-28: 1000 mL

## 2022-04-28 MED ORDER — METOCLOPRAMIDE HCL 5 MG/ML IJ SOLN: 5.0000 mg | Freq: Three times a day (TID) | INTRAMUSCULAR | Status: DC | PRN

## 2022-04-28 MED ORDER — ACETAMINOPHEN 325 MG PO TABS: 325.0000 mg | ORAL_TABLET | Freq: Four times a day (QID) | ORAL | Status: DC | PRN

## 2022-04-28 MED ORDER — METOCLOPRAMIDE HCL 5 MG PO TABS: 5.0000 mg | ORAL_TABLET | Freq: Three times a day (TID) | ORAL | Status: DC | PRN

## 2022-04-28 MED ORDER — DEXAMETHASONE SODIUM PHOSPHATE 10 MG/ML IJ SOLN: 8.0000 mg | Freq: Once | INTRAMUSCULAR | Status: DC

## 2022-04-28 MED ORDER — FUROSEMIDE 40 MG PO TABS
40.0000 mg | ORAL_TABLET | ORAL | Status: DC | PRN
Start: 2022-04-29 — End: 2022-04-29

## 2022-04-28 MED ORDER — BUPIVACAINE IN DEXTROSE 0.75-8.25 % IT SOLN
INTRATHECAL | Status: DC | PRN
  Administered 2022-04-28: 1.6 mL via INTRATHECAL

## 2022-04-28 MED ORDER — HYDROCODONE-ACETAMINOPHEN 5-325 MG PO TABS
1.0000 | ORAL_TABLET | ORAL | Status: DC | PRN
  Administered 2022-04-28 – 2022-04-29 (×4): 2 via ORAL
  Filled 2022-04-28 (×4): qty 2

## 2022-04-28 MED ORDER — MEPERIDINE HCL 50 MG/ML IJ SOLN: 6.2500 mg | INTRAMUSCULAR | Status: DC | PRN

## 2022-04-28 MED ORDER — METHOCARBAMOL 500 MG IVPB - SIMPLE MED: 500.0000 mg | Freq: Four times a day (QID) | INTRAVENOUS | Status: DC | PRN

## 2022-04-28 MED ORDER — LOSARTAN POTASSIUM-HCTZ 50-12.5 MG PO TABS: 1.0000 | ORAL_TABLET | Freq: Every day | ORAL | Status: DC

## 2022-04-28 MED ORDER — CEFAZOLIN SODIUM-DEXTROSE 2-4 GM/100ML-% IV SOLN
2.0000 g | INTRAVENOUS | Status: AC
  Administered 2022-04-28: 2 g via INTRAVENOUS
  Filled 2022-04-28: qty 100

## 2022-04-28 MED ORDER — PROPOFOL 500 MG/50ML IV EMUL
INTRAVENOUS | Status: DC | PRN
  Administered 2022-04-28: 75 ug/kg/min via INTRAVENOUS
  Administered 2022-04-28: 30 mg via INTRAVENOUS

## 2022-04-28 MED ORDER — BISACODYL 10 MG RE SUPP: 10.0000 mg | Freq: Every day | RECTAL | Status: DC | PRN

## 2022-04-28 MED ORDER — METHOCARBAMOL 500 MG PO TABS
500.0000 mg | ORAL_TABLET | Freq: Four times a day (QID) | ORAL | Status: DC | PRN
  Administered 2022-04-28 – 2022-04-29 (×3): 500 mg via ORAL
  Filled 2022-04-28 (×3): qty 1

## 2022-04-28 MED ORDER — LACTATED RINGERS IV SOLN: INTRAVENOUS | Status: DC

## 2022-04-28 MED ORDER — HYDROMORPHONE HCL 1 MG/ML IJ SOLN: 0.2500 mg | INTRAMUSCULAR | Status: DC | PRN

## 2022-04-28 MED ORDER — SODIUM CHLORIDE (PF) 0.9 % IJ SOLN
INTRAMUSCULAR | Status: AC
  Filled 2022-04-28: qty 30

## 2022-04-28 MED ORDER — POLYETHYLENE GLYCOL 3350 17 G PO PACK: 17.0000 g | PACK | Freq: Every day | ORAL | Status: DC | PRN

## 2022-04-28 MED ORDER — PHENOL 1.4 % MT LIQD: 1.0000 | OROMUCOSAL | Status: DC | PRN

## 2022-04-28 MED ORDER — SODIUM CHLORIDE 0.9 % IV SOLN: INTRAVENOUS | Status: DC

## 2022-04-28 MED ORDER — AMISULPRIDE (ANTIEMETIC) 5 MG/2ML IV SOLN: 10.0000 mg | Freq: Once | INTRAVENOUS | Status: DC | PRN

## 2022-04-28 MED ORDER — FERROUS SULFATE 325 (65 FE) MG PO TABS
325.0000 mg | ORAL_TABLET | Freq: Three times a day (TID) | ORAL | Status: DC
  Administered 2022-04-29: 325 mg via ORAL
  Filled 2022-04-28: qty 1

## 2022-04-28 MED ORDER — CHLORHEXIDINE GLUCONATE 0.12 % MT SOLN
15.0000 mL | Freq: Once | OROMUCOSAL | Status: AC
  Administered 2022-04-28: 15 mL via OROMUCOSAL

## 2022-04-28 MED ORDER — FENTANYL CITRATE PF 50 MCG/ML IJ SOSY
50.0000 ug | PREFILLED_SYRINGE | INTRAMUSCULAR | Status: AC
  Administered 2022-04-28: 25 ug via INTRAVENOUS
  Filled 2022-04-28: qty 2

## 2022-04-28 MED ORDER — TRANEXAMIC ACID-NACL 1000-0.7 MG/100ML-% IV SOLN
1000.0000 mg | Freq: Once | INTRAVENOUS | Status: AC
  Administered 2022-04-28: 1000 mg via INTRAVENOUS
  Filled 2022-04-28: qty 100

## 2022-04-28 MED ORDER — ACETAMINOPHEN 160 MG/5ML PO SOLN: 325.0000 mg | Freq: Once | ORAL | Status: DC | PRN

## 2022-04-28 MED ORDER — TRANEXAMIC ACID-NACL 1000-0.7 MG/100ML-% IV SOLN
1000.0000 mg | INTRAVENOUS | Status: AC
  Administered 2022-04-28: 1000 mg via INTRAVENOUS
  Filled 2022-04-28: qty 100

## 2022-04-28 MED ORDER — 0.9 % SODIUM CHLORIDE (POUR BTL) OPTIME
TOPICAL | Status: DC | PRN
  Administered 2022-04-28: 1000 mL

## 2022-04-28 MED ORDER — BUPIVACAINE-EPINEPHRINE (PF) 0.25% -1:200000 IJ SOLN
INTRAMUSCULAR | Status: AC
  Filled 2022-04-28: qty 30

## 2022-04-28 MED ORDER — TIMOLOL MALEATE 0.5 % OP SOLN
1.0000 [drp] | Freq: Every morning | OPHTHALMIC | Status: DC
  Administered 2022-04-29: 1 [drp] via OPHTHALMIC
  Filled 2022-04-28: qty 5

## 2022-04-28 MED ORDER — HYDROCODONE-ACETAMINOPHEN 7.5-325 MG PO TABS: 1.0000 | ORAL_TABLET | ORAL | Status: DC | PRN

## 2022-04-28 MED ORDER — DOCUSATE SODIUM 100 MG PO CAPS
100.0000 mg | ORAL_CAPSULE | Freq: Two times a day (BID) | ORAL | Status: DC
  Administered 2022-04-28 – 2022-04-29 (×2): 100 mg via ORAL
  Filled 2022-04-28 (×2): qty 1

## 2022-04-28 MED ORDER — CEFAZOLIN SODIUM-DEXTROSE 2-4 GM/100ML-% IV SOLN
2.0000 g | Freq: Four times a day (QID) | INTRAVENOUS | Status: AC
  Administered 2022-04-28 (×2): 2 g via INTRAVENOUS
  Filled 2022-04-28 (×2): qty 100

## 2022-04-28 MED ORDER — ONDANSETRON HCL 4 MG/2ML IJ SOLN: 4.0000 mg | Freq: Four times a day (QID) | INTRAMUSCULAR | Status: DC | PRN

## 2022-04-28 MED ORDER — PREGABALIN 75 MG PO CAPS
150.0000 mg | ORAL_CAPSULE | Freq: Every day | ORAL | Status: DC
  Administered 2022-04-28: 150 mg via ORAL
  Filled 2022-04-28: qty 2

## 2022-04-28 MED ORDER — MENTHOL 3 MG MT LOZG: 1.0000 | LOZENGE | OROMUCOSAL | Status: DC | PRN

## 2022-04-28 MED ORDER — DIPHENHYDRAMINE HCL 12.5 MG/5ML PO ELIX: 12.5000 mg | ORAL_SOLUTION | ORAL | Status: DC | PRN

## 2022-04-28 MED ORDER — ASPIRIN 81 MG PO CHEW
81.0000 mg | CHEWABLE_TABLET | Freq: Two times a day (BID) | ORAL | Status: DC
  Administered 2022-04-28 – 2022-04-29 (×2): 81 mg via ORAL
  Filled 2022-04-28 (×2): qty 1

## 2022-04-28 MED ORDER — DEXAMETHASONE SODIUM PHOSPHATE 10 MG/ML IJ SOLN
10.0000 mg | Freq: Once | INTRAMUSCULAR | Status: AC
  Administered 2022-04-29: 10 mg via INTRAVENOUS
  Filled 2022-04-28: qty 1

## 2022-04-28 MED ORDER — KETOROLAC TROMETHAMINE 30 MG/ML IJ SOLN
INTRAMUSCULAR | Status: AC
  Filled 2022-04-28: qty 1

## 2022-04-28 MED ORDER — KETOROLAC TROMETHAMINE 30 MG/ML IJ SOLN
INTRAMUSCULAR | Status: DC | PRN
  Administered 2022-04-28: 30 mg

## 2022-04-28 MED ORDER — HYDROCHLOROTHIAZIDE 12.5 MG PO TABS
12.5000 mg | ORAL_TABLET | Freq: Every day | ORAL | Status: DC
  Administered 2022-04-29: 12.5 mg via ORAL
  Filled 2022-04-28: qty 1

## 2022-04-28 MED ORDER — ROPIVACAINE HCL 5 MG/ML IJ SOLN
INTRAMUSCULAR | Status: DC | PRN
  Administered 2022-04-28: 30 mL via PERINEURAL

## 2022-04-28 MED ORDER — ACETAMINOPHEN 325 MG PO TABS: 325.0000 mg | ORAL_TABLET | Freq: Once | ORAL | Status: DC | PRN

## 2022-04-28 MED ORDER — LORATADINE 10 MG PO TABS
10.0000 mg | ORAL_TABLET | Freq: Every day | ORAL | Status: DC
  Administered 2022-04-29: 10 mg via ORAL
  Filled 2022-04-28: qty 1

## 2022-04-28 MED ORDER — LIDOCAINE 2% (20 MG/ML) 5 ML SYRINGE
INTRAMUSCULAR | Status: DC | PRN
  Administered 2022-04-28: 60 mg via INTRAVENOUS

## 2022-04-28 MED ORDER — SODIUM CHLORIDE (PF) 0.9 % IJ SOLN
INTRAMUSCULAR | Status: DC | PRN
  Administered 2022-04-28: 30 mL

## 2022-04-28 MED ORDER — MORPHINE SULFATE (PF) 2 MG/ML IV SOLN: 0.5000 mg | INTRAVENOUS | Status: DC | PRN

## 2022-04-28 MED ORDER — POVIDONE-IODINE 10 % EX SWAB
2.0000 | Freq: Once | CUTANEOUS | Status: AC
  Administered 2022-04-28: 2 via TOPICAL

## 2022-04-28 MED ORDER — BUPIVACAINE HCL 0.25 % IJ SOLN
INTRAMUSCULAR | Status: AC
  Filled 2022-04-28: qty 1

## 2022-04-28 MED ORDER — LOSARTAN POTASSIUM 50 MG PO TABS
50.0000 mg | ORAL_TABLET | Freq: Every day | ORAL | Status: DC
  Administered 2022-04-29: 50 mg via ORAL
  Filled 2022-04-28: qty 1

## 2022-04-28 MED ORDER — ACETAMINOPHEN 10 MG/ML IV SOLN
1000.0000 mg | Freq: Once | INTRAVENOUS | Status: DC | PRN
Start: 2022-04-28 — End: 2022-04-28

## 2022-04-28 MED ORDER — ROSUVASTATIN CALCIUM 20 MG PO TABS: 20.0000 mg | ORAL_TABLET | ORAL | Status: DC

## 2022-04-28 MED ORDER — PROMETHAZINE HCL 25 MG/ML IJ SOLN: 6.2500 mg | INTRAMUSCULAR | Status: DC | PRN

## 2022-04-28 MED ORDER — SODIUM CHLORIDE (PF) 0.9 % IJ SOLN
INTRAMUSCULAR | Status: AC
  Filled 2022-04-28: qty 50

## 2022-04-28 MED ORDER — CARBIDOPA-LEVODOPA 25-100 MG PO TABS
1.0000 | ORAL_TABLET | Freq: Three times a day (TID) | ORAL | Status: DC
  Administered 2022-04-28 – 2022-04-29 (×2): 1 via ORAL
  Filled 2022-04-28 (×2): qty 1

## 2022-04-28 MED ORDER — DEXAMETHASONE SODIUM PHOSPHATE 10 MG/ML IJ SOLN
INTRAMUSCULAR | Status: DC | PRN
  Administered 2022-04-28: 10 mg via INTRAVENOUS

## 2022-04-28 MED ORDER — ORAL CARE MOUTH RINSE: 15.0000 mL | Freq: Once | OROMUCOSAL | Status: AC

## 2022-04-28 SURGICAL SUPPLY — 53 items
ATTUNE MED ANAT PAT 32 KNEE (Knees) IMPLANT
ATTUNE PSFEM LTSZ4 NARCEM KNEE (Femur) IMPLANT
ATTUNE PSRP INSR SZ4 6 KNEE (Insert) IMPLANT
BAG COUNTER SPONGE SURGICOUNT (BAG) IMPLANT
BAG ZIPLOCK 12X15 (MISCELLANEOUS) IMPLANT
BASEPLATE TIBIAL ROTATING SZ 4 (Knees) IMPLANT
BLADE SAW SGTL 11.0X1.19X90.0M (BLADE) IMPLANT
BLADE SAW SGTL 13.0X1.19X90.0M (BLADE) ×1 IMPLANT
BNDG ELASTIC 6X5.8 VLCR STR LF (GAUZE/BANDAGES/DRESSINGS) ×1 IMPLANT
BOWL SMART MIX CTS (DISPOSABLE) ×1 IMPLANT
CEMENT HV SMART SET (Cement) IMPLANT
CUFF TOURN SGL QUICK 34 (TOURNIQUET CUFF) ×1
CUFF TRNQT CYL 34X4.125X (TOURNIQUET CUFF) ×1 IMPLANT
DERMABOND ADVANCED (GAUZE/BANDAGES/DRESSINGS) ×1
DERMABOND ADVANCED .7 DNX12 (GAUZE/BANDAGES/DRESSINGS) ×1 IMPLANT
DRAPE SHEET LG 3/4 BI-LAMINATE (DRAPES) ×1 IMPLANT
DRAPE U-SHAPE 47X51 STRL (DRAPES) ×1 IMPLANT
DRESSING AQUACEL AG SP 3.5X10 (GAUZE/BANDAGES/DRESSINGS) ×1 IMPLANT
DRSG AQUACEL AG ADV 3.5X10 (GAUZE/BANDAGES/DRESSINGS) IMPLANT
DRSG AQUACEL AG SP 3.5X10 (GAUZE/BANDAGES/DRESSINGS) ×1
DURAPREP 26ML APPLICATOR (WOUND CARE) ×2 IMPLANT
ELECT REM PT RETURN 15FT ADLT (MISCELLANEOUS) ×1 IMPLANT
GLOVE BIO SURGEON STRL SZ 6 (GLOVE) ×1 IMPLANT
GLOVE BIOGEL PI IND STRL 6.5 (GLOVE) ×1 IMPLANT
GLOVE BIOGEL PI IND STRL 7.5 (GLOVE) ×1 IMPLANT
GLOVE BIOGEL PI INDICATOR 6.5 (GLOVE) ×1
GLOVE BIOGEL PI INDICATOR 7.5 (GLOVE) ×1
GLOVE ORTHO TXT STRL SZ7.5 (GLOVE) ×2 IMPLANT
GOWN STRL REUS W/ TWL LRG LVL3 (GOWN DISPOSABLE) ×2 IMPLANT
GOWN STRL REUS W/TWL LRG LVL3 (GOWN DISPOSABLE) ×2
HANDPIECE INTERPULSE COAX TIP (DISPOSABLE) ×1
HOLDER FOLEY CATH W/STRAP (MISCELLANEOUS) IMPLANT
KIT TURNOVER KIT A (KITS) IMPLANT
MANIFOLD NEPTUNE II (INSTRUMENTS) ×1 IMPLANT
NDL SAFETY ECLIP 18X1.5 (MISCELLANEOUS) IMPLANT
NS IRRIG 1000ML POUR BTL (IV SOLUTION) ×1 IMPLANT
PACK TOTAL KNEE CUSTOM (KITS) ×1 IMPLANT
PROTECTOR NERVE ULNAR (MISCELLANEOUS) ×1 IMPLANT
SET HNDPC FAN SPRY TIP SCT (DISPOSABLE) ×1 IMPLANT
SET PAD KNEE POSITIONER (MISCELLANEOUS) ×1 IMPLANT
SPIKE FLUID TRANSFER (MISCELLANEOUS) ×2 IMPLANT
SUT MNCRL AB 4-0 PS2 18 (SUTURE) ×1 IMPLANT
SUT STRATAFIX PDS+ 0 24IN (SUTURE) ×1 IMPLANT
SUT VIC AB 1 CT1 36 (SUTURE) ×1 IMPLANT
SUT VIC AB 2-0 CT1 27 (SUTURE) ×2
SUT VIC AB 2-0 CT1 TAPERPNT 27 (SUTURE) ×2 IMPLANT
SYR 3ML LL SCALE MARK (SYRINGE) ×1 IMPLANT
TOWEL GREEN STERILE FF (TOWEL DISPOSABLE) ×1 IMPLANT
TRAY CATH INTERMITTENT SS 16FR (CATHETERS) ×1 IMPLANT
TRAY FOLEY MTR SLVR 16FR STAT (SET/KITS/TRAYS/PACK) ×1 IMPLANT
TUBE SUCTION HIGH CAP CLEAR NV (SUCTIONS) ×1 IMPLANT
WATER STERILE IRR 1000ML POUR (IV SOLUTION) ×2 IMPLANT
WRAP KNEE MAXI GEL POST OP (GAUZE/BANDAGES/DRESSINGS) ×1 IMPLANT

## 2022-04-28 NOTE — Op Note (Signed)
NAME:  Gwendolyn [name]                      MEDICAL RECORD NO.:  [id number]                             FACILITY:  [facility]      PHYSICIAN:  Pietro Cassis. Alvan Dame, M.D.  DATE OF BIRTH:  [birth year]      DATE OF PROCEDURE:  04/28/2022                                     OPERATIVE REPORT         PREOPERATIVE DIAGNOSIS:  Left knee osteoarthritis.      POSTOPERATIVE DIAGNOSIS:  Left knee osteoarthritis.      FINDINGS:  The patient was noted to have complete loss of cartilage and   bone-on-bone arthritis with associated osteophytes in the lateral and patellofemoral compartments of   the knee with a significant synovitis and associated effusion.  The patient had failed months of conservative treatment including medications, injection therapy, activity modification.     PROCEDURE:  Left total knee replacement.      COMPONENTS USED:  DePuy Attune rotating platform posterior stabilized knee   system, a size 4N femur, 4 tibia, size 6 mm PS AOX insert, and 32 anatomic patellar   button.      SURGEON:  Pietro Cassis. Alvan Dame, M.D.      ASSISTANT:  Costella Hatcher, PA-C.      ANESTHESIA:  Regional and Spinal.      SPECIMENS:  None.      COMPLICATION:  None.      DRAINS:  None.  EBL: <100 cc      TOURNIQUET TIME:  30 min at 225 mmHg     The patient was stable to the recovery room.      INDICATION FOR PROCEDURE:  Gwendolyn [name] is a 81 y.o. female patient of   mine.  The patient had been seen, evaluated, and treated for months conservatively in the   office with medication, activity modification, and injections.  The patient had   radiographic changes of bone-on-bone arthritis with endplate sclerosis and osteophytes noted.  Based on the radiographic changes and failed conservative measures, the patient   decided to proceed with definitive treatment, total knee replacement.  Risks of infection, DVT, component failure, need for revision surgery, neurovascular injury were reviewed in the office  setting.  The postop course was reviewed stressing the efforts to maximize post-operative satisfaction and function.  Consent was obtained for benefit of pain   relief.      PROCEDURE IN DETAIL:  The patient was brought to the operative theater.   Once adequate anesthesia, preoperative antibiotics, 2 gm of Ancef,1 gm of Tranexamic Acid, and 10 mg of Decadron administered, the patient was positioned supine with a left thigh tourniquet placed.  The  left lower extremity was prepped and draped in sterile fashion.  A time-   out was performed identifying the patient, planned procedure, and the appropriate extremity.      The left lower extremity was placed in the [facility] leg holder.  The leg was   exsanguinated, tourniquet elevated to 250 mmHg.  A midline incision was   made followed by median parapatellar arthrotomy.  Following initial   exposure, attention was  first directed to the patella.  Precut   measurement was noted to be 20 mm.  I resected down to 13 mm and used a   32 anatomic patellar button to restore patellar height as well as cover the cut surface.      The lug holes were drilled and a metal shim was placed to protect the   patella from retractors and saw blade during the procedure.      At this point, attention was now directed to the femur.  The femoral   canal was opened with a drill, irrigated to try to prevent fat emboli.  An   intramedullary rod was passed at 3 degrees valgus, 9 mm of bone was   resected off the distal femur.  Following this resection, the tibia was   subluxated anteriorly.  Using the extramedullary guide, 3 mm of bone was resected off   the proximal medial tibia.  We confirmed the gap would be   stable medially and laterally with a size 5 spacer block as well as confirmed that the tibial cut was perpendicular in the coronal plane, checking with an alignment rod.      Once this was done, I sized the femur to be a size 4 in the anterior-   posterior  dimension, chose a narrow component based on medial and   lateral dimension.  The size 4 rotation block was then pinned in   position anterior referenced using the C-clamp to set rotation.  The   anterior, posterior, and  chamfer cuts were made without difficulty nor   notching making certain that I was along the anterior cortex to help   with flexion gap stability.      The final box cut was made off the lateral aspect of distal femur.      At this point, the tibia was sized to be a size 4.  The size 4 tray was   then pinned in position through the medial third of the tubercle,   drilled, and keel punched.  Trial reduction was now carried with a 4 femur,  4 tibia, a size 6 mm PS insert, and the 32 anatomic patella botton.  The knee was brought to full extension with good flexion stability with the patella   tracking through the trochlea without application of pressure.  Given   all these findings the trial components removed.  Final components were   opened and cement was mixed.  The knee was irrigated with normal saline solution and pulse lavage.  The synovial lining was   then injected with 30 cc of 0.25% Marcaine with epinephrine, 1 cc of Toradol and 30 cc of NS for a total of 61 cc.     Final implants were then cemented onto cleaned and dried cut surfaces of bone with the knee brought to extension with a size 6 mm PS trial insert.      Once the cement had fully cured, excess cement was removed   throughout the knee.  I confirmed that I was satisfied with the range of   motion and stability, and the final size 6 mm PS AOX insert was chosen.  It was   placed into the knee.      The tourniquet had been let down at 30 minutes.  No significant   hemostasis was required.  The extensor mechanism was then reapproximated using #1 Vicryl and #1 Stratafix sutures with the knee   in flexion.  The  remaining wound was closed with 2-0 Vicryl and running 4-0 Monocryl.   The knee was cleaned,  dried, dressed sterilely using Dermabond and   Aquacel dressing.  The patient was then   brought to recovery room in stable condition, tolerating the procedure   well.   Please note that Physician Assistant, Costella Hatcher, PA-C was present for the entirety of the case, and was utilized for pre-operative positioning, peri-operative retractor management, general facilitation of the procedure and for primary wound closure at the end of the case.              Pietro Cassis Alvan Dame, M.D.    04/28/2022 11:17 AM

## 2022-04-28 NOTE — Discharge Instructions (Signed)

## 2022-04-28 NOTE — Anesthesia Postprocedure Evaluation (Signed)
Anesthesia Post Note  Patient: Gwendolyn [name]  Procedure(s) Performed: TOTAL KNEE ARTHROPLASTY (Left: Knee)     Patient location during evaluation: PACU Anesthesia Type: Spinal Level of consciousness: oriented and awake and alert Pain management: pain level controlled Vital Signs Assessment: post-procedure vital signs reviewed and stable Respiratory status: spontaneous breathing, respiratory function stable and patient connected to nasal cannula oxygen Cardiovascular status: blood pressure returned to baseline and stable Postop Assessment: no headache, no backache and no apparent nausea or vomiting Anesthetic complications: no   No notable events documented.  Last Vitals:  Vitals:   04/28/22 1515 04/28/22 1530  BP: 135/86 127/80  Pulse: (!) 49 (!) 53  Resp: 16 17  Temp:    SpO2: 99% 97%    Last Pain:  Vitals:   04/28/22 1530  TempSrc:   PainSc: 0-No pain                 Effie Berkshire

## 2022-04-28 NOTE — Evaluation (Signed)
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  Clinical Impression  Pt is s/p TKA resulting in the deficits listed below (see PT Problem List).  Pt will benefit from acute PT to increase their independence and safety with mobility to allow discharge home alone, with dtr being able to assist for the first week.  Pt tolerated session this evening well and predict she will continue to do so tomorrow as well.         Recommendations for follow up therapy are one component of a multi-disciplinary discharge planning process, led by the attending physician.  Recommendations may be updated based on patient status, additional functional criteria and insurance authorization.  Follow Up Recommendations Follow physician's recommendations for discharge plan and follow up therapies (pt has OPPT Friday at [facility])      Assistance Recommended at Discharge Intermittent Supervision/Assistance  Patient can return home with the following  A little help with walking and/or transfers;A little help with bathing/dressing/bathroom;Assistance with cooking/housework    Equipment Recommendations Rolling walker (2 wheels)  Recommendations for Other Services       Functional Status Assessment Patient has had a recent decline in their functional status and demonstrates the ability to make significant improvements in function in a reasonable and predictable amount of time.     Precautions / Restrictions Restrictions Weight Bearing Restrictions: No LLE Weight Bearing: Weight bearing as tolerated      Mobility  Bed Mobility Overal bed mobility: Modified Independent                   Transfers Overall transfer level: Needs assistance Equipment used: Rolling walker (2 wheels) Transfers: Sit to/from Stand Sit to Stand: Min guard           General transfer comment: cues for RW safety adn using hands to guide ascent and descent for sit to stand.    Ambulation/Gait Ambulation/Gait assistance: Supervision Gait Distance (Feet): 10 Feet (in room only because it was late and she didn't want to go into hallway this evening. ( she was also very cold)) Assistive device: Rolling walker (2 wheels) Gait Pattern/deviations: Step-to pattern       General Gait Details: step to pattern due to POD0 and safety measure, small steps due to safety measures as well.  Stairs            Wheelchair Mobility    Modified Rankin (Stroke Patients Only)       Balance Overall balance assessment: Needs assistance Sitting-balance support: Feet supported Sitting balance-Leahy Scale: Normal     Standing balance support: Bilateral upper extremity supported, During functional activity Standing balance-Leahy Scale: Fair                               Pertinent Vitals/Pain Pain Assessment Pain Assessment: 0-10 Pain Score: 2  Pain Location: across top of knee incision Pain Descriptors / Indicators: Burning Pain Intervention(s): Limited activity within patient's tolerance, Monitored during session, Ice applied    Home Living Family/patient expects to be discharged to:: Private residence Living Arrangements: Non-relatives/Friends Available Help at Discharge:  Family (dtr staying for first week) Type of Home: House Home Access: Stairs to enter Entrance Stairs-Rails: None Entrance Stairs-Number of Steps: 1 (1 plus 1)   Home Layout: One level Home Equipment: Cane - single point      Prior Function Prior Level of Function : Independent/Modified Independent             Mobility Comments: was using a cane occassionaly due to Left knee felt "  differrent length"       Hand Dominance        Extremity/Trunk Assessment        Lower Extremity Assessment Lower Extremity Assessment: LLE deficits/detail LLE Deficits / Details: all muscle activity intact WNL, and pt also able to perform SLR independently 10 times. Reports toes fell a little nub but this is something that has been occurring for some time now, not new.       Communication   Communication: No difficulties  Cognition Arousal/Alertness: Awake/alert Behavior During Therapy: WFL for tasks assessed/performed Overall Cognitive Status: Within Functional Limits for tasks assessed                                          General Comments      Exercises Total Joint Exercises Ankle Circles/Pumps: AROM, Both, 10 reps Quad Sets: AROM, Left, 10 reps Heel Slides: AAROM, Supine, 10 reps, Left Hip ABduction/ADduction: AAROM, Left, 5 reps, Supine Straight Leg Raises: AAROM, Left, 5 reps, Supine Goniometric ROM: grossly 0-80 in sitting.   Assessment/Plan    PT Assessment Patient needs continued PT services  PT Problem List Decreased range of motion;Decreased strength;Decreased activity tolerance       PT Treatment Interventions DME instruction;Gait training;Stair training;Functional mobility training;Therapeutic activities;Therapeutic exercise;Patient/family education    PT Goals (Current goals can be found in the Care Plan section)  Acute Rehab PT Goals Patient Stated Goal: I hope to be able to mow my grass again ( long term goal), short terem is to get home PT Goal Formulation: With patient Time For Goal Achievement: 05/05/22 Potential to Achieve Goals: Good    Frequency 7X/week     Co-evaluation               AM-PAC PT "6 Clicks" Mobility  Outcome Measure Help needed turning from your back to your side while in a flat bed without using bedrails?: A Little Help needed moving from lying on your back to sitting on the side of a flat  bed without using bedrails?: A Little Help needed moving to and from a bed to a chair (including a wheelchair)?: A Little Help needed standing up from a chair using your arms (e.g., wheelchair or bedside chair)?: A Little Help needed to walk in hospital room?: A Little Help needed climbing 3-5 steps with a railing? : A Little 6 Click Score: 18    End of Session Equipment Utilized During Treatment: Gait belt Activity Tolerance: Patient tolerated treatment well Patient left: in chair;with call bell/phone within reach;with chair alarm set;with family/visitor present Nurse Communication: Mobility status PT Visit Diagnosis: Other abnormalities of gait and mobility (R26.89)    Time: 4268-3419 PT Time Calculation (min) (ACUTE ONLY): 35 min   Charges:   PT Evaluation $PT Eval Low Complexity: 1 Low PT Treatments $Gait Training: 8-22 mins       Dent Plantz, PT, MPT Acute Rehabilitation Services Office: [phone]  04/28/2022  Clide Dales 04/28/2022, 7:42 PM

## 2022-04-28 NOTE — Interval H&P Note (Signed)
History and Physical Interval Note:  04/28/2022 11:15 AM  Gwendolyn [name]  has presented today for surgery, with the diagnosis of Left knee osteoarthritis.  The various methods of treatment have been discussed with the patient and family. After consideration of risks, benefits and other options for treatment, the patient has consented to  Procedure(s): TOTAL KNEE ARTHROPLASTY (Left) as a surgical intervention.  The patient's history has been reviewed, patient examined, no change in status, stable for surgery.  I have reviewed the patient's chart and labs.  Questions were answered to the patient's satisfaction.     Mauri Pole

## 2022-04-28 NOTE — Transfer of Care (Signed)
Immediate Anesthesia Transfer of Care Note  Patient: Gwendolyn [name]  Procedure(s) Performed: Procedure(s): TOTAL KNEE ARTHROPLASTY (Left)  Patient Location: PACU  Anesthesia Type:Spinal  Level of Consciousness: awake, alert  and oriented  Airway & Oxygen Therapy: Patient Spontanous Breathing  Post-op Assessment: Report given to RN and Post -op Vital signs reviewed and stable  Post vital signs: Reviewed and stable  Last Vitals:  Vitals:   04/28/22 1142 04/28/22 1143  BP:    Pulse: (!) 58 (!) 56  Resp: 14 18  Temp:    SpO2: 30% 94%    Complications: No apparent anesthesia complications

## 2022-04-28 NOTE — Anesthesia Preprocedure Evaluation (Addendum)
Anesthesia Evaluation  Patient identified by MRN, date of birth, ID band Patient awake    Reviewed: Allergy & Precautions, NPO status , Patient's Chart, lab work & pertinent test results  Airway Mallampati: III  TM Distance: >3 FB Neck ROM: Full    Dental  (+) Teeth Intact, Dental Advisory Given   Pulmonary neg pulmonary ROS,    breath sounds clear to auscultation       Cardiovascular hypertension, Pt. on medications  Rhythm:Regular Rate:Normal     Neuro/Psych PSYCHIATRIC DISORDERS Anxiety    GI/Hepatic negative GI ROS, Neg liver ROS,   Endo/Other  negative endocrine ROS  Renal/GU negative Renal ROS     Musculoskeletal  (+) Arthritis , Osteoarthritis,    Abdominal Normal abdominal exam  (+)   Peds  Hematology negative hematology ROS (+)   Anesthesia Other Findings   Reproductive/Obstetrics                            Anesthesia Physical Anesthesia Plan  ASA: 2  Anesthesia Plan: Spinal   Post-op Pain Management: Regional block*   Induction: Intravenous  PONV Risk Score and Plan: 3 and Ondansetron, Propofol infusion, Treatment may vary due to age or medical condition and Dexamethasone  Airway Management Planned: Natural Airway and Simple Face Mask  Additional Equipment: None  Intra-op Plan:   Post-operative Plan:   Informed Consent: I have reviewed the patients History and Physical, chart, labs and discussed the procedure including the risks, benefits and alternatives for the proposed anesthesia with the patient or authorized representative who has indicated his/her understanding and acceptance.     Dental advisory given  Plan Discussed with: CRNA  Anesthesia Plan Comments: (Lab Results      Component                Value               Date                      WBC                      5.8                 04/16/2022                HGB                      12.7                 04/16/2022                HCT                      39.6                04/16/2022                MCV                      92.7                04/16/2022                PLT                      248  04/16/2022           )       Anesthesia Quick Evaluation

## 2022-04-28 NOTE — Plan of Care (Signed)
Problem: Education: Goal: Knowledge of the prescribed therapeutic regimen will improve Outcome: Progressing   Problem: Clinical Measurements: Goal: Postoperative complications will be avoided or minimized Outcome: Progressing   Problem: Pain Management: Goal: Pain level will decrease with appropriate interventions Outcome: [place], RN 04/28/22 7:04 PM

## 2022-04-28 NOTE — Anesthesia Procedure Notes (Signed)
Anesthesia Regional Block: Adductor canal block   Pre-Anesthetic Checklist: , timeout performed,  Correct Patient, Correct Site, Correct Laterality,  Correct Procedure, Correct Position, site marked,  Risks and benefits discussed,  Surgical consent,  Pre-op evaluation,  At surgeon's request and post-op pain management  Laterality: Left  Prep: chloraprep       Needles:  Injection technique: Single-shot  Needle Type: Echogenic Stimulator Needle     Needle Length: 9cm  Needle Gauge: 21     Additional Needles:   Procedures:,,,, ultrasound used (permanent image in chart),,    Narrative:  Start time: 04/28/2022 11:35 AM End time: 04/28/2022 11:40 AM Injection made incrementally with aspirations every 5 mL.  Performed by: Personally  Anesthesiologist: Effie Berkshire, MD  Additional Notes: Patient tolerated the procedure well. Local anesthetic introduced in an incremental fashion under minimal resistance after negative aspirations. No paresthesias were elicited. After completion of the procedure, no acute issues were identified and patient continued to be monitored by RN.

## 2022-04-28 NOTE — Anesthesia Procedure Notes (Signed)
Spinal  Start time: 04/28/2022 12:48 PM End time: 04/28/2022 12:50 PM Reason for block: surgical anesthesia Staffing Performed: anesthesiologist  Anesthesiologist: Effie Berkshire, MD Performed by: Effie Berkshire, MD Authorized by: Effie Berkshire, MD   Preanesthetic Checklist Completed: patient identified, IV checked, site marked, risks and benefits discussed, surgical consent, monitors and equipment checked, pre-op evaluation and timeout performed Spinal Block Patient position: sitting Prep: DuraPrep and site prepped and draped Location: L3-4 Injection technique: single-shot Needle Needle type: Pencan  Needle gauge: 24 G Needle length: 10 cm Needle insertion depth: 10 cm Assessment Events: CSF return and second provider Additional Notes Patient tolerated well. No immediate complications.  Functioning IV was confirmed and monitors were applied. Sterile prep and drape, including hand hygiene and sterile gloves were used. The patient was positioned and the back was prepped. The skin was anesthetized with lidocaine. Free flow of clear CSF was obtained prior to injecting local anesthetic into the CSF. The spinal needle aspirated freely following injection. The needle was carefully withdrawn. The patient tolerated the procedure well.  CRNA x1, MDA x1

## 2022-04-29 ENCOUNTER — Encounter (HOSPITAL_COMMUNITY): Payer: Self-pay | Admitting: Orthopedic Surgery

## 2022-04-29 DIAGNOSIS — Z96651 Presence of right artificial knee joint: Secondary | ICD-10-CM | POA: Diagnosis not present

## 2022-04-29 DIAGNOSIS — Z7982 Long term (current) use of aspirin: Secondary | ICD-10-CM | POA: Diagnosis not present

## 2022-04-29 DIAGNOSIS — Z96643 Presence of artificial hip joint, bilateral: Secondary | ICD-10-CM | POA: Diagnosis not present

## 2022-04-29 DIAGNOSIS — Z79899 Other long term (current) drug therapy: Secondary | ICD-10-CM | POA: Diagnosis not present

## 2022-04-29 DIAGNOSIS — M1712 Unilateral primary osteoarthritis, left knee: Secondary | ICD-10-CM | POA: Diagnosis not present

## 2022-04-29 DIAGNOSIS — I10 Essential (primary) hypertension: Secondary | ICD-10-CM | POA: Diagnosis not present

## 2022-04-29 DIAGNOSIS — G20 Parkinson's disease: Secondary | ICD-10-CM | POA: Diagnosis not present

## 2022-04-29 LAB — CBC
HCT: 31.7 % — ABNORMAL LOW (ref 36.0–46.0)
Hemoglobin: 10.4 g/dL — ABNORMAL LOW (ref 12.0–15.0)
MCH: 30.4 pg (ref 26.0–34.0)
MCHC: 32.8 g/dL (ref 30.0–36.0)
MCV: 92.7 fL (ref 80.0–100.0)
Platelets: 212 10*3/uL (ref 150–400)
RBC: 3.42 MIL/uL — ABNORMAL LOW (ref 3.87–5.11)
RDW: 14.6 % (ref 11.5–15.5)
WBC: 16.6 10*3/uL — ABNORMAL HIGH (ref 4.0–10.5)
nRBC: 0 % (ref 0.0–0.2)

## 2022-04-29 LAB — BASIC METABOLIC PANEL
Anion gap: 8 (ref 5–15)
BUN: 28 mg/dL — ABNORMAL HIGH (ref 8–23)
CO2: 24 mmol/L (ref 22–32)
Calcium: 8.3 mg/dL — ABNORMAL LOW (ref 8.9–10.3)
Chloride: 107 mmol/L (ref 98–111)
Creatinine, Ser: 1.02 mg/dL — ABNORMAL HIGH (ref 0.44–1.00)
GFR, Estimated: 56 mL/min — ABNORMAL LOW (ref >60)
Glucose, Bld: 153 mg/dL — ABNORMAL HIGH (ref 70–99)
Potassium: 3.8 mmol/L (ref 3.5–5.1)
Sodium: 139 mmol/L (ref 135–145)

## 2022-04-29 MED ORDER — METHOCARBAMOL 500 MG PO TABS: 500.0000 mg | ORAL_TABLET | Freq: Four times a day (QID) | ORAL | 0 refills | Status: DC | PRN

## 2022-04-29 MED ORDER — HYDROCODONE-ACETAMINOPHEN 7.5-325 MG PO TABS
1.0000 | ORAL_TABLET | ORAL | 0 refills | Status: DC | PRN
Start: 2022-04-29 — End: 2022-06-25

## 2022-04-29 MED ORDER — DOCUSATE SODIUM 100 MG PO CAPS: 100.0000 mg | ORAL_CAPSULE | Freq: Two times a day (BID) | ORAL | 0 refills | Status: DC

## 2022-04-29 MED ORDER — ASPIRIN 81 MG PO CHEW: 81.0000 mg | CHEWABLE_TABLET | Freq: Two times a day (BID) | ORAL | 0 refills | Status: AC

## 2022-04-29 MED ORDER — POLYETHYLENE GLYCOL 3350 17 G PO PACK: 17.0000 g | PACK | Freq: Every day | ORAL | 0 refills | Status: DC | PRN

## 2022-04-29 NOTE — Care Plan (Signed)
Ortho Bundle Case Management Note  Patient Details  Name: Gwendolyn [name] MRN: [id number] Date of Birth: 10-14-1940  L TKA on 04-28-22 DCP:  Home with dtr DME:  RW ordered through [facility] PT:  [facility] on 05-01-22                   DME Arranged:  Walker rolling DME Agency:  [employer]  HH Arranged:  NA [place] Agency:  NA  Additional Comments: Please contact me with any questions of if this plan should need to change.  Marianne Sofia, RN,CCM [facility]  [phone] 04/29/2022, 8:54 AM

## 2022-04-29 NOTE — Plan of Care (Signed)
Pt ready to DC home with daughter. 

## 2022-04-29 NOTE — TOC Transition Note (Signed)
Transition of Care (TOC) - CM/SW Discharge Note  Patient Details  Name: Gwendolyn [name] MRN: [id number] Date of Birth: 03/01/1941  Transition of Care (TOC) CM/SW Contact:  Megan S Glenn, LCSW Phone Number: 04/29/2022, 11:10 AM  Clinical Narrative: Patient is expected to discharge home after working with PT. CSW met with patient and her daughter to confirm discharge plan and needs. Patient will go home with OPPT at [facility] with the first appointment scheduled for 05/01/22. Patient will need a rolling walker, which was delivered to the room by [employer]. TOC signing off.  Final next level of care: OP Rehab Barriers to Discharge: No Barriers Identified  Patient Goals and CMS Choice Patient states their goals for this hospitalization and ongoing recovery are:: Discharge home with OPPT at [facility] CMS Medicare.gov Compare Post Acute Care list provided to:: Patient Choice offered to / list presented to : Patient  Discharge Plan and Services        DME Arranged: Walker rolling DME Agency: [employer] Representative spoke with at DME Agency: Prearranged in orthopedist's office HH Arranged: NA HH Agency: NA  Readmission Risk Interventions     No data to display          

## 2022-04-29 NOTE — Plan of Care (Signed)
  Problem: Education: Goal: Knowledge of the prescribed therapeutic regimen will improve Outcome: Progressing   Problem: Activity: Goal: Ability to avoid complications of mobility impairment will improve Outcome: Progressing   Problem: Pain Management: Goal: Pain level will decrease with appropriate interventions Outcome: Progressing   Problem: Education: Goal: Knowledge of General Education information will improve Description: Including pain rating scale, medication(s)/side effects and non-pharmacologic comfort measures Outcome: Progressing   Problem: Activity: Goal: Risk for activity intolerance will decrease Outcome: Progressing

## 2022-04-29 NOTE — Progress Notes (Signed)
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    PT Comments    Pt progressing well during PT session, see below for mobility. Pt is ready to d/c with family assist  as needed from PT standpoint   Recommendations for follow up therapy are one component of a multi-disciplinary discharge planning process, led by the attending physician.  Recommendations may be updated based on patient status, additional functional criteria and insurance authorization.  Follow Up Recommendations  Follow physician's recommendations for discharge plan and follow up therapies     Assistance Recommended at Discharge Intermittent Supervision/Assistance  Patient can return home with the following A little help with walking and/or transfers;A little help with bathing/dressing/bathroom;Assistance with cooking/housework   Equipment Recommendations  Rolling walker (2 wheels)    Recommendations for Other Services       Precautions / Restrictions Precautions Precautions: Knee Restrictions Weight Bearing Restrictions: No LLE Weight Bearing: Weight bearing as tolerated     Mobility  Bed Mobility               General bed mobility comments: in recliner    Transfers Overall transfer level: Needs assistance Equipment used: Rolling walker (2 wheels) Transfers: Sit to/from Stand Sit to Stand: Min guard, Supervision           General transfer comment: cues for hand placement and LLE position    Ambulation/Gait Ambulation/Gait assistance: Supervision Gait Distance (Feet): 120 Feet Assistive device: Rolling walker (2 wheels) Gait Pattern/deviations: Step-to  pattern       General Gait Details: good stability, beginning step through patttern, initial cues for RW position   Stairs Stairs: Yes Stairs assistance: Min guard Stair Management: No rails, Step to pattern, Backwards, With walker Number of Stairs: 1 General stair comments: cues for sequence, good stability, no knee buckling   Wheelchair Mobility    Modified Rankin (Stroke Patients Only)       Balance           Standing balance support: Bilateral upper extremity supported, During functional activity Standing balance-Leahy Scale: Fair                              Cognition Arousal/Alertness: Awake/alert Behavior During Therapy: WFL for tasks assessed/performed Overall Cognitive Status: Within Functional Limits for tasks assessed                                          Exercises Total Joint Exercises Ankle Circles/Pumps: AROM, Both, 10 reps Quad Sets: AROM, Left, 10 reps Heel Slides: AAROM, Supine, 10 reps, Left Straight Leg Raises: AROM, Left, 15 reps    General Comments        Pertinent Vitals/Pain Pain Assessment Pain Assessment: 0-10 Pain Score: 5  Pain Location: across top of knee incision Pain Descriptors / Indicators: Burning Pain Intervention(s): Limited activity within patient's tolerance, Monitored during session, Premedicated before session, Ice applied    Home Living  Prior Function            PT Goals (current goals can now be found in the care plan section) Acute Rehab PT Goals Patient Stated Goal: I hope to be able to mow my grass again ( long term goal), short terem is to get home PT Goal Formulation: With patient Time For Goal Achievement: 05/05/22 Potential to Achieve Goals: Good Progress towards PT goals: Progressing toward goals    Frequency    7X/week      PT Plan Current plan remains appropriate    Co-evaluation              AM-PAC PT "6  Clicks" Mobility   Outcome Measure  Help needed turning from your back to your side while in a flat bed without using bedrails?: A Little Help needed moving from lying on your back to sitting on the side of a flat bed without using bedrails?: A Little Help needed moving to and from a bed to a chair (including a wheelchair)?: A Little Help needed standing up from a chair using your arms (e.g., wheelchair or bedside chair)?: A Little Help needed to walk in hospital room?: A Little Help needed climbing 3-5 steps with a railing? : A Little 6 Click Score: 18    End of Session Equipment Utilized During Treatment: Gait belt Activity Tolerance: Patient tolerated treatment well Patient left: in chair;with call bell/phone within reach;with chair alarm set;with family/visitor present Nurse Communication: Mobility status PT Visit Diagnosis: Other abnormalities of gait and mobility (R26.89)     Time: 4680-3212 PT Time Calculation (min) (ACUTE ONLY): 33 min  Charges:  $Gait Training: 8-22 mins $Therapeutic Exercise: 8-22 mins                     Baxter Flattery, PT  Acute Rehab Dept [facility]) [phone]  [facility] Weekend Pager (Saturday/Sunday only)  [phone]  04/29/2022    [facility] 04/29/2022, 1:00 PM

## 2022-04-29 NOTE — Progress Notes (Signed)
Subjective: 1 Day Post-Op Procedure(s) (LRB): TOTAL KNEE ARTHROPLASTY (Left) Patient reports pain as mild.   Patient seen in rounds with Dr. Alvan Dame. Patient is well, and has had no acute complaints or problems. No acute events overnight. Foley catheter removed. Patient ambulated 10 feet with PT.  We will continue therapy today.   Objective: Vital signs in last 24 hours: Temp:  [97.6 F (36.4 C)-98.5 F (36.9 C)] 97.9 F (36.6 C) (08/30 0532) Pulse Rate:  [49-71] 64 (08/30 0532) Resp:  [11-20] 17 (08/30 0532) BP: (111-152)/(42-86) 118/56 (08/30 0532) SpO2:  [93 %-100 %] 96 % (08/30 0532) Weight:  [75.3 kg] 75.3 kg (08/29 1050)  Intake/Output from previous day:  Intake/Output Summary (Last 24 hours) at 04/29/2022 0800 Last data filed at 04/29/2022 0600 Gross per 24 hour  Intake 1828.78 ml  Output 2380 ml  Net -551.22 ml     Intake/Output this shift: No intake/output data recorded.  Labs: Recent Labs    04/29/22 0337  HGB 10.4*   Recent Labs    04/29/22 0337  WBC 16.6*  RBC 3.42*  HCT 31.7*  PLT 212   Recent Labs    04/29/22 0337  NA 139  K 3.8  CL 107  CO2 24  BUN 28*  CREATININE 1.02*  GLUCOSE 153*  CALCIUM 8.3*   No results for input(s): "LABPT", "INR" in the last 72 hours.  Exam: General - Patient is Alert and Oriented Extremity - Neurologically intact Sensation intact distally Intact pulses distally Dorsiflexion/Plantar flexion intact Dressing - dressing C/D/I Motor Function - intact, moving foot and toes well on exam.   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   Weight as of this encounter: 75.3 kg. Advance diet Up with therapy D/C IV fluids   Patient's anticipated LOS is less than 2 midnights, meeting these requirements: - Younger than 14 - Lives within 1 hour of care - Has a competent adult at home to recover with post-op recover - NO history of  - Chronic pain requiring opiods  - Diabetes  - Coronary Artery Disease  - Heart failure  - Heart attack  - Stroke  - DVT/VTE  - Cardiac arrhythmia  - Respiratory Failure/COPD  - Renal failure  - Anemia  - Advanced Liver disease     DVT Prophylaxis - Aspirin Weight bearing as tolerated.  Hgb stable at 10.4 this AM. Cr. 1.04 - near baseline - no NSAIDs  Plan is to go Home after hospital stay. Plan for discharge today following 1-2 sessions of PT as long as they are meeting their goals. Patient is scheduled for OPPT. Follow up in the office in 2  weeks.   Griffith Citron, PA-C Orthopedic Surgery [phone] 04/29/2022, 8:00 AM

## 2022-05-01 DIAGNOSIS — M25562 Pain in left knee: Secondary | ICD-10-CM | POA: Diagnosis not present

## 2022-05-06 DIAGNOSIS — M25562 Pain in left knee: Secondary | ICD-10-CM | POA: Diagnosis not present

## 2022-05-07 NOTE — Discharge Summary (Signed)
Patient ID: Gwendolyn [name] MRN: [id number] DOB/AGE: 1940-11-16 81 y.o.  Admit date: 04/28/2022 Discharge date: 04/29/2022  Admission Diagnoses:  Left knee osteoarthritis  Discharge Diagnoses:  Principal Problem:   S/P total knee arthroplasty, left   Past Medical History:  Diagnosis Date   Allergy    Arthritis    Blood transfusion without reported diagnosis    Cataract    removed both eyes    Cervical radicular pain 02/11/2016   Glaucoma    low pressure glaucoma    Hyperlipidemia    Hypertension    Osteopenia    Parkinson's disease ([place])    Pelvis fracture ([place]) 03/26/2002   both sides fx   Positive colorectal cancer screening using Cologuard test 04/14/2017   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 female who was admitted 04/28/2022 for operative treatment ofS/P total knee arthroplasty, left. Patient has severe unremitting pain that affects sleep, daily activities, and work/hobbies. After pre-op clearance the patient was taken to the operating room on 04/28/2022 and underwent  Procedure(s): TOTAL KNEE ARTHROPLASTY.    Patient was given perioperative antibiotics:  Anti-infectives (From admission, onward)    Start     Dose/Rate Route Frequency Ordered Stop   04/28/22 1830  ceFAZolin (ANCEF) IVPB 2g/100 mL premix        2 g 200 mL/hr over 30 Minutes Intravenous Every 6 hours 04/28/22 1624 04/29/22 0025   04/28/22 1030  ceFAZolin (ANCEF) IVPB 2g/100 mL premix        2 g 200 mL/hr over 30 Minutes Intravenous On call to O.R. 04/28/22 1023 04/28/22 1308        Patient was given sequential compression devices, early ambulation, and chemoprophylaxis to prevent DVT. Patient worked with PT and was meeting their goals regarding safe ambulation and transfers.  Patient benefited  maximally from hospital stay and there were no complications.    Recent vital signs: No data found.   Recent laboratory studies: No results for input(s): "WBC", "HGB", "HCT", "PLT", "NA", "K", "CL", "CO2", "BUN", "CREATININE", "GLUCOSE", "INR", "CALCIUM" in the last 72 hours.  Invalid input(s): "PT", "2"   Discharge Medications:   Allergies as of 04/29/2022       Reactions   Oxycodone-acetaminophen Other (See Comments)   panic attack   Vit C-cholecalciferol-rose Hip [cholecalciferol-vitamin C] Other (See Comments)   dyspepsia   Ibuprofen Hives   Macrodantin [nitrofurantoin Macrocrystal] Rash        Medication List     STOP taking these medications    aspirin EC 81 MG tablet Replaced by: aspirin 81 MG chewable tablet   naproxen sodium 220 MG tablet Commonly known as: ALEVE   triamcinolone ointment 0.1 % Commonly known as: KENALOG       TAKE these medications    ALPRAZolam 0.5 MG tablet Commonly known as: XANAX TAKE 1/2 TO 1 (ONE-HALF TO ONE) TABLET BY MOUTH THREE TIMES DAILY AS NEEDED FOR ANXIETY What changed: See the new instructions.   ascorbic acid 500 MG tablet Commonly known as: VITAMIN C Take 500 mg by mouth daily.   aspirin 81 MG chewable tablet Chew 1 tablet (81 mg total) by mouth 2 (two) times daily for 28 days. Replaces: aspirin EC 81 MG tablet   carbidopa-levodopa 25-100 MG tablet Commonly known as: SINEMET  IR Take 1 tablet by mouth 3 (three) times daily. 7am/11am/4pm   CHELATED MAGNESIUM PO Take 250 mg by mouth 2 (two) times daily.   cholecalciferol 25 MCG (1000 UNIT) tablet Commonly known as: VITAMIN D3 Take 1,000 Units by mouth in the morning and at bedtime.   cyanocobalamin 1000 MCG tablet Commonly known as: VITAMIN B12 Take 1,000 mcg by mouth daily.   docusate sodium 100 MG capsule Commonly known as: COLACE Take 1 capsule (100 mg total) by mouth 2 (two) times daily.   fexofenadine 180 MG tablet Commonly known as: ALLEGRA Take  180 mg by mouth daily as needed for allergies or rhinitis.   Fish Oil 1000 MG Caps Take 1,000 mg by mouth 2 (two) times daily.   furosemide 40 MG tablet Commonly known as: LASIX Take 1 tablet (40 mg total) by mouth as needed for edema.   HYDROcodone-acetaminophen 7.5-325 MG tablet Commonly known as: NORCO Take 1 tablet by mouth every 4 (four) hours as needed for severe pain.   losartan-hydrochlorothiazide 50-12.5 MG tablet Commonly known as: HYZAAR TAKE ONE TABLET BY MOUTH DAILY FOR BLOOD PRESSURE   methocarbamol 500 MG tablet Commonly known as: ROBAXIN Take 1 tablet (500 mg total) by mouth every 6 (six) hours as needed for muscle spasms.   polyethylene glycol 17 g packet Commonly known as: MIRALAX / GLYCOLAX Take 17 g by mouth daily as needed for mild constipation.   pregabalin 150 MG capsule Commonly known as: LYRICA TAKE ONE CAPSULE BY MOUTH FOUR TIMES A DAY FOR CHRONIC PAIN What changed:  how much to take how to take this when to take this additional instructions   rosuvastatin 20 MG tablet Commonly known as: CRESTOR Take 1 tablet MWF for Cholesterol What changed:  how much to take how to take this when to take this   timolol 0.5 % ophthalmic solution Commonly known as: TIMOPTIC Place 1 drop into both eyes every morning.   zinc gluconate 50 MG tablet Take 50 mg by mouth daily.               Discharge Care Instructions  (From admission, onward)           Start     Ordered   04/29/22 0000  Change dressing       Comments: Maintain surgical dressing until follow up in the clinic. If the edges start to pull up, may reinforce with tape. If the dressing is no longer working, may remove and cover with gauze and tape, but must keep the area dry and clean.  Call with any questions or concerns.   04/29/22 0803            Diagnostic Studies: No results found.  Disposition: Discharge disposition: 01-Home or Self Care       Discharge  Instructions     Call MD / Call 911   Complete by: As directed    If you experience chest pain or shortness of breath, CALL 911 and be transported to the hospital emergency room.  If you develope a fever above 101 F, pus (white drainage) or increased drainage or redness at the wound, or calf pain, call your surgeon's office.   Change dressing   Complete by: As directed    Maintain surgical dressing until follow up in the clinic. If the edges start to pull up, may reinforce with tape. If the dressing is no longer working, may remove and cover with gauze and tape, but must keep the  area dry and clean.  Call with any questions or concerns.   Constipation Prevention   Complete by: As directed    Drink plenty of fluids.  Prune juice may be helpful.  You may use a stool softener, such as Colace (over the counter) 100 mg twice a day.  Use MiraLax (over the counter) for constipation as needed.   Diet - low sodium heart healthy   Complete by: As directed    Increase activity slowly as tolerated   Complete by: As directed    Weight bearing as tolerated with assist device (walker, cane, etc) as directed, use it as long as suggested by your surgeon or therapist, typically at least 4-6 weeks.   Post-operative opioid taper instructions:   Complete by: As directed    POST-OPERATIVE OPIOID TAPER INSTRUCTIONS: It is important to wean off of your opioid medication as soon as possible. If you do not need pain medication after your surgery it is ok to stop day one. Opioids include: Codeine, Hydrocodone(Norco, Vicodin), Oxycodone(Percocet, oxycontin) and hydromorphone amongst others.  Long term and even short term use of opiods can cause: Increased pain response Dependence Constipation Depression Respiratory depression And more.  Withdrawal symptoms can include Flu like symptoms Nausea, vomiting And more Techniques to manage these symptoms Hydrate well Eat regular healthy meals Stay active Use  relaxation techniques(deep breathing, meditating, yoga) Do Not substitute Alcohol to help with tapering If you have been on opioids for less than two weeks and do not have pain than it is ok to stop all together.  Plan to wean off of opioids This plan should start within one week post op of your joint replacement. Maintain the same interval or time between taking each dose and first decrease the dose.  Cut the total daily intake of opioids by one tablet each day Next start to increase the time between doses. The last dose that should be eliminated is the evening dose.      TED hose   Complete by: As directed    Use stockings (TED hose) for 2 weeks on both leg(s).  You may remove them at night for sleeping.        Follow-up Information     Irving Copas, PA-C. Go on 05/13/2022.   Specialty: Orthopedic Surgery Why: You are scheduled for a follow up appointment on 05-13-22 at 3:45 pm. Contact information: [address] [place] [address] [phone]                  Signed: Irving Copas 05/07/2022, 6:55 AM

## 2022-05-08 DIAGNOSIS — M25562 Pain in left knee: Secondary | ICD-10-CM | POA: Diagnosis not present

## 2022-05-11 DIAGNOSIS — M25562 Pain in left knee: Secondary | ICD-10-CM | POA: Diagnosis not present

## 2022-05-13 DIAGNOSIS — M25562 Pain in left knee: Secondary | ICD-10-CM | POA: Diagnosis not present

## 2022-05-15 DIAGNOSIS — M25562 Pain in left knee: Secondary | ICD-10-CM | POA: Diagnosis not present

## 2022-05-18 DIAGNOSIS — M25562 Pain in left knee: Secondary | ICD-10-CM | POA: Diagnosis not present

## 2022-05-20 DIAGNOSIS — M25562 Pain in left knee: Secondary | ICD-10-CM | POA: Diagnosis not present

## 2022-05-21 ENCOUNTER — Encounter: Payer: Self-pay | Admitting: Internal Medicine

## 2022-05-21 NOTE — Patient Instructions (Signed)

## 2022-05-21 NOTE — Progress Notes (Signed)
Future Appointments  Date Time Provider Department  05/22/2022                       6 mo ov 10:30 AM Unk Pinto, MD [facility]  06/09/2022  8:45 AM Tat, Eustace Quail, DO [facility]  11/12/2022                       cpe 11:00 AM Unk Pinto, MD [facility]  02/15/2023                       wellness  9:00 AM Alycia Rossetti, NP [facility]    History of Present Illness:       This very nice 81 y.o.  [name]  (retired Marine scientist)  presents for 6 month follow up with HTN, HLD, Pre-Diabetes and Vitamin D Deficiency.  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        Patient is treated for HTN (2008) & BP has been controlled at home. Today's BP is at goal - 104/72. Patient has had no complaints of any cardiac type chest pain, palpitations, dyspnea / orthopnea / PND, dizziness, claudication, or dependent edema.       Hyperlipidemia is controlled with diet & Crestor. Patient denies myalgias or other med SE's. Last Lipids were at goal:  Lab Results  Component Value Date   CHOL 160 02/12/2022   HDL 65 02/12/2022   LDLCALC 79 02/12/2022   TRIG 78 02/12/2022   CHOLHDL 2.5 02/12/2022    Also, the patient has history of PreDiabetes (A1c 6.0% /2011)  and has had no symptoms of reactive hypoglycemia, diabetic polys, paresthesias or visual blurring.  Last A1c was Normal & at goal:  Lab Results  Component Value Date   HGBA1C 5.8 (H) 11/06/2021           Further, the patient also has history of Vitamin D Deficiency and supplements vitamin D without any suspected side-effects. Last vitamin D was at goal:  Lab Results  Component Value Date   VD25OH 71 11/06/2021    Current Outpatient Medications on File Prior to Visit  Medication Sig   ALPRAZolam (XANAX) 0.5 MG tablet Take 0.25 mg  at bedtime    ascorbic acid (VITAMIN C) 500 MG tablet Take daily.   aspirin 81 MG chewable tablet Chew 1 tablet  2  times daily for 28 days.   carbidopa-levodopa IR  25-100 MG  tablet Take 1 tablet 3 times daily   CHELATED MAGNESIUM 250 mg  Take 2  times daily.   cholecalciferol (VITAMIN D 1000 UNIT) tablet Take 1,000 Units  in the morning and at bedtime.   VITAMIN B12 1000 MCG tablet Take  daily.   COLACE 100 MG capsule Take 1 capsule  2 times daily.   ALLEGRA 180 MG tablet Take  daily as needed    furosemide 40 MG tablet Take 1 tablet  as needed for edema.   HYDROcodone-apap 7.5-325 MG tablet Take 1 tablet every 4  hours as needed for severe pain.   losartan-hctz 50-12.5 MG tablet TAKE ONE TABLET  DAILY    methocarbamol 500 MG tablet Take 1 tablet every 6 hours as needed   Omega-3 FISH OIL 1000 MG  Take  2  times daily.   polyethylene glycol  17 g packet Take 17 g   daily as needed  pregabalin (LYRICA) 150 MG capsule Take 150 mg  at bedtime   rosuvastatin (CRESTOR) 20 MG tablet Take 1 tablet MWF    TIMOPTIC 0.5 % ophth soln Place 1 drop into both eyes every morning.    zinc 50 MG tablet Take  daily.     Allergies  Allergen Reactions   Oxycodone-Acetaminophen Other (See Comments)    panic attack   Vit C-Cholecalciferol-Rose Hip [Cholecalciferol-Vitamin C] Other (See Comments)    dyspepsia   Ibuprofen Hives   Macrodantin  Rash    PMHx:   Past Medical History:  Diagnosis Date   Allergy    Arthritis    Blood transfusion without reported diagnosis    Cataract    removed both eyes    Cervical radicular pain 02/11/2016   Glaucoma    low pressure glaucoma    Hyperlipidemia    Hypertension    Osteopenia    Parkinson's disease ([place])    Pelvis fracture ([place]) 03/26/2002   both sides fx   Positive colorectal cancer screening using Cologuard test 04/14/2017   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    Vitamin D deficiency     Immunization History  Administered Date(s) Administered   Fluad Quad (high Dose) 05/23/2021   Influenza, High Dose  06/24/2018, 06/05/2019, 06/13/2020   Influenza 06/13/2013, 05/02/2015, 06/24/2018   Moderna Sars-Covid-2  Vacc 09/13/2019, 10/11/2019, 07/01/2020, 01/17/2021   Pneumococcal -13 05/24/2014   Pneumococcal -23 01/08/2009   Td 02/12/2022   Tdap 01/28/2012   Zoster, Live 08/31/2006    Past Surgical History:  Procedure Laterality Date   COLONOSCOPY     DILATION AND CURETTAGE OF UTERUS     age 25    right elbow   surgery  March 26, 2002   right hand ulner nerve surgery  March 26, 2002   I and d done right hand/wrist   right shoulder humerus fx  july 2003   surgery done   right total hip arthroplasty  2008   right total knee arthroplasty  sept 2012   right wrist plating  2004   TONSILLECTOMY  age 18   TOTAL HIP ARTHROPLASTY  10/22/2011   TOTAL HIP ARTHROPLASTY ANTERIOR APPROACH;   Mauri Pole, MD   TOTAL KNEE ARTHROPLASTY Left 04/28/2022   TOTAL KNEE ARTHROPLASTY;  Paralee Cancel, MD    FHx:    Reviewed / unchanged  SHx:    Reviewed / unchanged   Systems Review:  Constitutional: Denies fever, chills, wt changes, headaches, insomnia, fatigue, night sweats, change in appetite. Eyes: Denies redness, blurred vision, diplopia, discharge, itchy, watery eyes.  ENT: Denies discharge, congestion, post nasal drip, epistaxis, sore throat, earache, hearing loss, dental pain, tinnitus, vertigo, sinus pain, snoring.  CV: Denies chest pain, palpitations, irregular heartbeat, syncope, dyspnea, diaphoresis, orthopnea, PND, claudication or edema. Respiratory: denies cough, dyspnea, DOE, pleurisy, hoarseness, laryngitis, wheezing.  Gastrointestinal: Denies dysphagia, odynophagia, heartburn, reflux, water brash, abdominal pain or cramps, nausea, vomiting, bloating, diarrhea, constipation, hematemesis, melena, hematochezia  or hemorrhoids. Genitourinary: Denies dysuria, frequency, urgency, nocturia, hesitancy, discharge, hematuria or flank pain. Musculoskeletal: Denies arthralgias, myalgias, stiffness, jt. swelling, pain, limping or strain/sprain.  Skin: Denies pruritus, rash, hives, warts, acne, eczema or  change in skin lesion(s). Neuro: No weakness, tremor, incoordination, spasms, paresthesia or pain. Psychiatric: Denies confusion, memory loss or sensory loss. Endo: Denies change in weight, skin or hair change.  Heme/Lymph: No excessive bleeding, bruising or enlarged lymph nodes.  Physical Exam  BP 120/70  Pulse 74   Temp 97.9 F (36.6 C)   Resp 18   Ht 5' 2.25" (1.581 m)   Wt 167 lb 3.2 oz (75.8 kg)   SpO2 96%   BMI 30.34 kg/m   Appears  well nourished, well groomed  and in no distress.  Eyes: PERRLA, EOMs, conjunctiva no swelling or erythema. Sinuses: No frontal/maxillary tenderness ENT/Mouth: EAC's clear, TM's nl w/o erythema, bulging. Nares clear w/o erythema, swelling, exudates. Oropharynx clear without erythema or exudates. Oral hygiene is good. Tongue normal, non obstructing. Hearing intact.  Neck: Supple. Thyroid not palpable. Car 2+/2+ without bruits, nodes or JVD. Chest: Respirations nl with BS clear & equal w/o rales, rhonchi, wheezing or stridor.  Cor: Heart sounds normal w/ regular rate and rhythm without sig. murmurs, gallops, clicks or rubs. Peripheral pulses normal and equal  without edema.  Abdomen: Soft & bowel sounds normal. Non-tender w/o guarding, rebound, hernias, masses or organomegaly.  Lymphatics: Unremarkable.  Musculoskeletal: Full ROM all peripheral extremities, joint stability, 5/5 strength and normal gait.  Skin: Warm, dry without exposed rashes, lesions or ecchymosis apparent.  Neuro: Cranial nerves intact, reflexes equal bilaterally. Sensory-motor testing grossly intact. Tendon reflexes grossly intact.  Pysch: Alert & oriented x 3.  Insight and judgement nl & appropriate. No ideations.  Assessment and Plan:   1. Essential hypertension  - Continue medication, monitor blood pressure at home.  - Continue DASH diet.  Reminder to go to the ER if any CP,  SOB, nausea, dizziness, severe HA, changes vision/speech.   - CBC with  Differential/Platelet - COMPLETE METABOLIC PANEL WITH GFR - Magnesium - TSH  2. Hyperlipidemia, mixed  - Continue diet/meds, exercise,& lifestyle modifications.  - Continue monitor periodic cholesterol/liver & renal functions   - Lipid panel - TSH  3. Abnormal glucose  - Continue diet, exercise  - Lifestyle modifications.  - Monitor appropriate labs.    - Hemoglobin A1c - Insulin, random  4. Vitamin D deficiency  - Continue supplementation.   - VITAMIN D 25 Hydroxy   5. Parkinson's disease ([place])   6. 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 Medication management  - CBC with Differential/Platelet - COMPLETE METABOLIC PANEL WITH GFR - Magnesium - Lipid panel - TSH - Hemoglobin A1c - Insulin, random - VITAMIN D 25 Hydroxy \        Discussed  regular exercise, BP monitoring, weight control to achieve/maintain BMI less than 25 and discussed med and SE's. Recommended labs to assess and monitor clinical status with further disposition pending results of labs.  I discussed the assessment and treatment plan with the patient. The patient was provided an opportunity to ask questions and all were answered. The patient agreed with the plan and demonstrated an understanding of the instructions.  I provided over 30 minutes of exam, counseling, chart review and  complex critical decision making.   Kirtland Bouchard, MD

## 2022-05-22 ENCOUNTER — Encounter: Payer: Self-pay | Admitting: Internal Medicine

## 2022-05-22 ENCOUNTER — Ambulatory Visit (INDEPENDENT_AMBULATORY_CARE_PROVIDER_SITE_OTHER): Payer: Medicare HMO | Admitting: Internal Medicine

## 2022-05-22 VITALS — BP 120/70 | HR 74 | Temp 97.9°F | Resp 18 | Ht 62.25 in | Wt 167.2 lb

## 2022-05-22 DIAGNOSIS — R7309 Other abnormal glucose: Secondary | ICD-10-CM

## 2022-05-22 DIAGNOSIS — Z79899 Other long term (current) drug therapy: Secondary | ICD-10-CM

## 2022-05-22 DIAGNOSIS — I10 Essential (primary) hypertension: Secondary | ICD-10-CM | POA: Diagnosis not present

## 2022-05-22 DIAGNOSIS — G90519 Complex regional pain syndrome I of unspecified upper limb: Secondary | ICD-10-CM | POA: Diagnosis not present

## 2022-05-22 DIAGNOSIS — G20 Parkinson's disease: Secondary | ICD-10-CM

## 2022-05-22 DIAGNOSIS — E559 Vitamin D deficiency, unspecified: Secondary | ICD-10-CM

## 2022-05-22 DIAGNOSIS — E782 Mixed hyperlipidemia: Secondary | ICD-10-CM | POA: Diagnosis not present

## 2022-05-22 DIAGNOSIS — M25562 Pain in left knee: Secondary | ICD-10-CM | POA: Diagnosis not present

## 2022-05-23 ENCOUNTER — Encounter: Payer: Self-pay | Admitting: Internal Medicine

## 2022-05-23 NOTE — Progress Notes (Signed)
<><><><><><><><><><><><><><><><><><><><><><><><><><><><><><><><><> <><><><><><><><><><><><><><><><><><><><><><><><><><><><><><><><><> -   Test results slightly outside the reference range are not unusual. If there is anything important, I will review this with you,  otherwise it is considered normal test values.  If you have further questions,  please do not hesitate to contact me at the office or via My Chart.  <><><><><><><><><><><><><><><><><><><><><><><><><><><><><><><><><> <><><><><><><><><><><><><><><><><><><><><><><><><><><><><><><><><>  -  Total Chol = 191 - Great   - The bad LDL Chol = 106 has gone up a little , So recommend   - Recommend a stricter Low cholesterol diet   - Cholesterol only comes from animal sources                                                                   - ie. meat, dairy, egg yolks  - Eat all the vegetables you want.  - Avoid Meat, Avoid Meat,  Avoid Meat                                              - especially Red Meat - Beef AND Pork .  - Avoid cheese & dairy - milk & ice cream.     - Cheese is the most concentrated form of trans-fats which                                                is the worst thing to clog up our arteries.   - Veggie cheese is OK which can be found in the fresh produce section at                                                              [facility] or [employer] or [employer]  <><><><><><><><><><><><><><><><><><><><><><><><><><><><><><><><><> <><><><><><><><><><><><><><><><><><><><><><><><><><><><><><><><><>  -  A1c = 5.3% - Great  !  Is back in the Normal nonDiabetic range  <><><><><><><><><><><><><><><><><><><><><><><><><><><><><><><><><> <><><><><><><><><><><><><><><><><><><><><><><><><><><><><><><><><>  -  Vitamin D = 64 - Excellent - Please continue dose same   <><><><><><><><><><><><><><><><><><><><><><><><><><><><><><><><><> <><><><><><><><><><><><><><><><><><><><><><><><><><><><><><><><><>  -  All  Else - CBC - Kidneys - Electrolytes - Liver - Magnesium & Thyroid    - all  Normal / OK <><><><><><><><><><><><><><><><><><><><><><><><><><><><><><><><><> <><><><><><><><><><><><><><><><><><><><><><><><><><><><><><><><><>

## 2022-05-24 ENCOUNTER — Other Ambulatory Visit: Payer: Self-pay | Admitting: Neurology

## 2022-05-24 ENCOUNTER — Other Ambulatory Visit: Payer: Self-pay | Admitting: Internal Medicine

## 2022-05-24 DIAGNOSIS — G90519 Complex regional pain syndrome I of unspecified upper limb: Secondary | ICD-10-CM

## 2022-05-24 DIAGNOSIS — G20 Parkinson's disease: Secondary | ICD-10-CM

## 2022-05-24 MED ORDER — PREGABALIN 150 MG PO CAPS: ORAL_CAPSULE | ORAL | 0 refills | Status: DC

## 2022-05-25 ENCOUNTER — Telehealth: Payer: Medicare HMO | Admitting: Physician Assistant

## 2022-05-25 DIAGNOSIS — Z20822 Contact with and (suspected) exposure to covid-19: Secondary | ICD-10-CM

## 2022-05-25 LAB — CBC WITH DIFFERENTIAL/PLATELET
Absolute Monocytes: 598 {cells}/uL (ref 200–950)
Basophils Absolute: 53 {cells}/uL (ref 0–200)
Basophils Relative: 0.6 %
Eosinophils Absolute: 132 {cells}/uL (ref 15–500)
Eosinophils Relative: 1.5 %
HCT: 37.9 % (ref 35.0–45.0)
Hemoglobin: 12.3 g/dL (ref 11.7–15.5)
Lymphs Abs: 1223 {cells}/uL (ref 850–3900)
MCH: 29.5 pg (ref 27.0–33.0)
MCHC: 32.5 g/dL (ref 32.0–36.0)
MCV: 90.9 fL (ref 80.0–100.0)
MPV: 10.9 fL (ref 7.5–12.5)
Monocytes Relative: 6.8 %
Neutro Abs: 6794 {cells}/uL (ref 1500–7800)
Neutrophils Relative %: 77.2 %
Platelets: 465 10*3/uL — ABNORMAL HIGH (ref 140–400)
RBC: 4.17 Million/uL (ref 3.80–5.10)
RDW: 13.1 % (ref 11.0–15.0)
Total Lymphocyte: 13.9 %
WBC: 8.8 10*3/uL (ref 3.8–10.8)

## 2022-05-25 LAB — HEMOGLOBIN A1C
Hgb A1c MFr Bld: 5.3 % of total Hgb (ref <5.7)
Mean Plasma Glucose: 105 mg/dL
eAG (mmol/L): 5.8 mmol/L

## 2022-05-25 LAB — COMPLETE METABOLIC PANEL WITH GFR
AG Ratio: 1.4 (calc) (ref 1.0–2.5)
ALT: 12 U/L (ref 6–29)
AST: 21 U/L (ref 10–35)
Albumin: 4.2 g/dL (ref 3.6–5.1)
Alkaline phosphatase (APISO): 167 U/L — ABNORMAL HIGH (ref 37–153)
BUN: 24 mg/dL (ref 7–25)
CO2: 27 mmol/L (ref 20–32)
Calcium: 9.3 mg/dL (ref 8.6–10.4)
Chloride: 101 mmol/L (ref 98–110)
Creat: 0.77 mg/dL (ref 0.60–0.95)
Globulin: 3.1 g/dL (calc) (ref 1.9–3.7)
Glucose, Bld: 82 mg/dL (ref 65–99)
Potassium: 4.2 mmol/L (ref 3.5–5.3)
Sodium: 139 mmol/L (ref 135–146)
Total Bilirubin: 0.5 mg/dL (ref 0.2–1.2)
Total Protein: 7.3 g/dL (ref 6.1–8.1)
eGFR: 78 mL/min/{1.73_m2} (ref >=60)

## 2022-05-25 LAB — LIPID PANEL
Cholesterol: 191 mg/dL (ref <200)
HDL: 59 mg/dL (ref >=50)
LDL Cholesterol (Calc): 106 mg/dL (calc) — ABNORMAL HIGH
Non-HDL Cholesterol (Calc): 132 mg/dL (calc) — ABNORMAL HIGH (ref <130)
Total CHOL/HDL Ratio: 3.2 (calc) (ref <5.0)
Triglycerides: 138 mg/dL (ref <150)

## 2022-05-25 LAB — MAGNESIUM: Magnesium: 2.2 mg/dL (ref 1.5–2.5)

## 2022-05-25 LAB — TSH: TSH: 0.78 mIU/L (ref 0.40–4.50)

## 2022-05-25 LAB — INSULIN, RANDOM: Insulin: 13.9 u[IU]/mL

## 2022-05-25 LAB — VITAMIN D 25 HYDROXY (VIT D DEFICIENCY, FRACTURES): Vit D, 25-Hydroxy: 64 ng/mL (ref 30–100)

## 2022-05-25 MED ORDER — BENZONATATE 100 MG PO CAPS: 100.0000 mg | ORAL_CAPSULE | Freq: Three times a day (TID) | ORAL | 0 refills | Status: AC

## 2022-05-25 MED ORDER — NIRMATRELVIR/RITONAVIR (PAXLOVID)TABLET: 3.0000 | ORAL_TABLET | Freq: Two times a day (BID) | ORAL | 0 refills | Status: AC

## 2022-05-25 NOTE — Patient Instructions (Signed)
Gwendolyn [name], thank you for joining [facility], PA-C for today's virtual visit.  While this provider is not your primary care provider (PCP), if your PCP is located in our provider database this encounter information will be shared with them immediately following your visit.  Consent: (Patient) Gwendolyn [name] provided verbal consent for this virtual visit at the beginning of the encounter.  Current Medications:  Current Outpatient Medications:    ALPRAZolam (XANAX) 0.5 MG tablet, TAKE 1/2 TO 1 (ONE-HALF TO ONE) TABLET BY MOUTH THREE TIMES DAILY AS NEEDED FOR ANXIETY (Patient taking differently: Take 0.25 mg by mouth at bedtime as needed for sleep.), Disp: 90 tablet, Rfl: 0   ascorbic acid (VITAMIN C) 500 MG tablet, Take 500 mg by mouth daily., Disp: , Rfl:    aspirin 81 MG chewable tablet, Chew 1 tablet (81 mg total) by mouth 2 (two) times daily for 28 days., Disp: 56 tablet, Rfl: 0   carbidopa-levodopa (SINEMET IR) 25-100 MG tablet, Take 1 tablet by mouth 3 (three) times daily. 7am/11am/4pm, Disp: 270 tablet, Rfl: 1   CHELATED MAGNESIUM PO, Take 250 mg by mouth 2 (two) times daily., Disp: , Rfl:    cholecalciferol (VITAMIN D3) 25 MCG (1000 UNIT) tablet, Take 1,000 Units by mouth in the morning and at bedtime., Disp: , Rfl:    cyanocobalamin (VITAMIN B12) 1000 MCG tablet, Take 1,000 mcg by mouth daily., Disp: , Rfl:    docusate sodium (COLACE) 100 MG capsule, Take 1 capsule (100 mg total) by mouth 2 (two) times daily., Disp: 10 capsule, Rfl: 0   fexofenadine (ALLEGRA) 180 MG tablet, Take 180 mg by mouth daily as needed for allergies or rhinitis., Disp: , Rfl:    furosemide (LASIX) 40 MG tablet, Take 1 tablet (40 mg total) by mouth as needed for edema., Disp: 30 tablet, Rfl: 3   HYDROcodone-acetaminophen (NORCO) 7.5-325 MG tablet, Take 1 tablet by mouth every 4 (four) hours as needed for severe pain., Disp: 42 tablet, Rfl: 0   losartan-hydrochlorothiazide (HYZAAR) 50-12.5 MG  tablet, TAKE ONE TABLET BY MOUTH DAILY FOR BLOOD PRESSURE, Disp: 90 tablet, Rfl: 3   methocarbamol (ROBAXIN) 500 MG tablet, Take 1 tablet (500 mg total) by mouth every 6 (six) hours as needed for muscle spasms., Disp: 40 tablet, Rfl: 0   Omega-3 Fatty Acids (FISH OIL) 1000 MG CAPS, Take 1,000 mg by mouth 2 (two) times daily., Disp: , Rfl:    polyethylene glycol (MIRALAX / GLYCOLAX) 17 g packet, Take 17 g by mouth daily as needed for mild constipation., Disp: 14 each, Rfl: 0   pregabalin (LYRICA) 150 MG capsule, Take  1 capsule  at Bedtime  for Sleep & Chronic Pain                                   /               TAKE                                   BY                              MOUTH, Disp: 360 capsule, Rfl: 0   rosuvastatin (CRESTOR) 20 MG tablet, Take 1 tablet MWF for Cholesterol (Patient taking  differently: Take 20 mg by mouth every Monday, Wednesday, and Friday. Take 1 tablet MWF for Cholesterol), Disp: 36 tablet, Rfl: 3   timolol (TIMOPTIC) 0.5 % ophthalmic solution, Place 1 drop into both eyes every morning. , Disp: , Rfl:    zinc gluconate 50 MG tablet, Take 50 mg by mouth daily., Disp: , Rfl:    Medications ordered in this encounter:  No orders of the defined types were placed in this encounter.    *If you need refills on other medications prior to your next appointment, please contact your pharmacy*  Follow-Up: Call back or seek an in-person evaluation if the symptoms worsen or if the condition fails to improve as anticipated.  [facility] [phone]  Other Instructions Hold your Crestor and only take half of the dose of Hyzaar while taking Paxovid.   Take Tessalon as directed   Follow up with your regular doctor in 1 week for reassessment and seek care sooner if your symptoms worsen or fail to improve.   If you have been instructed to have an in-person evaluation today at a local Urgent Care facility, please use the link below. It will take you to a list of  all of our available [facility], including address, phone number and hours of operation. Please do not delay care.  [facility]  If you or a family member do not have a primary care provider, use the link below to schedule a visit and establish care. When you choose a [facility] primary care physician or advanced practice provider, you gain a long-term partner in health. Find a Primary Care Provider  Learn more about [facility]'s in-office and virtual care options: [facility]

## 2022-05-25 NOTE — Progress Notes (Signed)
[name], storck are scheduled for a virtual visit with your provider today.    Just as we do with appointments in the office, we must obtain your consent to participate.  Your consent will be active for this visit and any virtual visit you may have with one of our providers in the next 365 days.    If you have a MyChart account, I can also send a copy of this consent to you electronically.  All virtual visits are billed to your insurance company just like a traditional visit in the office.  As this is a virtual visit, video technology does not allow for your provider to perform a traditional examination.  This may limit your provider's ability to fully assess your condition.  If your provider identifies any concerns that need to be evaluated in person or the need to arrange testing such as labs, EKG, etc, we will make arrangements to do so.    Although advances in technology are sophisticated, we cannot ensure that it will always work on either your end or our end.  If the connection with a video visit is poor, we may have to switch to a telephone visit.  With either a video or telephone visit, we are not always able to ensure that we have a secure connection.   I need to obtain your verbal consent now.   Are you willing to proceed with your visit today?   Gwendolyn [name] has provided verbal consent on 05/25/2022 for a virtual visit (video or telephone).   Rodney Booze, PA-C 05/25/2022  10:41 AM   Date:  05/25/2022   ID:  Gwendolyn [name], DOB 04-Apr-1941, MRN [id number]  Patient Location: Home Provider Location: Home Office   Participants: Patient and Provider for Visit and Wrap up  Method of visit: Video  Location of Patient: Home Location of Provider: Home Office Consent was obtain for visit over the video. Services rendered by provider: Visit was performed via video  A video enabled telemedicine application was used and I verified that I am speaking with the correct person  using two identifiers.  PCP:  Unk Pinto, MD   Chief Complaint:  COVID  History of Present Illness:    Gwendolyn [name] is a 81 y.o. female with history as stated below. Presents video telehealth for an acute care visit  Pt states she started having congestion and a scratchy throat 3 days ago. She has since developed a cough and fever. She has taken tylenol and otc meds with some relief. Denies chest pain or shortness of breath.    Denies nvd.  Past Medical, Surgical, Social History, Allergies, and Medications have been Reviewed.  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   nirmatrelvir/ritonavir EUA (PAXLOVID)  20 x 150 MG & 10 x '100MG'$  TABS Take 3 tablets by mouth 2 (two) times daily for 5 days. (Take nirmatrelvir 150 mg two tablets twice daily for 5 days and ritonavir 100 mg one tablet twice daily for 5 days) Patient GFR is >60     Allergies:   Oxycodone-acetaminophen, Vit c-cholecalciferol-rose hip [cholecalciferol-vitamin c], Ibuprofen, and Macrodantin [nitrofurantoin macrocrystal]   ROS See HPI for history of present illness.  Physical Exam Constitutional:      General: She is not in acute distress.    Appearance: Normal appearance. She is not ill-appearing or toxic-appearing.  Pulmonary:     Comments: Speaking in full sentences Neurological:     Mental Status: She is alert.       MDM: pt with positive covid test. Sxs are minimal at this time. Rx for paxlovid given. Advised on meds to withhold or reduce while taking this med. Advised on pcp f/u. She voices understanding and is in agreement.        There are no diagnoses linked to this encounter.   Time:   Today, I have spent 26 minutes with the patient with telehealth technology discussing the above problems, reviewing the chart, previous notes, medications and orders.    Tests Ordered: No orders of the defined types were placed in this encounter.   Medication Changes: Meds ordered this encounter  Medications   nirmatrelvir/ritonavir EUA (PAXLOVID) 20 x 150 MG & 10 x '100MG'$  TABS    Sig: Take 3 tablets by mouth 2 (two) times daily for 5 days. (Take nirmatrelvir 150 mg two tablets twice daily for 5 days and ritonavir 100 mg one tablet twice daily for 5 days) Patient GFR is >60    Dispense:  30 tablet    Refill:  0    Order Specific Question:   Supervising Provider    Answer:   Chase Picket [[id number]]   benzonatate (TESSALON) 100 MG capsule    Sig: Take 1 capsule (100 mg total) by mouth every 8 (eight) hours for 5 days.    Dispense:  15 capsule    Refill:  0    Order Specific Question:   Supervising Provider    Answer:   Chase Picket [[id number]]     Disposition:  Follow up  Signed, Rodney Booze, PA-C  05/25/2022 10:41 AM

## 2022-05-27 ENCOUNTER — Ambulatory Visit: Payer: Medicare HMO | Admitting: Adult Health

## 2022-06-02 DIAGNOSIS — M25562 Pain in left knee: Secondary | ICD-10-CM | POA: Diagnosis not present

## 2022-06-04 DIAGNOSIS — M25562 Pain in left knee: Secondary | ICD-10-CM | POA: Diagnosis not present

## 2022-06-05 NOTE — Progress Notes (Deleted)
Assessment/Plan:   1.  Parkinsons Disease  -DaTscan was abnormal November 25, 2021 with decreased radiotracer activity bilaterally, right greater than left.  -Continue carbidopa/levodopa 25/100, 1 tablet 3 times per day.  2.  B12 deficiency  -Now on oral supplementation.  3.  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  -Patient does have mild evidence of peripheral neuropathy, which could explain the paresthesias of her toes.  This does not, however, explain her gait, nor does Parkinson's disease.  4.  Gait abnormality  -following with ortho for legs.  This looks like an ortho issue.  Told her I want her to f/u with dr. Alvan Dame as her gait abnormality is not explained by Parkinsons Disease.  She has no back pain.     Subjective:   Gwendolyn [name] was seen today in follow up for Parkinsons disease.  My previous records were reviewed prior to todays visit as well as outside records available to me.  Pt denies falls.  Pt denies lightheadedness, near syncope.  No hallucinations.  Mood has been good.  She had left knee replacement surgery by Dr. Alvan Dame on August 29.  Notes are reviewed.  Current prescribed movement disorder medications: Carbidopa/levodopa 25/100, 1 tablet 3 times per day.   ALLERGIES:   Allergies  Allergen Reactions   Oxycodone-Acetaminophen Other (See Comments)    panic attack   Vit C-Cholecalciferol-Rose Hip [Cholecalciferol-Vitamin C] Other (See Comments)    dyspepsia   Ibuprofen Hives   Macrodantin [Nitrofurantoin Macrocrystal] Rash    CURRENT MEDICATIONS:  No outpatient medications have been marked as taking for the 06/09/22 encounter (Appointment) with Labrisha Wuellner, Eustace Quail, DO.     Objective:   PHYSICAL EXAMINATION:    VITALS:   There were no vitals filed for this visit.   GEN:  The patient appears stated age and is in NAD. HEENT:  Normocephalic, atraumatic.  The mucous membranes are moist. The superficial temporal arteries are without ropiness or tenderness. CV:   RRR Lungs:  CTAB Neck/HEME:  There are no carotid bruits bilaterally.  Neurological examination:  Orientation: The patient is alert and oriented x3. Cranial nerves: There is good facial symmetry without facial hypomimia. The speech is fluent and clear. Soft palate rises symmetrically and there is no tongue deviation. Hearing is intact to conversational tone. Sensation: Sensation is intact to light touch throughout Motor: Strength is at least antigravity x4.  Movement examination: Tone: There is min increased tone in the RUE Abnormal movements: none even with distraction in the hands/legs.  Has some mouth tremor Coordination:  There is decremation only with hand opening and closing on the R Gait and Station: The patient is able to arise without the use of her hands.  Pt has very antalgic gait (knees are varus).  She does not shuffle.  She does not turn en bloc.  She really is not short step.  I have reviewed and interpreted the following labs independently    Chemistry      Component Value Date/Time   NA 139 05/22/2022 0000   K 4.2 05/22/2022 0000   CL 101 05/22/2022 0000   CO2 27 05/22/2022 0000   BUN 24 05/22/2022 0000   CREATININE 0.77 05/22/2022 0000      Component Value Date/Time   CALCIUM 9.3 05/22/2022 0000   ALKPHOS 87 03/31/2017 0927   AST 21 05/22/2022 0000   ALT 12 05/22/2022 0000   BILITOT 0.5 05/22/2022 0000       Lab Results  Component Value Date  WBC 8.8 05/22/2022   HGB 12.3 05/22/2022   HCT 37.9 05/22/2022   MCV 90.9 05/22/2022   PLT 465 (H) 05/22/2022    Lab Results  Component Value Date   TSH 0.78 05/22/2022   Total time spent on today's visit was *** minutes, including both face-to-face time and nonface-to-face time.  Time included that spent on review of records (prior notes available to me/labs/imaging if pertinent), discussing treatment and goals, answering patient's questions and coordinating care.   Cc:  Unk Pinto, MD

## 2022-06-09 ENCOUNTER — Ambulatory Visit: Payer: Medicare HMO | Admitting: Neurology

## 2022-06-11 ENCOUNTER — Ambulatory Visit (HOSPITAL_COMMUNITY)
Admission: RE | Admit: 2022-06-11 | Discharge: 2022-06-11 | Disposition: A | Discharge status: Home / Self Care | Payer: Medicare HMO | Source: Ambulatory Visit | Attending: Nurse Practitioner | Admitting: Nurse Practitioner

## 2022-06-11 ENCOUNTER — Encounter: Payer: Self-pay | Admitting: Nurse Practitioner

## 2022-06-11 ENCOUNTER — Ambulatory Visit (INDEPENDENT_AMBULATORY_CARE_PROVIDER_SITE_OTHER): Payer: Medicare HMO | Admitting: Nurse Practitioner

## 2022-06-11 VITALS — BP 130/60 | HR 60 | Temp 97.3°F | Ht 64.0 in | Wt 167.0 lb

## 2022-06-11 DIAGNOSIS — W101XXA Fall (on)(from) sidewalk curb, initial encounter: Secondary | ICD-10-CM

## 2022-06-11 DIAGNOSIS — R600 Localized edema: Secondary | ICD-10-CM | POA: Insufficient documentation

## 2022-06-11 DIAGNOSIS — R58 Hemorrhage, not elsewhere classified: Secondary | ICD-10-CM

## 2022-06-11 DIAGNOSIS — S0990XA Unspecified injury of head, initial encounter: Secondary | ICD-10-CM | POA: Diagnosis not present

## 2022-06-11 DIAGNOSIS — I6782 Cerebral ischemia: Secondary | ICD-10-CM | POA: Diagnosis not present

## 2022-06-11 DIAGNOSIS — S0511XA Contusion of eyeball and orbital tissues, right eye, initial encounter: Secondary | ICD-10-CM | POA: Diagnosis not present

## 2022-06-11 NOTE — Progress Notes (Signed)
Assessment and Plan:  Gwendolyn [name] was seen today for an episodic visit.  Diagnoses and all order for this visit:  1. Accidental fall involving sidewalk curb, initial encounter STAT   - CT HEAD WO CONTRAST (5MM); Future  2. Recent head trauma, initial encounter Stop bASA, NSAID Take Tylenol for pain.  - CT HEAD WO CONTRAST (5MM); Future  3. Ecchymosis  - CT HEAD WO CONTRAST (5MM); Future  4. Localized edema Ice to head and right knee PRN. RICE  method right knee. Continue follow up with Dr. Alvan Dame, Orthopedics next [facility] 06/19/22.  - CT HEAD WO CONTRAST (5MM); Future  Continue to monitor for any increase in HA, vision changes, altered mental status, dizziness, syncope, seizure like activity.  Call 911 or report to ER for further review and evaluation.   Notify office for further evaluation and treatment, questions or concerns if s/s fail to improve. The risks and benefits of my recommendations, as well as other treatment options were discussed with the patient today. Questions were answered.  Further disposition pending results of labs. Discussed med's effects and SE's.    Over 20 minutes of exam, counseling, chart review, and critical decision making was performed.   Future Appointments  Date Time Provider [place]  06/25/2022 10:45 AM Tat, Eustace Quail, DO [facility] None  08/26/2022 10:30 AM Alycia Rossetti, NP [facility] None  12/23/2022  3:00 PM Unk Pinto, MD [facility] None  03/24/2023  9:30 AM Alycia Rossetti, NP [facility] None    ------------------------------------------------------------------------------------------------------------------   HPI BP 130/60   Pulse 60   Temp (!) 97.3 F (36.3 C)   Ht '5\' 4"'$  (1.626 m)   Wt 167 lb (75.8 kg)   SpO2 97%   BMI 28.67 kg/m    81 y.o.female with PMH significant for parkinson's, glaucoma, htn, hyperlipidemia current on bASA and NSAIDs presents for evaluation of head trauma after trying to  step up on a curb, losing her balancing and falling forward hitting most notably the left side of her frontal lobe, left eye and left cheek area on a cement wall.  This occurred two days ago around 1100 am.  She denies any HA, dizziness, unstable gait,  vision changes, ear pain, drainage from ear, eye or nose.  She endorses swelling and mild pain along the right frontal area upon palpation only.  She uses a cane to walk.  She has taken NSAID for pain the last two days but has not yet taken her bASA today, as she takes in the evenings.  She has FROM in the head and neck area.    She also endorses an abrasion with mild ecchymosis on the right knee.  She reports hitting her head first and then her knee.  She has FROM in the knee only mild tenderness to palpation.    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  She was headed to PT for the TKA when the even occurred.  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    Vitamin D deficiency      Allergies  Allergen Reactions   Oxycodone-Acetaminophen Other (See Comments)    panic attack   Vit C-Cholecalciferol-Rose Hip [Cholecalciferol-Vitamin C] Other (See Comments)    dyspepsia   Ibuprofen Hives   Macrodantin [Nitrofurantoin Macrocrystal] Rash    Current Outpatient Medications on File Prior to Visit  Medication Sig   ALPRAZolam (XANAX) 0.5 MG tablet TAKE 1/2 TO 1 (ONE-HALF TO ONE) TABLET BY MOUTH THREE TIMES DAILY AS NEEDED FOR ANXIETY (Patient taking differently: Take 0.25 mg by mouth at bedtime as needed for sleep.)   ascorbic acid (VITAMIN C)  500 MG tablet Take 500 mg by mouth daily.   carbidopa-levodopa (SINEMET IR) 25-100 MG tablet TAKE ONE TABLET BY MOUTH THREE TIMES A DAY *TAKE AT 7AM, 11AM, AND 4PM*   CHELATED MAGNESIUM PO Take 250 mg by mouth 2 (two) times daily.   cholecalciferol (VITAMIN D3) 25 MCG (1000 UNIT) tablet Take 1,000 Units by mouth in the morning and at bedtime.   cyanocobalamin (VITAMIN B12) 1000 MCG tablet Take 1,000 mcg by mouth daily.   fexofenadine (ALLEGRA) 180 MG tablet Take 180 mg by mouth daily as needed for allergies or rhinitis.   furosemide (LASIX) 40 MG tablet Take 1 tablet (40 mg total) by mouth as needed for edema.   HYDROcodone-acetaminophen (NORCO) 7.5-325 MG tablet Take 1 tablet by mouth every 4 (four) hours as needed for severe pain.   losartan-hydrochlorothiazide (HYZAAR) 50-12.5 MG tablet TAKE ONE TABLET BY MOUTH DAILY FOR BLOOD PRESSURE   Omega-3 Fatty Acids (FISH OIL) 1000 MG CAPS Take 1,000 mg by mouth 2 (two) times daily.   pregabalin (LYRICA) 150 MG capsule Take  1 capsule  at Bedtime  for Sleep & Chronic Pain                                   /               TAKE                                   BY                              MOUTH   rosuvastatin (CRESTOR) 20 MG tablet Take 1 tablet MWF for Cholesterol (Patient taking differently: Take 20 mg by mouth every Monday, Wednesday, and Friday. Take 1 tablet MWF for Cholesterol)   timolol (TIMOPTIC) 0.5 % ophthalmic solution Place 1 drop into both eyes every morning.    zinc gluconate 50 MG tablet Take 50 mg by mouth daily.   docusate sodium (COLACE) 100 MG capsule Take 1 capsule (100 mg total) by mouth 2 (two) times daily.   methocarbamol (ROBAXIN) 500 MG tablet Take 1 tablet (500 mg total) by mouth every 6 (six) hours as needed for muscle spasms.   polyethylene glycol (MIRALAX / GLYCOLAX) 17 g packet Take 17 g by mouth daily as needed for mild constipation.   No current facility-administered medications on file prior to visit.    ROS: all  negative except what is noted in the HPI.   Physical Exam:  BP 130/60   Pulse 60   Temp (!) 97.3 F (36.3 C)   Ht '5\' 4"'$  (1.626 m)  Wt 167 lb (75.8 kg)   SpO2 97%   BMI 28.67 kg/m   General Appearance: NAD.  Awake, conversant and cooperative. Eyes: Ecchymosis, edema.  PERRLA, EOMs intact.  Sclera white.  Conjunctiva without erythema. Sinuses: Right frontal/maxillary tenderness.  No nasal discharge. Nares patent.  ENT/Mouth: Ext aud canals clear.  Bilateral TMs w/DOL and without erythema or bulging. Hearing intact.  Posterior pharynx without swelling or exudate.  Tonsils without swelling or erythema.  Neck: Supple.  No masses, nodules or thyromegaly. Respiratory: Effort is regular with non-labored breathing. Breath sounds are equal bilaterally without rales, rhonchi, wheezing or stridor.  Cardio: RRR with no MRGs. Brisk peripheral pulses without edema.  Abdomen: Active BS in all four quadrants.  Soft and non-tender without guarding, rebound tenderness, hernias or masses. Lymphatics: Non tender without lymphadenopathy.  Musculoskeletal: Right knee with mild scattered superficial abrasion, ecchymosis to lateral knee, no tenderness to palpation.  Full ROM, 5/5 strength, normal ambulation.  No clubbing or cyanosis. Skin: Appropriate color for ethnicity. Warm without rashes, lesions, ecchymosis, ulcers.  Neuro: CN II-XII grossly normal. Normal muscle tone without cerebellar symptoms and intact sensation.   Psych: AO X 3,  appropriate mood and affect, insight and judgment.     Darrol Jump, NP 10:57 AM [facility]

## 2022-06-15 DIAGNOSIS — M25562 Pain in left knee: Secondary | ICD-10-CM | POA: Diagnosis not present

## 2022-06-17 DIAGNOSIS — M25562 Pain in left knee: Secondary | ICD-10-CM | POA: Diagnosis not present

## 2022-06-19 DIAGNOSIS — H401131 Primary open-angle glaucoma, bilateral, mild stage: Secondary | ICD-10-CM | POA: Diagnosis not present

## 2022-06-19 DIAGNOSIS — Z471 Aftercare following joint replacement surgery: Secondary | ICD-10-CM | POA: Diagnosis not present

## 2022-06-19 DIAGNOSIS — Z96652 Presence of left artificial knee joint: Secondary | ICD-10-CM | POA: Diagnosis not present

## 2022-06-23 DIAGNOSIS — M25562 Pain in left knee: Secondary | ICD-10-CM | POA: Diagnosis not present

## 2022-06-23 NOTE — Progress Notes (Unsigned)
Assessment/Plan:   1.  Parkinsons Disease  -DaTscan was abnormal November 25, 2021 with decreased radiotracer activity bilaterally, right greater than left.  -Continue carbidopa/levodopa 25/100, 1 tablet 3 times per day.  2.  B12 deficiency  -Now on oral supplementation.  3.  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  -Patient does have mild evidence of peripheral neuropathy, which could explain the paresthesias of her toes.  This does not, however, explain her gait, nor does Parkinson's disease.  4.  Gait abnormality  -following with ortho for legs.  This looks like an ortho issue.  Told her I want her to f/u with dr. Alvan Dame as her gait abnormality is not explained by Parkinsons Disease.  She has no back pain.     Subjective:   Gwendolyn [name] was seen today in follow up for Parkinsons disease.  My previous records were reviewed prior to todays visit as well as outside records available to me.   Pt denies lightheadedness, near syncope.  No hallucinations.  Mood has been good.  She had left knee replacement surgery by Dr. Alvan Dame on August 29.  Notes are reviewed.  Unfortunately, the patient fell earlier this month after trying to step up on a curb.  She lost her balance and fell backward and hit the left side of her head.  CT brain was completed on October 12 and demonstrated mild chronic small vessel disease and soft tissue swelling in the right frontal scalp and lateral periorbital region.  There is also apparently scattered calcified scalp lesions (out of my area of expertise).  Current prescribed movement disorder medications: Carbidopa/levodopa 25/100, 1 tablet 3 times per day.   ALLERGIES:   Allergies  Allergen Reactions   Oxycodone-Acetaminophen Other (See Comments)    panic attack   Vit C-Cholecalciferol-Rose Hip [Cholecalciferol-Vitamin C] Other (See Comments)    dyspepsia   Ibuprofen Hives   Macrodantin [Nitrofurantoin Macrocrystal] Rash    CURRENT MEDICATIONS:  No outpatient  medications have been marked as taking for the 06/25/22 encounter (Appointment) with Klaira Pesci, Eustace Quail, DO.     Objective:   PHYSICAL EXAMINATION:    VITALS:   There were no vitals filed for this visit.   GEN:  The patient appears stated age and is in NAD. HEENT:  Normocephalic, atraumatic.  The mucous membranes are moist. The superficial temporal arteries are without ropiness or tenderness. CV:  RRR Lungs:  CTAB Neck/HEME:  There are no carotid bruits bilaterally.  Neurological examination:  Orientation: The patient is alert and oriented x3. Cranial nerves: There is good facial symmetry without facial hypomimia. The speech is fluent and clear. Soft palate rises symmetrically and there is no tongue deviation. Hearing is intact to conversational tone. Sensation: Sensation is intact to light touch throughout Motor: Strength is at least antigravity x4.  Movement examination: Tone: There is min increased tone in the RUE Abnormal movements: none even with distraction in the hands/legs.  Has some mouth tremor Coordination:  There is decremation only with hand opening and closing on the R Gait and Station: The patient is able to arise without the use of her hands.  Pt has very antalgic gait (knees are varus).  She does not shuffle.  She does not turn en bloc.  She really is not short step.  I have reviewed and interpreted the following labs independently    Chemistry      Component Value Date/Time   NA 139 05/22/2022 0000   K 4.2 05/22/2022 0000   CL  101 05/22/2022 0000   CO2 27 05/22/2022 0000   BUN 24 05/22/2022 0000   CREATININE 0.77 05/22/2022 0000      Component Value Date/Time   CALCIUM 9.3 05/22/2022 0000   ALKPHOS 87 03/31/2017 0927   AST 21 05/22/2022 0000   ALT 12 05/22/2022 0000   BILITOT 0.5 05/22/2022 0000       Lab Results  Component Value Date   WBC 8.8 05/22/2022   HGB 12.3 05/22/2022   HCT 37.9 05/22/2022   MCV 90.9 05/22/2022   PLT 465 (H) 05/22/2022     Lab Results  Component Value Date   TSH 0.78 05/22/2022   Total time spent on today's visit was *** minutes, including both face-to-face time and nonface-to-face time.  Time included that spent on review of records (prior notes available to me/labs/imaging if pertinent), discussing treatment and goals, answering patient's questions and coordinating care.   Cc:  Unk Pinto, MD

## 2022-06-25 ENCOUNTER — Ambulatory Visit: Payer: Medicare HMO | Admitting: Neurology

## 2022-06-25 ENCOUNTER — Encounter: Payer: Self-pay | Admitting: Neurology

## 2022-06-25 VITALS — BP 116/72 | HR 73 | Ht 63.0 in | Wt 169.6 lb

## 2022-06-25 DIAGNOSIS — G20A1 Parkinson's disease without dyskinesia, without mention of fluctuations: Secondary | ICD-10-CM

## 2022-06-25 NOTE — Patient Instructions (Signed)
Local and Online Resources for Power over Parkinson's Group  October 2023    LOCAL [place] PARKINSON'S GROUPS   Power over Parkinson's Group:    Power Over Parkinson's Patient Education Group will be Wednesday, October 11th-*Hybrid meting*- in person at [facility] location and via [facility] at 2 pm.   Upcoming Power over [employer] Meetings:  2nd Wednesdays of the month at 2 pm:   October 11th, November 8th, December 13th  Contact Amy Marriott at [email or web address] if interested in participating in this group    [place]! Moves [employer] Instructor-Led Classes offering at [employer]!  TUESDAYS and Wednesdays 1-2 pm.   Contact Vonna Kotyk at  [email or web address] or Caron Presume at [place], [email or web address]  Dance for Parkinson 's classes will be on Tuesdays 9:30am-10:30am starting October 3-December 12 with a break the week of November 21st. Located in the [employer]  which is in the first floor of the [employer] ([place] To register:  [email or web address] or [phone]  Drumming for Parkinson's will be held on 2nd and 4th Mondays at 11:00 am.   Located at the [place] ([place]  [place] at [email or web address] or [phone]  Through support from the [place] for Parkinson's classes are free for both patients and caregivers.    [employer] Parkinson's Tai Chi Class, Mondays at 11 am.  Call [phone] for details   [place]:  [email or web address]  PD Health at Home continues:  Mindfulness Mondays, Wellness Wednesdays, Fitness Fridays   Upcoming Education:    Parkinson's 101:  What you and your family should know.  Wednesday, Oct. 4th 1-2 pm  Expert Briefing:     Parkinson's and the Gut-Brain Connection.   Wednesday, Oct. 11th 1-2 pm  Hallucinations and Delusions in Parkinson's.  Wednesday, Nov. 8th, 1-2 pm  Register for expert briefings (webinars) at [email or web address]  Please check out their website to sign up for emails and see their full online offerings      [place]:  [email or web address]   Third Thursday Webinars:  On the third Thursday of every month at 12 p.m. ET, join our free live webinars to learn about various aspects of living with Parkinson's disease and our work to speed medical breakthroughs.  Upcoming Webinar:  Surveyor, mining for [employer]. (Replay).  Thursday, Oct. 12th at 12 noon  Check out additional information on their website to see their full online offerings    [employer]:  [email or web address]  Upcoming Webinar:   Stay tuned  Webinar Series:  Living with Parkinson's Meetup.   Third Thursdays each month, 3 pm  Care Partner Monthly Meetup.  With Robin Searing Phinney.  First Tuesday of each month, 2 pm  Check out additional information to Live Well Today on their website    Parkinson and Movement Disorders (PMD) Alliance:  www.[email or web address]  [employer]uroLife Online:  Online Education Events  Sign up for emails, which are sent weekly to give you updates on programming and online offerings    Parkinson's Association of the [place]:  www.[email or web address]  Information on online support groups, education events, and online exercises including Yoga, Parkinson's exercises and more-LOTS of information on links to PD resources and online events  Virtual Support Group through Parkinson's Association of the [place]; next one is scheduled for Wednesday, October 4th at 2 pm.  (  These are typically scheduled for the 1st Wednesday of the month at 2 pm).  Visit website for details.   MOVEMENT AND EXERCISE OPPORTUNITIES  PWR! Moves Classes  at [place].  Wednesdays 10 and 11 am.   Contact Amy Marriott, PT [email or web address] if interested.  NEW PWR! Moves Class offerings at [employer].  *TUESDAYS* and Wednesdays 1-2 pm.  Contact Vonna Kotyk at  [email or web address] or Caron Presume at [place],  [email or web address]  Parkinson's Wellness Recovery (PWR! Moves)  www.[email or web address]  Info on the PWR! Virtual Experience:  You will have access to our expertise?through self-assessment, guided plans that start with the PD-specific fundamentals, educational content, tips, Q&A with an expert, and a growing Art therapist of PD-specific pre-recorded and live exercise classes of varying types and intensity - both physical and cognitive! If that is not enough, we offer 1:1 wellness consultations (in-person or virtual) to personalize your PWR! Research scientist (medical).   [place] Fridays:   As part of the PD Health @ Home program, this free video series focuses each week on one aspect of fitness designed to support people living with Parkinson's.? These weekly videos highlight the [place] fitness guidelines for people with Parkinson's disease.  [email or web address]  Dance for PD website is offering free, live-stream classes throughout the week, as well as links to [employer] of classes:  [email or web address]  Virtual dance and Pilates for Parkinson's classes: Click on the Community Tab> Parkinson's Movement Initiative Tab.  To register for classes and for more information, visit [email or web address] and click the "community" tab.   [employer] Parkinson's Cycling Classes   [employer]:  Thursdays @ Noon-Live classes at [employer] ([employer] at [email or web address]?or [phone])  [employer]: Virtual Classes Mondays and Thursdays Jeanette Caprice classes Tuesday, Wednesday and Thursday (contact [place] at  [email or web address] ?or [phone])  [place]  Varied levels of classes are offered Tuesdays and Thursdays at [employer].   Stretching with Verdis Frederickson weekly class is also offered for people with Parkinson's  To observe a class or for more information, call [phone] or email Hezzie Bump at [email or web address]   ADDITIONAL SUPPORT AND RESOURCES  Well[employer]Spring [employer]:Online Caregiver Education Opportunities:  www.well-[email or web address]/caregiver-education/[email or web address]-support-group.  You may also contact Vickki Muff at [email or web address] or [phone].     Well-Spring Navigator:  Just1Navigator program, a?free service to help individuals and families through the journey of determining care for older adults.  The "Navigator" is a Education officer, museum, Arnell Asal, who will speak with a prospective client and/or loved ones to provide an assessment of the situation and a set of recommendations for a personalized care plan -- all free of charge, and whether?Well-Spring [employer] offers the needed service or not. If the need is not a service we provide, we are well-connected with reputable programs in town that we can refer you to.  www.well-springsolutions.org or to speak with the Navigator, call [phone].

## 2022-06-26 DIAGNOSIS — M25562 Pain in left knee: Secondary | ICD-10-CM | POA: Diagnosis not present

## 2022-06-29 DIAGNOSIS — M25562 Pain in left knee: Secondary | ICD-10-CM | POA: Diagnosis not present

## 2022-07-01 DIAGNOSIS — M25562 Pain in left knee: Secondary | ICD-10-CM | POA: Diagnosis not present

## 2022-07-06 DIAGNOSIS — M25562 Pain in left knee: Secondary | ICD-10-CM | POA: Diagnosis not present

## 2022-07-08 DIAGNOSIS — M6281 Muscle weakness (generalized): Secondary | ICD-10-CM | POA: Diagnosis not present

## 2022-07-08 DIAGNOSIS — R2689 Other abnormalities of gait and mobility: Secondary | ICD-10-CM | POA: Diagnosis not present

## 2022-07-13 DIAGNOSIS — R2689 Other abnormalities of gait and mobility: Secondary | ICD-10-CM | POA: Diagnosis not present

## 2022-07-13 DIAGNOSIS — M6281 Muscle weakness (generalized): Secondary | ICD-10-CM | POA: Diagnosis not present

## 2022-07-15 DIAGNOSIS — R2689 Other abnormalities of gait and mobility: Secondary | ICD-10-CM | POA: Diagnosis not present

## 2022-07-15 DIAGNOSIS — M6281 Muscle weakness (generalized): Secondary | ICD-10-CM | POA: Diagnosis not present

## 2022-07-20 DIAGNOSIS — R2689 Other abnormalities of gait and mobility: Secondary | ICD-10-CM | POA: Diagnosis not present

## 2022-07-20 DIAGNOSIS — M6281 Muscle weakness (generalized): Secondary | ICD-10-CM | POA: Diagnosis not present

## 2022-07-29 DIAGNOSIS — R2689 Other abnormalities of gait and mobility: Secondary | ICD-10-CM | POA: Diagnosis not present

## 2022-07-29 DIAGNOSIS — M6281 Muscle weakness (generalized): Secondary | ICD-10-CM | POA: Diagnosis not present

## 2022-07-31 DIAGNOSIS — Z96643 Presence of artificial hip joint, bilateral: Secondary | ICD-10-CM | POA: Diagnosis not present

## 2022-07-31 DIAGNOSIS — Z96652 Presence of left artificial knee joint: Secondary | ICD-10-CM | POA: Diagnosis not present

## 2022-07-31 DIAGNOSIS — M25551 Pain in right hip: Secondary | ICD-10-CM | POA: Diagnosis not present

## 2022-07-31 DIAGNOSIS — Z471 Aftercare following joint replacement surgery: Secondary | ICD-10-CM | POA: Diagnosis not present

## 2022-08-04 ENCOUNTER — Encounter: Payer: Self-pay | Admitting: Nurse Practitioner

## 2022-08-04 NOTE — Telephone Encounter (Signed)
Please call pt to schedule appt with [facility] tomorrow

## 2022-08-06 ENCOUNTER — Encounter: Payer: Self-pay | Admitting: Nurse Practitioner

## 2022-08-06 ENCOUNTER — Ambulatory Visit (INDEPENDENT_AMBULATORY_CARE_PROVIDER_SITE_OTHER): Payer: Medicare HMO | Admitting: Nurse Practitioner

## 2022-08-06 VITALS — BP 122/70 | HR 78 | Temp 97.3°F | Resp 16 | Ht 63.0 in | Wt 170.4 lb

## 2022-08-06 DIAGNOSIS — I10 Essential (primary) hypertension: Secondary | ICD-10-CM

## 2022-08-06 DIAGNOSIS — M545 Low back pain, unspecified: Secondary | ICD-10-CM | POA: Diagnosis not present

## 2022-08-06 DIAGNOSIS — R350 Frequency of micturition: Secondary | ICD-10-CM

## 2022-08-06 MED ORDER — AMOXICILLIN 500 MG PO TABS: 500.0000 mg | ORAL_TABLET | Freq: Two times a day (BID) | ORAL | 0 refills | Status: AC

## 2022-08-06 NOTE — Progress Notes (Signed)
Assessment and Plan:  Gwendolyn [name] was seen today for urinary tract infection.  Diagnoses and all orders for this visit:  Essential hypertension - continue medications, DASH diet, exercise and monitor at home. Call if greater than 130/80.   Urinary frequency Push fluids If culture is not sensitive to Amoxicillin will change antibiotic If develop severe back pain, fever, abdominal pain, nausea/vomiting she is to go to the ER -     amoxicillin (AMOXIL) 500 MG tablet; Take 1 tablet (500 mg total) by mouth 2 (two) times daily for 7 days. 10 days -     Urinalysis, Routine w reflex microscopic -     Urine Culture  Acute low back pain without sciatica, unspecified back pain laterality Continue PT and follow with orthopedics       Further disposition pending results of labs. Discussed med's effects and SE's.   Over 30 minutes of exam, counseling, chart review, and critical decision making was performed.   Future Appointments  Date Time Provider [place]  08/26/2022 10:30 AM Alycia Rossetti, NP [facility] None  12/23/2022  3:00 PM Unk Pinto, MD [facility] None  12/29/2022  9:15 AM Tat, Eustace Quail, DO [facility] None  03/24/2023  9:30 AM Alycia Rossetti, NP [facility] None    ------------------------------------------------------------------------------------------------------------------   HPI BP 122/70   Pulse 78   Temp (!) 97.3 F (36.3 C)   Resp 16   Ht '5\' 3"'$  (1.6 m)   Wt 170 lb 6.4 oz (77.3 kg)   SpO2 95%   BMI 30.19 kg/m   81 y.o.female presents for  frequency of urination and is only voiding small amounts. She also has lower abdominal pressure.  Left knee replacement done 03/2022- still walking with a cane and doing PT. Has some low back pain , has been compensating with her walking and swinging out right leg.    Bp is currently well controlled with Hyzaar 50/12.5 mg daily.  Denies headaches, chest pain, shortness of breath and dizziness. 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   carbidopa-levodopa (SINEMET IR) 25-100 MG tablet TAKE ONE TABLET BY MOUTH THREE TIMES A DAY *TAKE AT 7AM, 11AM, AND 4PM*   CHELATED MAGNESIUM PO Take 250 mg by mouth 2 (two) times daily.   cholecalciferol (VITAMIN D3) 25 MCG (1000 UNIT) tablet Take 1,000 Units by mouth in the morning and at bedtime.   cyanocobalamin (VITAMIN  B12) 1000 MCG tablet Take 1,000 mcg by mouth daily.   fexofenadine (ALLEGRA) 180 MG tablet Take 180 mg by mouth daily as needed for allergies or rhinitis.   furosemide (LASIX) 40 MG tablet Take 1 tablet (40 mg total) by mouth as needed for edema.   losartan-hydrochlorothiazide (HYZAAR) 50-12.5 MG tablet TAKE ONE TABLET BY MOUTH DAILY FOR BLOOD PRESSURE   Omega-3 Fatty Acids (FISH OIL) 1000 MG CAPS Take 1,000 mg by mouth 2 (two) times daily.   pregabalin (LYRICA) 150 MG capsule Take  1 capsule  at Bedtime  for Sleep & Chronic Pain                                   /               TAKE                                    BY                              MOUTH   rosuvastatin (CRESTOR) 20 MG tablet Take 1 tablet MWF for Cholesterol (Patient taking differently: Take 20 mg by mouth every Monday, Wednesday, and Friday. Take 1 tablet MWF for Cholesterol)   timolol (TIMOPTIC) 0.5 % ophthalmic solution Place 1 drop into both eyes every morning.    zinc gluconate 50 MG tablet Take 50 mg by mouth daily.   ALPRAZolam (XANAX) 0.5 MG tablet TAKE 1/2 TO 1 (ONE-HALF TO ONE) TABLET BY MOUTH THREE TIMES DAILY AS NEEDED FOR ANXIETY (Patient not taking: Reported on 06/25/2022)   No current facility-administered medications on file prior to visit.    ROS: all negative except above.   Physical Exam:  BP 122/70   Pulse 78   Temp (!) 97.3 F (36.3 C)   Resp 16   Ht '5\' 3"'$  (1.6 m)   Wt 170 lb 6.4 oz (77.3 kg)   SpO2 95%   BMI 30.19 kg/m   General Appearance: Well nourished, in no apparent distress. Eyes: PERRLA, EOMs, conjunctiva no swelling or erythema Sinuses: No Frontal/maxillary tenderness ENT/Mouth: Ext aud canals clear, TMs without erythema, bulging. No erythema, swelling, or exudate on post pharynx.  Tonsils not swollen or erythematous. Hearing normal.  Neck: Supple, thyroid normal.  Respiratory: Respiratory effort normal, BS equal bilaterally without rales, rhonchi, wheezing or stridor.  Cardio: RRR with no MRGs. Brisk peripheral pulses without edema.  Abdomen: Soft, + BS.  Non tender, no guarding, rebound, hernias, masses. Lymphatics: Non tender without lymphadenopathy.  Musculoskeletal: Full ROM, antalgic gait, swings right leg out- using cane. Negative CVA tenderness. Mild lower back tenderness to palpation and muscle tension noted bilaterally in lower back Skin: Warm, dry without rashes, lesions, ecchymosis.  Neuro: Cranial nerves intact. Normal muscle tone, no cerebellar symptoms. Sensation intact.  Psych: Awake and oriented X 3, normal affect, Insight and Judgment appropriate.     Alycia Rossetti,  NP 9:55 AM [facility]

## 2022-08-06 NOTE — Patient Instructions (Signed)
Urinary Tract Infection, Adult A urinary tract infection (UTI) is an infection of any part of the urinary tract. The urinary tract includes: The kidneys. The ureters. The bladder. The urethra. These organs make, store, and get rid of pee (urine) in the body. What are the causes? This infection is caused by germs (bacteria) in your genital area. These germs grow and cause swelling (inflammation) of your urinary tract. What increases the risk? The following factors may make you more likely to develop this condition: Using a small, thin tube (catheter) to drain pee. Not being able to control when you pee or poop (incontinence). Being female. If you are female, these things can increase the risk: Using these methods to prevent pregnancy: A medicine that kills sperm (spermicide). A device that blocks sperm (diaphragm). Having low levels of a female hormone (estrogen). Being pregnant. You are more likely to develop this condition if: You have genes that add to your risk. You are sexually active. You take antibiotic medicines. You have trouble peeing because of: A prostate that is bigger than normal, if you are female. A blockage in the part of your body that drains pee from the bladder. A kidney stone. A nerve condition that affects your bladder. Not getting enough to drink. Not peeing often enough. You have other conditions, such as: Diabetes. A weak disease-fighting system (immune system). Sickle cell disease. Gout. Injury of the spine. What are the signs or symptoms? Symptoms of this condition include: Needing to pee right away. Peeing small amounts often. Pain or burning when peeing. Blood in the pee. Pee that smells bad or not like normal. Trouble peeing. Pee that is cloudy. Fluid coming from the vagina, if you are female. Pain in the belly or lower back. Other symptoms include: Vomiting. Not feeling hungry. Feeling mixed up (confused). This may be the first symptom in  older adults. Being tired and grouchy (irritable). A fever. Watery poop (diarrhea). How is this treated? Taking antibiotic medicine. Taking other medicines. Drinking enough water. In some cases, you may need to see a specialist. Follow these instructions at home:  Medicines Take over-the-counter and prescription medicines only as told by your doctor. If you were prescribed an antibiotic medicine, take it as told by your doctor. Do not stop taking it even if you start to feel better. General instructions Make sure you: Pee until your bladder is empty. Do not hold pee for a long time. Empty your bladder after sex. Wipe from front to back after peeing or pooping if you are a female. Use each tissue one time when you wipe. Drink enough fluid to keep your pee pale yellow. Keep all follow-up visits. Contact a doctor if: You do not get better after 1-2 days. Your symptoms go away and then come back. Get help right away if: You have very bad back pain. You have very bad pain in your lower belly. You have a fever. You have chills. You feeling like you will vomit or you vomit. Summary A urinary tract infection (UTI) is an infection of any part of the urinary tract. This condition is caused by germs in your genital area. There are many risk factors for a UTI. Treatment includes antibiotic medicines. Drink enough fluid to keep your pee pale yellow. This information is not intended to replace advice given to you by your health care provider. Make sure you discuss any questions you have with your health care provider. Document Revised: 03/29/2020 Document Reviewed: 03/29/2020 Elsevier Patient Education    2023 Elsevier Inc.  

## 2022-08-07 DIAGNOSIS — R2689 Other abnormalities of gait and mobility: Secondary | ICD-10-CM | POA: Diagnosis not present

## 2022-08-07 DIAGNOSIS — M6281 Muscle weakness (generalized): Secondary | ICD-10-CM | POA: Diagnosis not present

## 2022-08-09 ENCOUNTER — Other Ambulatory Visit: Payer: Self-pay | Admitting: Nurse Practitioner

## 2022-08-09 LAB — URINE CULTURE
MICRO NUMBER:: 14285187
SPECIMEN QUALITY:: ADEQUATE

## 2022-08-09 LAB — URINALYSIS, ROUTINE W REFLEX MICROSCOPIC
Bilirubin Urine: NEGATIVE
Glucose, UA: NEGATIVE
Hgb urine dipstick: NEGATIVE
Hyaline Cast: NONE SEEN /LPF
Ketones, ur: NEGATIVE
Nitrite: NEGATIVE
Specific Gravity, Urine: 1.016 (ref 1.001–1.035)
pH: 5.5 (ref 5.0–8.0)

## 2022-08-09 LAB — MICROSCOPIC MESSAGE

## 2022-08-09 MED ORDER — SULFAMETHOXAZOLE-TRIMETHOPRIM 400-80 MG PO TABS: 1.0000 | ORAL_TABLET | Freq: Two times a day (BID) | ORAL | 0 refills | Status: AC

## 2022-08-11 DIAGNOSIS — M6281 Muscle weakness (generalized): Secondary | ICD-10-CM | POA: Diagnosis not present

## 2022-08-11 DIAGNOSIS — R2689 Other abnormalities of gait and mobility: Secondary | ICD-10-CM | POA: Diagnosis not present

## 2022-08-12 ENCOUNTER — Encounter: Payer: Self-pay | Admitting: Internal Medicine

## 2022-08-14 DIAGNOSIS — R2689 Other abnormalities of gait and mobility: Secondary | ICD-10-CM | POA: Diagnosis not present

## 2022-08-14 DIAGNOSIS — M6281 Muscle weakness (generalized): Secondary | ICD-10-CM | POA: Diagnosis not present

## 2022-08-18 DIAGNOSIS — M6281 Muscle weakness (generalized): Secondary | ICD-10-CM | POA: Diagnosis not present

## 2022-08-18 DIAGNOSIS — R2689 Other abnormalities of gait and mobility: Secondary | ICD-10-CM | POA: Diagnosis not present

## 2022-08-19 ENCOUNTER — Other Ambulatory Visit: Payer: Self-pay | Admitting: Neurology

## 2022-08-19 DIAGNOSIS — G20A1 Parkinson's disease without dyskinesia, without mention of fluctuations: Secondary | ICD-10-CM

## 2022-08-20 NOTE — Progress Notes (Signed)
FOLLOW UP  Assessment and Plan:   Essential Hypertension Well controlled with current medications  Monitor blood pressure at home; patient to call if consistently greater than 130/80 Continue DASH diet.   Reminder to go to the ER if any CP, SOB, nausea, dizziness, severe HA, changes vision/speech, left arm numbness and tingling and jaw pain. -CBC  Hyperlipidemia Currently above goal; taking crestor 20 mg three times a week  Continue low cholesterol diet and exercise.  Check lipid panel.   Abnormal glucose Recent A1Cs at goal Continue diet and exercise.  Perform daily foot/skin check, notify office of any concerning changes.  Defer A1C; check CMP  Overweight  Long discussion about weight loss, diet, and exercise Recommended diet heavy in fruits and veggies and low in animal meats, cheeses, and dairy products, appropriate calorie intake Discussed ideal weight for height and weight goal (145-150 lb) Will follow up in 3 months  Vitamin D Def At goal at last visit; continue supplementation to maintain goal of 70-100 Defer Vit D level  Medication Management - magnesium  Parkinson's Continue to follow with Dr. Carles Collet Continue PT Continue medications   Continue diet and meds as discussed. Further disposition pending results of labs. Discussed med's effects and SE's.   Over 30 minutes of exam, counseling, chart review, and critical decision making was performed.   Future Appointments  Date Time Provider [place]  12/23/2022  3:00 PM Unk Pinto, MD [facility] None  12/29/2022  9:15 AM Tat, Eustace Quail, DO [facility] None  03/24/2023  9:30 AM Alycia Rossetti, NP [facility] None    ----------------------------------------------------------------------------------------------------------------------  HPI 81 y.o. female  presents for 3 month follow up on hypertension, cholesterol, glucose management, weight, anxiety and vitamin D deficiency.    she has a diagnosis  of anxiety and is currently on xanax 0.25-0.5 mg TID PRN insomnia, reports symptoms are well controlled on current regimen. Not currently on daily agent, reports she only needs very rarely, takes 0.25 mg 1-2 times a month.  Alprazolam 0.5 mg has not been filled in the past 2 years  Continues to do PT for balance, is doing exercises at home. She did fall one time last week when putting a snowman in the yard. No injury from the fall. She continues to see Dr. Carles Collet for Parkinsons. Continues to follow with Dr. Alvan Dame for orthopedics and right hip.  She does occasionally notice a pill rolling tremor, notices she doesn't swing her arms when she walks.   BMI is Body mass index is 30.36 kg/m., she has been working on diet and exercise. She is planning on working on diet in the new year.  Wt Readings from Last 3 Encounters:  08/26/22 171 lb 6.4 oz (77.7 kg)  08/06/22 170 lb 6.4 oz (77.3 kg)  06/25/22 169 lb 9.6 oz (76.9 kg)   Her blood pressure has been controlled at home, today their BP is BP: 128/60  BP Readings from Last 3 Encounters:  08/26/22 128/60  08/06/22 122/70  06/25/22 116/72  She does workout-has not been able to workout as much due to taking care of her boyfriend/significant other that is 55, she is doing yardwork. She denies chest pain, shortness of breath, dizziness.   She is on cholesterol medication (crestor 20 mg three times weekly ) and denies myalgias. Her cholesterol is at goal. The cholesterol last visit was:   Lab Results  Component Value Date   CHOL 191 05/22/2022   HDL 59 05/22/2022   LDLCALC 106 (  H) 05/22/2022   TRIG 138 05/22/2022   CHOLHDL 3.2 05/22/2022    She has been working on diet and exercise for glucose management, and denies increased appetite, nausea, paresthesia of the feet, polydipsia, polyuria, visual disturbances, vomiting and weight loss. Last A1C in the office was:  Lab Results  Component Value Date   HGBA1C 5.3 05/22/2022   Patient is on Vitamin D  supplement and at goal at most recent check:   Lab Results  Component Value Date   VD25OH 64 05/22/2022     She is drinking more water Lab Results  Component Value Date   EGFR 78 05/22/2022      Current Medications:  Current Outpatient Medications on File Prior to Visit  Medication Sig   ALPRAZolam (XANAX) 0.5 MG tablet TAKE 1/2 TO 1 (ONE-HALF TO ONE) TABLET BY MOUTH THREE TIMES DAILY AS NEEDED FOR ANXIETY   carbidopa-levodopa (SINEMET IR) 25-100 MG tablet TAKE ONE TABLET BY MOUTH THREE TIMES A DAY *AT 7AM, 11AM, AND 4PM*   CHELATED MAGNESIUM PO Take 250 mg by mouth 2 (two) times daily.   cholecalciferol (VITAMIN D3) 25 MCG (1000 UNIT) tablet Take 1,000 Units by mouth in the morning and at bedtime.   cyanocobalamin (VITAMIN B12) 1000 MCG tablet Take 1,000 mcg by mouth daily.   losartan-hydrochlorothiazide (HYZAAR) 50-12.5 MG tablet TAKE ONE TABLET BY MOUTH DAILY FOR BLOOD PRESSURE   Omega-3 Fatty Acids (FISH OIL) 1000 MG CAPS Take 1,000 mg by mouth 2 (two) times daily.   pregabalin (LYRICA) 150 MG capsule Take  1 capsule  at Bedtime  for Sleep & Chronic Pain                                   /               TAKE                                   BY                              MOUTH   rosuvastatin (CRESTOR) 20 MG tablet Take 1 tablet MWF for Cholesterol (Patient taking differently: Take 20 mg by mouth every Monday, Wednesday, and Friday. Take 1 tablet MWF for Cholesterol)   timolol (TIMOPTIC) 0.5 % ophthalmic solution Place 1 drop into both eyes every morning.    ascorbic acid (VITAMIN C) 500 MG tablet Take 500 mg by mouth daily. (Patient not taking: Reported on 08/26/2022)   fexofenadine (ALLEGRA) 180 MG tablet Take 180 mg by mouth daily as needed for allergies or rhinitis. (Patient not taking: Reported on 08/26/2022)   furosemide (LASIX) 40 MG tablet Take 1 tablet (40 mg total) by mouth as needed for edema. (Patient not taking: Reported on 08/26/2022)   zinc gluconate 50 MG tablet Take  50 mg by mouth daily. (Patient not taking: Reported on 08/26/2022)   No current facility-administered medications on file prior to visit.     Allergies:  Allergies  Allergen Reactions   Oxycodone-Acetaminophen Other (See Comments)    panic attack   Vit C-Cholecalciferol-Rose Hip [Cholecalciferol-Vitamin C] Other (See Comments)    dyspepsia   Ibuprofen Hives   Macrodantin [Nitrofurantoin Macrocrystal] Rash     Medical History:  Past Medical History:  Diagnosis Date   Allergy    Arthritis    Blood transfusion without reported diagnosis    Cataract    removed both eyes    Cervical radicular pain 02/11/2016   Glaucoma    low pressure glaucoma    Hyperlipidemia    Hypertension    Osteopenia    Parkinson's disease    Pelvis fracture ([place]) 03/26/2002   both sides fx   Positive colorectal cancer screening using Cologuard test 04/14/2017   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  HENT:  Negative for hearing loss and tinnitus.   Eyes:  Negative for blurred vision and double vision.  Respiratory:  Negative for cough, shortness of breath and wheezing.   Cardiovascular:  Negative for chest pain, palpitations, orthopnea, claudication and leg swelling.  Gastrointestinal:  Negative for abdominal pain, blood in stool, constipation, diarrhea, heartburn, melena, nausea and vomiting.  Genitourinary: Negative.   Musculoskeletal:  Negative for joint pain and myalgias.  Skin:  Negative for rash.  Neurological:  Positive for tremors (occasional pill rolling tremor of right hand). Negative for dizziness, tingling, sensory change, weakness and headaches.  Endo/Heme/Allergies:  Negative for polydipsia.  Psychiatric/Behavioral: Negative.    All other systems reviewed and are  negative.    Physical Exam: BP 128/60   Pulse 64   Temp (!) 97.5 F (36.4 C)   Ht _0  (1.6 m)   Wt 171 lb 6.4 oz (77.7 kg)   SpO2 98%   BMI 30.36 kg/m  Wt Readings from Last 3 Encounters:  08/26/22 171 lb 6.4 oz (77.7 kg)  08/06/22 170 lb 6.4 oz (77.3 kg)  06/25/22 169 lb 9.6 oz (76.9 kg)   General Appearance: Well nourished, in no apparent distress. Eyes: PERRLA, EOMs, conjunctiva no swelling or erythema Sinuses: No Frontal/maxillary tenderness ENT/Mouth: Ext aud canals clear, TMs without erythema, bulging. No erythema, swelling, or exudate on post pharynx.  Tonsils not swollen or erythematous. Hearing normal.  Neck: Supple, thyroid normal.  Respiratory: Respiratory effort normal, BS equal bilaterally without rales, rhonchi, wheezing or stridor.  Cardio: RRR with no MRGs. Brisk peripheral pulses without edema.  Abdomen: Soft, + BS.  Non tender, no guarding, rebound, hernias, masses. Lymphatics: Non tender without lymphadenopathy.  Musculoskeletal: Full ROM, 5/5 strength, Antalgic gait and swings left leg out whe walking, using cane Skin: Warm, dry without rashes, lesions, ecchymosis.  Neuro: Cranial nerves intact. Mild cog wheel rigidity of left arm> right arm  Psych: Awake and oriented X 3, normal affect, Insight and Judgment appropriate.    Alycia Rossetti, NP 10:59 AM [facility]

## 2022-08-21 DIAGNOSIS — M6281 Muscle weakness (generalized): Secondary | ICD-10-CM | POA: Diagnosis not present

## 2022-08-21 DIAGNOSIS — R2689 Other abnormalities of gait and mobility: Secondary | ICD-10-CM | POA: Diagnosis not present

## 2022-08-26 ENCOUNTER — Ambulatory Visit (INDEPENDENT_AMBULATORY_CARE_PROVIDER_SITE_OTHER): Payer: Medicare HMO | Admitting: Nurse Practitioner

## 2022-08-26 ENCOUNTER — Encounter: Payer: Self-pay | Admitting: Nurse Practitioner

## 2022-08-26 VITALS — BP 128/60 | HR 64 | Temp 97.5°F | Ht 63.0 in | Wt 171.4 lb

## 2022-08-26 DIAGNOSIS — E559 Vitamin D deficiency, unspecified: Secondary | ICD-10-CM

## 2022-08-26 DIAGNOSIS — I10 Essential (primary) hypertension: Secondary | ICD-10-CM | POA: Diagnosis not present

## 2022-08-26 DIAGNOSIS — E663 Overweight: Secondary | ICD-10-CM | POA: Diagnosis not present

## 2022-08-26 DIAGNOSIS — R7309 Other abnormal glucose: Secondary | ICD-10-CM | POA: Diagnosis not present

## 2022-08-26 DIAGNOSIS — E782 Mixed hyperlipidemia: Secondary | ICD-10-CM

## 2022-08-26 DIAGNOSIS — G20A1 Parkinson's disease without dyskinesia, without mention of fluctuations: Secondary | ICD-10-CM

## 2022-08-26 DIAGNOSIS — Z79899 Other long term (current) drug therapy: Secondary | ICD-10-CM | POA: Diagnosis not present

## 2022-08-26 NOTE — Patient Instructions (Signed)

## 2022-08-27 LAB — COMPLETE METABOLIC PANEL WITH GFR
AG Ratio: 1.3 (calc) (ref 1.0–2.5)
ALT: 24 U/L (ref 6–29)
AST: 43 U/L — ABNORMAL HIGH (ref 10–35)
Albumin: 3.9 g/dL (ref 3.6–5.1)
Alkaline phosphatase (APISO): 98 U/L (ref 37–153)
BUN/Creatinine Ratio: 34 (calc) — ABNORMAL HIGH (ref 6–22)
BUN: 27 mg/dL — ABNORMAL HIGH (ref 7–25)
CO2: 28 mmol/L (ref 20–32)
Calcium: 9.4 mg/dL (ref 8.6–10.4)
Chloride: 104 mmol/L (ref 98–110)
Creat: 0.79 mg/dL (ref 0.60–0.95)
Globulin: 2.9 g/dL (calc) (ref 1.9–3.7)
Glucose, Bld: 85 mg/dL (ref 65–99)
Potassium: 4.5 mmol/L (ref 3.5–5.3)
Sodium: 142 mmol/L (ref 135–146)
Total Bilirubin: 0.3 mg/dL (ref 0.2–1.2)
Total Protein: 6.8 g/dL (ref 6.1–8.1)
eGFR: 76 mL/min/{1.73_m2} (ref >=60)

## 2022-08-27 LAB — CBC WITH DIFFERENTIAL/PLATELET
Absolute Monocytes: 700 cells/uL (ref 200–950)
Basophils Absolute: 42 cells/uL (ref 0–200)
Basophils Relative: 0.6 %
Eosinophils Absolute: 189 cells/uL (ref 15–500)
Eosinophils Relative: 2.7 %
HCT: 38.6 % (ref 35.0–45.0)
Hemoglobin: 12.9 g/dL (ref 11.7–15.5)
Lymphs Abs: 1624 cells/uL (ref 850–3900)
MCH: 28.9 pg (ref 27.0–33.0)
MCHC: 33.4 g/dL (ref 32.0–36.0)
MCV: 86.5 fL (ref 80.0–100.0)
MPV: 11.5 fL (ref 7.5–12.5)
Monocytes Relative: 10 %
Neutro Abs: 4445 cells/uL (ref 1500–7800)
Neutrophils Relative %: 63.5 %
Platelets: 255 10*3/uL (ref 140–400)
RBC: 4.46 10*6/uL (ref 3.80–5.10)
RDW: 14.6 % (ref 11.0–15.0)
Total Lymphocyte: 23.2 %
WBC: 7 10*3/uL (ref 3.8–10.8)

## 2022-08-27 LAB — LIPID PANEL
Cholesterol: 177 mg/dL (ref <200)
HDL: 84 mg/dL (ref >=50)
LDL Cholesterol (Calc): 77 mg/dL (calc)
Non-HDL Cholesterol (Calc): 93 mg/dL (calc) (ref <130)
Total CHOL/HDL Ratio: 2.1 (calc) (ref <5.0)
Triglycerides: 76 mg/dL (ref <150)

## 2022-08-27 LAB — MAGNESIUM: Magnesium: 2.2 mg/dL (ref 1.5–2.5)

## 2022-08-28 DIAGNOSIS — M6281 Muscle weakness (generalized): Secondary | ICD-10-CM | POA: Diagnosis not present

## 2022-08-28 DIAGNOSIS — R2689 Other abnormalities of gait and mobility: Secondary | ICD-10-CM | POA: Diagnosis not present

## 2022-09-02 DIAGNOSIS — M6281 Muscle weakness (generalized): Secondary | ICD-10-CM | POA: Diagnosis not present

## 2022-09-02 DIAGNOSIS — R2689 Other abnormalities of gait and mobility: Secondary | ICD-10-CM | POA: Diagnosis not present

## 2022-09-10 DIAGNOSIS — R2689 Other abnormalities of gait and mobility: Secondary | ICD-10-CM | POA: Diagnosis not present

## 2022-09-10 DIAGNOSIS — M6281 Muscle weakness (generalized): Secondary | ICD-10-CM | POA: Diagnosis not present

## 2022-09-11 DIAGNOSIS — Z96652 Presence of left artificial knee joint: Secondary | ICD-10-CM | POA: Diagnosis not present

## 2022-09-11 DIAGNOSIS — Z96643 Presence of artificial hip joint, bilateral: Secondary | ICD-10-CM | POA: Diagnosis not present

## 2022-09-16 DIAGNOSIS — R2689 Other abnormalities of gait and mobility: Secondary | ICD-10-CM | POA: Diagnosis not present

## 2022-09-16 DIAGNOSIS — M6281 Muscle weakness (generalized): Secondary | ICD-10-CM | POA: Diagnosis not present

## 2022-09-17 ENCOUNTER — Encounter: Payer: Self-pay | Admitting: Nurse Practitioner

## 2022-09-17 NOTE — Progress Notes (Signed)
Assessment and Plan:  Diagnoses and all orders for this visit:  Essential hypertension - continue medications, DASH diet, exercise and monitor at home. Call if greater than 130/80.    Urinary urgency Continue pushing fluids Will await results of urine before treating as she believes the symptoms are improving.  -     Urine Culture -     Urinalysis, Routine w reflex microscopic       Further disposition pending results of labs. Discussed med's effects and SE's.   Over 30 minutes of exam, counseling, chart review, and critical decision making was performed.   Future Appointments  Date Time Provider [place]  09/18/2022 11:15 AM Alycia Rossetti, NP [facility] None  12/23/2022  3:00 PM Unk Pinto, MD [facility] None  12/29/2022  9:15 AM Tat, Eustace Quail, DO [facility] None  03/24/2023  9:30 AM Alycia Rossetti, NP [facility] None    ------------------------------------------------------------------------------------------------------------------   HPI BP 138/64   Pulse 73   Temp (!) 97.5 F (36.4 C)   Ht '5\' 3"'$  (1.6 m)   Wt 174 lb (78.9 kg)   SpO2 97%   BMI 30.82 kg/m   82 y.o.female presents for complaints of urinary frequency and urgency x 7 days.  Also has associated lower abdominal pressure and getting up 2-3 times a night to urinate. She has been drinking more water.   BP is well controlled with Hyzaar 50/12.5 mg and denies headaches, chest pain, shortness of breath and dizziness.  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   carbidopa-levodopa (SINEMET IR) 25-100 MG tablet TAKE ONE TABLET BY MOUTH THREE TIMES A DAY *AT 7AM, 11AM, AND 4PM*   CHELATED MAGNESIUM PO Take 250 mg by mouth 2 (two) times daily.   cholecalciferol (VITAMIN D3) 25 MCG (1000 UNIT) tablet Take 1,000 Units by mouth in the morning and at bedtime.   cyanocobalamin (VITAMIN B12) 1000 MCG tablet Take 1,000 mcg by mouth daily.   fexofenadine (ALLEGRA) 180 MG tablet Take 180 mg by mouth daily as needed for allergies or rhinitis.   furosemide (LASIX) 40 MG tablet Take 1 tablet (40 mg total) by mouth as needed for edema.   losartan-hydrochlorothiazide (HYZAAR) 50-12.5 MG tablet TAKE ONE TABLET BY MOUTH DAILY FOR BLOOD PRESSURE  Omega-3 Fatty Acids (FISH OIL) 1000 MG CAPS Take 1,000 mg by mouth 2 (two) times daily.   pregabalin (LYRICA) 150 MG capsule Take  1 capsule  at Bedtime  for Sleep & Chronic Pain                                   /               TAKE                                   BY                              MOUTH   rosuvastatin (CRESTOR) 20 MG tablet Take 1 tablet MWF for Cholesterol (Patient taking differently: Take 20 mg by mouth every Monday, Wednesday, and Friday. Take 1 tablet MWF for Cholesterol)   timolol (TIMOPTIC) 0.5 % ophthalmic solution Place 1 drop into both eyes every morning.    zinc  gluconate 50 MG tablet Take 50 mg by mouth daily.   No current facility-administered medications on file prior to visit.    ROS: all negative except above.   Physical Exam:  BP 138/64   Pulse 73   Temp (!) 97.5 F (36.4 C)   Ht '5\' 3"'$  (1.6 m)   Wt 174 lb (78.9 kg)   SpO2 97%   BMI 30.82 kg/m   General Appearance: Well nourished, in no apparent distress. Eyes: PERRLA, EOMs, conjunctiva no swelling or erythema Respiratory: Respiratory effort normal, BS equal bilaterally without rales, rhonchi, wheezing or stridor.  Cardio: RRR with no MRGs. Brisk peripheral pulses without edema.  Abdomen: Soft, + BS.  Non tender, no guarding, rebound, hernias, masses. Lymphatics: Non tender without lymphadenopathy.  Musculoskeletal: Full ROM, 5/5 strength, normal gait. Negative CVA tenderness Skin: Warm, dry without rashes, lesions, ecchymosis.  Neuro: Cranial nerves intact. Normal muscle tone, no cerebellar symptoms. Sensation intact.  Psych: Awake and oriented X 3, normal affect, Insight and Judgment appropriate.     Alycia Rossetti, NP 11:05 AM [facility]

## 2022-09-18 ENCOUNTER — Encounter: Payer: Self-pay | Admitting: Nurse Practitioner

## 2022-09-18 ENCOUNTER — Ambulatory Visit (INDEPENDENT_AMBULATORY_CARE_PROVIDER_SITE_OTHER): Payer: Medicare HMO | Admitting: Nurse Practitioner

## 2022-09-18 VITALS — BP 138/64 | HR 73 | Temp 97.5°F | Ht 63.0 in | Wt 174.0 lb

## 2022-09-18 DIAGNOSIS — I10 Essential (primary) hypertension: Secondary | ICD-10-CM | POA: Diagnosis not present

## 2022-09-18 DIAGNOSIS — R2689 Other abnormalities of gait and mobility: Secondary | ICD-10-CM | POA: Diagnosis not present

## 2022-09-18 DIAGNOSIS — R3915 Urgency of urination: Secondary | ICD-10-CM | POA: Diagnosis not present

## 2022-09-18 DIAGNOSIS — M6281 Muscle weakness (generalized): Secondary | ICD-10-CM | POA: Diagnosis not present

## 2022-09-19 ENCOUNTER — Other Ambulatory Visit: Payer: Self-pay | Admitting: Nurse Practitioner

## 2022-09-19 DIAGNOSIS — R3915 Urgency of urination: Secondary | ICD-10-CM

## 2022-09-19 MED ORDER — SULFAMETHOXAZOLE-TRIMETHOPRIM 800-160 MG PO TABS: 1.0000 | ORAL_TABLET | Freq: Two times a day (BID) | ORAL | 0 refills | Status: DC

## 2022-09-20 LAB — URINE CULTURE
MICRO NUMBER:: 14448430
SPECIMEN QUALITY:: ADEQUATE

## 2022-09-20 LAB — URINALYSIS, ROUTINE W REFLEX MICROSCOPIC
Bilirubin Urine: NEGATIVE
Glucose, UA: NEGATIVE
Ketones, ur: NEGATIVE
Nitrite: POSITIVE — AB
RBC / HPF: NONE SEEN /HPF (ref 0–2)
Specific Gravity, Urine: 1.018 (ref 1.001–1.035)
pH: <=5 (ref 5.0–8.0)

## 2022-09-20 LAB — MICROSCOPIC MESSAGE

## 2022-09-21 ENCOUNTER — Other Ambulatory Visit: Payer: Self-pay | Admitting: Nurse Practitioner

## 2022-09-21 DIAGNOSIS — R3915 Urgency of urination: Secondary | ICD-10-CM

## 2022-09-21 DIAGNOSIS — R2689 Other abnormalities of gait and mobility: Secondary | ICD-10-CM | POA: Diagnosis not present

## 2022-09-21 DIAGNOSIS — M6281 Muscle weakness (generalized): Secondary | ICD-10-CM | POA: Diagnosis not present

## 2022-09-21 MED ORDER — SULFAMETHOXAZOLE-TRIMETHOPRIM 800-160 MG PO TABS: 1.0000 | ORAL_TABLET | Freq: Two times a day (BID) | ORAL | 0 refills | Status: DC

## 2022-09-25 DIAGNOSIS — R2689 Other abnormalities of gait and mobility: Secondary | ICD-10-CM | POA: Diagnosis not present

## 2022-09-25 DIAGNOSIS — M6281 Muscle weakness (generalized): Secondary | ICD-10-CM | POA: Diagnosis not present

## 2022-09-28 DIAGNOSIS — M6281 Muscle weakness (generalized): Secondary | ICD-10-CM | POA: Diagnosis not present

## 2022-09-28 DIAGNOSIS — R2689 Other abnormalities of gait and mobility: Secondary | ICD-10-CM | POA: Diagnosis not present

## 2022-10-02 DIAGNOSIS — R2689 Other abnormalities of gait and mobility: Secondary | ICD-10-CM | POA: Diagnosis not present

## 2022-10-02 DIAGNOSIS — M6281 Muscle weakness (generalized): Secondary | ICD-10-CM | POA: Diagnosis not present

## 2022-10-05 ENCOUNTER — Other Ambulatory Visit: Payer: Self-pay

## 2022-10-05 DIAGNOSIS — E782 Mixed hyperlipidemia: Secondary | ICD-10-CM

## 2022-10-05 DIAGNOSIS — R2689 Other abnormalities of gait and mobility: Secondary | ICD-10-CM | POA: Diagnosis not present

## 2022-10-05 DIAGNOSIS — M6281 Muscle weakness (generalized): Secondary | ICD-10-CM | POA: Diagnosis not present

## 2022-10-05 MED ORDER — ROSUVASTATIN CALCIUM 20 MG PO TABS: ORAL_TABLET | ORAL | 3 refills | Status: DC

## 2022-10-06 ENCOUNTER — Ambulatory Visit (INDEPENDENT_AMBULATORY_CARE_PROVIDER_SITE_OTHER): Payer: Medicare HMO

## 2022-10-06 DIAGNOSIS — R3915 Urgency of urination: Secondary | ICD-10-CM

## 2022-10-06 NOTE — Progress Notes (Signed)
The patient returns today for repeat urine culture. She reports she feels that the UTI has cleared and she has no new symptoms or complaints.

## 2022-10-07 LAB — URINE CULTURE
MICRO NUMBER:: 14526057
Result:: NO GROWTH
SPECIMEN QUALITY:: ADEQUATE

## 2022-10-09 DIAGNOSIS — R2689 Other abnormalities of gait and mobility: Secondary | ICD-10-CM | POA: Diagnosis not present

## 2022-10-09 DIAGNOSIS — M6281 Muscle weakness (generalized): Secondary | ICD-10-CM | POA: Diagnosis not present

## 2022-10-12 DIAGNOSIS — M6281 Muscle weakness (generalized): Secondary | ICD-10-CM | POA: Diagnosis not present

## 2022-10-12 DIAGNOSIS — R2689 Other abnormalities of gait and mobility: Secondary | ICD-10-CM | POA: Diagnosis not present

## 2022-11-03 DIAGNOSIS — H401131 Primary open-angle glaucoma, bilateral, mild stage: Secondary | ICD-10-CM | POA: Diagnosis not present

## 2022-11-12 ENCOUNTER — Encounter: Payer: Medicare HMO | Admitting: Internal Medicine

## 2022-12-01 ENCOUNTER — Other Ambulatory Visit: Payer: Self-pay | Admitting: Internal Medicine

## 2022-12-01 DIAGNOSIS — Z0000 Encounter for general adult medical examination without abnormal findings: Secondary | ICD-10-CM

## 2022-12-17 ENCOUNTER — Ambulatory Visit (INDEPENDENT_AMBULATORY_CARE_PROVIDER_SITE_OTHER): Payer: Medicare HMO | Admitting: Internal Medicine

## 2022-12-17 ENCOUNTER — Encounter: Payer: Self-pay | Admitting: Internal Medicine

## 2022-12-17 VITALS — BP 134/70 | HR 75 | Temp 97.9°F | Resp 16 | Ht 63.0 in | Wt 170.4 lb

## 2022-12-17 DIAGNOSIS — Z9181 History of falling: Secondary | ICD-10-CM | POA: Diagnosis not present

## 2022-12-17 DIAGNOSIS — R7309 Other abnormal glucose: Secondary | ICD-10-CM

## 2022-12-17 DIAGNOSIS — Z79899 Other long term (current) drug therapy: Secondary | ICD-10-CM | POA: Diagnosis not present

## 2022-12-17 DIAGNOSIS — R2681 Unsteadiness on feet: Secondary | ICD-10-CM | POA: Diagnosis not present

## 2022-12-17 DIAGNOSIS — E538 Deficiency of other specified B group vitamins: Secondary | ICD-10-CM | POA: Diagnosis not present

## 2022-12-17 DIAGNOSIS — E782 Mixed hyperlipidemia: Secondary | ICD-10-CM | POA: Diagnosis not present

## 2022-12-17 DIAGNOSIS — E559 Vitamin D deficiency, unspecified: Secondary | ICD-10-CM | POA: Diagnosis not present

## 2022-12-17 DIAGNOSIS — G20A2 Parkinson's disease without dyskinesia, with fluctuations: Secondary | ICD-10-CM | POA: Diagnosis not present

## 2022-12-17 DIAGNOSIS — I10 Essential (primary) hypertension: Secondary | ICD-10-CM | POA: Diagnosis not present

## 2022-12-17 NOTE — Patient Instructions (Addendum)
Due to recent changes in healthcare laws, you may see the results of your imaging and laboratory studies on MyChart before your provider has had a chance to review them.  We understand that in some cases there may be results that are confusing or concerning to you. Not all laboratory results come back in the same time frame and the provider may be waiting for multiple results in order to interpret others.  Please give us 48 hours in order for your provider to thoroughly review all the results before contacting the office for clarification of your results.  ++++++++++++++++++++++++++  Vit D  & Vit C 1,000 mg   are recommended to help protect  against the Covid-19 and other Corona viruses.    Also it's recommended  to take  Zinc 50 mg  to help  protect against the Covid-19   and best place to get  is also on [employer].com  and don't pay more than 6-8 cents /pill !   +++++++++++++++++++++++++++++++++++++++ Recommend Adult Low Dose Aspirin or  coated  Aspirin 81 mg daily  To reduce risk of Colon Cancer 40 %,  Skin Cancer 26 % ,  Melanoma 46%  and  Pancreatic cancer 60% +++++++++++++++++++++++++++++++++++++++++ Vitamin D goal  is between 70-100.  Please make sure that you are taking your Vitamin D as directed.  It is very important as a natural anti-inflammatory  helping hair, skin, and nails, as well as reducing stroke and heart attack risk.  It helps your bones and helps with mood. It also decreases numerous cancer risks so please take it as directed.  Low Vit D is associated with a 200-300% higher risk for CANCER  and 200-300% higher risk for HEART   ATTACK  &  STROKE.   ...................................... It is also associated with higher death rate at younger ages,  autoimmune diseases like Rheumatoid arthritis, Lupus, Multiple Sclerosis.    Also many other serious conditions, like depression, Alzheimer's Dementia, infertility, muscle aches, fatigue, fibromyalgia - just to name  a few. +++++++++++++++++++++++++++++++++++++++++ Recommend the book "The END of DIETING" by Dr Joel Fuhrman  & the book "The END of DIABETES " by Dr Joel Fuhrman At [employer] - get book & Audio CD's    Being diabetic has a  300% increased risk for heart attack, stroke, cancer, and alzheimer- type vascular dementia. It is very important that you work harder with diet by avoiding all foods that are white. Avoid white rice (brown & wild rice is OK), white potatoes (sweetpotatoes in moderation is OK), White bread or wheat bread or anything made out of white flour like bagels, donuts, rolls, buns, biscuits, cakes, pastries, cookies, pizza crust, and pasta (made from white flour & egg whites) - vegetarian pasta or spinach or wheat pasta is OK. Multigrain breads like Arnold's or [employer], or multigrain sandwich thins or flatbreads.  Diet, exercise and weight loss can reverse and cure diabetes in the early stages.  Diet, exercise and weight loss is very important in the control and prevention of complications of diabetes which affects every system in your body, ie. Brain - dementia/stroke, eyes - glaucoma/blindness, heart - heart attack/heart failure, kidneys - dialysis, stomach - gastric paralysis, intestines - malabsorption, nerves - severe painful neuritis, circulation - gangrene & loss of a leg(s), and finally cancer and Alzheimers.    I recommend avoid fried & greasy foods,  sweets/candy, white rice (brown or wild rice or Quinoa is OK), white potatoes (sweet potatoes are OK) - anything   made from white flour - bagels, doughnuts, rolls, buns, biscuits,white and wheat breads, pizza crust and traditional pasta made of white flour & egg white(vegetarian pasta or spinach or wheat pasta is OK).  Multi-grain bread is OK - like multi-grain flat bread or sandwich thins. Avoid alcohol in excess. Exercise is also important.    Eat all the vegetables you want - avoid meat, especially red meat and dairy - especially  cheese.  Cheese is the most concentrated form of trans-fats which is the worst thing to clog up our arteries. Veggie cheese is OK which can be found in the fresh produce section at [employer] or [employer] or [employer]  +++++++++++++++++++++++++++++++++++++++ DASH Eating Plan  DASH stands for "Dietary Approaches to Stop Hypertension."   The DASH eating plan is a healthy eating plan that has been shown to reduce high blood pressure (hypertension). Additional health benefits may include reducing the risk of type 2 diabetes mellitus, heart disease, and stroke. The DASH eating plan may also help with weight loss. WHAT DO I NEED TO KNOW ABOUT THE DASH EATING PLAN? For the DASH eating plan, you will follow these general guidelines: Choose foods with a percent daily value for sodium of less than 5% (as listed on the food label). Use salt-free seasonings or herbs instead of table salt or sea salt. Check with your health care provider or pharmacist before using salt substitutes. Eat lower-sodium products, often labeled as "lower sodium" or "no salt added." Eat fresh foods. Eat more vegetables, fruits, and low-fat dairy products. Choose whole grains. Look for the word "whole" as the first word in the ingredient list. Choose fish  Limit sweets, desserts, sugars, and sugary drinks. Choose heart-healthy fats. Eat veggie cheese  Eat more home-cooked food and less restaurant, buffet, and fast food. Limit fried foods. Cook foods using methods other than frying. Limit canned vegetables. If you do use them, rinse them well to decrease the sodium. When eating at a restaurant, ask that your food be prepared with less salt, or no salt if possible.                      WHAT FOODS CAN I EAT? Read Dr Joel Fuhrman's books on The End of Dieting & The End of Diabetes  Grains Whole grain or whole wheat bread. Brown rice. Whole grain or whole wheat pasta. Quinoa, bulgur, and whole grain cereals. Low-sodium  cereals. Corn or whole wheat flour tortillas. Whole grain cornbread. Whole grain crackers. Low-sodium crackers.  Vegetables Fresh or frozen vegetables (raw, steamed, roasted, or grilled). Low-sodium or reduced-sodium tomato and vegetable juices. Low-sodium or reduced-sodium tomato sauce and paste. Low-sodium or reduced-sodium canned vegetables.   Fruits All fresh, canned (in natural juice), or frozen fruits.  Protein Products  All fish and seafood.  Dried beans, peas, or lentils. Unsalted nuts and seeds. Unsalted canned beans.  Dairy Low-fat dairy products, such as skim or 1% milk, 2% or reduced-fat cheeses, low-fat ricotta or cottage cheese, or plain low-fat yogurt. Low-sodium or reduced-sodium cheeses.  Fats and Oils Tub margarines without trans fats. Light or reduced-fat mayonnaise and salad dressings (reduced sodium). Avocado. Safflower, olive, or canola oils. Natural peanut or almond butter.  Other Unsalted popcorn and pretzels. The items listed above may not be a complete list of recommended foods or beverages. Contact your dietitian for more options.  +++++++++++++++  WHAT FOODS ARE NOT RECOMMENDED? Grains/ White flour or wheat flour White bread. White pasta. White rice. Refined   cornbread. Bagels and croissants. Crackers that contain trans fat.  Vegetables  Creamed or fried vegetables. Vegetables in a . Regular canned vegetables. Regular canned tomato sauce and paste. Regular tomato and vegetable juices.  Fruits Dried fruits. Canned fruit in light or heavy syrup. Fruit juice.  Meat and Other Protein Products Meat in general - RED meat & White meat.  Fatty cuts of meat. Ribs, chicken wings, all processed meats as bacon, sausage, bologna, salami, fatback, hot dogs, bratwurst and packaged luncheon meats.  Dairy Whole or 2% milk, cream, half-and-half, and cream cheese. Whole-fat or sweetened yogurt. Full-fat cheeses or blue cheese. Non-dairy creamers and whipped toppings.  Processed cheese, cheese spreads, or cheese curds.  Condiments Onion and garlic salt, seasoned salt, table salt, and sea salt. Canned and packaged gravies. Worcestershire sauce. Tartar sauce. Barbecue sauce. Teriyaki sauce. Soy sauce, including reduced sodium. Steak sauce. Fish sauce. Oyster sauce. Cocktail sauce. Horseradish. Ketchup and mustard. Meat flavorings and tenderizers. Bouillon cubes. Hot sauce. Tabasco sauce. Marinades. Taco seasonings. Relishes.  Fats and Oils Butter, stick margarine, lard, shortening and bacon fat. Coconut, palm kernel, or palm oils. Regular salad dressings.  Pickles and olives. Salted popcorn and pretzels.  The items listed above may not be a complete list of foods and beverages to avoid.  ==========================  Understanding Your Risk for Falls  Millions of people have serious injuries from falls each year. It is important to understand your risk of falling. Talk with your health care provider about your risk and what you can do to lower it. If you do have a serious fall, make sure to tell your provider. Falling once raises your risk of falling again. How can falls affect me? Serious injuries from falls are common. These include: Broken bones, such as hip fractures. Head injuries, such as traumatic brain injuries (TBI) or concussions. A fear of falling can cause you to avoid activities and stay at home. This can make your muscles weaker and raise your risk for a fall. What can increase my risk? There are a number of risk factors that increase your risk for falling. The more risk factors you have, the higher your risk of falling. Serious injuries from a fall happen most often to people who are older than 81 years old. Teenagers and young adults ages 88-29 are also at higher risk. Common risk factors include: Weakness in the lower body. Being generally weak or confused due to long-term (chronic) illness. Dizziness or balance problems. Poor  vision. Medicines that cause dizziness or drowsiness. These may include: Medicines for your blood pressure, heart, anxiety, insomnia, or swelling (edema). Pain medicines. Muscle relaxants. Other risk factors include: Drinking alcohol. Having had a fall in the past. Having foot pain or wearing improper footwear. Working at a dangerous job. Having any of the following in your home: Tripping hazards, such as floor clutter or loose rugs. Poor lighting. Pets. Having dementia or memory loss. What actions can I take to lower my risk of falling?     Physical activity Stay physically fit. Do strength and balance exercises. Consider taking a regular class to build strength and balance. Yoga and tai chi are good options. Vision Have your eyes checked every year and your prescription for glasses or contacts updated as needed. Shoes and walking aids Wear non-skid shoes. Wear shoes that have rubber soles and low heels. Do not wear high heels. Do not walk around the house in socks or slippers. Use a cane or walker as told by your  provider. Home safety Attach secure railings on both sides of your stairs. Install grab bars for your bathtub, shower, and toilet. Use a non-skid mat in your bathtub or shower. Attach bath mats securely with double-sided, non-slip rug tape. Use good lighting in all rooms. Keep a flashlight near your bed. Make sure there is a clear path from your bed to the bathroom. Use night-lights. Do not use throw rugs. Make sure all carpeting is taped or tacked down securely. Remove all clutter from walkways and stairways, including extension cords. Repair uneven or broken steps and floors. Avoid walking on icy or slippery surfaces. Walk on the grass instead of on icy or slick sidewalks. Use ice melter to get rid of ice on walkways in the winter. Use a cordless phone. Questions to ask your health care provider Can you help me check my risk for a fall? Do any of my medicines  make me more likely to fall? Should I take a vitamin D supplement? What exercises can I do to improve my strength and balance? Should I make an appointment to have my vision checked? Do I need a bone density test to check for weak bones (osteoporosis)? Would it help to use a cane or a walker? Where to find more information Centers for Disease Control and Prevention, STEADI: TonerPromos.no Community-Based Fall Prevention Programs: TonerPromos.no [employer] on Aging: BaseRingTones.pl Contact a health care provider if: You fall at home. You are afraid of falling at home. You feel weak, drowsy, or dizzy.

## 2022-12-17 NOTE — Progress Notes (Signed)
Future Appointments  Date Time Provider Department  12/17/2022  2:00 PM Lucky Cowboy, MD [facility]  12/29/2022  9:15 AM Tat, Octaviano Batty, DO [facility]  01/04/2023  4:40 PM [facility]  01/06/2023                          cpe  9:00 AM Adela Glimpse, NP [facility]  03/24/2023                       wellness  9:30 AM Raynelle Dick, NP [facility]    History of Present Illness:       This very nice 82 y.o. [name]  presents for 3 month follow up with HTN, HLD, Pre-Diabetes, hx/o  RSD/CRPS of Rt hand  and Vitamin D Deficiency.        Patient is followed by Dr Lurena Joiner Tat for Parkinson's Dz  diagnosed Mar 2023 and after   positive DaTscan (11/25/2021), patient was started on Sinemet. Patient has continued to have Gait /balance difficulties  Patient also has hx/o Vit B12 deficiency  ("304" in Sept 2022) and was recommended start SL Vit B12.        Patient is treated for HTN  since  2008  & BP has been controlled at home. Today's BP is at goal - 134/70. Patient has had no complaints of any cardiac type chest pain, palpitations, dyspnea Pollyann Kennedy /PND, dizziness, claudication  or dependent edema.        Hyperlipidemia is controlled with diet & Rosuvastatin . Patient denies myalgias or other med SE's. Last Lipids were at goal :  Lab Results  Component Value Date   CHOL 177 08/26/2022   HDL 84 08/26/2022   LDLCALC 77 08/26/2022   TRIG 76 08/26/2022   CHOLHDL 2.1 08/26/2022     Also, the patient has history of PreDiabetes  (A1c 6.0% /2011)  and has had no symptoms of reactive hypoglycemia, diabetic polys, paresthesias or visual blurring.  Last A1c was Normal & at goal :  Lab Results  Component Value Date   HGBA1C 5.3 05/22/2022                                                         Further, the patient also has history of Vitamin D Deficiency and supplements vitamin D . Last vitamin D was at goal :  Lab Results  Component Value Date   VD25OH 64 05/22/2022     Current  Outpatient Medications on File Prior to Visit  Medication Sig   ALPRAZolam  0.5 MG tablet TAKE 1/2 TO 1  TABLET 3 x /day AS NEEDED    VITAMIN C 500 MG Take  daily.   carbidopa-levodopa (SINEMET IR) 25-100 MG tablet TAKE ONE TAB3 x/ day @ 7AM, 11AM & 4PM*   CHELATED MAGNESIUM  250 mg  Take b 2 times daily.   VITAMIN D  1000 u Take   morning and bedtime.   VITAMIN B12 1000 MCG tablet Take    daily.   Fexofenadine 180 MG tablet Take  daily as needed    furosemide  40 MG tablet Take 1 tablet  as needed    losartan-hctz  50-12.5 MG tablet TAKE ONE TABLET DAILY   Omega-3 FISH  OIL 1000 MG CAPS Take  2  times daily.   pregabalin (LYRICA) 150 MG capsule Take  1 capsule  at Bedtime    Rosuvastatin  20 MG tablet Take 1 tablet MWF   BACTRIM DS 800-160 MG tablet Take 1 tablet by mouth 2 (two) times daily.   TIMOPTIC 0.5 % ophth  soln Place 1 drop into both eyes every morning.    zinc  50 MG tablet Take  daily.     Allergies  Allergen Reactions   Oxycodone-Acetaminophen Other (See Comments)    panic attack   Vit C-Cholecalciferol-Rose Hip [Cholecalciferol-Vitamin C] Other (See Comments)    dyspepsia   Ibuprofen Hives   Macrodantin [Nitrofurantoin Macrocrystal] Rash      PMHx:   Past Medical History:  Diagnosis Date   Allergy    Arthritis    Blood transfusion without reported diagnosis    Cataract    removed both eyes    Cervical radicular pain 02/11/2016   Glaucoma    low pressure glaucoma    Hyperlipidemia    Hypertension    Osteopenia    Parkinson's disease    Pelvis fracture (HCC) 03/26/2002   both sides fx   Positive colorectal cancer screening using Cologuard test 04/14/2017   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    Vitamin D deficiency       Immunization History  Administered Date(s) Administered   Fluad Quad(high Dose) 05/23/2021   Influenza, High Dose  06/05/2019, 06/13/2020, 06/29/2022   Influenza 06/13/2013, 05/02/2015, 06/24/2018   Moderna Sars-Covid-2  Vacc 09/13/2019, 10/11/2019, 07/01/2020, 01/17/2021   Pneumococcal - 13 05/24/2014   Pneumococcal - 23 01/08/2009   Td 02/12/2022   Tdap 01/28/2012   Zoster Recombinat (Shingrix) 02/24/2022, 06/29/2022   Zoster, Live 08/31/2006      Past Surgical History:  Procedure Laterality Date   COLONOSCOPY     DILATION AND CURETTAGE OF UTERUS     age 50    right elbow   surgery  March 26, 2002   right hand ulner nerve surgery  March 26, 2002   I and D - right hand/wrist abscess   right shoulder humerus fx  july 2003   surgery done   right total hip arthroplasty  2008   right total knee arthroplasty  sept 2012   right wrist plating  2004   TONSILLECTOMY  age 35   TOTAL HIP ARTHROPLASTY  10/22/2011   Procedure: TOTAL HIP ARTHROPLASTY ANTERIOR APPROACH;  Surgeon: Shelda Pal, MD;  Location: [facility];  Service: Orthopedics;  Laterality: Left;  Left Total Hip Arthroplasty,  Anterior Approach    TOTAL KNEE ARTHROPLASTY Left 04/28/2022   Procedure: TOTAL KNEE ARTHROPLASTY;  Surgeon: Durene Romans, MD;  Location: [facility];  Service: Orthopedics;  Laterality: Left;     FHx:    Reviewed / unchanged   SHx:    Reviewed / unchanged    Systems Review:  Constitutional: Denies fever, chills, wt changes, headaches, insomnia, fatigue, night sweats, change in appetite. Eyes: Denies redness, blurred vision, diplopia, discharge, itchy, watery eyes.  ENT: Denies discharge, congestion, post nasal drip, epistaxis, sore throat, earache, hearing loss, dental pain, tinnitus, vertigo, sinus pain, snoring.  CV: Denies chest pain, palpitations, irregular heartbeat, syncope, dyspnea, diaphoresis, orthopnea, PND, claudication or edema. Respiratory: denies cough, dyspnea, DOE, pleurisy, hoarseness, laryngitis, wheezing.  Gastrointestinal: Denies dysphagia, odynophagia, heartburn, reflux, water brash, abdominal pain or cramps, nausea, vomiting, bloating, diarrhea, constipation, hematemesis, melena, hematochezia  or  hemorrhoids. Genitourinary: Denies dysuria, frequency, urgency, nocturia, hesitancy, discharge, hematuria or flank pain. Musculoskeletal: Denies arthralgias, myalgias, stiffness, jt. swelling, pain, limping or strain/sprain.  Skin: Denies pruritus, rash, hives, warts, acne, eczema or change in skin lesion(s). Neuro: No weakness, tremor, incoordination, spasms, paresthesia or pain. Psychiatric: Denies confusion, memory loss or sensory loss. Endo: Denies change in weight, skin or hair change.  Heme/Lymph: No excessive bleeding, bruising or enlarged lymph nodes.   Physical Exam  BP 134/70   Pulse 75   Temp 97.9 F (36.6 C)   Resp 16   Ht  (1.6 m)   Wt 170 lb 6.4 oz (77.3 kg)   SpO2 96%   BMI 30.19 kg/m   Appears  well nourished, well groomed  and in no distress.  Eyes: PERRLA, EOMs, conjunctiva no swelling or erythema. Sinuses: No frontal/maxillary tenderness ENT/Mouth: EAC's clear, TM's nl w/o erythema, bulging. Nares clear w/o erythema, swelling, exudates. Oropharynx clear without erythema or exudates. Oral hygiene is good. Tongue normal, non obstructing. Hearing intact.  Neck: Supple. Thyroid not palpable. Car 2+/2+ without bruits, nodes or JVD. Chest: Respirations nl with BS clear & equal w/o rales, rhonchi, wheezing or stridor.  Cor: Heart sounds normal w/ regular rate and rhythm without sig. murmurs, gallops, clicks or rubs. Peripheral pulses normal and equal  without edema.  Abdomen: Soft & bowel sounds normal. Non-tender w/o guarding, rebound, hernias, masses or organomegaly.  Lymphatics: Unremarkable.  Musculoskeletal: Full ROM all peripheral extremities, joint stability, 5/5 strength and normal gait.  Skin: Warm, dry without exposed rashes, lesions or ecchymosis apparent.  Neuro: Cranial nerves intact, reflexes equal bilaterally. Sensory-motor testing grossly intact. Tendon reflexes grossly intact.  Pysch: Alert & oriented x 3.  Insight and judgement nl & appropriate.  No ideations.   Assessment and Plan:  1.  Essential hypertension  - Continue medication, monitor blood pressure at home.  - Continue DASH diet.  Reminder to go to the ER if any CP,  SOB, nausea, dizziness, severe HA, changes vision/speech.  - CBC with Differential/Platelet - COMPLETE METABOLIC PANEL WITH GFR - Magnesium - TSH   2. Hyperlipidemia, mixed  - Continue diet/meds, exercise,& lifestyle modifications.  - Continue monitor periodic cholesterol/liver & renal functions    - Lipid panel - TSH   3. Abnormal glucose  - Continue diet, exercise  - Lifestyle modifications.  - Monitor appropriate labs   - Hemoglobin A1c - Insulin, random   4. Vitamin D deficiency  - Continue supplementation  - VITAMIN D 25 Hydroxy    5. Vitamin B12 deficiency  - Vitamin B12   6. Parkinson's disease    7. Unstable gait   8. At risk for falling   9. Medication management  - CBC with Differential/Platelet - COMPLETE METABOLIC PANEL WITH GFR - Magnesium - Lipid panel - TSH - Hemoglobin A1c - Insulin, random - VITAMIN D 25 Hydroxy  - Vitamin B12         Discussed  regular exercise, BP monitoring, weight control to achieve/maintain BMI less than 25 and discussed med and SE's. Recommended labs to assess /monitor clinical status .  I discussed the assessment and treatment plan with the patient. The patient was provided an opportunity to ask questions and all were answered. The patient agreed with the plan and demonstrated an understanding of the instructions.  I provided over 30 minutes of exam, counseling, chart review and  complex critical decision making.        The patient was  advised to call back or seek an in-person evaluation if the symptoms worsen or if the condition fails to improve as anticipated.   Kirtland Bouchard, MD

## 2022-12-23 ENCOUNTER — Encounter: Payer: Medicare HMO | Admitting: Internal Medicine

## 2022-12-27 ENCOUNTER — Other Ambulatory Visit: Payer: Self-pay | Admitting: Neurology

## 2022-12-27 DIAGNOSIS — G20A1 Parkinson's disease without dyskinesia, without mention of fluctuations: Secondary | ICD-10-CM

## 2022-12-28 NOTE — Progress Notes (Unsigned)
Assessment/Plan:   1.  Parkinsons Disease  -DaTscan was abnormal November 25, 2021 with decreased radiotracer activity bilaterally, right greater than left.  -Continue carbidopa/levodopa 25/100, 1 tablet 3 times per day.  2.  B12 deficiency  -Now on oral supplementation.  3.  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  -Patient does have mild evidence of peripheral neuropathy, which could explain the paresthesias of her toes.  This does not, however, explain her gait, nor does Parkinson's disease.  4.  Gait abnormality  -following with ortho for legs.  This looks like an ortho issue.  Following with Dr. Charlann Boxer   Subjective:   Gwendolyn [name] was seen today in follow up for Parkinsons disease.  My previous records were reviewed prior to todays visit as well as outside records available to me.   Patient doing fairly well from a Parkinson's standpoint.  She has had no falls since last visit.  No lightheadedness or near syncope.  She saw her primary care physician on April 18.  Notes are reviewed.  Current prescribed movement disorder medications: Carbidopa/levodopa 25/100, 1 tablet 3 times per day.   ALLERGIES:   Allergies  Allergen Reactions   Oxycodone-Acetaminophen Other (See Comments)    panic attack   Vit C-Cholecalciferol-Rose Hip [Cholecalciferol-Vitamin C] Other (See Comments)    dyspepsia   Ibuprofen Hives   Macrodantin [Nitrofurantoin Macrocrystal] Rash    CURRENT MEDICATIONS:  No outpatient medications have been marked as taking for the 06/25/22 encounter (Appointment) with [place] Cookson, Octaviano Batty, DO.     Objective:   PHYSICAL EXAMINATION:    VITALS:   There were no vitals filed for this visit.   GEN:  The patient appears stated age and is in NAD. HEENT:  Normocephalic.  Healing/old area of ecchymosis around the R eye  The mucous membranes are moist. The superficial temporal arteries are without ropiness or tenderness. CV:  RRR Lungs:  CTAB Neck/HEME:  There are no  carotid bruits bilaterally.  Neurological examination:  Orientation: The patient is alert and oriented x3. Cranial nerves: There is good facial symmetry without facial hypomimia. The speech is fluent and clear. Soft palate rises symmetrically and there is no tongue deviation. Hearing is intact to conversational tone. Sensation: Sensation is intact to light touch throughout Motor: Strength is at least antigravity x4.  Movement examination: Tone: There is no increased tone (nl bilaterally) Abnormal movements: none even with distraction in the hands/legs.  Has some mouth tremor Coordination:  There is no decremation, with any form of RAMS, including alternating supination and pronation of the forearm, hand opening and closing, finger taps, heel taps and toe taps. Gait and Station: The patient pushes off to arise.  Walks with cane.  She is antalgic.  No shuffling.    I have reviewed and interpreted the following labs independently    Chemistry      Component Value Date/Time   NA 139 05/22/2022 0000   K 4.2 05/22/2022 0000   CL 101 05/22/2022 0000   CO2 27 05/22/2022 0000   BUN 24 05/22/2022 0000   CREATININE 0.77 05/22/2022 0000      Component Value Date/Time   CALCIUM 9.3 05/22/2022 0000   ALKPHOS 87 03/31/2017 0927   AST 21 05/22/2022 0000   ALT 12 05/22/2022 0000   BILITOT 0.5 05/22/2022 0000       Lab Results  Component Value Date   WBC 8.8 05/22/2022   HGB 12.3 05/22/2022   HCT 37.9 05/22/2022   MCV 90.9  05/22/2022   PLT 465 (H) 05/22/2022    Lab Results  Component Value Date   TSH 0.78 05/22/2022   Total time spent on today's visit was *** minutes, including both face-to-face time and nonface-to-face time.  Time included that spent on review of records (prior notes available to me/labs/imaging if pertinent), discussing treatment and goals, answering patient's questions and coordinating care.    Cc:  Lucky Cowboy, MD

## 2022-12-28 NOTE — Telephone Encounter (Signed)
Called patient she has plenty of meds and she will be here for an appointment tomorrow

## 2022-12-29 ENCOUNTER — Other Ambulatory Visit: Payer: Self-pay

## 2022-12-29 ENCOUNTER — Ambulatory Visit: Payer: Medicare HMO | Admitting: Neurology

## 2022-12-29 ENCOUNTER — Encounter: Payer: Self-pay | Admitting: Neurology

## 2022-12-29 VITALS — BP 138/80 | HR 74 | Ht 65.0 in | Wt 171.0 lb

## 2022-12-29 DIAGNOSIS — G20A1 Parkinson's disease without dyskinesia, without mention of fluctuations: Secondary | ICD-10-CM | POA: Diagnosis not present

## 2022-12-29 DIAGNOSIS — R29898 Other symptoms and signs involving the musculoskeletal system: Secondary | ICD-10-CM

## 2022-12-29 DIAGNOSIS — S76011A Strain of muscle, fascia and tendon of right hip, initial encounter: Secondary | ICD-10-CM

## 2022-12-29 DIAGNOSIS — S76019A Strain of muscle, fascia and tendon of unspecified hip, initial encounter: Secondary | ICD-10-CM

## 2023-01-04 ENCOUNTER — Ambulatory Visit
Admission: RE | Admit: 2023-01-04 | Discharge: 2023-01-04 | Disposition: A | Discharge status: Home / Self Care | Payer: Medicare HMO | Source: Ambulatory Visit | Attending: Internal Medicine | Admitting: Internal Medicine

## 2023-01-04 DIAGNOSIS — Z0000 Encounter for general adult medical examination without abnormal findings: Secondary | ICD-10-CM

## 2023-01-04 DIAGNOSIS — Z1231 Encounter for screening mammogram for malignant neoplasm of breast: Secondary | ICD-10-CM | POA: Diagnosis not present

## 2023-01-05 ENCOUNTER — Ambulatory Visit (INDEPENDENT_AMBULATORY_CARE_PROVIDER_SITE_OTHER): Payer: Medicare HMO | Admitting: Nurse Practitioner

## 2023-01-05 ENCOUNTER — Encounter: Payer: Self-pay | Admitting: Nurse Practitioner

## 2023-01-05 VITALS — BP 140/62 | HR 63 | Temp 97.6°F | Ht 63.0 in | Wt 173.0 lb

## 2023-01-05 DIAGNOSIS — M545 Low back pain, unspecified: Secondary | ICD-10-CM | POA: Diagnosis not present

## 2023-01-05 DIAGNOSIS — R102 Pelvic and perineal pain: Secondary | ICD-10-CM | POA: Diagnosis not present

## 2023-01-05 MED ORDER — CIPROFLOXACIN HCL 250 MG PO TABS: ORAL_TABLET | ORAL | 0 refills | Status: DC

## 2023-01-05 NOTE — Progress Notes (Signed)
Assessment and Plan:  Gwendolyn [name] was seen today for an episodic .  Diagnoses and all order for this visit:  Acute left-sided low back pain without sciatica Stay well hydrated to keep urinary system well flushed Suggest cranberry supplement Start tmt with Ciprofloxacin as directed.  - Urinalysis, Routine w reflex microscopic - Urine Culture  Suprapubic abdominal pain Continue to monitor for increase in fever, chills, N/V, abdominal pain. Contact office if noticed or s/s fail to improve.  - Urinalysis, Routine w reflex microscopic - Urine Culture  Meds ordered this encounter  Medications   ciprofloxacin (CIPRO) 250 MG tablet    Sig: Take 1 tablet 2 x /day with Food for Infection    Dispense:  14 tablet    Refill:  0    Order Specific Question:   Supervising Provider    Answer:   Lucky Cowboy [phone]   Notify office for further evaluation and treatment, questions or concerns if s/s fail to improve. The risks and benefits of my recommendations, as well as other treatment options were discussed with the patient today. Questions were answered.  Further disposition pending results of labs. Discussed med's effects and SE's.    Over 15 minutes of exam, counseling, chart review, and critical decision making was performed.   Future Appointments  Date Time Provider Department Center  01/06/2023  9:00 AM Adela Glimpse, NP [facility] None  03/24/2023  9:30 AM Raynelle Dick, NP [facility] None  07/06/2023  2:30 PM Tat, Octaviano Batty, DO [facility] None    ------------------------------------------------------------------------------------------------------------------   HPI BP (!) 140/62   Pulse 63   Temp 97.6 F (36.4 C)   Ht 5\' 3"  (1.6 m)   Wt 173 lb (78.5 kg)   SpO2 98%   BMI 30.65 kg/m    Patient complains of frequency and suprapubic pressure. She has had symptoms for 2 days. Patient also complains of back pain. Patient denies fever, headache, and vaginal  discharge. Patient does have a history of recurrent UTI. Patient does not have a history of pyelonephritis. She has been drinking sugar free cranberry juice.     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   carbidopa-levodopa (SINEMET IR) 25-100 MG tablet TAKE ONE TABLET BY MOUTH THREE TIMES A DAY (AT 7AM, 11AM, AND 4PM)   CHELATED MAGNESIUM PO Take 250 mg by mouth 2 (two) times daily.  cholecalciferol (VITAMIN D3) 25 MCG (1000 UNIT) tablet Take 1,000 Units by mouth in the morning and at bedtime.   cyanocobalamin (VITAMIN B12) 1000 MCG tablet Take 1,000 mcg by mouth daily.   fexofenadine (ALLEGRA) 180 MG tablet Take 180 mg by mouth daily as needed for allergies or rhinitis.   furosemide (LASIX) 40 MG tablet Take 1 tablet (40 mg total) by mouth as needed for edema.   losartan-hydrochlorothiazide (HYZAAR) 50-12.5 MG tablet TAKE ONE TABLET BY MOUTH DAILY FOR BLOOD PRESSURE   Omega-3 Fatty Acids (FISH OIL) 1000 MG CAPS Take 1,000 mg  by mouth 2 (two) times daily.   pregabalin (LYRICA) 150 MG capsule Take  1 capsule  at Bedtime  for Sleep & Chronic Pain                                   /               TAKE                                   BY                              MOUTH   rosuvastatin (CRESTOR) 20 MG tablet Take 1 tablet MWF for Cholesterol   sulfamethoxazole-trimethoprim (BACTRIM DS) 800-160 MG tablet Take 1 tablet by mouth 2 (two) times daily.   timolol (TIMOPTIC) 0.5 % ophthalmic solution Place 1 drop into both eyes every morning.    zinc gluconate 50 MG tablet Take 50 mg by mouth daily.   No current facility-administered medications on file prior to visit.    ROS: all negative except what is noted in the HPI.   Physical Exam:  BP (!) 140/62   Pulse 63   Temp 97.6 F (36.4 C)   Ht 5\' 3"  (1.6 m)   Wt 173 lb (78.5 kg)   SpO2 98%   BMI 30.65 kg/m   General Appearance: NAD.  Awake, conversant and cooperative. Eyes: PERRLA, EOMs intact.  Sclera white.  Conjunctiva without erythema. Sinuses: No frontal/maxillary tenderness.  No nasal discharge. Nares patent.  ENT/Mouth: Ext aud canals clear.  Bilateral TMs w/DOL and without erythema or bulging. Hearing intact.  Posterior pharynx without swelling or exudate.  Tonsils without swelling or erythema.  Neck: Supple.  No masses, nodules or thyromegaly. Respiratory: Effort is regular with non-labored breathing. Breath sounds are equal bilaterally without rales, rhonchi, wheezing or stridor.  Cardio: RRR with no MRGs. Brisk peripheral pulses without edema.  Abdomen: Active BS in all four quadrants.  Soft and non-tender without guarding, rebound tenderness, hernias or masses. Lymphatics: Non tender without lymphadenopathy.  Musculoskeletal: Full ROM, 5/5 strength, normal ambulation.  No clubbing or cyanosis. Skin: Appropriate color for ethnicity. Warm without rashes, lesions, ecchymosis, ulcers.  Neuro: CN II-XII grossly normal. Normal muscle tone without cerebellar  symptoms and intact sensation.   Psych: AO X 3,  appropriate mood and affect, insight and judgment.     Adela Glimpse, NP 3:50 PM [facility]

## 2023-01-05 NOTE — Patient Instructions (Signed)
Urinary Tract Infection, Adult A urinary tract infection (UTI) is an infection of any part of the urinary tract. The urinary tract includes: The kidneys. The ureters. The bladder. The urethra. These organs make, store, and get rid of pee (urine) in the body. What are the causes? This infection is caused by germs (bacteria) in your genital area. These germs grow and cause swelling (inflammation) of your urinary tract. What increases the risk? The following factors may make you more likely to develop this condition: Using a small, thin tube (catheter) to drain pee. Not being able to control when you pee or poop (incontinence). Being female. If you are female, these things can increase the risk: Using these methods to prevent pregnancy: A medicine that kills sperm (spermicide). A device that blocks sperm (diaphragm). Having low levels of a female hormone (estrogen). Being pregnant. You are more likely to develop this condition if: You have genes that add to your risk. You are sexually active. You take antibiotic medicines. You have trouble peeing because of: A prostate that is bigger than normal, if you are female. A blockage in the part of your body that drains pee from the bladder. A kidney stone. A nerve condition that affects your bladder. Not getting enough to drink. Not peeing often enough. You have other conditions, such as: Diabetes. A weak disease-fighting system (immune system). Sickle cell disease. Gout. Injury of the spine. What are the signs or symptoms? Symptoms of this condition include: Needing to pee right away. Peeing small amounts often. Pain or burning when peeing. Blood in the pee. Pee that smells bad or not like normal. Trouble peeing. Pee that is cloudy. Fluid coming from the vagina, if you are female. Pain in the belly or lower back. Other symptoms include: Vomiting. Not feeling hungry. Feeling mixed up (confused). This may be the first symptom in  older adults. Being tired and grouchy (irritable). A fever. Watery poop (diarrhea). How is this treated? Taking antibiotic medicine. Taking other medicines. Drinking enough water. In some cases, you may need to see a specialist. Follow these instructions at home:  Medicines Take over-the-counter and prescription medicines only as told by your doctor. If you were prescribed an antibiotic medicine, take it as told by your doctor. Do not stop taking it even if you start to feel better. General instructions Make sure you: Pee until your bladder is empty. Do not hold pee for a long time. Empty your bladder after sex. Wipe from front to back after peeing or pooping if you are a female. Use each tissue one time when you wipe. Drink enough fluid to keep your pee pale yellow. Keep all follow-up visits. Contact a doctor if: You do not get better after 1-2 days. Your symptoms go away and then come back. Get help right away if: You have very bad back pain. You have very bad pain in your lower belly. You have a fever. You have chills. You feeling like you will vomit or you vomit. Summary A urinary tract infection (UTI) is an infection of any part of the urinary tract. This condition is caused by germs in your genital area. There are many risk factors for a UTI. Treatment includes antibiotic medicines. Drink enough fluid to keep your pee pale yellow. This information is not intended to replace advice given to you by your health care provider. Make sure you discuss any questions you have with your health care provider. Document Revised: 03/29/2020 Document Reviewed: 03/29/2020 Elsevier Patient Education    2023 Elsevier Inc.  

## 2023-01-06 ENCOUNTER — Ambulatory Visit (INDEPENDENT_AMBULATORY_CARE_PROVIDER_SITE_OTHER): Payer: Medicare HMO | Admitting: Nurse Practitioner

## 2023-01-06 ENCOUNTER — Encounter: Payer: Self-pay | Admitting: Nurse Practitioner

## 2023-01-06 VITALS — BP 130/70 | HR 65 | Temp 97.9°F | Resp 16 | Ht 63.0 in | Wt 169.2 lb

## 2023-01-06 DIAGNOSIS — R7309 Other abnormal glucose: Secondary | ICD-10-CM

## 2023-01-06 DIAGNOSIS — I10 Essential (primary) hypertension: Secondary | ICD-10-CM | POA: Diagnosis not present

## 2023-01-06 DIAGNOSIS — E538 Deficiency of other specified B group vitamins: Secondary | ICD-10-CM

## 2023-01-06 DIAGNOSIS — Z79899 Other long term (current) drug therapy: Secondary | ICD-10-CM

## 2023-01-06 DIAGNOSIS — F411 Generalized anxiety disorder: Secondary | ICD-10-CM

## 2023-01-06 DIAGNOSIS — E559 Vitamin D deficiency, unspecified: Secondary | ICD-10-CM | POA: Diagnosis not present

## 2023-01-06 DIAGNOSIS — E782 Mixed hyperlipidemia: Secondary | ICD-10-CM | POA: Diagnosis not present

## 2023-01-06 DIAGNOSIS — Z0000 Encounter for general adult medical examination without abnormal findings: Secondary | ICD-10-CM

## 2023-01-06 DIAGNOSIS — G20A2 Parkinson's disease without dyskinesia, with fluctuations: Secondary | ICD-10-CM

## 2023-01-06 DIAGNOSIS — N390 Urinary tract infection, site not specified: Secondary | ICD-10-CM | POA: Diagnosis not present

## 2023-01-06 DIAGNOSIS — Z136 Encounter for screening for cardiovascular disorders: Secondary | ICD-10-CM | POA: Diagnosis not present

## 2023-01-06 DIAGNOSIS — E663 Overweight: Secondary | ICD-10-CM

## 2023-01-06 DIAGNOSIS — Z0001 Encounter for general adult medical examination with abnormal findings: Secondary | ICD-10-CM | POA: Diagnosis not present

## 2023-01-06 DIAGNOSIS — H4010X0 Unspecified open-angle glaucoma, stage unspecified: Secondary | ICD-10-CM

## 2023-01-06 DIAGNOSIS — M47812 Spondylosis without myelopathy or radiculopathy, cervical region: Secondary | ICD-10-CM

## 2023-01-06 LAB — URINALYSIS, ROUTINE W REFLEX MICROSCOPIC
Glucose, UA: NEGATIVE
RBC / HPF: NONE SEEN /HPF (ref 0–2)

## 2023-01-06 LAB — CBC WITH DIFFERENTIAL/PLATELET
Absolute Monocytes: 615 cells/uL (ref 200–950)
Basophils Absolute: 41 cells/uL (ref 0–200)
Eosinophils Absolute: 139 cells/uL (ref 15–500)
HCT: 40.1 % (ref 35.0–45.0)
Hemoglobin: 13.2 g/dL (ref 11.7–15.5)
Lymphs Abs: 1357 cells/uL (ref 850–3900)
MCH: 30.2 pg (ref 27.0–33.0)
Platelets: 267 10*3/uL (ref 140–400)
Total Lymphocyte: 23.4 %
WBC: 5.8 10*3/uL (ref 3.8–10.8)

## 2023-01-06 NOTE — Patient Instructions (Signed)

## 2023-01-06 NOTE — Progress Notes (Signed)
CPE  Assessment:   [name] was seen today for follow-up and medicare wellness.  Diagnoses and all orders for this visit:  Annual Physical  Due annually  Health maintenance reviewed  Essential hypertension Discussed DASH (Dietary Approaches to Stop Hypertension) DASH diet is lower in sodium than a typical American diet. Cut back on foods that are high in saturated fat, cholesterol, and trans fats. Eat more whole-grain foods, fish, poultry, and nuts Remain active and exercise as tolerated daily.  Monitor BP at home-Call if greater than 130/80.  Check CMP/CBC  Parkinson's Disease (HCC) Dr. Arbutus [name] is managing, on sinemet IR Encourage regular exercise/balance program  Patient denies needs at this time  Hyperlipidemia, mixed Discussed lifestyle modifications. Recommended diet heavy in fruits and veggies, omega 3's. Decrease consumption of animal meats, cheeses, and dairy products. Remain active and exercise as tolerated. Continue to monitor. Check lipids/TSH  Abnormal glucose Education: Reviewed 'ABCs' of diabetes management  Discussed goals to be met and/or maintained include A1C (<7) Blood pressure (<130/80) Cholesterol (LDL <70) Continue Eye Exam yearly  Continue Dental Exam Q6 mo Discussed dietary recommendations Discussed Physical Activity recommendations Check A1C  Overweight (BMI 25.0-29.9) Discussed appropriate BMI Diet modification. Physical activity. Encouraged/praised to build confidence.  Generalized anxiety disorder Doing well on current regimen, rare xanax use, monitor PDMP with refill requests Discussed stress management techniques   Discussed good sleep hygiene Discussed increasing physical activity and exercise Increase water intake  Open-angle glaucoma, unspecified glaucoma stage, unspecified laterality, unspecified open-angle glaucoma type Continue gtts as prescribed Yearly eye exams Continue to monitor  Cervical arthritis/radicular  pain Controlled Continue to monitor Ortho following  Vitamin D deficiency Continue supplement for goal of 60-100 Monitor Vitamin D levels  Medication management All medications discussed and reviewed in full. All questions and concerns regarding medications addressed.   Recurrent UTI Stay well hydrated to keep urinary system well flushed. Consider cranberry supplement Continue to monitor  B12 Deficiency Monitor levels  Continue supplement   Orders Placed This Encounter  Procedures   CBC with Differential/Platelet   COMPLETE METABOLIC PANEL WITH GFR   Magnesium   Lipid panel   TSH   Hemoglobin A1c   Insulin, random   VITAMIN D 25 Hydroxy (Vit-D Deficiency, Fractures)   Microalbumin / creatinine urine ratio   Vitamin B12   EKG 12-Lead    Notify office for further evaluation and treatment, questions or concerns if any reported s/s fail to improve.   The patient was advised to call back or seek an in-person evaluation if any symptoms worsen or if the condition fails to improve as anticipated.   Further disposition pending results of labs. Discussed med's effects and SE's.    I discussed the assessment and treatment plan with the patient. The patient was provided an opportunity to ask questions and all were answered. The patient agreed with the plan and demonstrated an understanding of the instructions.  Discussed med's effects and SE's. Screening labs and tests as requested with regular follow-up as recommended.  I provided 35 minutes of face-to-face time during this encounter including counseling, chart review, and critical decision making was preformed.  Today's Plan of Care is based on a patient-centered health care approach known as shared decision making - the decisions, tests and treatments allow for patient preferences and values to be balanced with clinical evidence.     Future Appointments  Date Time Provider Department Center  03/24/2023  9:30 AM Raynelle Dick, NP [facility] None  07/06/2023  2:30 PM Tat, Octaviano Batty, DO [facility] None    Plan:   During the course of the visit the patient was educated and counseled about appropriate screening and preventive services including:   Pneumococcal vaccine  Influenza vaccine Td vaccine Prevnar 13 Screening electrocardiogram Screening mammography Bone densitometry screening Colorectal cancer screening Diabetes screening Glaucoma screening Nutrition counseling  Advanced directives: given info/requested copies   Subjective:   Gwendolyn [name] is a 82 y.o. female who presents for Medicare Annual Wellness Visit and 3 month follow up on hypertension, prediabetes, hyperlipidemia, vitamin D def.  Overall she reports feeling well today.  She was seen in office yesterday for UTI symptoms.  UA reveals trace of leukocytes, no nitrates, awaiting culture.  Started prophylactic tmt with Cipro.  This has been her third UTI in the past 6 months.  Denies fever, chills, N/V, AMS.  She has hx of anxiety, has been prescribed xanax though improved.   She reports last use was last week, will go months without needing.   Cervical arthritis/radicular pain, managing fairly with lifestyle modification (sleeping on back, flatter pillow).  Managing with occasional NSAID use.  Saw [facility] 07/13/22 for f/u on difficulty walking and lifting left leg when right leg is planted, feels as though she is waddling.  This is d/t lack of stabililty in WB on RLE.  Has weakness in right hip flexors and abduction and weakness in ERs.  Discussed that she would benefit from PT for skilled interventions to work on balance, gait and right hip strengthening.  X2/week for 4 weeks.  She has completed.  She saw neurology and feels as though she does not have a shuffling gait.  Pain is more orthopedics.    She reports having numb toes, burning in left calf/leg pain, having some difficulty walking due to this, has discussed with est  ortho Dr. Charlann Boxer, did PT without improvement, has OV next week to discuss further.   She was evaluated by Dr. Arbutus [name] due to change in gait, pill rolling, was formally dx with Parkinson's started on sinemet IR TID, patient hasn't noted particular changes with this but reports minimal sx to begin with, managing well.   BMI is Body mass index is 29.97 kg/m., she has been working on diet and exercise, generally active, admits to emotional eating, eating too many cookies.  Wt Readings from Last 3 Encounters:  01/06/23 169 lb 3.2 oz (76.7 kg)  01/05/23 173 lb (78.5 kg)  12/29/22 171 lb (77.6 kg)   Her blood pressure has been controlled at home, today their BP is BP: 130/70  She does not workout. She denies chest pain, shortness of breath, dizziness. She takes lasix AS needed for swelling.    She is on cholesterol medication and has myalgias with crestor (20 mg three times a week) on the days she takes it. Her cholesterol is at goal. The cholesterol last visit was:   Lab Results  Component Value Date   CHOL 177 08/26/2022   HDL 84 08/26/2022   LDLCALC 77 08/26/2022   TRIG 76 08/26/2022   CHOLHDL 2.1 08/26/2022    She has been working on diet and exercise for glucose management, and denies paresthesia of the feet, polydipsia, polyuria and visual disturbances. Last A1C in the office was:  Lab Results  Component Value Date   HGBA1C 5.3 05/22/2022   Lab Results  Component Value Date   EGFR 76 08/26/2022   Patient is on Vitamin D supplement.   Lab Results  Component Value Date   VD25OH 64 05/22/2022       Medication Review Current Outpatient Medications on File Prior to Visit  Medication Sig   ALPRAZolam (XANAX) 0.5 MG tablet TAKE 1/2 TO 1 (ONE-HALF TO ONE) TABLET BY MOUTH THREE TIMES DAILY AS NEEDED FOR ANXIETY   ascorbic acid (VITAMIN C) 500 MG tablet Take 500 mg by mouth daily.   carbidopa-levodopa (SINEMET IR) 25-100 MG tablet TAKE ONE TABLET BY MOUTH THREE TIMES A DAY (AT 7AM, 11AM,  AND 4PM)   CHELATED MAGNESIUM PO Take 250 mg by mouth 2 (two) times daily.   cholecalciferol (VITAMIN D3) 25 MCG (1000 UNIT) tablet Take 1,000 Units by mouth in the morning and at bedtime.   ciprofloxacin (CIPRO) 250 MG tablet Take 1 tablet 2 x /day with Food for Infection   cyanocobalamin (VITAMIN B12) 1000 MCG tablet Take 1,000 mcg by mouth daily.   fexofenadine (ALLEGRA) 180 MG tablet Take 180 mg by mouth daily as needed for allergies or rhinitis.   furosemide (LASIX) 40 MG tablet Take 1 tablet (40 mg total) by mouth as needed for edema.   losartan-hydrochlorothiazide (HYZAAR) 50-12.5 MG tablet TAKE ONE TABLET BY MOUTH DAILY FOR BLOOD PRESSURE   Omega-3 Fatty Acids (FISH OIL) 1000 MG CAPS Take 1,000 mg by mouth 2 (two) times daily.   pregabalin (LYRICA) 150 MG capsule Take  1 capsule  at Bedtime  for Sleep & Chronic Pain                                   /               TAKE                                   BY                              MOUTH   rosuvastatin (CRESTOR) 20 MG tablet Take 1 tablet MWF for Cholesterol   timolol (TIMOPTIC) 0.5 % ophthalmic solution Place 1 drop into both eyes every morning.    zinc gluconate 50 MG tablet Take 50 mg by mouth daily.   No current facility-administered medications on file prior to visit.    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   Parkinson's disease 11/28/2021   B12 deficiency 05/28/2021   Cervical arthritis 09/27/2017   Open-angle glaucoma 05/14/2016   Generalized anxiety disorder 02/11/2016   Overweight (BMI 25.0-29.9) 04/25/2015   Medication management 02/12/2014   Essential hypertension 08/08/2013   Hyperlipidemia, mixed 08/08/2013   Abnormal glucose 08/08/2013   Vitamin D deficiency 08/08/2013    Screening Tests Immunization History  Administered Date(s) Administered   Fluad Quad(high Dose 65+) 05/23/2021   Influenza, High Dose Seasonal PF  06/06/2014, 05/14/2016, 06/24/2018, 06/05/2019, 06/13/2020, 06/29/2022   Influenza-Unspecified 06/13/2013, 05/02/2015, 06/24/2018   Moderna Sars-Covid-2 Vaccination 09/13/2019, 10/11/2019, 07/01/2020, 01/17/2021   Pneumococcal Conjugate-13 05/24/2014   Pneumococcal Polysaccharide-23 01/08/2009   Td 02/12/2022   Tdap 01/28/2012   Zoster Recombinat (Shingrix) 02/24/2022, 06/29/2022   Zoster, Live 08/31/2006   Health Maintenance  Topic Date Due   COVID-19 Vaccine (5 - 2023-24 season) 05/01/2022   MAMMOGRAM  12/17/2022   Medicare  Annual Wellness (AWV)  02/13/2023   INFLUENZA VACCINE  04/01/2023   DTaP/Tdap/Td (3 - Td or Tdap) 02/13/2032   Pneumonia Vaccine 77+ Years old  Completed   DEXA SCAN  Completed   Zoster Vaccines- Shingrix  Completed   HPV VACCINES  Aged Out   Last colonoscopy: 2018, benign polyps, follow up PRN only per Dr. Adela Lank Cologuard 2018 (positive)  Last mammogram: 12/2022 Last pap smear/pelvic exam: 2019 Dr. Ambrose Mantle, declines another  DEXA: 12/2020 normal, T -0.8 Due  TD or Tdap: 12/2021 Shringrix: Discussed with patient, plans to get the pharmacy Covid 19: 2/2 + booster 12/2020  Names of Other Physician/Practitioners you currently use: 1. [facility]- here for primary care 2. Dr. Rodman Pickle opht, eye doctor, last visit 12/2021, low pressure glaucoma 3. Dr. Marquis Lunch, dentist, last visit 12/2021, q21m  Patient Care Team: Lucky Cowboy, MD as PCP - General (Internal Medicine) Hart Carwin, MD (Inactive) as Consulting Physician (Gastroenterology) Tracey Harries, MD as Consulting Physician (Obstetrics and Gynecology)   Allergies Allergies  Allergen Reactions   Oxycodone-Acetaminophen Other (See Comments)    panic attack   Vit C-Cholecalciferol-Rose Hip [Cholecalciferol-Vitamin C] Other (See Comments)    dyspepsia   Ibuprofen Hives   Macrodantin [Nitrofurantoin Macrocrystal] Rash    SURGICAL HISTORY She  has  a past surgical history that includes right shoulder humerus fx (july 2003); right hand ulner nerve surgery (March 26, 2002); right elbow   surgery (March 26, 2002); right wrist plating (2004); right total hip arthroplasty (2008); right total knee arthroplasty (sept 2012); Tonsillectomy (age 49); Total hip arthroplasty (10/22/2011); Colonoscopy; Dilation and curettage of uterus; and Total knee arthroplasty (Left, 04/28/2022). FAMILY HISTORY Her family history includes Atrial fibrillation in her brother, brother, and mother; Breast cancer in her maternal aunt, maternal grandmother, and paternal aunt; Congestive Heart Failure in her mother; Diabetes in her father and mother; Heart attack (age of onset: 43) in her father; Heart disease in her father and mother; Hypertension in her mother; Tremor in her father and paternal grandmother. SOCIAL HISTORY She  reports that she has never smoked. She has never used smokeless tobacco. She reports current alcohol use. She reports that she does not use drugs.  Objective:   Today's Vitals   01/06/23 0903  BP: 130/70  Pulse: 65  Resp: 16  Temp: 97.9 F (36.6 C)  SpO2: 97%  Weight: 169 lb 3.2 oz (76.7 kg)  Height: 5\' 3"  (1.6 m)   General Appearance: Well nourished, in no apparent distress. Eyes: PERRLA, EOMs, conjunctiva no swelling or erythema Sinuses: No Frontal/maxillary tenderness ENT/Mouth: Ext aud canals bil obstructed by wax. No erythema, swelling, or exudate on post pharynx.  Tonsils not swollen or erythematous. Mildly HOH.  Neck: Supple, thyroid normal.  Respiratory: Respiratory effort normal, BS equal bilaterally without rales, rhonchi, wheezing or stridor.  Cardio: RRR with no MRGs. Brisk peripheral pulses without edema.  Abdomen: Soft, + BS.  Non tender, no guarding, rebound, hernias, masses. Lymphatics: Non tender without lymphadenopathy.  Musculoskeletal: Full ROM, 5/5 strength, mild valgus of left knee, mildly antalgic/unsteady gait. No  effusion, laxity. Neg straight leg raise.  Skin: Warm, dry without rashes, lesions, ecchymosis. Left toenails thickened (reports did lamisil, declines further). Some callus of left toes.  Neuro: Cranial nerves intact. No cerebellar symptoms. Mildly decreased sensation to monofilament toe tips, otherwise intact. No tremor, cogwheeling.  Psych: Awake and oriented X 3, normal affect, Insight and Judgment appropriate.   =  Maila Dukes  Zelia Yzaguirre, NP   01/06/2023

## 2023-01-07 ENCOUNTER — Other Ambulatory Visit: Payer: Self-pay | Admitting: Internal Medicine

## 2023-01-07 DIAGNOSIS — R928 Other abnormal and inconclusive findings on diagnostic imaging of breast: Secondary | ICD-10-CM

## 2023-01-07 LAB — HEMOGLOBIN A1C
Hgb A1c MFr Bld: 5.6 % of total Hgb (ref <5.7)
Mean Plasma Glucose: 114 mg/dL
eAG (mmol/L): 6.3 mmol/L

## 2023-01-07 LAB — COMPLETE METABOLIC PANEL WITH GFR
AG Ratio: 1.6 (calc) (ref 1.0–2.5)
ALT: 7 U/L (ref 6–29)
AST: 23 U/L (ref 10–35)
Albumin: 4.2 g/dL (ref 3.6–5.1)
Alkaline phosphatase (APISO): 88 U/L (ref 37–153)
BUN/Creatinine Ratio: 34 (calc) — ABNORMAL HIGH (ref 6–22)
BUN: 31 mg/dL — ABNORMAL HIGH (ref 7–25)
CO2: 30 mmol/L (ref 20–32)
Calcium: 9.4 mg/dL (ref 8.6–10.4)
Chloride: 103 mmol/L (ref 98–110)
Creat: 0.91 mg/dL (ref 0.60–0.95)
Globulin: 2.6 g/dL (calc) (ref 1.9–3.7)
Glucose, Bld: 86 mg/dL (ref 65–99)
Potassium: 4.2 mmol/L (ref 3.5–5.3)
Sodium: 142 mmol/L (ref 135–146)
Total Bilirubin: 0.5 mg/dL (ref 0.2–1.2)
Total Protein: 6.8 g/dL (ref 6.1–8.1)
eGFR: 64 mL/min/{1.73_m2} (ref >=60)

## 2023-01-07 LAB — CBC WITH DIFFERENTIAL/PLATELET
Basophils Relative: 0.7 %
Eosinophils Relative: 2.4 %
MCHC: 32.9 g/dL (ref 32.0–36.0)
MCV: 91.8 fL (ref 80.0–100.0)
MPV: 11.7 fL (ref 7.5–12.5)
Monocytes Relative: 10.6 %
Neutro Abs: 3648 cells/uL (ref 1500–7800)
Neutrophils Relative %: 62.9 %
RBC: 4.37 10*6/uL (ref 3.80–5.10)
RDW: 12.9 % (ref 11.0–15.0)

## 2023-01-07 LAB — LIPID PANEL
Cholesterol: 160 mg/dL (ref <200)
HDL: 67 mg/dL (ref >=50)
LDL Cholesterol (Calc): 76 mg/dL (calc)
Non-HDL Cholesterol (Calc): 93 mg/dL (calc) (ref <130)
Total CHOL/HDL Ratio: 2.4 (calc) (ref <5.0)
Triglycerides: 83 mg/dL (ref <150)

## 2023-01-07 LAB — MICROALBUMIN / CREATININE URINE RATIO
Creatinine, Urine: 98 mg/dL (ref 20–275)
Microalb, Ur: <0.2 mg/dL

## 2023-01-07 LAB — TSH: TSH: 1.02 mIU/L (ref 0.40–4.50)

## 2023-01-07 LAB — VITAMIN B12: Vitamin B-12: 671 pg/mL (ref 200–1100)

## 2023-01-07 LAB — VITAMIN D 25 HYDROXY (VIT D DEFICIENCY, FRACTURES): Vit D, 25-Hydroxy: 81 ng/mL (ref 30–100)

## 2023-01-07 LAB — MAGNESIUM: Magnesium: 2.3 mg/dL (ref 1.5–2.5)

## 2023-01-07 LAB — INSULIN, RANDOM: Insulin: 6.5 u[IU]/mL

## 2023-01-08 LAB — URINALYSIS, ROUTINE W REFLEX MICROSCOPIC
Bacteria, UA: NONE SEEN /HPF
Bilirubin Urine: NEGATIVE
Hgb urine dipstick: NEGATIVE
Hyaline Cast: NONE SEEN /LPF
Ketones, ur: NEGATIVE
Nitrite: NEGATIVE
Protein, ur: NEGATIVE
Specific Gravity, Urine: 1.008 (ref 1.001–1.035)
Squamous Epithelial / HPF: NONE SEEN /HPF (ref <=5)
pH: 5.5 (ref 5.0–8.0)

## 2023-01-08 LAB — MICROSCOPIC MESSAGE

## 2023-01-08 LAB — URINE CULTURE
MICRO NUMBER:: 14926186
SPECIMEN QUALITY:: ADEQUATE

## 2023-01-19 ENCOUNTER — Ambulatory Visit: Payer: Medicare HMO

## 2023-01-19 ENCOUNTER — Ambulatory Visit
Admission: RE | Admit: 2023-01-19 | Discharge: 2023-01-19 | Disposition: A | Discharge status: Home / Self Care | Payer: Medicare HMO | Source: Ambulatory Visit | Attending: Internal Medicine | Admitting: Internal Medicine

## 2023-01-19 DIAGNOSIS — R92331 Mammographic heterogeneous density, right breast: Secondary | ICD-10-CM | POA: Diagnosis not present

## 2023-01-19 DIAGNOSIS — R922 Inconclusive mammogram: Secondary | ICD-10-CM | POA: Diagnosis not present

## 2023-01-19 DIAGNOSIS — R928 Other abnormal and inconclusive findings on diagnostic imaging of breast: Secondary | ICD-10-CM

## 2023-02-15 ENCOUNTER — Encounter: Payer: Self-pay | Admitting: Neurology

## 2023-02-15 ENCOUNTER — Ambulatory Visit: Payer: Medicare HMO | Admitting: Nurse Practitioner

## 2023-02-18 ENCOUNTER — Ambulatory Visit
Admission: RE | Admit: 2023-02-18 | Discharge: 2023-02-18 | Disposition: A | Discharge status: Home / Self Care | Payer: Medicare HMO | Source: Ambulatory Visit | Attending: Neurology | Admitting: Neurology

## 2023-02-18 DIAGNOSIS — S76019A Strain of muscle, fascia and tendon of unspecified hip, initial encounter: Secondary | ICD-10-CM

## 2023-02-18 DIAGNOSIS — R29898 Other symptoms and signs involving the musculoskeletal system: Secondary | ICD-10-CM

## 2023-02-18 DIAGNOSIS — M7062 Trochanteric bursitis, left hip: Secondary | ICD-10-CM | POA: Diagnosis not present

## 2023-02-18 DIAGNOSIS — M7061 Trochanteric bursitis, right hip: Secondary | ICD-10-CM | POA: Diagnosis not present

## 2023-02-18 DIAGNOSIS — M47816 Spondylosis without myelopathy or radiculopathy, lumbar region: Secondary | ICD-10-CM | POA: Diagnosis not present

## 2023-02-18 DIAGNOSIS — Z96643 Presence of artificial hip joint, bilateral: Secondary | ICD-10-CM | POA: Diagnosis not present

## 2023-03-05 ENCOUNTER — Other Ambulatory Visit: Payer: Self-pay | Admitting: Neurology

## 2023-03-05 ENCOUNTER — Ambulatory Visit: Payer: Medicare HMO | Admitting: Neurology

## 2023-03-05 ENCOUNTER — Encounter: Payer: Self-pay | Admitting: Neurology

## 2023-03-05 ENCOUNTER — Other Ambulatory Visit: Payer: Medicare HMO

## 2023-03-05 VITALS — BP 132/76 | HR 66 | Ht 65.0 in | Wt 173.4 lb

## 2023-03-05 DIAGNOSIS — M791 Myalgia, unspecified site: Secondary | ICD-10-CM

## 2023-03-05 DIAGNOSIS — G20A1 Parkinson's disease without dyskinesia, without mention of fluctuations: Secondary | ICD-10-CM | POA: Diagnosis not present

## 2023-03-05 DIAGNOSIS — M5416 Radiculopathy, lumbar region: Secondary | ICD-10-CM | POA: Diagnosis not present

## 2023-03-05 LAB — CLIENT EDUCATION TRACKING

## 2023-03-05 NOTE — Patient Instructions (Signed)
Your provider has requested that you have labwork completed today. The lab is located on the Second floor at [address], within the [facility] office. When you get off the elevator, turn right and go in the [facility] [address]; the first brown door on the left.  Tell the ladies behind the desk that you are there for lab work. If you are not called within 15 minutes please check with the front desk.   Once you complete your labs you are free to go. You will receive a call or message via MyChart with your lab results.    A referral to [facility] has been placed for your MRI someone will contact you directly to schedule your appt. They are located at [address] Please contact them directly by calling [phone] with any questions regarding your referral.  

## 2023-03-05 NOTE — Progress Notes (Signed)
Assessment/Plan:   1.  Parkinsons Disease  -DaTscan was abnormal November 25, 2021 with decreased radiotracer activity bilaterally, right greater than left.  -she will continue carbidopa/levodopa 25/100, 1 tablet 3 times per day.  2.  B12 deficiency  -Now on oral supplementation.  3.  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  -Patient does have mild evidence of peripheral neuropathy, which could explain the paresthesias of her toes.  This does not, however, explain her gait, nor does Parkinson's disease.  4.  Severe atrophy - R iliopsoas, R glut medius and minimus  -pt thinks sx's x close to 3 years with balance/ambulation issues  -MRI lumbar spine without contrast  -CPK today  -EMG of the RLE   Subjective:   Gwendolyn [name] was seen today in follow up for Parkinsons disease.  My previous records were reviewed prior to todays visit as well as outside records available to me.  Pt worked in today to discuss MRI pelvis.  There was severe atrophy of the R iliopsoas, R glut minimus and medius muscles.  Pt states that "my back does bother me at the end of the day but I thought it was because of how I walked."  Current prescribed movement disorder medications: Carbidopa/levodopa 25/100, 1 tablet 3 times per day (7-8am/11am/4pm)   ALLERGIES:   Allergies  Allergen Reactions   Oxycodone-Acetaminophen Other (See Comments)    panic attack   Vit C-Cholecalciferol-Rose Hip [Cholecalciferol-Vitamin C] Other (See Comments)    dyspepsia   Ibuprofen Hives   Macrodantin [Nitrofurantoin Macrocrystal] Rash    CURRENT MEDICATIONS:  Current Meds  Medication Sig   ALPRAZolam (XANAX) 0.5 MG tablet TAKE 1/2 TO 1 (ONE-HALF TO ONE) TABLET BY MOUTH THREE TIMES DAILY AS NEEDED FOR ANXIETY   ascorbic acid (VITAMIN C) 500 MG tablet Take 500 mg by mouth daily.   carbidopa-levodopa (SINEMET IR) 25-100 MG tablet TAKE ONE TABLET BY MOUTH THREE TIMES A DAY (AT 7AM, 11AM, AND 4PM)   CHELATED MAGNESIUM PO Take  250 mg by mouth 2 (two) times daily.   cholecalciferol (VITAMIN D3) 25 MCG (1000 UNIT) tablet Take 1,000 Units by mouth in the morning and at bedtime.   cyanocobalamin (VITAMIN B12) 1000 MCG tablet Take 1,000 mcg by mouth daily.   fexofenadine (ALLEGRA) 180 MG tablet Take 180 mg by mouth daily as needed for allergies or rhinitis.   furosemide (LASIX) 40 MG tablet Take 1 tablet (40 mg total) by mouth as needed for edema.   losartan-hydrochlorothiazide (HYZAAR) 50-12.5 MG tablet TAKE ONE TABLET BY MOUTH DAILY FOR BLOOD PRESSURE   Omega-3 Fatty Acids (FISH OIL) 1000 MG CAPS Take 1,000 mg by mouth 2 (two) times daily.   pregabalin (LYRICA) 150 MG capsule Take  1 capsule  at Bedtime  for Sleep & Chronic Pain                                   /               TAKE                                   BY                              MOUTH   rosuvastatin (CRESTOR) 20 MG  tablet Take 1 tablet MWF for Cholesterol   timolol (TIMOPTIC) 0.5 % ophthalmic solution Place 1 drop into both eyes every morning.    zinc gluconate 50 MG tablet Take 50 mg by mouth daily.     Objective:   PHYSICAL EXAMINATION:    VITALS:   Vitals:   03/05/23 1252  BP: 132/76  Pulse: 66  SpO2: 91%  Weight: 173 lb 6.4 oz (78.7 kg)  Height: 5\' 5"  (1.651 m)      GEN:  The patient appears stated age and is in NAD. HEENT:  Normocephalic.  Healing/old area of ecchymosis around the R eye  The mucous membranes are moist. The superficial temporal arteries are without ropiness or tenderness. CV:  RRR Lungs:  CTAB Neck/HEME:  There are no carotid bruits bilaterally.  Neurological examination:  Orientation: The patient is alert and oriented x3. Cranial nerves: There is good facial symmetry without facial hypomimia. The speech is fluent and clear. Soft palate rises symmetrically and there is no tongue deviation. Hearing is intact to conversational tone. Sensation: Sensation is intact to light touch throughout Motor: Strength is at  least antigravity x4.  Movement examination: Tone: There is no increased tone (nl bilaterally) Abnormal movements: none even with distraction in the hands/legs.  Has some mouth tremor Coordination:  There is no decremation, with any form of RAMS, including alternating supination and pronation of the forearm, hand opening and closing, finger taps, heel taps and toe taps. Gait and Station: The patient pushes off to arise.  Walks with cane.  She is antalgic.  She continues to have evidence of gluteal muscle weakness on the right.  No shuffling.    I have reviewed and interpreted the following labs independently    Chemistry      Component Value Date/Time   NA 142 01/06/2023 0945   K 4.2 01/06/2023 0945   CL 103 01/06/2023 0945   CO2 30 01/06/2023 0945   BUN 31 (H) 01/06/2023 0945   CREATININE 0.91 01/06/2023 0945      Component Value Date/Time   CALCIUM 9.4 01/06/2023 0945   ALKPHOS 87 03/31/2017 0927   AST 23 01/06/2023 0945   ALT 7 01/06/2023 0945   BILITOT 0.5 01/06/2023 0945       Lab Results  Component Value Date   WBC 5.8 01/06/2023   HGB 13.2 01/06/2023   HCT 40.1 01/06/2023   MCV 91.8 01/06/2023   PLT 267 01/06/2023    Lab Results  Component Value Date   TSH 1.02 01/06/2023   Total time spent on today's visit was 31 minutes, including both face-to-face time and nonface-to-face time.  Time included that spent on review of records (prior notes available to me/labs/imaging if pertinent), discussing treatment and goals, answering patient's questions and coordinating care.    Cc:  Lucky Cowboy, MD

## 2023-03-08 ENCOUNTER — Ambulatory Visit
Admission: RE | Admit: 2023-03-08 | Discharge: 2023-03-08 | Disposition: A | Discharge status: Home / Self Care | Payer: Medicare HMO | Source: Ambulatory Visit | Attending: Neurology | Admitting: Neurology

## 2023-03-08 ENCOUNTER — Encounter: Payer: Self-pay | Admitting: Neurology

## 2023-03-08 DIAGNOSIS — Z961 Presence of intraocular lens: Secondary | ICD-10-CM | POA: Diagnosis not present

## 2023-03-08 DIAGNOSIS — M48061 Spinal stenosis, lumbar region without neurogenic claudication: Secondary | ICD-10-CM | POA: Diagnosis not present

## 2023-03-08 DIAGNOSIS — H401131 Primary open-angle glaucoma, bilateral, mild stage: Secondary | ICD-10-CM | POA: Diagnosis not present

## 2023-03-08 DIAGNOSIS — M5136 Other intervertebral disc degeneration, lumbar region: Secondary | ICD-10-CM | POA: Diagnosis not present

## 2023-03-08 LAB — CK: Total CK: 132 U/L (ref 29–143)

## 2023-03-08 LAB — EXTRA SPECIMEN

## 2023-03-08 LAB — CK-MB(CK-2)

## 2023-03-09 ENCOUNTER — Encounter: Payer: Medicare HMO | Admitting: Internal Medicine

## 2023-03-10 ENCOUNTER — Other Ambulatory Visit: Payer: Medicare HMO

## 2023-03-12 ENCOUNTER — Ambulatory Visit: Payer: Medicare HMO | Admitting: Neurology

## 2023-03-12 DIAGNOSIS — M5416 Radiculopathy, lumbar region: Secondary | ICD-10-CM | POA: Diagnosis not present

## 2023-03-12 NOTE — Procedures (Signed)
  [facility]  [address] [place], [address]  [place], [place] [address] Tel: [phone] Fax: [phone] Test Date:  03/12/2023  Patient: Gwendolyn [name] DOB: February 25, 1941 Physician: Nita Sickle, DO  Sex: Female Height: 5\' 5"  Ref Phys: Kerin Salen, DO  ID#: [id number]   Technician:    History: This is a 82 year old female referred for evaluation of right leg weakness.  NCV & EMG Findings: Extensive electrodiagnostic testing of the right lower extremity shows:  Right sural and superficial peroneal sensory responses are within normal limits. Right peroneal and tibial motor responses are within normal limits. Right tibial H reflex study is within normal limits. Chronic motor axon loss changes are seen affecting the rectus femoris, adductor longus, iliopsoas, and gluteus medius muscles.  There is no evidence of accompanying active denervation.  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  Correlate clinically.  There is no evidence of a large fiber sensorimotor polyneuropathy.    ___________________________ Nita Sickle, DO    Nerve Conduction Studies   Stim Site NR Peak (ms) Norm Peak (ms) O-P Amp (V) Norm O-P Amp  Right Sup Peroneal Anti Sensory (Ant Lat Mall)  32 C  12 cm    2.1 <4.6 5.7 >3  Right Sural Anti Sensory (Lat Mall)  32 C  Calf    2.9 <4.6 14.8 >3     Stim Site NR Onset (ms) Norm Onset (ms) O-P Amp (mV) Norm O-P Amp Site1 Site2 Delta-0 (ms) Dist (cm) Vel (m/s) Norm Vel (m/s)  Right Peroneal Motor (Ext Dig Brev)  32 C  Ankle    2.6 <6.0 6.2 >2.5 B Fib Ankle 7.1 35.0 49 >40  B Fib    9.7  5.5  Poplt B Fib 1.9 10.0 53 >40  Poplt    11.6  5.5         Right Tibial Motor (Abd Hall Brev)  32 C  Ankle    3.4 <6.0 7.7 >4 Knee  Ankle 8.4 40.0 48 >40  Knee    11.8  5.5          Electromyography   Side Muscle Ins.Act Fibs Fasc Recrt Amp Dur Poly Activation Comment  Right AntTibialis Nml Nml Nml Nml Nml Nml Nml Nml N/A  Right Gastroc Nml Nml Nml Nml Nml Nml Nml Nml N/A  Right Flex Dig Long Nml Nml Nml Nml Nml Nml Nml Nml N/A  Right RectFemoris Nml Nml Nml *2- *1+ *1+ *1+ Nml N/A  Right BicepsFemS Nml Nml Nml Nml Nml Nml Nml Nml N/A  Right GluteusMed Nml Nml Nml *2- *1+ *1+ *1+ Nml N/A  Right Iliopsoas Nml Nml Nml *2- *1+ *1+ *1+ Nml N/A  Right AdductorLong Nml Nml Nml *2- *1+ *1+ *1+ Nml N/A      Waveforms:

## 2023-03-17 ENCOUNTER — Telehealth: Payer: Self-pay

## 2023-03-17 NOTE — Telephone Encounter (Signed)
Called patient and left message to set up a new patient appointment with Dr. Allena Katz on August 5th

## 2023-03-24 ENCOUNTER — Encounter: Payer: Self-pay | Admitting: Neurology

## 2023-03-24 ENCOUNTER — Ambulatory Visit: Payer: Medicare HMO | Admitting: Nurse Practitioner

## 2023-03-24 ENCOUNTER — Ambulatory Visit: Payer: Medicare HMO | Admitting: Neurology

## 2023-03-24 VITALS — BP 148/76 | HR 66 | Ht 65.0 in | Wt 172.0 lb

## 2023-03-24 DIAGNOSIS — G718 Other primary disorders of muscles: Secondary | ICD-10-CM | POA: Diagnosis not present

## 2023-03-24 DIAGNOSIS — R29898 Other symptoms and signs involving the musculoskeletal system: Secondary | ICD-10-CM

## 2023-03-24 NOTE — Progress Notes (Signed)
[facility]rology Division Clinic Note - Initial Visit   Date: 03/24/2023   Gwendolyn [name] MRN: [id number] DOB: 01-08-1941   Dear Dr. Arbutus Leas:  Thank you for your kind referral of Gwendolyn [name] for consultation of right leg weakness. Although her history is well known to you, please allow [place] to reiterate it for the purpose of our medical record. The patient was accompanied to the clinic by self.    Gwendolyn [name] is a 82 y.o. left-handed female with parkinson's disease, hypertension and hyperlipidemia referred by my colleague, Dr. Arbutus Leas, for evaluation of right leg weakness.   IMPRESSION/PLAN: Proximal leg weakness, worse on the right due to muscle atrophy, most likely neurogenic.  There was no findings of a myopathy on EMG and CK is normal.  MRI of the hip shows severe muscle atrophy of the iliopsoas, gluteus medius, and gluteus minimus along with paraspinal muscle atrophy.  Exam does not suggest a disorder of anterior horn cells.  Lumbar plexopathy is unlikely with normal sensory responses.  I discussed that we do could additional testing with muscle biopsy or CSF testing, but my overall suspicion for myopathy and polyradiculopathy is overall very low.  Unfortunately, I do not have a good diagnosis to explain her proximal leg weakness and atrophy.   It was mutually decided to monitor symptoms and reassess in 6 months.  She will continue balance and water exercises.  If there is any new neurological symptoms or progressive weakness, she will come back to see me sooner.   ------------------------------------------------------------- History of present illness: In 2022, she recalls noticing that she began walking with a limp.  She has low back pain sometimes at the end of the day.  She starting using a cane around the same time.  She had left knee replacement in 2023 and continued to walk with a limp.  She is very active and continues to go to balance class and water  exercise.  She noticed that if she sits too long, she has pressure in the low back.  She has weakness in the right leg and has some difficulty with stairs and has to manually lift her right leg to get it into her car.  She denies weakness in the right leg or arms.  No problems with speech/swallow.  No numbness/tingling of the arms or legs.    She is retired Engineer, civil (consulting) from the surgical center.  She worked full-time until she was 39. She was very independent and kept up with her yard work until 2 years ago when her right leg started to get weak.  Fortunately, she has no falls except a mechanical one when she lost balance while trying to push a stake into the ground.    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 MRI lumbar spine showed left foraminal stenosis at L2-3 and L3-4.  Per my review, there is atrophy of the paraspinal muscles.  EMG shows neurogenic changes involving ectus femoris, adductor longus, iliopsoas, and gluteus medius muscles.  No evidence of myopathy or active denervation.     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  Correlate clinically.  There is no evidence of a large fiber sensorimotor polyneuropathy.    MRI lumbar spine wo contrast 03/17/2023: 1. 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 2. 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 3. Left lateral recess  narrowing at L1-2 and L2-3, which could cause left L2 and L3 radiculopathy.   MRI pelvis wo contrast 02/26/2023: 1. No acute osseous injury of the pelvis. 2. 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 No periarticular fluid collection or osteolysis. 3. Mild bilateral greater trochanteric bursitis. 4. Severe atrophy of the right iliopsoas muscle. 5. Severe atrophy of the right gluteus minimus and medius muscles. 6. Lower lumbar spine spondylosis partially visualized as described above. If there is further clinical concern, recommend an MRI of the lumbar spine.   Lab Results  Component Value Date   HGBA1C 5.6 01/06/2023   Lab Results  Component Value Date   VITAMINB12 671 01/06/2023   Lab Results  Component Value Date   TSH 1.02 01/06/2023   No results found for: "ESRSEDRATE", "POCTSEDRATE"  Past Medical History:  Diagnosis Date   Allergy    Arthritis    Blood transfusion without reported diagnosis    Cataract    removed both eyes    Cervical radicular pain 02/11/2016   Glaucoma    low pressure glaucoma    Hyperlipidemia    Hypertension    Osteopenia    Parkinson's disease    Pelvis fracture (HCC) 03/26/2002   both sides fx   Positive colorectal cancer screening using Cologuard test 04/14/2017   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    Vitamin D deficiency     Past Surgical History:  Procedure Laterality Date   COLONOSCOPY     DILATION AND CURETTAGE OF UTERUS     age 33    right elbow   surgery  March 26, 2002   right hand ulner nerve surgery  March 26, 2002   I and d done right hand/wrist   right shoulder humerus fx  july 2003   surgery done   right total hip arthroplasty  2008   right total knee arthroplasty  sept 2012   right wrist plating  2004   TONSILLECTOMY  age 67   TOTAL HIP ARTHROPLASTY  10/22/2011   Procedure: TOTAL HIP ARTHROPLASTY ANTERIOR APPROACH;   Surgeon: Shelda Pal, MD;  Location: [facility];  Service: Orthopedics;  Laterality: Left;  Left Total Hip Arthroplasty,  Anterior Approach    TOTAL KNEE ARTHROPLASTY Left 04/28/2022   Procedure: TOTAL KNEE ARTHROPLASTY;  Surgeon: Durene Romans, MD;  Location: [facility];  Service: Orthopedics;  Laterality: Left;     Medications:  Outpatient Encounter Medications as of 03/24/2023  Medication Sig   ALPRAZolam (XANAX) 0.5 MG tablet TAKE 1/2 TO 1 (ONE-HALF TO ONE) TABLET BY MOUTH THREE TIMES DAILY AS NEEDED FOR ANXIETY   ascorbic acid (VITAMIN C) 500 MG tablet Take 500 mg by mouth daily.   carbidopa-levodopa (SINEMET IR) 25-100 MG tablet TAKE ONE TABLET BY MOUTH THREE TIMES A DAY (AT 7AM, 11AM, AND 4PM)   CHELATED MAGNESIUM PO Take 250 mg by mouth 2 (two) times daily.   cholecalciferol (VITAMIN D3) 25 MCG (1000 UNIT) tablet Take 1,000 Units by  mouth in the morning and at bedtime.   cyanocobalamin (VITAMIN B12) 1000 MCG tablet Take 1,000 mcg by mouth daily.   fexofenadine (ALLEGRA) 180 MG tablet Take 180 mg by mouth daily as needed for allergies or rhinitis.   furosemide (LASIX) 40 MG tablet Take 1 tablet (40 mg total) by mouth as needed for edema.   losartan-hydrochlorothiazide (HYZAAR) 50-12.5 MG tablet TAKE ONE TABLET BY MOUTH DAILY FOR BLOOD PRESSURE   Omega-3 Fatty Acids (FISH OIL) 1000 MG CAPS Take 1,000 mg by mouth 2 (two) times daily.   pregabalin (LYRICA) 150 MG capsule Take  1 capsule  at Bedtime  for Sleep & Chronic Pain                                   /               TAKE                                   BY                              MOUTH   rosuvastatin (CRESTOR) 20 MG tablet Take 1 tablet MWF for Cholesterol   timolol (TIMOPTIC) 0.5 % ophthalmic solution Place 1 drop into both eyes every morning.    zinc gluconate 50 MG tablet Take 50 mg by mouth daily.   No facility-administered encounter medications on file as of 03/24/2023.    Allergies:  Allergies  Allergen Reactions    Oxycodone-Acetaminophen Other (See Comments)    panic attack   Vit C-Cholecalciferol-Rose Hip [Cholecalciferol-Vitamin C] Other (See Comments)    dyspepsia   Ibuprofen Hives   Macrodantin [Nitrofurantoin Macrocrystal] Rash    Family History: Family History  Problem Relation Age of Onset   Hypertension Mother    Diabetes Mother    Heart disease Mother        enlarged heart   Atrial fibrillation Mother    Congestive Heart Failure Mother    Diabetes Father    Heart disease Father    Heart attack Father 53   Tremor Father    Atrial fibrillation Brother    Atrial fibrillation Brother    Breast cancer Maternal Grandmother        early 77's   Tremor Paternal Grandmother    Breast cancer Maternal Aunt        early 19's   Breast cancer Paternal Aunt        62's   Colon cancer Neg Hx    Esophageal cancer Neg Hx    Rectal cancer Neg Hx    Stomach cancer Neg Hx    Pancreatic cancer Neg Hx     Social History: Social History   Tobacco Use   Smoking status: Never   Smokeless tobacco: Never  Vaping Use   Vaping status: Never Used  Substance Use Topics   Alcohol use: Yes    Comment: social- rare   Drug use: No   Social History   Social History Narrative   Are you right handed or left handed? Left Handed    Are you currently employed ? No   What is your current occupation? Retired   Do you live at home alone? No    Who lives with you?  Lives with a friend   What type of home do you live in: 1 story or 2 story? Lives in a one story home        Vital Signs:  BP (!) 148/76   Pulse 66   Ht 5\' 5"  (1.651 m)   Wt 172 lb (78 kg)   SpO2 94%   BMI 28.62 kg/m     Neurological Exam: MENTAL STATUS including orientation to time, place, person, recent and remote memory, attention span and concentration, language, and fund of knowledge is normal.  Speech is not dysarthric.  CRANIAL NERVES: II:  No visual field defects.     III-IV-VI: Pupils equal round and reactive to light.   Normal conjugate, extra-ocular eye movements in all directions of gaze.  No nystagmus.  No ptosis.   V:  Normal facial sensation.    VII:  Normal facial symmetry and movements.   VIII:  Normal hearing and vestibular function.   IX-X:  Normal palatal movement.   XI:  Normal shoulder shrug and head rotation.   XII:  Normal tongue strength and range of motion, no deviation or fasciculation.  MOTOR:  Mild right quadriceps atrophy.  No fasciculations or abnormal movements.  No pronator drift.   Upper Extremity:  Right  Left  Deltoid  5/5   5/5   Biceps  5/5   5/5   Triceps  5/5   5/5   Wrist extensors  5/5   5/5   Wrist flexors  5/5   5/5   Finger extensors  5/5   5/5   Finger flexors  5/5   5/5   Dorsal interossei  5/5   5/5   Abductor pollicis  5/5   5/5   Tone (Ashworth scale)  0  0   Lower Extremity:  Right  Left  Hip flexors  4/5   5-/5   Hip extensors  4/5   5/5   Adductor 4/5  5/5  Abductor 4/5  5/5  Knee flexors  5-/5   5/5   Knee extensors  5/5   5/5   Dorsiflexors  5/5   5/5   Plantarflexors  5/5   5/5   Toe extensors  5/5   5/5   Toe flexors  5/5   5/5   Tone (Ashworth scale)  0  0   MSRs:                                           Right        Left brachioradialis 2+  2+  biceps 2+  2+  triceps 2+  2+  patellar 0  0  ankle jerk 0  0  Hoffman no  no  plantar response down  down   SENSORY:  Normal and symmetric perception of light touch, temperature and vibration.   COORDINATION/GAIT: Normal finger-to- nose-finger.  Reduced amplitude of finger tapping on the left.   Trendelenburg gait on the right, assisted with cane, stable.   Total time spent reviewing records, interview, history/exam, documentation, and coordination of care on day of encounter:  45 min    Thank you for allowing me to participate in patient's care.  If I can answer any additional questions, I would be pleased to do so.    Sincerely,    Leanard Dimaio K. Allena Katz, DO

## 2023-03-25 ENCOUNTER — Ambulatory Visit: Payer: Medicare HMO | Admitting: Neurology

## 2023-04-01 ENCOUNTER — Other Ambulatory Visit: Payer: Self-pay | Admitting: Neurology

## 2023-04-01 DIAGNOSIS — G20A1 Parkinson's disease without dyskinesia, without mention of fluctuations: Secondary | ICD-10-CM

## 2023-04-02 ENCOUNTER — Emergency Department (HOSPITAL_BASED_OUTPATIENT_CLINIC_OR_DEPARTMENT_OTHER): Payer: Medicare HMO

## 2023-04-02 ENCOUNTER — Other Ambulatory Visit: Payer: Self-pay

## 2023-04-02 ENCOUNTER — Emergency Department (HOSPITAL_BASED_OUTPATIENT_CLINIC_OR_DEPARTMENT_OTHER)
Admission: EM | Admit: 2023-04-02 | Discharge: 2023-04-02 | Disposition: A | Discharge status: Home / Self Care | Payer: Medicare HMO | Source: Home / Self Care | Attending: Emergency Medicine | Admitting: Emergency Medicine

## 2023-04-02 ENCOUNTER — Telehealth: Payer: Self-pay

## 2023-04-02 ENCOUNTER — Encounter (HOSPITAL_BASED_OUTPATIENT_CLINIC_OR_DEPARTMENT_OTHER): Payer: Self-pay

## 2023-04-02 DIAGNOSIS — G20C Parkinsonism, unspecified: Secondary | ICD-10-CM | POA: Insufficient documentation

## 2023-04-02 DIAGNOSIS — L03116 Cellulitis of left lower limb: Secondary | ICD-10-CM | POA: Diagnosis not present

## 2023-04-02 DIAGNOSIS — Z79899 Other long term (current) drug therapy: Secondary | ICD-10-CM | POA: Insufficient documentation

## 2023-04-02 DIAGNOSIS — L539 Erythematous condition, unspecified: Secondary | ICD-10-CM | POA: Diagnosis present

## 2023-04-02 DIAGNOSIS — I10 Essential (primary) hypertension: Secondary | ICD-10-CM | POA: Insufficient documentation

## 2023-04-02 DIAGNOSIS — M79605 Pain in left leg: Secondary | ICD-10-CM | POA: Diagnosis not present

## 2023-04-02 MED ORDER — DOXYCYCLINE HYCLATE 100 MG PO CAPS: 100.0000 mg | ORAL_CAPSULE | Freq: Two times a day (BID) | ORAL | 0 refills | Status: AC

## 2023-04-02 NOTE — Discharge Instructions (Signed)
Take the antibiotic doxycycline for presumed cellulitis to the left lower extremity.  No evidence of any blood clots to the leg.  Follow-up with your primary care doctor.  Return for any new or worse symptoms.

## 2023-04-02 NOTE — ED Triage Notes (Signed)
Pt presents with some localized redness and swelling to LLE. Pt has punctate scab in the center of area. Pt was told by PCP to have a DVT r/o.

## 2023-04-02 NOTE — Telephone Encounter (Signed)
The patient called and reported new onset of calf tightness, redness, discomfort and a concerning dark spot. She reported that she was concerned for a blood clot, insect bite, or cellulitis. She was advised to report to her local emergency department for evaluation and treatment of her symptoms. The patient voiced clear understanding to this staff member.

## 2023-04-02 NOTE — ED Provider Notes (Addendum)
[place] [facility] AT [facility] Provider Note   CSN: [id number] Arrival date & time: 04/02/23  4259     History  Chief Complaint  Patient presents with   Leg Pain    Gwendolyn [name] is a 82 y.o. female.  Patient sent in by her primary care DVT the ultrasound rule out of her left lower extremity.  Patient has a scab on the lateral aspect of the left leg that measures about a centimeter.  Has an area of redness around it no purulent discharge.  Patient was working in her yard the other day and was trimming IV.  Thinks she may have gotten bitten by something or scratched.  Patient also did pick up a little bit of poison ivy but there is no vesicles in this area.  Patient denies any chest pain or shortness of breath.  May be some mild swelling to the left ankle.  Patient states feeling to her left foot is normal.  And strength of the left foot is normal.  Past medical history significant for hypertension ulnar nerve damage right arm.  Hyperlipidemia history of Parkinson's disease.  Patient is never used tobacco products.  Patient without fever here Temp 98.8 respiratory rate 17 heart rate 69 blood pressure 163/72 oxygen saturations 100%.  Patient does have an allergy to Macrodantin.       Home Medications Prior to Admission medications   Medication Sig Start Date End Date Taking? Authorizing Provider  ALPRAZolam (XANAX) 0.5 MG tablet TAKE 1/2 TO 1 (ONE-HALF TO ONE) TABLET BY MOUTH THREE TIMES DAILY AS NEEDED FOR ANXIETY 12/21/17   Doree Albee, PA-C  ascorbic acid (VITAMIN C) 500 MG tablet Take 500 mg by mouth daily.    [provider]  carbidopa-levodopa (SINEMET IR) 25-100 MG tablet TAKE 1 TABLET BY MOUTH 3 TIMES A DAY **7AM, 11AM, AND AT 4PM** 04/01/23   Tat, Rebecca S, DO  CHELATED MAGNESIUM PO Take 250 mg by mouth 2 (two) times daily.    [provider]  cholecalciferol (VITAMIN D3) 25 MCG (1000 UNIT) tablet Take 1,000 Units by mouth in  the morning and at bedtime.    [provider]  cyanocobalamin (VITAMIN B12) 1000 MCG tablet Take 1,000 mcg by mouth daily.    [provider]  fexofenadine (ALLEGRA) 180 MG tablet Take 180 mg by mouth daily as needed for allergies or rhinitis.    [provider]  furosemide (LASIX) 40 MG tablet Take 1 tablet (40 mg total) by mouth as needed for edema. 07/11/19   Doree Albee, PA-C  losartan-hydrochlorothiazide (HYZAAR) 50-12.5 MG tablet TAKE ONE TABLET BY MOUTH DAILY FOR BLOOD PRESSURE 02/02/22   Raynelle Dick, NP  Omega-3 Fatty Acids (FISH OIL) 1000 MG CAPS Take 1,000 mg by mouth 2 (two) times daily.    [provider]  pregabalin (LYRICA) 150 MG capsule Take  1 capsule  at Bedtime  for Sleep & Chronic Pain                                   /               TAKE                                   BY  MOUTH 05/24/22   Lucky Cowboy, MD  rosuvastatin (CRESTOR) 20 MG tablet Take 1 tablet MWF for Cholesterol 10/05/22   Raynelle Dick, NP  timolol (TIMOPTIC) 0.5 % ophthalmic solution Place 1 drop into both eyes every morning.  04/30/16   [provider]  zinc gluconate 50 MG tablet Take 50 mg by mouth daily.    [provider]      Allergies    Oxycodone-acetaminophen, Ibuprofen, and Macrodantin [nitrofurantoin macrocrystal]    Review of Systems   Review of Systems  Constitutional:  Negative for chills and fever.  HENT:  Negative for ear pain and sore throat.   Eyes:  Negative for pain and visual disturbance.  Respiratory:  Negative for cough and shortness of breath.   Cardiovascular:  Positive for leg swelling. Negative for chest pain and palpitations.  Gastrointestinal:  Negative for abdominal pain and vomiting.  Genitourinary:  Negative for dysuria and hematuria.  Musculoskeletal:  Negative for arthralgias and back pain.  Skin:  Positive for wound. Negative for color change and rash.  Neurological:   Negative for seizures and syncope.  All other systems reviewed and are negative.   Physical Exam Updated Vital Signs BP (!) 163/72 (BP Location: Right Arm)   Pulse 69   Temp 98 F (36.7 C)   Resp 17   Ht 1.651 m (5\' 5" )   Wt 76.7 kg   SpO2 100%   BMI 28.12 kg/m  Physical Exam Vitals and nursing note reviewed.  Constitutional:      General: She is not in acute distress.    Appearance: Normal appearance. She is well-developed.  HENT:     Head: Normocephalic and atraumatic.  Eyes:     Extraocular Movements: Extraocular movements intact.     Conjunctiva/sclera: Conjunctivae normal.     Pupils: Pupils are equal, round, and reactive to light.  Cardiovascular:     Rate and Rhythm: Normal rate and regular rhythm.     Heart sounds: No murmur heard. Pulmonary:     Effort: Pulmonary effort is normal. No respiratory distress.     Breath sounds: Normal breath sounds.  Abdominal:     Palpations: Abdomen is soft.     Tenderness: There is no abdominal tenderness.  Musculoskeletal:        General: Swelling, tenderness and signs of injury present.     Cervical back: Neck supple.     Left lower leg: Edema present.     Comments: Left leg lateral calf area with a 1 cm scab with surrounding erythema no purulent discharge.  The erythema measures approximately 5 cm.  Some slight swelling to the left ankle area.  Right lower extremity normal.  Dorsalis pedis pulse is 2+ to the left foot and sensation is intact and no weakness.  Skin:    General: Skin is warm and dry.     Capillary Refill: Capillary refill takes less than 2 seconds.     Findings: Rash present.     Comments: Patient does have a little bit of scattered rash here and they are consistent with a contact dermatitis mostly upper extremities  Neurological:     General: No focal deficit present.     Mental Status: She is alert and oriented to person, place, and time.  Psychiatric:        Mood and Affect: Mood normal.     ED  Results / Procedures / Treatments   Labs (all labs ordered are listed, but only abnormal results  are displayed) Labs Reviewed - No data to display  EKG None  Radiology No results found.  Procedures Procedures    Medications Ordered in ED Medications - No data to display  ED Course/ Medical Decision Making/ A&P                                 Medical Decision Making  Clinically it appears to be a mild cellulitis secondary to scratch that occurred to the left lateral calf.  But primary care did provider sent patient in for ultrasound.  So we will get that.  If that does not show any evidence of DVT will go ahead and treat with doxycycline for cellulitis.  Patient nontoxic no acute distress.  And will treat patient with doxycycline.  Ultrasound left lower extremity without evidence of DVT.  Will treat as cellulitis.   Final Clinical Impression(s) / ED Diagnoses Final diagnoses:  Cellulitis of left lower extremity    Rx / DC Orders ED Discharge Orders     None         Vanetta Mulders, MD 04/02/23 1059    Vanetta Mulders, MD 04/02/23 [id number]

## 2023-04-05 ENCOUNTER — Encounter: Payer: Self-pay | Admitting: Nurse Practitioner

## 2023-04-05 ENCOUNTER — Ambulatory Visit (INDEPENDENT_AMBULATORY_CARE_PROVIDER_SITE_OTHER): Payer: Medicare HMO | Admitting: Nurse Practitioner

## 2023-04-05 ENCOUNTER — Other Ambulatory Visit: Payer: Self-pay

## 2023-04-05 VITALS — BP 148/80 | HR 72 | Temp 97.8°F | Ht 65.0 in | Wt 171.0 lb

## 2023-04-05 DIAGNOSIS — L237 Allergic contact dermatitis due to plants, except food: Secondary | ICD-10-CM | POA: Diagnosis not present

## 2023-04-05 DIAGNOSIS — I10 Essential (primary) hypertension: Secondary | ICD-10-CM | POA: Diagnosis not present

## 2023-04-05 MED ORDER — PREDNISONE 10 MG PO TABS
ORAL_TABLET | ORAL | 0 refills | Status: DC
Start: 2023-04-05 — End: 2023-04-14

## 2023-04-05 MED ORDER — LOSARTAN POTASSIUM-HCTZ 50-12.5 MG PO TABS
ORAL_TABLET | ORAL | 3 refills | Status: DC
Start: 2023-04-05 — End: 2024-05-16

## 2023-04-05 NOTE — Patient Instructions (Signed)
Poison Ivy Dermatitis Poison ivy dermatitis is irritation and swelling (inflammation) of the skin caused by chemicals in the leaves of the poison ivy plant. The skin reaction often involves redness, blisters, and extreme itching. What are the causes? This condition is caused by a chemical (urushiol) found in the sap of the poison ivy plant. This chemical is sticky and can easily spread to people, animals, and objects. You can get poison ivy dermatitis by: Having direct contact with a poison ivy plant. Touching animals, other people, or objects that have come in contact with poison ivy and have the chemical on them. What increases the risk? This condition is more likely to develop in people who: Are outdoors often in wooded or marshy areas. Go outdoors without wearing protective clothing, such as closed shoes, long pants, and a long-sleeved shirt. What are the signs or symptoms? Symptoms of this condition include: Redness of the skin. Extreme itching. A rash that often includes bumps and blisters. The rash usually appears 48 hours after exposure, if you have been exposed before. If this is the first time you have been exposed, the rash may not appear until a week after exposure. Swelling. This may occur if the reaction is more severe. Symptoms usually last for 1-2 weeks. However, the first time you develop this condition, symptoms may last 3-4 weeks. How is this diagnosed? This condition may be diagnosed based on your symptoms and a physical exam. Your health care provider may also ask you about any recent outdoor activity. How is this treated? Treatment for this condition will vary depending on how severe it is. Treatment may include: Hydrocortisone cream or calamine lotion to relieve itching. Oatmeal baths to soothe the skin. Medicines, such as over-the-counter antihistamine tablets. Oral or injected steroid medicine, for more severe reactions. Follow these instructions at  home: Medicines Take or apply over-the-counter and prescription medicines only as told by your health care provider. Use hydrocortisone cream or calamine lotion as needed to soothe the skin and relieve itching. General instructions Do not scratch or rub your skin. Apply a cold, wet cloth (cold compress) to the affected areas or take baths in cool water. This will help with itching. Avoid hot baths and showers. Take oatmeal baths as needed. Use colloidal oatmeal. You can get this at your local pharmacy or grocery store. Follow the instructions on the packaging. Wash all clothes, bedsheets, towels, and blankets you were in contact with between your exposure and appearance of the rash. Check the affected area every day for signs of infection. Check for: More redness, swelling, or pain. Fluid or blood. Warmth. Pus or a bad smell. Keep all follow-up visits. Your health care provider may want to see how your skin is progressing with treatment. How is this prevented?  Learn to identify the poison ivy plant and avoid contact with the plant. This plant can be recognized by the number of leaves. Generally, poison ivy has three leaves with flowering branches on a single stem. The leaves are typically glossy, and they have jagged edges that come to a point. If you have been exposed to poison ivy, thoroughly wash with soap and water right away. You have about 30 minutes to remove the plant resin before it will cause the rash. Be sure to wash under your fingernails, because any plant resin there will continue to spread the rash. When hiking or camping, wear clothes that will help you to avoid skin exposure. This includes long pants, a long-sleeved shirt, long socks,   and hiking boots. You can also apply preventive lotion to your skin to help limit exposure. If you suspect that your clothes or outdoor gear came in contact with poison ivy, rinse them off outside with a garden hose before you bring them inside  your house. When doing yard work or gardening, wear gloves, long sleeves, long pants, and boots. Wash your garden tools and gloves if they come in contact with poison ivy. If you suspect that your pet has come into contact with poison ivy, wash them with pet shampoo and water. Make sure to wear gloves while washing your pet. Contact a health care provider if: You have open sores in the rash area. You have any signs of infection. You have redness that spreads beyond the rash area. You have a fever. You have a rash over a large area of your body. You have a rash on your eyes, mouth, or genitals. You have a rash that does not improve after a few weeks. Get help right away if: Your face swells or your eyes swell shut. You have trouble breathing. You have trouble swallowing. These symptoms may be an emergency. Get help right away. Call 911. Do not wait to see if the symptoms will go away. Do not drive yourself to the hospital. This information is not intended to replace advice given to you by your health care provider. Make sure you discuss any questions you have with your health care provider. Document Revised: 01/15/2022 Document Reviewed: 01/15/2022 Elsevier Patient Education  2024 Elsevier Inc.  

## 2023-04-05 NOTE — Progress Notes (Signed)
Assessment and Plan:  Gwendolyn [name] was seen today for an episodic visit.  Diagnoses and all order for this visit:  1. Poison ivy Take daily antihistamine such as Zyrtec - sample provided Continue to monitor for spreading of rash with signs of infection - contact office if noticed. Currently on prophylaxis with Doxycycline for tmt cellulitis diagnosed 04/02/23.  - predniSONE (DELTASONE) 10 MG tablet; 1 tab 3 x day for 2 days, then 1 tab 2 x day for 2 days, then 1 tab 1 x day for 3 days  Dispense: 13 tablet; Refill: 0  2. Essential hypertension Discussed DASH (Dietary Approaches to Stop Hypertension) DASH diet is lower in sodium than a typical American diet. Cut back on foods that are high in saturated fat, cholesterol, and trans fats. Eat more whole-grain foods, fish, poultry, and nuts Remain active and exercise as tolerated daily.  Monitor BP at home-Call if greater than 130/80.   Notify office for further evaluation and treatment, questions or concerns if s/s fail to improve. The risks and benefits of my recommendations, as well as other treatment options were discussed with the patient today. Questions were answered.  Further disposition pending results of labs. Discussed med's effects and SE's.    Over 15 minutes of exam, counseling, chart review, and critical decision making was performed.   Future Appointments  Date Time Provider Department Center  04/14/2023  9:30 AM Adela Glimpse, NP [facility] None  07/06/2023  2:30 PM Tat, Octaviano Batty, DO [facility] None  09/27/2023  9:30 AM Nita Sickle K, DO [facility] None  01/19/2024 10:00 AM Lucky Cowboy, MD [facility] None    ------------------------------------------------------------------------------------------------------------------   HPI BP (!) 148/80   Pulse 72   Temp 97.8 F (36.6 C)   Ht 5\' 5"  (1.651 m)   Wt 171 lb (77.6 kg)   SpO2 98%   BMI 28.46 kg/m   Patient presents for evaluation of rash on  right side  of face and cheek, left index finger .  She is concerned for area spreading into the lower part of the right eye. Onset of symptoms was gradual starting 4 days ago, and has been unchanged since that time.. Symptoms include itching, weeping, and blisters. Care prior to arrival consisted of OTC ointment and old prednisone on hand and oatmeal, with moderate relief.  Denies fever, chills, N/V.  She does report being seen in UC 04/02/23 for LLE cellulitis from a thorn that was stuck in the side of her calf.  Currently improving with Doxycycline.   BP is elevated in clinic today.  Currently controls with lifestyle, Hyzaar and Lasix.  States in home readings are averaging around 130/80. Denies CP, heart palpitations, SOB. 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   carbidopa-levodopa (SINEMET IR) 25-100 MG tablet TAKE 1 TABLET BY MOUTH 3 TIMES A DAY **7AM, 11AM, AND AT 4PM**   CHELATED MAGNESIUM PO Take 250 mg by mouth 2 (two) times  daily.   cholecalciferol (VITAMIN D3) 25 MCG (1000 UNIT) tablet Take 1,000 Units by mouth in the morning and at bedtime.   cyanocobalamin (VITAMIN B12) 1000 MCG tablet Take 1,000 mcg by mouth daily.   doxycycline (VIBRAMYCIN) 100 MG capsule Take 1 capsule (100 mg total) by mouth 2 (two) times daily for 7 days.   fexofenadine (ALLEGRA) 180 MG tablet Take 180 mg by mouth daily as needed for allergies or rhinitis.   furosemide (LASIX) 40 MG tablet Take 1 tablet (40 mg total) by mouth as needed for edema.   Omega-3 Fatty Acids (FISH OIL) 1000 MG CAPS Take 1,000 mg by mouth 2 (two) times daily.   pregabalin (LYRICA) 150 MG capsule Take  1 capsule  at Bedtime  for Sleep & Chronic Pain                                   /               TAKE                                   BY                              MOUTH   rosuvastatin (CRESTOR) 20 MG tablet Take 1 tablet MWF for Cholesterol   timolol (TIMOPTIC) 0.5 % ophthalmic solution Place 1 drop into both eyes every morning.    zinc gluconate 50 MG tablet Take 50 mg by mouth daily.   No current facility-administered medications on file prior to visit.    ROS: all negative except what is noted in the HPI.   Physical Exam:  BP (!) 148/80   Pulse 72   Temp 97.8 F (36.6 C)   Ht 5\' 5"  (1.651 m)   Wt 171 lb (77.6 kg)   SpO2 98%   BMI 28.46 kg/m   General Appearance: NAD.  Awake, conversant and cooperative. Eyes: PERRLA, EOMs intact.  Sclera white.  Conjunctiva without erythema. Sinuses: No frontal/maxillary tenderness.  No nasal discharge. Nares patent.  ENT/Mouth: Ext aud canals clear.  Bilateral TMs w/DOL and without erythema or bulging. Hearing intact.  Posterior pharynx without swelling or exudate.  Tonsils without swelling or erythema.  Neck: Supple.  No masses, nodules or thyromegaly. Respiratory: Effort is regular with non-labored breathing. Breath sounds are equal bilaterally without rales, rhonchi, wheezing or stridor.  Cardio: RRR with no  MRGs. Brisk peripheral pulses without edema.  Abdomen: Active BS in all four quadrants.  Soft and non-tender without guarding, rebound tenderness, hernias or masses. Lymphatics: Non tender without lymphadenopathy.  Musculoskeletal: Full ROM, 5/5 strength, normal ambulation.  No clubbing or cyanosis. Skin: Scattered vesicle  type rash with underlying erythema along right maxilla area towards nose, left index finger. Warm.  Appropriate color for ethnicity. Appropriate color for ethnicity. Warm without rashes, lesions, ecchymosis, ulcers.  Neuro: CN II-XII grossly normal. Normal muscle tone without cerebellar symptoms and intact sensation.   Psych: AO X 3,  appropriate mood and affect, insight and judgment.     Adela Glimpse, NP 2:27 PM [facility]

## 2023-04-09 ENCOUNTER — Telehealth: Payer: Self-pay

## 2023-04-09 NOTE — Telephone Encounter (Signed)
Transition Care Management Follow-up Telephone Call Date of discharge and from where: [facility] 8/2 How have you been since you were released from the hospital? Doing ok  Any questions or concerns? No  Items Reviewed: Did the pt receive and understand the discharge instructions provided? Yes  Medications obtained and verified? No  Other? No  Any new allergies since your discharge? No  Dietary orders reviewed? No Do you have support at home? Yes     Follow up appointments reviewed:  PCP Hospital f/u appt confirmed? Yes  Scheduled to see PCP  on 8/5 @ . Specialist Hospital f/u appt confirmed? No  Scheduled to see  on  @ . Are transportation arrangements needed? No  If their condition worsens, is the pt aware to call PCP or go to the Emergency Dept.? Yes Was the patient provided with contact information for the PCP's office or ED? Yes Was to pt encouraged to call back with questions or concerns? Yes

## 2023-04-09 NOTE — Telephone Encounter (Signed)
Transition Care Management Unsuccessful Follow-up Telephone Call  Date of discharge and from where:  [facility] 8/2  Attempts:  1st Attempt  Reason for unsuccessful TCM follow-up call:  No answer/busy   Lenard Forth [facility] Guide, [facility] [phone] [address] [place], [place], [place] [address] Phone: [phone] Email: [email or web address]

## 2023-04-13 ENCOUNTER — Ambulatory Visit: Payer: Medicare HMO | Admitting: Neurology

## 2023-04-13 NOTE — Progress Notes (Unsigned)
MEDICARE ANNUAL WELLNESS VISIT AND FOLLOW UP  Assessment:   [name] was seen today for follow-up and medicare wellness.  Diagnoses and all orders for this visit:  Encounter for Medicare annual wellness exam Due annually  Health maintenance reviewed Healthily lifestyle goals set  Essential hypertension Discussed DASH (Dietary Approaches to Stop Hypertension) DASH diet is lower in sodium than a typical American diet. Cut back on foods that are high in saturated fat, cholesterol, and trans fats. Eat more whole-grain foods, fish, poultry, and nuts Remain active and exercise as tolerated daily.  Monitor BP at home-Call if greater than 130/80.  Check CMP/CBC  Parkinson's Disease (HCC) Dr. Arbutus Leas is managing,  Continue Sinemet IR Encourage regular exercise/balance program  Patient denies needs at this time  Hyperlipidemia, mixed Discussed lifestyle modifications. Recommended diet heavy in fruits and veggies, omega 3's. Decrease consumption of animal meats, cheeses, and dairy products. Remain active and exercise as tolerated. Continue to monitor. Check lipids/TSH  Abnormal glucose Education: Reviewed 'ABCs' of diabetes management  Discussed goals to be met and/or maintained include A1C (<7) Blood pressure (<130/80) Cholesterol (LDL <70) Continue Eye Exam yearly  Continue Dental Exam Q6 mo Discussed dietary recommendations Discussed Physical Activity recommendations Check A1C  Overweight (BMI 25.0-29.9) Discussed appropriate BMI Diet modification. Physical activity. Encouraged/praised to build confidence.  Generalized anxiety disorder Doing well on current regimen, rare xanax use, monitor PDMP with refill requests Discussed stress management techniques   Discussed good sleep hygiene Discussed increasing physical activity and exercise Increase water intake  Open-angle glaucoma, unspecified glaucoma stage, unspecified laterality, unspecified open-angle glaucoma  type Continue drops as prescribed Yearly eye exams  Cervical arthritis/radicular pain Continue Lyrica Follow with Orthopedics PRN  Vitamin D deficiency Continue supplement for goal of 60-100 Monitor Vitamin D levels  Medication management All medications discussed and reviewed in full. All questions and concerns regarding medications addressed.    Orders Placed This Encounter  Procedures   CBC with Differential/Platelet   COMPLETE METABOLIC PANEL WITH GFR   Lipid panel   Hemoglobin A1c    Notify office for further evaluation and treatment, questions or concerns if any reported s/s fail to improve.   The patient was advised to call back or seek an in-person evaluation if any symptoms worsen or if the condition fails to improve as anticipated.   Further disposition pending results of labs. Discussed med's effects and SE's.    I discussed the assessment and treatment plan with the patient. The patient was provided an opportunity to ask questions and all were answered. The patient agreed with the plan and demonstrated an understanding of the instructions.  Discussed med's effects and SE's. Screening labs and tests as requested with regular follow-up as recommended.  I provided 35 minutes of face-to-face time during this encounter including counseling, chart review, and critical decision making was preformed.  Today's Plan of Care is based on a patient-centered health care approach known as shared decision making - the decisions, tests and treatments allow for patient preferences and values to be balanced with clinical evidence.    Future Appointments  Date Time Provider Department Center  07/06/2023  2:30 PM Tat, Octaviano Batty, DO [facility] None  09/27/2023  9:30 AM Nita Sickle K, DO [facility] None  01/19/2024 10:00 AM Lucky Cowboy, MD [facility] None  04/13/2024 10:00 AM Adela Glimpse, NP [facility] None    Plan:   During the course of the visit the patient was educated and  counseled about appropriate screening and preventive services  including:   Pneumococcal vaccine  Influenza vaccine Td vaccine Prevnar 13 Screening electrocardiogram Screening mammography Bone densitometry screening Colorectal cancer screening Diabetes screening Glaucoma screening Nutrition counseling  Advanced directives: given info/requested copies   Subjective:   Gwendolyn [name] is a 82 y.o. female who presents for Medicare Annual Wellness Visit and 3 month follow up on hypertension, prediabetes, hyperlipidemia, vitamin D def.  Overall she reports feeling well today.    She has hx of anxiety, has been prescribed xanax though improved.   She reports last use was last week, will go months without needing.   Cervical arthritis/radicular pain, managing fairly with lifestyle modification (sleeping on back, flatter pillow), using aleve PRN, lyrica.    She reports having numb toes, burning in left calf/leg pain, having some difficulty walking due to this, has discussed with est ortho Dr. Charlann Boxer, did PT without improvement.  Recently had updated lumbar and pelvic MRI that shows arthritic changes.  She id sue for a DEXA Scan.  Continues to use support of a cane.  Attending water aerobics once weekly and balance classes biweekly.   She was evaluated by Dr. Arbutus Leas due to change in gait, pill rolling, was formally dx with Parkinson's started on sinemet IR TID, patient hasn't noted particular changes with this but reports minimal sx to begin with, managing well.   BMI is Body mass index is 28.56 kg/m., she has been working on diet and exercise, generally active, admits to emotional eating, eating too many cookies.  Wt Readings from Last 3 Encounters:  04/14/23 171 lb 9.6 oz (77.8 kg)  04/05/23 171 lb (77.6 kg)  04/02/23 169 lb (76.7 kg)   Her blood pressure has been controlled at home, today their BP is BP: 136/66  She does not workout. She denies chest pain, shortness of breath,  dizziness. She takes lasix AS needed for swelling.    She is on cholesterol medication and has myalgias with crestor (20 mg three times a week) on the days she takes it. Her cholesterol is at goal. The cholesterol last visit was:   Lab Results  Component Value Date   CHOL 160 01/06/2023   HDL 67 01/06/2023   LDLCALC 76 01/06/2023   TRIG 83 01/06/2023   CHOLHDL 2.4 01/06/2023    She has been working on diet and exercise for glucose management, and denies paresthesia of the feet, polydipsia, polyuria and visual disturbances. Last A1C in the office was:  Lab Results  Component Value Date   HGBA1C 5.6 01/06/2023   Lab Results  Component Value Date   EGFR 64 01/06/2023   Patient is on Vitamin D supplement.   Lab Results  Component Value Date   VD25OH 81 01/06/2023       Medication Review Current Outpatient Medications on File Prior to Visit  Medication Sig   ALPRAZolam (XANAX) 0.5 MG tablet TAKE 1/2 TO 1 (ONE-HALF TO ONE) TABLET BY MOUTH THREE TIMES DAILY AS NEEDED FOR ANXIETY   ascorbic acid (VITAMIN C) 500 MG tablet Take 500 mg by mouth daily.   carbidopa-levodopa (SINEMET IR) 25-100 MG tablet TAKE 1 TABLET BY MOUTH 3 TIMES A DAY **7AM, 11AM, AND AT 4PM**   CHELATED MAGNESIUM PO Take 250 mg by mouth 2 (two) times daily.   cholecalciferol (VITAMIN D3) 25 MCG (1000 UNIT) tablet Take 1,000 Units by mouth in the morning and at bedtime.   CRANBERRY PO Take by mouth daily.   cyanocobalamin (VITAMIN B12) 1000 MCG tablet Take 1,000  mcg by mouth daily.   fexofenadine (ALLEGRA) 180 MG tablet Take 180 mg by mouth daily as needed for allergies or rhinitis.   furosemide (LASIX) 40 MG tablet Take 1 tablet (40 mg total) by mouth as needed for edema.   losartan-hydrochlorothiazide (HYZAAR) 50-12.5 MG tablet TAKE ONE TABLET BY MOUTH DAILY FOR BLOOD PRESSURE   Omega-3 Fatty Acids (FISH OIL) 1000 MG CAPS Take 1,000 mg by mouth 2 (two) times daily.   pregabalin (LYRICA) 150 MG capsule Take  1  capsule  at Bedtime  for Sleep & Chronic Pain                                   /               TAKE                                   BY                              MOUTH   rosuvastatin (CRESTOR) 20 MG tablet Take 1 tablet MWF for Cholesterol   timolol (TIMOPTIC) 0.5 % ophthalmic solution Place 1 drop into both eyes every morning.    zinc gluconate 50 MG tablet Take 50 mg by mouth daily.   predniSONE (DELTASONE) 10 MG tablet 1 tab 3 x day for 2 days, then 1 tab 2 x day for 2 days, then 1 tab 1 x day for 3 days   No current facility-administered medications on file prior to visit.    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   Parkinson's disease 11/28/2021   B12 deficiency 05/28/2021   Cervical arthritis 09/27/2017   Open-angle glaucoma 05/14/2016   Generalized anxiety disorder 02/11/2016   Overweight (BMI 25.0-29.9) 04/25/2015   Medication management 02/12/2014   Essential hypertension 08/08/2013   Hyperlipidemia, mixed 08/08/2013   Abnormal glucose 08/08/2013   Vitamin D deficiency 08/08/2013    Screening Tests Immunization History  Administered Date(s) Administered   Fluad Quad(high Dose 65+) 05/23/2021   Influenza, High Dose Seasonal PF 06/06/2014, 05/14/2016, 06/24/2018, 06/05/2019, 06/13/2020, 06/29/2022   Influenza-Unspecified 06/13/2013, 05/02/2015, 06/24/2018   Moderna Sars-Covid-2 Vaccination 09/13/2019, 10/11/2019, 07/01/2020, 01/17/2021   Pneumococcal Conjugate-13 05/24/2014   Pneumococcal Polysaccharide-23 01/08/2009   Td 02/12/2022   Tdap 01/28/2012   Zoster Recombinant(Shingrix) 02/24/2022, 06/29/2022   Zoster, Live 08/31/2006   Health Maintenance  Topic Date Due   COVID-19 Vaccine (5 - 2023-24 season) 05/01/2022   INFLUENZA VACCINE  04/01/2023   MAMMOGRAM  01/04/2024   Medicare Annual Wellness (AWV)  04/13/2024   DTaP/Tdap/Td (3 - Td or Tdap) 02/13/2032   Pneumonia  Vaccine 77+ Years old  Completed   DEXA SCAN  Completed   Zoster Vaccines- Shingrix  Completed   HPV VACCINES  Aged Out   Last colonoscopy: 2018, benign polyps, follow up PRN only per Dr. Adela Lank Cologuard 2018 (positive)  Last mammogram: 01/2022 Last pap smear/pelvic exam: 2019 Dr. Ambrose Mantle, declines another  DEXA: 12/2020 normal, T -0.8 Plans to schedule with next mammogram 01/2023.  TD or Tdap: 2013 Shringrix: 01/2022 Covid 19: 2/2 + booster 12/2020  Names of Other Physician/Practitioners you currently use: 1. [facility]- here for primary care 2. Dr. Rodman Pickle opht, eye doctor, last visit 01/2023, low pressure glaucoma 3. Dr. Marquis Lunch, dentist, last visit 12/2022, q61m  Patient Care Team: Lucky Cowboy, MD as PCP - General (Internal Medicine) Hart Carwin, MD (Inactive) as Consulting Physician (Gastroenterology) Tracey Harries, MD as Consulting Physician (Obstetrics and Gynecology) Glendale Chard, DO as Consulting Physician (Neurology) Tat, Octaviano Batty, DO as Consulting Physician (Neurology)   Allergies Allergies  Allergen Reactions   Oxycodone-Acetaminophen Other (See Comments)    panic attack   Ibuprofen Hives   Macrodantin [Nitrofurantoin Macrocrystal] Rash    SURGICAL HISTORY She  has a past surgical history that includes right shoulder humerus fx (july 2003); right hand ulner nerve surgery (March 26, 2002); right elbow   surgery (March 26, 2002); right wrist plating (2004); right total hip arthroplasty (2008); right total knee arthroplasty (sept 2012); Tonsillectomy (age 2); Total hip arthroplasty (10/22/2011); Colonoscopy; Dilation and curettage of uterus; and Total knee arthroplasty (Left, 04/28/2022). FAMILY HISTORY Her family history includes Atrial fibrillation in her brother, brother, and mother; Breast cancer in her maternal aunt, maternal grandmother, and paternal aunt; Congestive Heart Failure in her mother; Diabetes in  her father and mother; Heart attack (age of onset: 64) in her father; Heart disease in her father and mother; Hypertension in her mother; Tremor in her father and paternal grandmother. SOCIAL HISTORY She  reports that she has never smoked. She has never used smokeless tobacco. She reports current alcohol use. She reports that she does not use drugs.  MEDICARE WELLNESS OBJECTIVES: Physical activity: Current Exercise Habits: Structured exercise class Cardiac risk factors:   Depression/mood screen:      04/14/2023   10:29 AM  Depression screen PHQ 2/9  Decreased Interest 0  Down, Depressed, Hopeless 0  PHQ - 2 Score 0    ADLs:     04/14/2023   10:29 AM 12/17/2022   12:30 AM  In your present state of health, do you have any difficulty performing the following activities:  Hearing? 0 0  Vision? 1 0  Comment Follows with eye dr/glaucoma/managed well   Difficulty concentrating or making decisions? 0 0  Walking or climbing stairs? 1 1  Comment Uses cane   Dressing or bathing? 0   Doing errands, shopping? 0 0    Cognitive Testing  Alert? Yes  Normal Appearance?Yes  Oriented to person? Yes  Place? Yes   Time? Yes  Recall of three objects?  Yes  Can perform simple calculations? Yes  Displays appropriate judgment?Yes  Can read the correct time from a watch face?Yes  EOL planning: Type of Advance Directive: Living will   Objective:   Today's Vitals   04/14/23 0931  BP: 136/66  Pulse: 77  Temp: 97.6 F (36.4 C)  SpO2: 98%  Weight: 171 lb 9.6 oz (77.8 kg)  Height: 5\' 5"  (1.651 m)      Body mass index is 28.56 kg/m.  General Appearance: Well nourished, in no apparent distress. Eyes: PERRLA, EOMs, conjunctiva no swelling or erythema Sinuses: No Frontal/maxillary tenderness ENT/Mouth: Ext aud canals bil obstructed by wax. No erythema, swelling, or exudate on post pharynx.  Tonsils not swollen or erythematous. Mildly HOH.  Neck: Supple, thyroid normal.  Respiratory:  Respiratory effort normal, BS equal bilaterally without rales, rhonchi, wheezing or stridor.  Cardio: RRR with no MRGs. Brisk peripheral pulses without edema.  Abdomen: Soft, + BS.  Non tender, no guarding, rebound, hernias, masses. Lymphatics: Non tender without  lymphadenopathy.  Musculoskeletal: Full ROM, 5/5 strength, mild valgus of left knee, mildly antalgic/unsteady gait. No effusion, laxity. Neg straight leg raise.  Skin: Warm, dry without rashes, lesions, ecchymosis. Left toenails thickened (reports did lamisil, declines further). Some callus of left toes.  Neuro: Cranial nerves intact. No cerebellar symptoms. Mildly decreased sensation to monofilament toe tips, otherwise intact. No tremor, cogwheeling.  Psych: Awake and oriented X 3, normal affect, Insight and Judgment appropriate.    Medicare Attestation I have personally reviewed: The patient's medical and social history Their use of alcohol, tobacco or illicit drugs Their current medications and supplements The patient's functional ability including ADLs,fall risks, home safety risks, cognitive, and hearing and visual impairment Diet and physical activities Evidence for depression or mood disorders  The patient's weight, height, BMI, and visual acuity have been recorded in the chart.  I have made referrals, counseling, and provided education to the patient based on review of the above and I have provided the patient with a written personalized care plan for preventive services.     Adela Glimpse, NP   04/14/2023

## 2023-04-14 ENCOUNTER — Encounter: Payer: Self-pay | Admitting: Nurse Practitioner

## 2023-04-14 ENCOUNTER — Ambulatory Visit (INDEPENDENT_AMBULATORY_CARE_PROVIDER_SITE_OTHER): Payer: Medicare HMO | Admitting: Nurse Practitioner

## 2023-04-14 VITALS — BP 136/66 | HR 77 | Temp 97.6°F | Ht 65.0 in | Wt 171.6 lb

## 2023-04-14 DIAGNOSIS — I10 Essential (primary) hypertension: Secondary | ICD-10-CM

## 2023-04-14 DIAGNOSIS — G20A2 Parkinson's disease without dyskinesia, with fluctuations: Secondary | ICD-10-CM

## 2023-04-14 DIAGNOSIS — H4010X0 Unspecified open-angle glaucoma, stage unspecified: Secondary | ICD-10-CM

## 2023-04-14 DIAGNOSIS — E782 Mixed hyperlipidemia: Secondary | ICD-10-CM | POA: Diagnosis not present

## 2023-04-14 DIAGNOSIS — R7309 Other abnormal glucose: Secondary | ICD-10-CM | POA: Diagnosis not present

## 2023-04-14 DIAGNOSIS — E559 Vitamin D deficiency, unspecified: Secondary | ICD-10-CM

## 2023-04-14 DIAGNOSIS — R6889 Other general symptoms and signs: Secondary | ICD-10-CM | POA: Diagnosis not present

## 2023-04-14 DIAGNOSIS — Z79899 Other long term (current) drug therapy: Secondary | ICD-10-CM

## 2023-04-14 DIAGNOSIS — F411 Generalized anxiety disorder: Secondary | ICD-10-CM | POA: Diagnosis not present

## 2023-04-14 DIAGNOSIS — Z0000 Encounter for general adult medical examination without abnormal findings: Secondary | ICD-10-CM

## 2023-04-14 DIAGNOSIS — Z0001 Encounter for general adult medical examination with abnormal findings: Secondary | ICD-10-CM

## 2023-04-14 DIAGNOSIS — M47812 Spondylosis without myelopathy or radiculopathy, cervical region: Secondary | ICD-10-CM

## 2023-04-14 DIAGNOSIS — E663 Overweight: Secondary | ICD-10-CM | POA: Diagnosis not present

## 2023-04-14 NOTE — Patient Instructions (Signed)

## 2023-04-15 LAB — COMPLETE METABOLIC PANEL WITH GFR
AG Ratio: 1.6 (calc) (ref 1.0–2.5)
ALT: 13 U/L (ref 6–29)
AST: 20 U/L (ref 10–35)
Albumin: 3.8 g/dL (ref 3.6–5.1)
Alkaline phosphatase (APISO): 86 U/L (ref 37–153)
BUN/Creatinine Ratio: 34 (calc) — ABNORMAL HIGH (ref 6–22)
BUN: 28 mg/dL — ABNORMAL HIGH (ref 7–25)
CO2: 30 mmol/L (ref 20–32)
Calcium: 8.8 mg/dL (ref 8.6–10.4)
Chloride: 102 mmol/L (ref 98–110)
Creat: 0.82 mg/dL (ref 0.60–0.95)
Globulin: 2.4 g/dL (ref 1.9–3.7)
Glucose, Bld: 87 mg/dL (ref 65–99)
Potassium: 4.2 mmol/L (ref 3.5–5.3)
Sodium: 139 mmol/L (ref 135–146)
Total Bilirubin: 0.5 mg/dL (ref 0.2–1.2)
Total Protein: 6.2 g/dL (ref 6.1–8.1)
eGFR: 72 mL/min/{1.73_m2} (ref >=60)

## 2023-04-15 LAB — CBC WITH DIFFERENTIAL/PLATELET
Absolute Monocytes: 746 {cells}/uL (ref 200–950)
Basophils Absolute: 57 {cells}/uL (ref 0–200)
Basophils Relative: 0.8 %
Eosinophils Absolute: 241 {cells}/uL (ref 15–500)
Eosinophils Relative: 3.4 %
HCT: 40.8 % (ref 35.0–45.0)
Hemoglobin: 13.5 g/dL (ref 11.7–15.5)
Lymphs Abs: 2229 {cells}/uL (ref 850–3900)
MCH: 30.1 pg (ref 27.0–33.0)
MCHC: 33.1 g/dL (ref 32.0–36.0)
MCV: 90.9 fL (ref 80.0–100.0)
MPV: 11.9 fL (ref 7.5–12.5)
Monocytes Relative: 10.5 %
Neutro Abs: 3827 {cells}/uL (ref 1500–7800)
Neutrophils Relative %: 53.9 %
Platelets: 253 10*3/uL (ref 140–400)
RBC: 4.49 10*6/uL (ref 3.80–5.10)
RDW: 13.6 % (ref 11.0–15.0)
Total Lymphocyte: 31.4 %
WBC: 7.1 10*3/uL (ref 3.8–10.8)

## 2023-04-15 LAB — LIPID PANEL
Cholesterol: 150 mg/dL (ref <200)
HDL: 65 mg/dL (ref >=50)
LDL Cholesterol (Calc): 63 mg/dL
Non-HDL Cholesterol (Calc): 85 mg/dL (calc) (ref <130)
Total CHOL/HDL Ratio: 2.3 (calc) (ref <5.0)
Triglycerides: 132 mg/dL (ref <150)

## 2023-04-15 LAB — HEMOGLOBIN A1C
Hgb A1c MFr Bld: 6 %{Hb} — ABNORMAL HIGH (ref <5.7)
Mean Plasma Glucose: 126 mg/dL
eAG (mmol/L): 7 mmol/L

## 2023-06-27 ENCOUNTER — Other Ambulatory Visit: Payer: Self-pay | Admitting: Neurology

## 2023-06-27 DIAGNOSIS — G20A1 Parkinson's disease without dyskinesia, without mention of fluctuations: Secondary | ICD-10-CM

## 2023-07-05 DIAGNOSIS — H43813 Vitreous degeneration, bilateral: Secondary | ICD-10-CM | POA: Diagnosis not present

## 2023-07-05 DIAGNOSIS — H401131 Primary open-angle glaucoma, bilateral, mild stage: Secondary | ICD-10-CM | POA: Diagnosis not present

## 2023-07-05 NOTE — Progress Notes (Unsigned)
Assessment/Plan:   1.  Parkinsons Disease  -DaTscan was abnormal November 25, 2021 with decreased radiotracer activity bilaterally, right greater than left.  -she will continue carbidopa/levodopa 25/100, 1 tablet 3 times per day.  2.  B12 deficiency  -Now on oral supplementation.  3.  Severe atrophy - R iliopsoas, R glut medius and minimus  -pt thinks sx's x close to 3 years with balance/ambulation issues  -MRI lumbar spine without contrast  -EMG without evidence of myopathy.EMG demonstrated neurogenic changes involving rectus femoris, adductor longus, iliopsoas and gluteus medius muscles.  Dr. Allena Katz and patient discussed CSF testing/muscle biopsy testing, but both agreed to hold off on those things as yield was low and she is following with Dr. Allena Katz clinically.  She has a follow-up scheduled in January, 2025.   Subjective:   Gwendolyn [name] was seen today in follow up for Parkinsons disease.  My previous records were reviewed prior to todays visit as well as outside records available to me.   Patient's MRI previously demonstrated severe atrophy of the R iliopsoas, R glut minimus and medius muscles.  She subsequently saw Dr. Allena Katz.  EMG demonstrated neurogenic changes involving rectus femoris, adductor longus, iliopsoas and gluteus medius muscles.  There was no evidence of myopathy.  She felt that lumbar plexopathy was unlikely given normal sensory responses on EMG.  They mutually decided to hold off on further testing and reassess with Dr. Allena Katz.  Current prescribed movement disorder medications: Carbidopa/levodopa 25/100, 1 tablet 3 times per day (7-8am/11am/4pm)   ALLERGIES:   Allergies  Allergen Reactions   Oxycodone-Acetaminophen Other (See Comments)    panic attack   Ibuprofen Hives   Macrodantin [Nitrofurantoin Macrocrystal] Rash    CURRENT MEDICATIONS:  No outpatient medications have been marked as taking for the 07/06/23 encounter (Appointment) with Royal Vandevoort, Octaviano Batty,  DO.     Objective:   PHYSICAL EXAMINATION:    VITALS:   There were no vitals filed for this visit.     GEN:  The patient appears stated age and is in NAD. HEENT:  Normocephalic.  Healing/old area of ecchymosis around the R eye  The mucous membranes are moist. The superficial temporal arteries are without ropiness or tenderness. CV:  RRR Lungs:  CTAB Neck/HEME:  There are no carotid bruits bilaterally.  Neurological examination:  Orientation: The patient is alert and oriented x3. Cranial nerves: There is good facial symmetry without facial hypomimia. The speech is fluent and clear. Soft palate rises symmetrically and there is no tongue deviation. Hearing is intact to conversational tone. Sensation: Sensation is intact to light touch throughout Motor: Strength is at least antigravity x4.  Movement examination: Tone: There is no increased tone (nl bilaterally) Abnormal movements: none even with distraction in the hands/legs.  Has some mouth tremor Coordination:  There is no decremation, with any form of RAMS, including alternating supination and pronation of the forearm, hand opening and closing, finger taps, heel taps and toe taps. Gait and Station: The patient pushes off to arise.  Walks with cane.  She is antalgic.  She continues to have evidence of gluteal muscle weakness on the right.  No shuffling.    I have reviewed and interpreted the following labs independently    Chemistry      Component Value Date/Time   NA 139 04/14/2023 1035   K 4.2 04/14/2023 1035   CL 102 04/14/2023 1035   CO2 30 04/14/2023 1035   BUN 28 (H) 04/14/2023 1035  CREATININE 0.82 04/14/2023 1035      Component Value Date/Time   CALCIUM 8.8 04/14/2023 1035   ALKPHOS 87 03/31/2017 0927   AST 20 04/14/2023 1035   ALT 13 04/14/2023 1035   BILITOT 0.5 04/14/2023 1035       Lab Results  Component Value Date   WBC 7.1 04/14/2023   HGB 13.5 04/14/2023   HCT 40.8 04/14/2023   MCV 90.9  04/14/2023   PLT 253 04/14/2023    Lab Results  Component Value Date   TSH 1.02 01/06/2023   Total time spent on today's visit was *** minutes, including both face-to-face time and nonface-to-face time.  Time included that spent on review of records (prior notes available to me/labs/imaging if pertinent), discussing treatment and goals, answering patient's questions and coordinating care.    Cc:  Lucky Cowboy, MD

## 2023-07-06 ENCOUNTER — Encounter: Payer: Self-pay | Admitting: Neurology

## 2023-07-06 ENCOUNTER — Ambulatory Visit: Payer: Medicare HMO | Admitting: Neurology

## 2023-07-06 VITALS — BP 126/80 | HR 73 | Ht 65.0 in | Wt 177.0 lb

## 2023-07-06 DIAGNOSIS — G718 Other primary disorders of muscles: Secondary | ICD-10-CM

## 2023-07-06 DIAGNOSIS — G20A1 Parkinson's disease without dyskinesia, without mention of fluctuations: Secondary | ICD-10-CM

## 2023-07-28 ENCOUNTER — Ambulatory Visit: Payer: Medicare HMO | Admitting: Nurse Practitioner

## 2023-08-02 ENCOUNTER — Encounter: Payer: Self-pay | Admitting: Nurse Practitioner

## 2023-08-02 ENCOUNTER — Ambulatory Visit (INDEPENDENT_AMBULATORY_CARE_PROVIDER_SITE_OTHER): Payer: Medicare HMO | Admitting: Nurse Practitioner

## 2023-08-02 VITALS — BP 126/68 | HR 62 | Temp 97.9°F | Ht 65.0 in | Wt 174.4 lb

## 2023-08-02 DIAGNOSIS — Z79899 Other long term (current) drug therapy: Secondary | ICD-10-CM

## 2023-08-02 DIAGNOSIS — E559 Vitamin D deficiency, unspecified: Secondary | ICD-10-CM

## 2023-08-02 DIAGNOSIS — R7309 Other abnormal glucose: Secondary | ICD-10-CM | POA: Diagnosis not present

## 2023-08-02 DIAGNOSIS — M47812 Spondylosis without myelopathy or radiculopathy, cervical region: Secondary | ICD-10-CM | POA: Diagnosis not present

## 2023-08-02 DIAGNOSIS — F411 Generalized anxiety disorder: Secondary | ICD-10-CM | POA: Diagnosis not present

## 2023-08-02 DIAGNOSIS — I10 Essential (primary) hypertension: Secondary | ICD-10-CM | POA: Diagnosis not present

## 2023-08-02 DIAGNOSIS — G20A2 Parkinson's disease without dyskinesia, with fluctuations: Secondary | ICD-10-CM

## 2023-08-02 DIAGNOSIS — E663 Overweight: Secondary | ICD-10-CM

## 2023-08-02 DIAGNOSIS — E782 Mixed hyperlipidemia: Secondary | ICD-10-CM | POA: Diagnosis not present

## 2023-08-02 DIAGNOSIS — H4010X0 Unspecified open-angle glaucoma, stage unspecified: Secondary | ICD-10-CM

## 2023-08-02 NOTE — Patient Instructions (Signed)

## 2023-08-02 NOTE — Progress Notes (Signed)
FOLLOW UP  Assessment:   Gwendolyn [name] was seen today for follow-up and medicare wellness.  Diagnoses and all orders for this visit:  Essential hypertension Discussed DASH (Dietary Approaches to Stop Hypertension) DASH diet is lower in sodium than a typical American diet. Cut back on foods that are high in saturated fat, cholesterol, and trans fats. Eat more whole-grain foods, fish, poultry, and nuts Remain active and exercise as tolerated daily.  Monitor BP at home-Call if greater than 130/80.  Check CMP/CBC  Parkinson's Disease (HCC) Dr. Arbutus Leas is managing,  Continue Sinemet IR Continue regular exercise/balance program   Hyperlipidemia, mixed Continue lifestyle modifications. Recommended diet heavy in fruits and veggies, omega 3's. Decrease consumption of animal meats, cheeses, and dairy products. Remain active and exercise as tolerated. Continue to monitor. Check lipids/TSH  Abnormal glucose Education: Reviewed 'ABCs' of diabetes management  Discussed goals to be met and/or maintained include A1C (<7) Blood pressure (<130/80) Cholesterol (LDL <70) Continue Eye Exam yearly  Continue Dental Exam Q6 mo Discussed dietary recommendations Discussed Physical Activity recommendations Check A1C  Overweight (BMI 25.0-29.9) Discussed appropriate BMI Diet modification. Physical activity. Encouraged/praised to build confidence.  Generalized anxiety disorder Continue current regimen, rare xanax use, monitor PDMP with refill requests Discussed stress management techniques   Discussed good sleep hygiene Discussed increasing physical activity and exercise  Open-angle glaucoma, unspecified glaucoma stage, unspecified laterality, unspecified open-angle glaucoma type Continue gtts as prescribed Continue yearly eye exams  Cervical arthritis/radicular pain Continue Lyrica Follow with Orthopedics PRN  Vitamin D deficiency Continue supplement for goal of 60-100 Monitor Vitamin D  levels  Medication management All medications discussed and reviewed in full. All questions and concerns regarding medications addressed.    Orders Placed This Encounter  Procedures   CBC with Differential/Platelet   COMPLETE METABOLIC PANEL WITH GFR   Lipid panel   Hemoglobin A1c    Notify office for further evaluation and treatment, questions or concerns if any reported s/s fail to improve.   The patient was advised to call back or seek an in-person evaluation if any symptoms worsen or if the condition fails to improve as anticipated.   Further disposition pending results of labs. Discussed med's effects and SE's.    I discussed the assessment and treatment plan with the patient. The patient was provided an opportunity to ask questions and all were answered. The patient agreed with the plan and demonstrated an understanding of the instructions.  Discussed med's effects and SE's. Screening labs and tests as requested with regular follow-up as recommended.  I provided 20 minutes of face-to-face time during this encounter including counseling, chart review, and critical decision making was preformed.  Today's Plan of Care is based on a patient-centered health care approach known as shared decision making - the decisions, tests and treatments allow for patient preferences and values to be balanced with clinical evidence.    Future Appointments  Date Time Provider Department Center  09/27/2023  9:30 AM Nita Sickle K, DO [facility] None  01/06/2024  2:30 PM Tat, Octaviano Batty, DO [facility] None  01/19/2024 10:00 AM Lucky Cowboy, MD [facility] None  04/13/2024 10:00 AM Adela Glimpse, NP [facility] None    Subjective:   Gwendolyn [name] is a 82 y.o. female who presents for a 3 month follow up on hypertension, prediabetes, hyperlipidemia, vitamin D def.  Overall she reports feeling well today.    She has hx of anxiety, has been prescribed xanax though improved.   She reports last  use  was last week, will go months without needing.   Cervical arthritis/radicular pain, managing fairly with lifestyle modification (sleeping on back, flatter pillow), using aleve PRN, lyrica.    She reports having numb toes, burning in left calf/leg pain, having some difficulty walking due to this, has discussed with est ortho Dr. Charlann Boxer, did PT without improvement.  Had lumbar and pelvic MRI that showed arthritic changes.  DEXA Scan 2022 WNL T Score -0.8.  Continues to use support of a cane.  Attending water aerobics once weekly and balance classes biweekly.   She was evaluated by Dr. Arbutus Leas due to change in gait, pill rolling, was formally dx with Parkinson's started on sinemet IR TID, patient hasn't noted particular changes with this but reports minimal sx to begin with, managing well.   BMI is Body mass index is 29.02 kg/m., she has been working on diet and exercise Wt Readings from Last 3 Encounters:  08/02/23 174 lb 6.4 oz (79.1 kg)  07/06/23 177 lb (80.3 kg)  04/14/23 171 lb 9.6 oz (77.8 kg)   Her blood pressure has been controlled at home, today their BP is BP: 126/68  She does not workout. She denies chest pain, shortness of breath, dizziness. She takes lasix AS needed for swelling.    She is on cholesterol medication and has myalgias with crestor (20 mg three times a week) on the days she takes it. Her cholesterol is at goal. The cholesterol last visit was:   Lab Results  Component Value Date   CHOL 150 04/14/2023   HDL 65 04/14/2023   LDLCALC 63 04/14/2023   TRIG 132 04/14/2023   CHOLHDL 2.3 04/14/2023    She has been working on diet and exercise for glucose management, and denies paresthesia of the feet, polydipsia, polyuria and visual disturbances. Last A1C in the office was:  Lab Results  Component Value Date   HGBA1C 6.0 (H) 04/14/2023   Lab Results  Component Value Date   EGFR 72 04/14/2023   Patient is on Vitamin D supplement.   Lab Results  Component Value Date    VD25OH 81 01/06/2023       Medication Review Current Outpatient Medications on File Prior to Visit  Medication Sig   ALPRAZolam (XANAX) 0.5 MG tablet TAKE 1/2 TO 1 (ONE-HALF TO ONE) TABLET BY MOUTH THREE TIMES DAILY AS NEEDED FOR ANXIETY   ascorbic acid (VITAMIN C) 500 MG tablet Take 500 mg by mouth daily.   carbidopa-levodopa (SINEMET IR) 25-100 MG tablet TAKE 1 TABLET BY MOUTH 3 TIMES A DAY (7AM, 11AM, AND 4PM)   CHELATED MAGNESIUM PO Take 250 mg by mouth 2 (two) times daily.   cholecalciferol (VITAMIN D3) 25 MCG (1000 UNIT) tablet Take 1,000 Units by mouth in the morning and at bedtime.   CRANBERRY PO Take by mouth daily.   cyanocobalamin (VITAMIN B12) 1000 MCG tablet Take 1,000 mcg by mouth daily.   fexofenadine (ALLEGRA) 180 MG tablet Take 180 mg by mouth daily as needed for allergies or rhinitis.   furosemide (LASIX) 40 MG tablet Take 1 tablet (40 mg total) by mouth as needed for edema.   losartan-hydrochlorothiazide (HYZAAR) 50-12.5 MG tablet TAKE ONE TABLET BY MOUTH DAILY FOR BLOOD PRESSURE   Omega-3 Fatty Acids (FISH OIL) 1000 MG CAPS Take 1,000 mg by mouth 2 (two) times daily.   pregabalin (LYRICA) 150 MG capsule Take  1 capsule  at Bedtime  for Sleep & Chronic Pain                                   /  TAKE                                   BY                              MOUTH   rosuvastatin (CRESTOR) 20 MG tablet Take 1 tablet MWF for Cholesterol   timolol (TIMOPTIC) 0.5 % ophthalmic solution Place 1 drop into both eyes every morning.    zinc gluconate 50 MG tablet Take 50 mg by mouth daily.   No current facility-administered medications on file prior to visit.    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   Parkinson's disease (HCC) 11/28/2021   B12 deficiency 05/28/2021   Cervical arthritis 09/27/2017   Open-angle glaucoma 05/14/2016   Generalized anxiety disorder 02/11/2016    Overweight (BMI 25.0-29.9) 04/25/2015   Medication management 02/12/2014   Essential hypertension 08/08/2013   Hyperlipidemia, mixed 08/08/2013   Abnormal glucose 08/08/2013   Vitamin D deficiency 08/08/2013    Screening Tests Immunization History  Administered Date(s) Administered   Fluad Quad(high Dose 65+) 05/23/2021   Influenza, High Dose Seasonal PF 06/06/2014, 05/14/2016, 06/24/2018, 06/05/2019, 06/13/2020, 06/29/2022   Influenza-Unspecified 06/13/2013, 05/02/2015, 06/24/2018, 06/30/2023   Moderna Sars-Covid-2 Vaccination 09/13/2019, 10/11/2019, 07/01/2020, 01/17/2021   Pneumococcal Conjugate-13 05/24/2014   Pneumococcal Polysaccharide-23 01/08/2009   Td 02/12/2022   Tdap 01/28/2012   Unspecified SARS-COV-2 Vaccination 06/30/2023   Zoster Recombinant(Shingrix) 02/24/2022, 06/29/2022   Zoster, Live 08/31/2006   Health Maintenance  Topic Date Due   COVID-19 Vaccine (6 - 2023-24 season) 08/25/2023   MAMMOGRAM  01/04/2024   Medicare Annual Wellness (AWV)  04/13/2024   DTaP/Tdap/Td (3 - Td or Tdap) 02/13/2032   Pneumonia Vaccine 22+ Years old  Completed   INFLUENZA VACCINE  Completed   DEXA SCAN  Completed   Zoster Vaccines- Shingrix  Completed   HPV VACCINES  Aged Out    Patient Care Team: Lucky Cowboy, MD as PCP - General (Internal Medicine) Hart Carwin, MD (Inactive) as Consulting Physician (Gastroenterology) Tracey Harries, MD as Consulting Physician (Obstetrics and Gynecology) Glendale Chard, DO as Consulting Physician (Neurology) Tat, Octaviano Batty, DO as Consulting Physician (Neurology)   Allergies Allergies  Allergen Reactions   Oxycodone-Acetaminophen Other (See Comments)    panic attack   Ibuprofen Hives   Macrodantin [Nitrofurantoin Macrocrystal] Rash    SURGICAL HISTORY She  has a past surgical history that includes right shoulder humerus fx (july 2003); right hand ulner nerve surgery (March 26, 2002); right elbow   surgery (March 26, 2002);  right wrist plating (2004); right total hip arthroplasty (2008); right total knee arthroplasty (sept 2012); Tonsillectomy (age 89); Total hip arthroplasty (10/22/2011); Colonoscopy; Dilation and curettage of uterus; and Total knee arthroplasty (Left, 04/28/2022). FAMILY HISTORY Her family history includes Atrial fibrillation in her brother, brother, and mother; Breast cancer in her maternal aunt, maternal grandmother, and paternal aunt; Congestive Heart Failure in her mother; Diabetes in her father and mother; Heart attack (age of onset: 58) in her father; Heart disease in her father and mother; Hypertension in her mother; Tremor in her father and paternal grandmother. SOCIAL HISTORY She  reports that she has never smoked. She has never used smokeless tobacco. She reports current alcohol use. She reports that she does not use  drugs.  Objective:   Today's Vitals   08/02/23 1024  BP: 126/68  Pulse: 62  Temp: 97.9 F (36.6 C)  SpO2: 99%  Weight: 174 lb 6.4 oz (79.1 kg)  Height: 5\' 5"  (1.651 m)   Body mass index is 29.02 kg/m.  General Appearance: Well nourished, in no apparent distress. Eyes: PERRLA, EOMs, conjunctiva no swelling or erythema Sinuses: No Frontal/maxillary tenderness ENT/Mouth: Ext aud canals bil obstructed by wax. No erythema, swelling, or exudate on post pharynx.  Tonsils not swollen or erythematous. Mildly HOH.  Neck: Supple, thyroid normal.  Respiratory: Respiratory effort normal, BS equal bilaterally without rales, rhonchi, wheezing or stridor.  Cardio: RRR with no MRGs. Brisk peripheral pulses without edema.  Abdomen: Soft, + BS.  Non tender, no guarding, rebound, hernias, masses. Lymphatics: Non tender without lymphadenopathy.  Musculoskeletal: Full ROM, 5/5 strength, mild valgus of left knee, mildly antalgic/unsteady gait. No effusion, laxity. Neg straight leg raise.  Skin: Warm, dry without rashes, lesions, ecchymosis. Left toenails thickened (reports did lamisil,  declines further). Some callus of left toes.  Neuro: Cranial nerves intact. No cerebellar symptoms. Mildly decreased sensation to monofilament toe tips, otherwise intact. No tremor, cogwheeling.  Psych: Awake and oriented X 3, normal affect, Insight and Judgment appropriate.   Adela Glimpse, NP   08/02/2023

## 2023-08-03 LAB — HEMOGLOBIN A1C
Hgb A1c MFr Bld: 5.8 %{Hb} — ABNORMAL HIGH (ref <5.7)
Mean Plasma Glucose: 120 mg/dL
eAG (mmol/L): 6.6 mmol/L

## 2023-08-03 LAB — CBC WITH DIFFERENTIAL/PLATELET
Absolute Lymphocytes: 1534 {cells}/uL (ref 850–3900)
Absolute Monocytes: 644 {cells}/uL (ref 200–950)
Basophils Absolute: 39 {cells}/uL (ref 0–200)
Basophils Relative: 0.7 %
Eosinophils Absolute: 179 {cells}/uL (ref 15–500)
Eosinophils Relative: 3.2 %
HCT: 39 % (ref 35.0–45.0)
Hemoglobin: 12.8 g/dL (ref 11.7–15.5)
MCH: 30.3 pg (ref 27.0–33.0)
MCHC: 32.8 g/dL (ref 32.0–36.0)
MCV: 92.4 fL (ref 80.0–100.0)
MPV: 12.2 fL (ref 7.5–12.5)
Monocytes Relative: 11.5 %
Neutro Abs: 3203 {cells}/uL (ref 1500–7800)
Neutrophils Relative %: 57.2 %
Platelets: 259 10*3/uL (ref 140–400)
RBC: 4.22 10*6/uL (ref 3.80–5.10)
RDW: 13 % (ref 11.0–15.0)
Total Lymphocyte: 27.4 %
WBC: 5.6 10*3/uL (ref 3.8–10.8)

## 2023-08-03 LAB — COMPLETE METABOLIC PANEL WITH GFR
AG Ratio: 1.5 (calc) (ref 1.0–2.5)
ALT: 6 U/L (ref 6–29)
AST: 22 U/L (ref 10–35)
Albumin: 4 g/dL (ref 3.6–5.1)
Alkaline phosphatase (APISO): 83 U/L (ref 37–153)
BUN/Creatinine Ratio: 40 (calc) — ABNORMAL HIGH (ref 6–22)
BUN: 31 mg/dL — ABNORMAL HIGH (ref 7–25)
CO2: 29 mmol/L (ref 20–32)
Calcium: 9.1 mg/dL (ref 8.6–10.4)
Chloride: 103 mmol/L (ref 98–110)
Creat: 0.78 mg/dL (ref 0.60–0.95)
Globulin: 2.6 g/dL (ref 1.9–3.7)
Glucose, Bld: 87 mg/dL (ref 65–99)
Potassium: 4.3 mmol/L (ref 3.5–5.3)
Sodium: 139 mmol/L (ref 135–146)
Total Bilirubin: 0.4 mg/dL (ref 0.2–1.2)
Total Protein: 6.6 g/dL (ref 6.1–8.1)
eGFR: 76 mL/min/{1.73_m2} (ref >=60)

## 2023-08-03 LAB — LIPID PANEL
Cholesterol: 166 mg/dL (ref <200)
HDL: 60 mg/dL (ref >=50)
LDL Cholesterol (Calc): 81 mg/dL
Non-HDL Cholesterol (Calc): 106 mg/dL (ref <130)
Total CHOL/HDL Ratio: 2.8 (calc) (ref <5.0)
Triglycerides: 145 mg/dL (ref <150)

## 2023-08-19 ENCOUNTER — Encounter: Payer: Self-pay | Admitting: Neurology

## 2023-09-27 ENCOUNTER — Ambulatory Visit: Payer: Medicare HMO | Admitting: Neurology

## 2023-09-30 ENCOUNTER — Other Ambulatory Visit: Payer: Self-pay | Admitting: Neurology

## 2023-09-30 DIAGNOSIS — G20A1 Parkinson's disease without dyskinesia, without mention of fluctuations: Secondary | ICD-10-CM

## 2023-10-02 ENCOUNTER — Telehealth: Payer: HMO | Admitting: Family Medicine

## 2023-10-02 DIAGNOSIS — R059 Cough, unspecified: Secondary | ICD-10-CM | POA: Diagnosis not present

## 2023-10-02 MED ORDER — BENZONATATE 100 MG PO CAPS
100.0000 mg | ORAL_CAPSULE | Freq: Three times a day (TID) | ORAL | 0 refills | Status: AC | PRN
Start: 2023-10-02 — End: 2023-10-09

## 2023-10-02 NOTE — Progress Notes (Signed)
Virtual Visit Consent   Gwendolyn [name], you are scheduled for a virtual visit with a  provider today. Just as with appointments in the office, your consent must be obtained to participate. Your consent will be active for this visit and any virtual visit you may have with one of our providers in the next 365 days. If you have a MyChart account, a copy of this consent can be sent to you electronically.  As this is a virtual visit, video technology does not allow for your provider to perform a traditional examination. This may limit your provider's ability to fully assess your condition. If your provider identifies any concerns that need to be evaluated in person or the need to arrange testing (such as labs, EKG, etc.), we will make arrangements to do so. Although advances in technology are sophisticated, we cannot ensure that it will always work on either your end or our end. If the connection with a video visit is poor, the visit may have to be switched to a telephone visit. With either a video or telephone visit, we are not always able to ensure that we have a secure connection.  By engaging in this virtual visit, you consent to the provision of healthcare and authorize for your insurance to be billed (if applicable) for the services provided during this visit. Depending on your insurance coverage, you may receive a charge related to this service.  I need to obtain your verbal consent now. Are you willing to proceed with your visit today? Gwendolyn [name] has provided verbal consent on 10/02/2023 for a virtual visit (video or telephone). Reed Pandy, [place]  Date: 10/02/2023 2:46 PM  Virtual Visit via Video Note   I, Reed Pandy, connected with  Gwendolyn [name]  ([id number], 07/13/1941) on 10/02/23 at  2:45 PM EST by a video-enabled telemedicine application and verified that I am speaking with the correct person using two identifiers.  Location: Patient: Virtual Visit Location  Patient: Home Provider: Virtual Visit Location Provider: Home Office   I discussed the limitations of evaluation and management by telemedicine and the availability of in person appointments. The patient expressed understanding and agreed to proceed.    History of Present Illness: Gwendolyn [name] is a 83 y.o. who identifies as a female who was assigned female at birth, and is being seen today for c/o sore throat and cough that started last Saturday.  Pt states coughing is worse at night.  Pt states was afraid to take Mucinex because she was diagnosed with Parkinsons.  Pt states coughing is worse when she lays down and she is not coughing anything up just having issues with coughing.   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   Parkinson's disease (HCC) 11/28/2021   B12 deficiency 05/28/2021   Cervical arthritis 09/27/2017   Open-angle glaucoma 05/14/2016   Generalized anxiety disorder 02/11/2016   Overweight (BMI 25.0-29.9) 04/25/2015   Medication management 02/12/2014   Essential hypertension 08/08/2013   Hyperlipidemia, mixed 08/08/2013   Abnormal glucose 08/08/2013   Vitamin D deficiency 08/08/2013    Allergies:  Allergies  Allergen Reactions   Oxycodone-Acetaminophen Other (See Comments)    panic attack   Ibuprofen Hives   Macrodantin [Nitrofurantoin Macrocrystal] Rash   Medications:  Current Outpatient Medications:    benzonatate (TESSALON) 100 MG capsule, Take 1 capsule (100 mg total) by mouth 3 (three) times daily as  needed for up to 7 days., Disp: 21 capsule, Rfl: 0   ALPRAZolam (XANAX) 0.5 MG tablet, TAKE 1/2 TO 1 (ONE-HALF TO ONE) TABLET BY MOUTH THREE TIMES DAILY AS NEEDED FOR ANXIETY, Disp: 90 tablet, Rfl: 0   ascorbic acid (VITAMIN C) 500 MG tablet, Take 500 mg by mouth daily., Disp: , Rfl:    carbidopa-levodopa (SINEMET IR) 25-100 MG tablet, TAKE 1 TABLET BY MOUTH 3 TIMES A DAY **TAKE  AT 7AM, 11AM, AND 4PM**, Disp: 270 tablet, Rfl: 0   CHELATED MAGNESIUM PO, Take 250 mg by mouth 2 (two) times daily., Disp: , Rfl:    cholecalciferol (VITAMIN D3) 25 MCG (1000 UNIT) tablet, Take 1,000 Units by mouth in the morning and at bedtime., Disp: , Rfl:    CRANBERRY PO, Take by mouth daily., Disp: , Rfl:    cyanocobalamin (VITAMIN B12) 1000 MCG tablet, Take 1,000 mcg by mouth daily., Disp: , Rfl:    fexofenadine (ALLEGRA) 180 MG tablet, Take 180 mg by mouth daily as needed for allergies or rhinitis., Disp: , Rfl:    furosemide (LASIX) 40 MG tablet, Take 1 tablet (40 mg total) by mouth as needed for edema., Disp: 30 tablet, Rfl: 3   losartan-hydrochlorothiazide (HYZAAR) 50-12.5 MG tablet, TAKE ONE TABLET BY MOUTH DAILY FOR BLOOD PRESSURE, Disp: 90 tablet, Rfl: 3   Omega-3 Fatty Acids (FISH OIL) 1000 MG CAPS, Take 1,000 mg by mouth 2 (two) times daily., Disp: , Rfl:    pregabalin (LYRICA) 150 MG capsule, Take  1 capsule  at Bedtime  for Sleep & Chronic Pain                                   /               TAKE                                   BY                              MOUTH, Disp: 360 capsule, Rfl: 0   rosuvastatin (CRESTOR) 20 MG tablet, Take 1 tablet MWF for Cholesterol, Disp: 36 tablet, Rfl: 3   timolol (TIMOPTIC) 0.5 % ophthalmic solution, Place 1 drop into both eyes every morning. , Disp: , Rfl:    zinc gluconate 50 MG tablet, Take 50 mg by mouth daily., Disp: , Rfl:   Observations/Objective: Patient is well-developed, well-nourished in no acute distress.  Resting comfortably at home.  Head is normocephalic, atraumatic.  No labored breathing.  Speech is clear and coherent with logical content.  Patient is alert and oriented at baseline.   Assessment and Plan: 1. Cough in adult (Primary) - benzonatate (TESSALON) 100 MG capsule; Take 1 capsule (100 mg total) by mouth 3 (three) times daily as needed for up to 7 days.  Dispense: 21 capsule; Refill: 0 -Pt to continue over the  counter Mucinex as indicated  -Start Benzonatate as needed -Advised Pt to follow up with PCP or urgent care if symptoms persist or worsen.    Follow Up Instructions: I discussed the assessment and treatment plan with the patient. The patient was provided an opportunity to ask questions and all were answered. The patient agreed with the plan and demonstrated an understanding of  the instructions.  A copy of instructions were sent to the patient via MyChart unless otherwise noted below.    The patient was advised to call back or seek an in-person evaluation if the symptoms worsen or if the condition fails to improve as anticipated.    Reed Pandy, PA-C

## 2023-10-02 NOTE — Patient Instructions (Signed)
Gwendolyn Alberts [place], thank you for joining Reed Pandy, PA-C for today's virtual visit.  While this provider is not your primary care provider (PCP), if your PCP is located in our provider database this encounter information will be shared with them immediately following your visit.   A [facility] MyChart account gives you access to today's visit and all your visits, tests, and labs performed at [facility] " click here if you don't have a [facility] MyChart account or go to [email or web address]  Consent: (Patient) Gwendolyn [name] provided verbal consent for this virtual visit at the beginning of the encounter.  Current Medications:  Current Outpatient Medications:    benzonatate (TESSALON) 100 MG capsule, Take 1 capsule (100 mg total) by mouth 3 (three) times daily as needed for up to 7 days., Disp: 21 capsule, Rfl: 0   ALPRAZolam (XANAX) 0.5 MG tablet, TAKE 1/2 TO 1 (ONE-HALF TO ONE) TABLET BY MOUTH THREE TIMES DAILY AS NEEDED FOR ANXIETY, Disp: 90 tablet, Rfl: 0   ascorbic acid (VITAMIN C) 500 MG tablet, Take 500 mg by mouth daily., Disp: , Rfl:    carbidopa-levodopa (SINEMET IR) 25-100 MG tablet, TAKE 1 TABLET BY MOUTH 3 TIMES A DAY **TAKE AT 7AM, 11AM, AND 4PM**, Disp: 270 tablet, Rfl: 0   CHELATED MAGNESIUM PO, Take 250 mg by mouth 2 (two) times daily., Disp: , Rfl:    cholecalciferol (VITAMIN D3) 25 MCG (1000 UNIT) tablet, Take 1,000 Units by mouth in the morning and at bedtime., Disp: , Rfl:    CRANBERRY PO, Take by mouth daily., Disp: , Rfl:    cyanocobalamin (VITAMIN B12) 1000 MCG tablet, Take 1,000 mcg by mouth daily., Disp: , Rfl:    fexofenadine (ALLEGRA) 180 MG tablet, Take 180 mg by mouth daily as needed for allergies or rhinitis., Disp: , Rfl:    furosemide (LASIX) 40 MG tablet, Take 1 tablet (40 mg total) by mouth as needed for edema., Disp: 30 tablet, Rfl: 3   losartan-hydrochlorothiazide (HYZAAR) 50-12.5 MG tablet, TAKE ONE TABLET BY MOUTH DAILY FOR  BLOOD PRESSURE, Disp: 90 tablet, Rfl: 3   Omega-3 Fatty Acids (FISH OIL) 1000 MG CAPS, Take 1,000 mg by mouth 2 (two) times daily., Disp: , Rfl:    pregabalin (LYRICA) 150 MG capsule, Take  1 capsule  at Bedtime  for Sleep & Chronic Pain                                   /               TAKE                                   BY                              MOUTH, Disp: 360 capsule, Rfl: 0   rosuvastatin (CRESTOR) 20 MG tablet, Take 1 tablet MWF for Cholesterol, Disp: 36 tablet, Rfl: 3   timolol (TIMOPTIC) 0.5 % ophthalmic solution, Place 1 drop into both eyes every morning. , Disp: , Rfl:    zinc gluconate 50 MG tablet, Take 50 mg by mouth daily., Disp: , Rfl:    Medications ordered in this encounter:  Meds ordered this encounter  Medications   benzonatate (TESSALON) 100  MG capsule    Sig: Take 1 capsule (100 mg total) by mouth 3 (three) times daily as needed for up to 7 days.    Dispense:  21 capsule    Refill:  0     *If you need refills on other medications prior to your next appointment, please contact your pharmacy*  Follow-Up: Call back or seek an in-person evaluation if the symptoms worsen or if the condition fails to improve as anticipated.  [facility] [phone]  Other Instructions Cough, Adult Coughing is a reflex that clears your throat and airways (respiratory system). It helps heal and protect your lungs. It is normal to cough from time to time. A cough that happens with other symptoms or that lasts a long time may be a sign of a condition that needs treatment. A short-term (acute) cough may only last 2-3 weeks. A long-term (chronic) cough may last 8 or more weeks. Coughing is often caused by: Diseases, such as: An infection of the respiratory system. Asthma or other heart or lung diseases. Gastroesophageal reflux. This is when acid comes back up from the stomach. Breathing in things that irritate your lungs. Allergies. Postnasal drip. This is when  mucus runs down the back of your throat. Smoking. Some medicines. Follow these instructions at home: Medicines Take over-the-counter and prescription medicines only as told by your health care provider. Talk with your provider before you take cough medicine (cough suppressants). Eating and drinking Do not drink alcohol. Avoid caffeine. Drink enough fluid to keep your pee (urine) pale yellow. Lifestyle Avoid cigarette smoke. Do not use any products that contain nicotine or tobacco. These products include cigarettes, chewing tobacco, and vaping devices, such as e-cigarettes. If you need help quitting, ask your provider. Avoid things that make you cough. These may include perfumes, candles, cleaning products, or campfire smoke. General instructions  Watch for any changes to your cough. Tell your provider about them. Always cover your mouth when you cough. If the air is dry in your bedroom or home, use a cool mist vaporizer or humidifier. If your cough is worse at night, try to sleep in a semi-upright position. Rest as needed. Contact a health care provider if: You have new symptoms, or your symptoms get worse. You cough up pus. You have a fever that does not go away or a cough that does not get better after 2-3 weeks. You cannot control your cough with medicine, and you are losing sleep. You have pain that gets worse or is not helped with medicine. You lose weight for no clear reason. You have night sweats. Get help right away if: You cough up blood. You have trouble breathing. Your heart is beating very fast. These symptoms may be an emergency. Get help right away. Call 911. Do not wait to see if the symptoms will go away. Do not drive yourself to the hospital. This information is not intended to replace advice given to you by your health care provider. Make sure you discuss any questions you have with your health care provider. Document Revised: 04/17/2022 Document Reviewed:  04/17/2022 Elsevier Patient Education  2024 Elsevier Inc.   If you have been instructed to have an in-person evaluation today at a local Urgent Care facility, please use the link below. It will take you to a list of all of our available [facility], including address, phone number and hours of operation. Please do not delay care.  [facility]  If you or a family member do not have a primary care provider, use the link below to schedule a visit and establish care. When you choose a [facility] primary care physician or advanced practice provider, you gain a long-term partner in health. Find a Primary Care Provider  Learn more about [facility]'s in-office and virtual care options: [facility] - Get Care Now

## 2023-10-12 ENCOUNTER — Ambulatory Visit: Payer: Self-pay | Admitting: Neurology

## 2023-10-12 ENCOUNTER — Encounter: Payer: Self-pay | Admitting: Neurology

## 2023-10-12 VITALS — BP 138/64 | HR 69 | Ht 65.0 in | Wt 175.0 lb

## 2023-10-12 DIAGNOSIS — G718 Other primary disorders of muscles: Secondary | ICD-10-CM | POA: Diagnosis not present

## 2023-10-12 NOTE — Progress Notes (Signed)
Follow-up Visit   Date: 10/12/2023    Gwendolyn [name] MRN: [id number] DOB: 03-Mar-1941    Gwendolyn [name] is a 83 y.o. left-handed Caucasian female with  parkinson's disease, hypertension and hyperlipidemia returning to the clinic for follow-up of proximal leg weakness.  The patient was accompanied to the clinic by self.  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  Myopathy is less likely given normal CK and no findings of myopathy on EMG.  EMG showed diffuse neurogenic changes involving the hip muscles without active denervation. MRI of the hip shows severe muscle atrophy of the iliopsoas, gluteus medius, and gluteus minimus along with paraspinal muscle atrophy.  There is no evidence of hyperreflexia or increased tone to suggest UMN pathology.  We have discussed additional testing such as CSF testing or muscle biopsy.  Patient reports that symptoms are stable without worsening, so prefers to monitor.  Return to clinic in 6 months, or sooner as needed.  --------------------------------------------- History of present illness: In 2022, she recalls noticing that she began walking with a limp.  She has low back pain sometimes at the end of the day.  She starting using a cane around the same time.  She had left knee replacement in 2023 and continued to walk with a limp.  She is very active and continues to go to balance class and water exercise.  She noticed that if she sits too long, she has pressure in the low back.  She has weakness in the right leg and has some difficulty with stairs and has to manually lift her right leg to get it into her car.  She denies weakness in the right leg or arms.  No problems with speech/swallow.  No numbness/tingling of the arms or legs.     She is retired Engineer, civil (consulting) from the surgical center.  She worked full-time until she was 36. She was very independent and kept up with her yard work until 2 years ago when her right leg  started to get weak.  Fortunately, she has no falls except a mechanical one when she lost balance while trying to push a stake into the ground.     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 MRI lumbar spine showed left foraminal stenosis at L2-3 and L3-4.  Per my review, there is atrophy of the paraspinal muscles.  EMG shows neurogenic changes involving rectus femoris, adductor longus, iliopsoas, and gluteus medius muscles.  No evidence of myopathy or active denervation.    UPDATE 10/12/2023:  She is here for follow-up.  She reports no change with leg weakness.  Upon standing, she sometimes has sensation that her right foot turns in and her feet can get caught.  Fortunately, she has not had any falls.  She uses a cane which helps her walking.  No new weakness.  Medications:  Current Outpatient Medications on File Prior to Visit  Medication Sig Dispense Refill   ALPRAZolam (XANAX) 0.5 MG tablet TAKE 1/2 TO 1 (ONE-HALF TO ONE) TABLET BY MOUTH THREE TIMES DAILY AS NEEDED FOR ANXIETY 90 tablet 0   ascorbic acid (VITAMIN C) 500 MG tablet Take 500 mg by mouth daily.     carbidopa-levodopa (SINEMET IR) 25-100 MG tablet TAKE 1 TABLET BY MOUTH 3 TIMES A DAY **TAKE AT 7AM, 11AM, AND 4PM** 270 tablet 0   CHELATED MAGNESIUM PO Take 250 mg by mouth 2 (two) times daily.  cholecalciferol (VITAMIN D3) 25 MCG (1000 UNIT) tablet Take 1,000 Units by mouth in the morning and at bedtime.     CRANBERRY PO Take by mouth daily.     cyanocobalamin (VITAMIN B12) 1000 MCG tablet Take 1,000 mcg by mouth daily.     fexofenadine (ALLEGRA) 180 MG tablet Take 180 mg by mouth daily as needed for allergies or rhinitis.     furosemide (LASIX) 40 MG tablet Take 1 tablet (40 mg total) by mouth as needed for edema. 30 tablet 3   losartan-hydrochlorothiazide (HYZAAR) 50-12.5 MG tablet TAKE ONE TABLET BY MOUTH DAILY FOR BLOOD PRESSURE 90 tablet 3   Omega-3 Fatty  Acids (FISH OIL) 1000 MG CAPS Take 1,000 mg by mouth 2 (two) times daily.     pregabalin (LYRICA) 150 MG capsule Take  1 capsule  at Bedtime  for Sleep & Chronic Pain                                   /               TAKE                                   BY                              MOUTH 360 capsule 0   rosuvastatin (CRESTOR) 20 MG tablet Take 1 tablet MWF for Cholesterol 36 tablet 3   timolol (TIMOPTIC) 0.5 % ophthalmic solution Place 1 drop into both eyes every morning.      zinc gluconate 50 MG tablet Take 50 mg by mouth daily.     No current facility-administered medications on file prior to visit.    Allergies:  Allergies  Allergen Reactions   Oxycodone-Acetaminophen Other (See Comments)    panic attack   Ibuprofen Hives   Macrodantin [Nitrofurantoin Macrocrystal] Rash    Vital Signs:  BP 138/64   Pulse 69   Ht 5\' 5"  (1.651 m)   Wt 175 lb (79.4 kg)   SpO2 98%   BMI 29.12 kg/m    Neurological Exam: MENTAL STATUS including orientation to time, place, person, recent and remote memory, attention span and concentration, language, and fund of knowledge is normal.  Speech is not dysarthric.  CRANIAL NERVES:  No visual field defects.  Pupils equal round and reactive to light.  Normal conjugate, extra-ocular eye movements in all directions of gaze.  No ptosis.  Face is symmetric. Palate elevates symmetrically.  Tongue is midline.  MOTOR:  Motor strength is 5/5 in the arms. Mild quadriceps atrophy bilaterally, no fasciculations or abnormal movements.  No pronator drift.  Tone is normal.    Lower Extremity:  Right   Left  Hip flexors  4/5    5-/5   Hip extensors  4/5    5/5   Adductor 4/5   5/5  Abductor 4/5   5/5  Knee flexors  5-/5    5/5   Knee extensors  5/5    5/5   Dorsiflexors  5/5    5/5   Plantarflexors  5/5    5/5   Toe extensors  5/5    5/5   Toe flexors  5/5    5/5   Tone (Ashworth scale)  0   0    MSRs:                                              Right         Left brachioradialis 2+   2+  biceps 2+   2+  triceps 2+   2+  patellar 0   0  ankle jerk 0   0  Hoffman no   no  plantar response down   down    SENSORY:  Normal and symmetric perception of vibration   COORDINATION/GAIT: Normal finger-to- nose-finger.  Reduced amplitude of finger tapping on the left.   Trendelenburg gait on the right, assisted with cane, stable.   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  Correlate clinically.  There is no evidence of a large fiber sensorimotor polyneuropathy.    MRI lumbar spine wo contrast 03/17/2023: 1. 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 2. 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 3. Left lateral recess narrowing at L1-2 and L2-3, which could cause left L2 and L3 radiculopathy.   MRI pelvis wo contrast 02/26/2023: 1. No acute osseous injury of the pelvis. 2. 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 No periarticular fluid collection or osteolysis. 3. Mild bilateral greater trochanteric bursitis. 4. Severe atrophy of the right iliopsoas muscle. 5. Severe atrophy of the right gluteus minimus and medius muscles. 6. Lower lumbar spine spondylosis partially visualized as described above. If there is further clinical concern, recommend an MRI of the lumbar spine.   Lab Results  Component Value Date   CKTOTAL 132 03/05/2023     Thank you for allowing me to participate in patient's care.  If I can answer any additional questions, I would be pleased to do so.    Sincerely,    Jearldean Gutt K. Allena Katz, DO

## 2023-10-25 ENCOUNTER — Other Ambulatory Visit: Payer: Self-pay

## 2023-10-25 DIAGNOSIS — E782 Mixed hyperlipidemia: Secondary | ICD-10-CM

## 2023-10-25 MED ORDER — ROSUVASTATIN CALCIUM 20 MG PO TABS
ORAL_TABLET | ORAL | 1 refills | Status: DC
Start: 2023-10-25 — End: 2023-11-08

## 2023-11-08 ENCOUNTER — Other Ambulatory Visit: Payer: Self-pay

## 2023-11-08 DIAGNOSIS — E782 Mixed hyperlipidemia: Secondary | ICD-10-CM

## 2023-11-08 MED ORDER — ROSUVASTATIN CALCIUM 20 MG PO TABS
ORAL_TABLET | ORAL | 1 refills | Status: AC
Start: 2023-11-08 — End: ?

## 2023-11-24 ENCOUNTER — Ambulatory Visit: Payer: Medicare HMO | Admitting: Nurse Practitioner

## 2023-12-16 DIAGNOSIS — G8929 Other chronic pain: Secondary | ICD-10-CM | POA: Diagnosis not present

## 2023-12-16 DIAGNOSIS — J45909 Unspecified asthma, uncomplicated: Secondary | ICD-10-CM | POA: Diagnosis not present

## 2023-12-16 DIAGNOSIS — Z7982 Long term (current) use of aspirin: Secondary | ICD-10-CM | POA: Diagnosis not present

## 2023-12-16 DIAGNOSIS — H409 Unspecified glaucoma: Secondary | ICD-10-CM | POA: Diagnosis not present

## 2023-12-16 DIAGNOSIS — E785 Hyperlipidemia, unspecified: Secondary | ICD-10-CM | POA: Diagnosis not present

## 2023-12-16 DIAGNOSIS — E663 Overweight: Secondary | ICD-10-CM | POA: Diagnosis not present

## 2023-12-16 DIAGNOSIS — I10 Essential (primary) hypertension: Secondary | ICD-10-CM | POA: Diagnosis not present

## 2023-12-22 ENCOUNTER — Ambulatory Visit (INDEPENDENT_AMBULATORY_CARE_PROVIDER_SITE_OTHER): Payer: HMO | Admitting: Internal Medicine

## 2023-12-22 ENCOUNTER — Encounter: Payer: Self-pay | Admitting: Internal Medicine

## 2023-12-22 VITALS — BP 128/78 | HR 48 | Temp 98.3°F | Ht 62.25 in | Wt 176.5 lb

## 2023-12-22 DIAGNOSIS — G4700 Insomnia, unspecified: Secondary | ICD-10-CM

## 2023-12-22 DIAGNOSIS — E782 Mixed hyperlipidemia: Secondary | ICD-10-CM | POA: Diagnosis not present

## 2023-12-22 DIAGNOSIS — I10 Essential (primary) hypertension: Secondary | ICD-10-CM | POA: Diagnosis not present

## 2023-12-22 DIAGNOSIS — G20A1 Parkinson's disease without dyskinesia, without mention of fluctuations: Secondary | ICD-10-CM | POA: Diagnosis not present

## 2023-12-22 DIAGNOSIS — G90519 Complex regional pain syndrome I of unspecified upper limb: Secondary | ICD-10-CM

## 2023-12-22 DIAGNOSIS — E559 Vitamin D deficiency, unspecified: Secondary | ICD-10-CM | POA: Diagnosis not present

## 2023-12-22 DIAGNOSIS — F411 Generalized anxiety disorder: Secondary | ICD-10-CM | POA: Diagnosis not present

## 2023-12-22 DIAGNOSIS — E538 Deficiency of other specified B group vitamins: Secondary | ICD-10-CM

## 2023-12-22 MED ORDER — PREGABALIN 150 MG PO CAPS
ORAL_CAPSULE | ORAL | 0 refills | Status: DC
Start: 2023-12-22 — End: 2024-03-20

## 2023-12-22 MED ORDER — ALPRAZOLAM 0.5 MG PO TABS
0.5000 mg | ORAL_TABLET | Freq: Every evening | ORAL | 0 refills | Status: AC | PRN
Start: 2023-12-22 — End: ?

## 2023-12-22 NOTE — Progress Notes (Signed)
 New Patient Office Visit     CC/Reason for Visit: Establish care, discuss chronic medical conditions, medication refills Previous PCP: Vangie Genet, MD Last Visit: December/2024  HPI: Gwendolyn [name] is a 83 y.o. female who is coming in today for the above mentioned reasons. Past Medical History is significant for: Parkinson's disease followed by neurology, hypertension, hyperlipidemia, glaucoma, vitamin D  and B12 deficiencies, generalized anxiety disorder.  She uses alprazolam  as needed for sleep and Lyrica  for neuropathy.  No acute concerns today.   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    Vitamin D  deficiency     Past Surgical History:  Procedure Laterality Date   COLONOSCOPY     DILATION AND CURETTAGE OF UTERUS     age 55    right elbow   surgery  March 26, 2002   right hand ulner nerve surgery  March 26, 2002   I and d done right hand/wrist   right shoulder humerus fx  july 2003   surgery done   right total hip arthroplasty  2008   right total knee arthroplasty  sept 2012   right wrist plating  2004   TONSILLECTOMY  age 6   TOTAL HIP ARTHROPLASTY  10/22/2011   Procedure: TOTAL HIP ARTHROPLASTY ANTERIOR APPROACH;  Surgeon: Bevin Bucks, MD;  Location: [facility];  Service: Orthopedics;  Laterality: Left;  Left Total Hip Arthroplasty,  Anterior Approach    TOTAL KNEE ARTHROPLASTY Left 04/28/2022   Procedure: TOTAL KNEE ARTHROPLASTY;  Surgeon: Claiborne Crew, MD;  Location: [facility];  Service: Orthopedics;   Laterality: Left;    Social History:  reports that she has never smoked. She has never used smokeless tobacco. She reports current alcohol use. She reports that she does not use drugs.  Allergies: Allergies  Allergen Reactions   Oxycodone-Acetaminophen  Other (See Comments)    panic attack   Ibuprofen Hives   Macrodantin [Nitrofurantoin Macrocrystal] Rash    Family History:  Family History  Problem Relation Age of Onset   Hypertension Mother    Diabetes Mother    Heart disease Mother        enlarged heart   Atrial fibrillation Mother    Congestive Heart Failure Mother    Diabetes Father    Heart disease Father    Heart attack Father 34   Tremor Father    Atrial fibrillation Brother    Atrial fibrillation Brother    Breast cancer Maternal Grandmother        early 75's   Tremor Paternal Grandmother    Breast cancer Maternal Aunt        early 90's   Breast cancer Paternal Aunt        84's   Colon cancer Neg Hx    Esophageal cancer Neg Hx    Rectal cancer Neg Hx    Stomach cancer Neg Hx    Pancreatic cancer Neg Hx  Current Outpatient Medications:    ascorbic acid (VITAMIN C) 500 MG tablet, Take 500 mg by mouth daily., Disp: , Rfl:    carbidopa -levodopa  (SINEMET  IR) 25-100 MG tablet, TAKE 1 TABLET BY MOUTH 3 TIMES A DAY **TAKE AT 7AM, 11AM, AND 4PM**, Disp: 270 tablet, Rfl: 0   CHELATED MAGNESIUM PO, Take 250 mg by mouth 2 (two) times daily., Disp: , Rfl:    cholecalciferol  (VITAMIN D3) 25 MCG (1000 UNIT) tablet, Take 1,000 Units by mouth in the morning and at bedtime., Disp: , Rfl:    CRANBERRY PO, Take by mouth daily., Disp: , Rfl:    cyanocobalamin  (VITAMIN B12) 1000 MCG tablet, Take 1,000 mcg by mouth daily., Disp: , Rfl:    fexofenadine (ALLEGRA) 180 MG tablet, Take 180 mg by mouth daily as needed for allergies or rhinitis., Disp: , Rfl:    furosemide  (LASIX ) 40 MG tablet, Take 1 tablet (40 mg total) by mouth as needed for edema., Disp: 30 tablet, Rfl: 3    losartan -hydrochlorothiazide  (HYZAAR) 50-12.5 MG tablet, TAKE ONE TABLET BY MOUTH DAILY FOR BLOOD PRESSURE, Disp: 90 tablet, Rfl: 3   Naproxen Sodium (ALEVE PO), Take by mouth every other day., Disp: , Rfl:    Omega-3 Fatty Acids (FISH OIL) 1000 MG CAPS, Take 1,000 mg by mouth 2 (two) times daily., Disp: , Rfl:    rosuvastatin  (CRESTOR ) 20 MG tablet, Take 1 tablet MWF for Cholesterol, Disp: 36 tablet, Rfl: 1   timolol  (TIMOPTIC ) 0.5 % ophthalmic solution, Place 1 drop into both eyes every morning. , Disp: , Rfl:    ALPRAZolam  (XANAX ) 0.5 MG tablet, Take 1 tablet (0.5 mg total) by mouth at bedtime as needed for anxiety., Disp: 30 tablet, Rfl: 0   pregabalin  (LYRICA ) 150 MG capsule, Take  1 capsule  at Bedtime  for Sleep & Chronic Pain                                   /               TAKE                                   BY                              MOUTH, Disp: 90 capsule, Rfl: 0  Review of Systems:  Negative except as indicated in HPI.   Physical Exam: Vitals:   12/22/23 1326 12/22/23 1333 12/22/23 1402  BP: (!) 140/70 (!) 140/70 128/78  Pulse: (!) 48    Temp: 98.3 F (36.8 C)    TempSrc: Oral    SpO2: 94%    Weight: 176 lb 8 oz (80.1 kg)    Height: 5' 2.25" (1.581 m)     Body mass index is 32.02 kg/m.  Physical Exam Vitals reviewed.  Constitutional:      Appearance: Normal appearance. She is obese.  HENT:     Head: Normocephalic and atraumatic.  Eyes:     Conjunctiva/sclera: Conjunctivae normal.  Cardiovascular:     Rate and Rhythm: Normal rate and regular rhythm.  Pulmonary:     Effort: Pulmonary effort is normal.     Breath sounds: Normal breath sounds.  Skin:    General: Skin is warm and dry.  Neurological:     General: No focal deficit present.     Mental Status: She is alert and oriented to person, place, and time.  Psychiatric:        Mood and Affect: Mood normal.        Behavior: Behavior normal.        Thought Content: Thought content normal.         Judgment: Judgment normal.       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  Dispense: 30 tablet; Refill: 0   - Continue follow-up with neurology. - Refill Lyrica  and alprazolam . - Lab work from December reviewed and mostly within normal limits.  Time spent: 46 minutes reviewing chart, interviewing and examining patient and formulating plan of care.     Marguerita Shih, MD [facility]

## 2023-12-27 ENCOUNTER — Other Ambulatory Visit: Payer: Self-pay | Admitting: Neurology

## 2023-12-27 DIAGNOSIS — G20A1 Parkinson's disease without dyskinesia, without mention of fluctuations: Secondary | ICD-10-CM

## 2024-01-03 NOTE — Progress Notes (Unsigned)
 Assessment/Plan:   1.  Parkinsons Disease  -DaTscan  was abnormal November 25, 2021 with decreased radiotracer activity bilaterally, right greater than left.  -she will continue carbidopa /levodopa  25/100, 1 tablet 3 times per day.  2.  B12 deficiency  -Now on oral supplementation.  3.  Severe atrophy - R iliopsoas, R glut medius and minimus  -pt thinks sx's x close to 3 years with balance/ambulation issues  -EMG without evidence of myopathy.EMG demonstrated neurogenic changes involving rectus femoris, adductor longus, iliopsoas and gluteus medius muscles.  Dr. Lydia Sams and patient discussed CSF testing/muscle biopsy testing, but both agreed to hold off on those things as yield was low.  She is following every 6 months with Dr. Lydia Sams. 4.  Mild dysphagia  -discussed mbe but decided to hold for now  -she is going to cut foods smaller bites smaller with steaks/dry foods   Subjective:   Gwendolyn [name] was seen today in follow up for Parkinsons disease.  My previous records were reviewed prior to todays visit as well as outside records available to me.   She remains on levodopa  and is doing well with that.  She saw Dr. Lydia Sams February 11.  They opted to just hold on further testing.  Current prescribed movement disorder medications: Carbidopa /levodopa  25/100, 1 tablet 3 times per day (7-8am/11am/4pm)   ALLERGIES:   Allergies  Allergen Reactions   Oxycodone-Acetaminophen  Other (See Comments)    panic attack   Ibuprofen Hives   Macrodantin [Nitrofurantoin Macrocrystal] Rash    CURRENT MEDICATIONS:  No outpatient medications have been marked as taking for the 01/04/24 encounter (Appointment) with Ryleah Miramontes, [place], DO.     Objective:   PHYSICAL EXAMINATION:    VITALS:   There were no vitals filed for this visit.   GEN:  The patient appears stated age and is in NAD. HEENT:  Normocephalic, AT.   The mucous membranes are moist. The superficial temporal arteries are without ropiness  or tenderness. CV:  RRR Lungs:  CTAB Neck/HEME:  There are no carotid bruits bilaterally.  Neurological examination:  Orientation: The patient is alert and oriented x3. Cranial nerves: There is good facial symmetry without facial hypomimia. The speech is fluent and clear. Soft palate rises symmetrically and there is no tongue deviation. Hearing is intact to conversational tone. Sensation: Sensation is intact to light touch throughout Motor: Strength is at least antigravity x4.  Movement examination: Tone: There is nl tone b/l Abnormal movements: min increased tone in the RUE Coordination:  There is no decremation, with any form of RAMS, including alternating supination and pronation of the forearm, hand opening and closing, finger taps, heel taps and toe taps. Gait and Station: The patient pushes off to arise.  Walks with cane.  She is antalgic.  She continues to have evidence of gluteal muscle weakness on the right.  No shuffling.    I have reviewed and interpreted the following labs independently    Chemistry      Component Value Date/Time   NA 139 08/02/2023 1103   K 4.3 08/02/2023 1103   CL 103 08/02/2023 1103   CO2 29 08/02/2023 1103   BUN 31 (H) 08/02/2023 1103   CREATININE 0.78 08/02/2023 1103      Component Value Date/Time   CALCIUM  9.1 08/02/2023 1103   ALKPHOS 87 03/31/2017 0927   AST 22 08/02/2023 1103   ALT 6 08/02/2023 1103   BILITOT 0.4 08/02/2023 1103       Lab Results  Component Value Date   WBC 5.6 08/02/2023   HGB 12.8 08/02/2023   HCT 39.0 08/02/2023   MCV 92.4 08/02/2023   PLT 259 08/02/2023    Lab Results  Component Value Date   TSH 1.02 01/06/2023   Total time spent on today's visit was *** minutes, including both face-to-face time and nonface-to-face time.  Time included that spent on review of records (prior notes available to me/labs/imaging if pertinent), discussing treatment and goals, answering patient's questions and coordinating care.     Cc:  Zilphia Hilt, Charyl Coppersmith, MD

## 2024-01-04 ENCOUNTER — Ambulatory Visit: Payer: Medicare HMO | Admitting: Neurology

## 2024-01-04 VITALS — BP 136/80 | HR 74 | Ht 65.0 in | Wt 176.8 lb

## 2024-01-04 DIAGNOSIS — G20A1 Parkinson's disease without dyskinesia, without mention of fluctuations: Secondary | ICD-10-CM

## 2024-01-04 DIAGNOSIS — G718 Other primary disorders of muscles: Secondary | ICD-10-CM | POA: Diagnosis not present

## 2024-01-04 NOTE — Patient Instructions (Signed)

## 2024-01-06 ENCOUNTER — Ambulatory Visit: Payer: Medicare HMO | Admitting: Neurology

## 2024-01-11 ENCOUNTER — Other Ambulatory Visit: Payer: Self-pay | Admitting: Internal Medicine

## 2024-01-11 ENCOUNTER — Ambulatory Visit: Payer: Self-pay | Admitting: Neurology

## 2024-01-11 DIAGNOSIS — Z1231 Encounter for screening mammogram for malignant neoplasm of breast: Secondary | ICD-10-CM

## 2024-01-13 ENCOUNTER — Ambulatory Visit
Admission: RE | Admit: 2024-01-13 | Discharge: 2024-01-13 | Disposition: A | Discharge status: Home / Self Care | Source: Ambulatory Visit | Attending: Internal Medicine | Admitting: Internal Medicine

## 2024-01-13 DIAGNOSIS — Z1231 Encounter for screening mammogram for malignant neoplasm of breast: Secondary | ICD-10-CM

## 2024-01-19 ENCOUNTER — Encounter: Payer: Medicare HMO | Admitting: Internal Medicine

## 2024-01-31 DIAGNOSIS — H401131 Primary open-angle glaucoma, bilateral, mild stage: Secondary | ICD-10-CM | POA: Diagnosis not present

## 2024-01-31 DIAGNOSIS — Z961 Presence of intraocular lens: Secondary | ICD-10-CM | POA: Diagnosis not present

## 2024-01-31 DIAGNOSIS — H524 Presbyopia: Secondary | ICD-10-CM | POA: Diagnosis not present

## 2024-03-01 ENCOUNTER — Encounter: Payer: Medicare HMO | Admitting: Internal Medicine

## 2024-03-02 IMAGING — MG MM DIGITAL SCREENING BILAT W/ TOMO AND CAD
6 of 10 series · 6 of 30 positions shown · non-contrast
Comparison: Previous exam(s).

CLINICAL DATA: Screening.

EXAM:
DIGITAL SCREENING BILATERAL MAMMOGRAM WITH TOMOSYNTHESIS AND CAD
TECHNIQUE: Bilateral screening digital craniocaudal and mediolateral oblique
mammograms were obtained. Bilateral screening digital breast
tomosynthesis was performed. The images were evaluated with
computer-aided detection.

[R MLO synth-2D (1 of 2)]
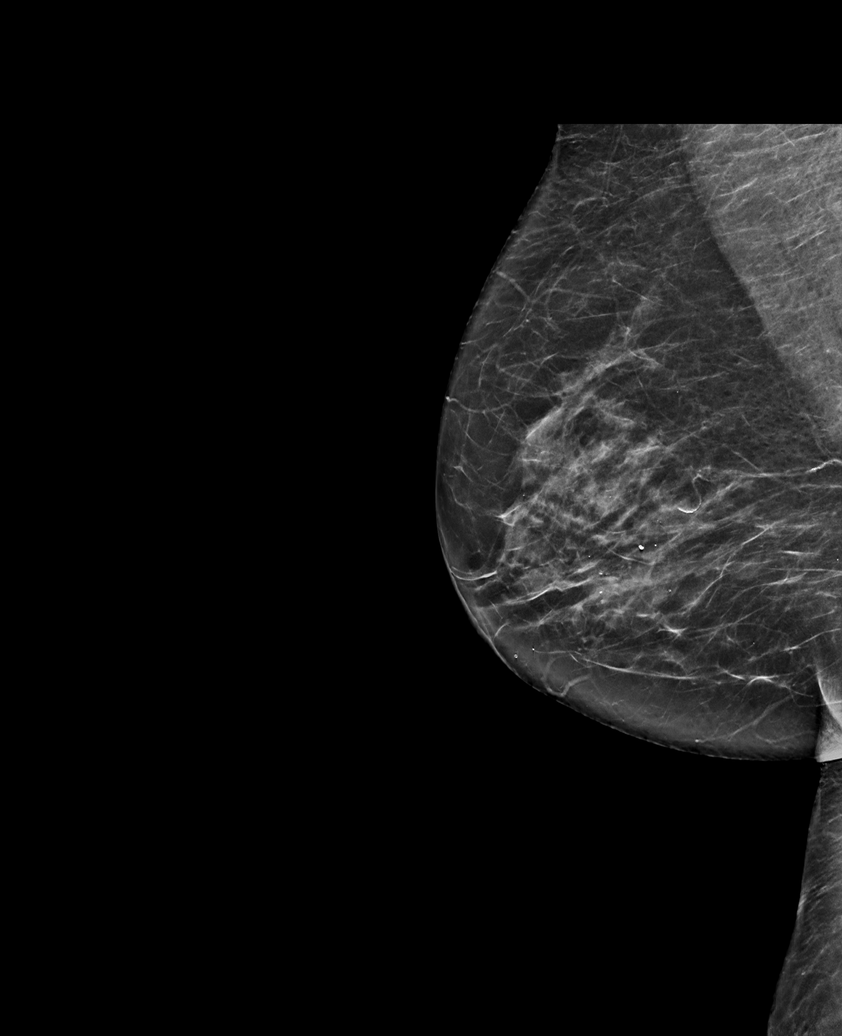

[R CC synth-2D]
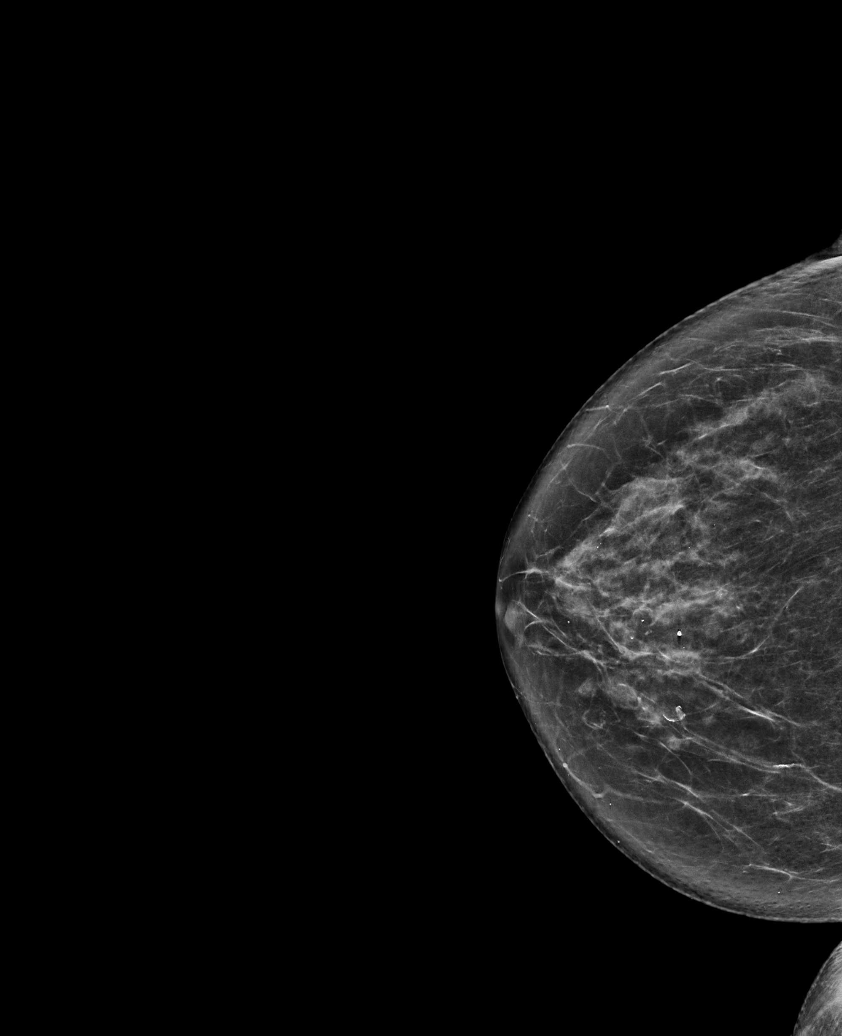

[L CC synth-2D]
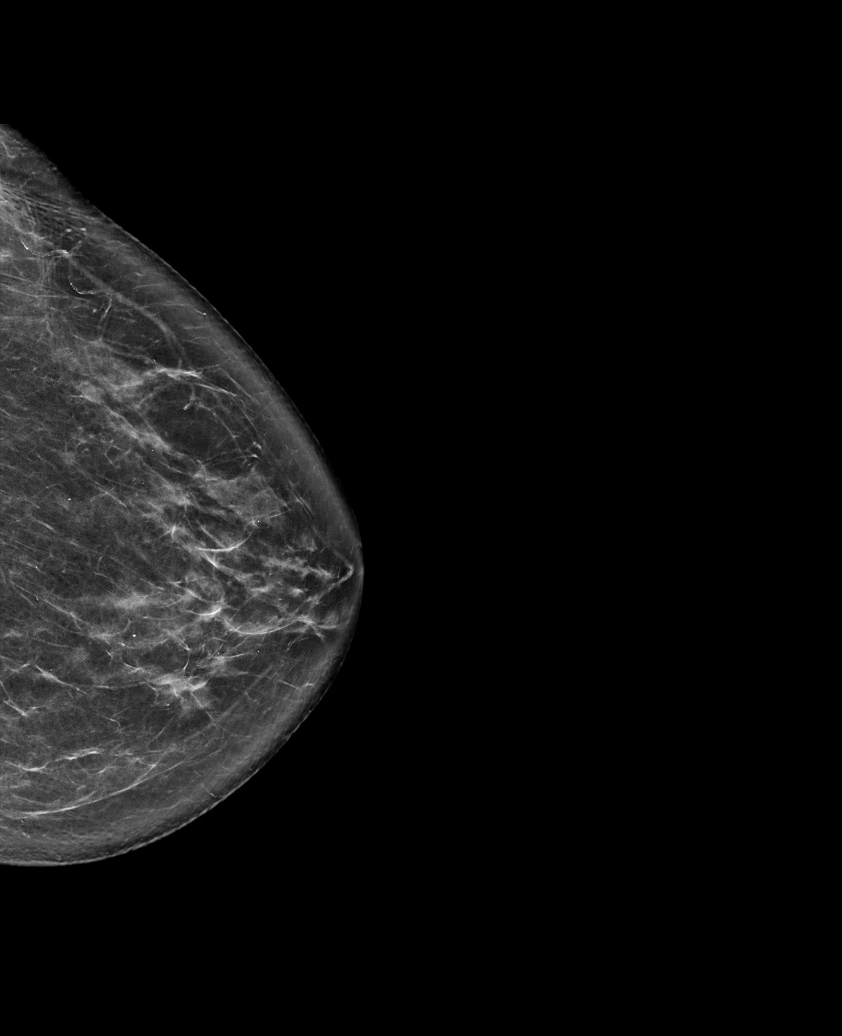

[R MLO synth-2D (2 of 2)]
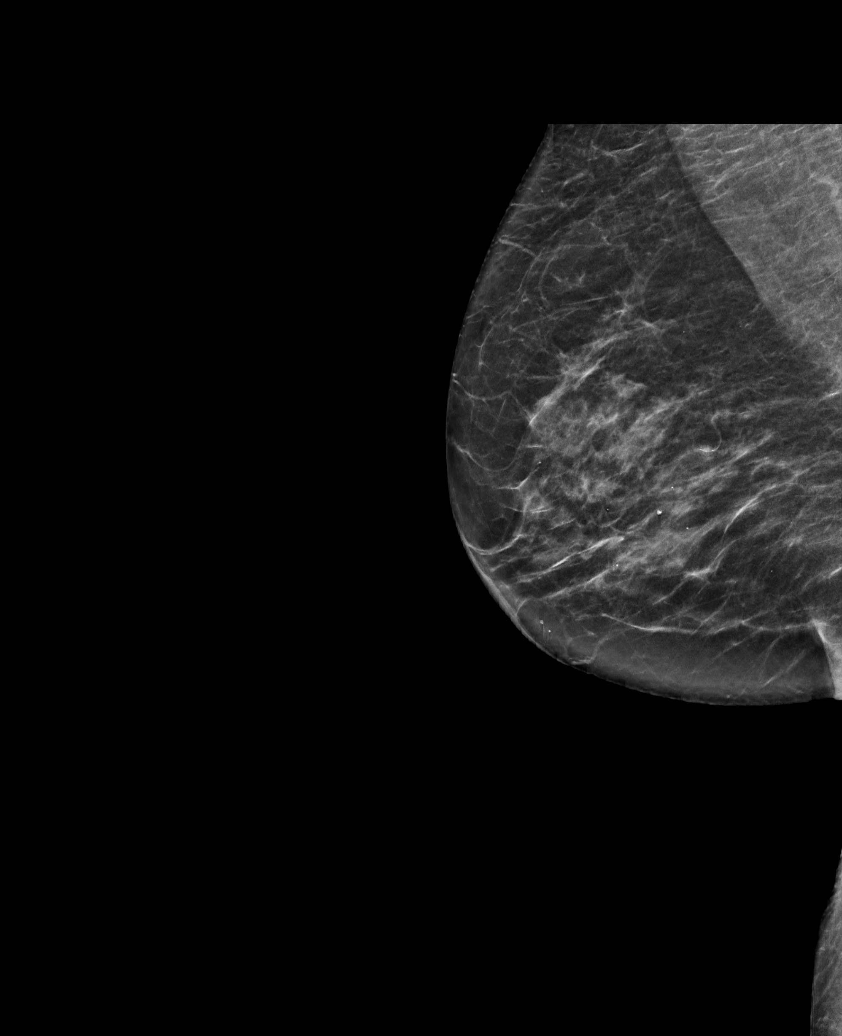

[L MLO synth-2D]
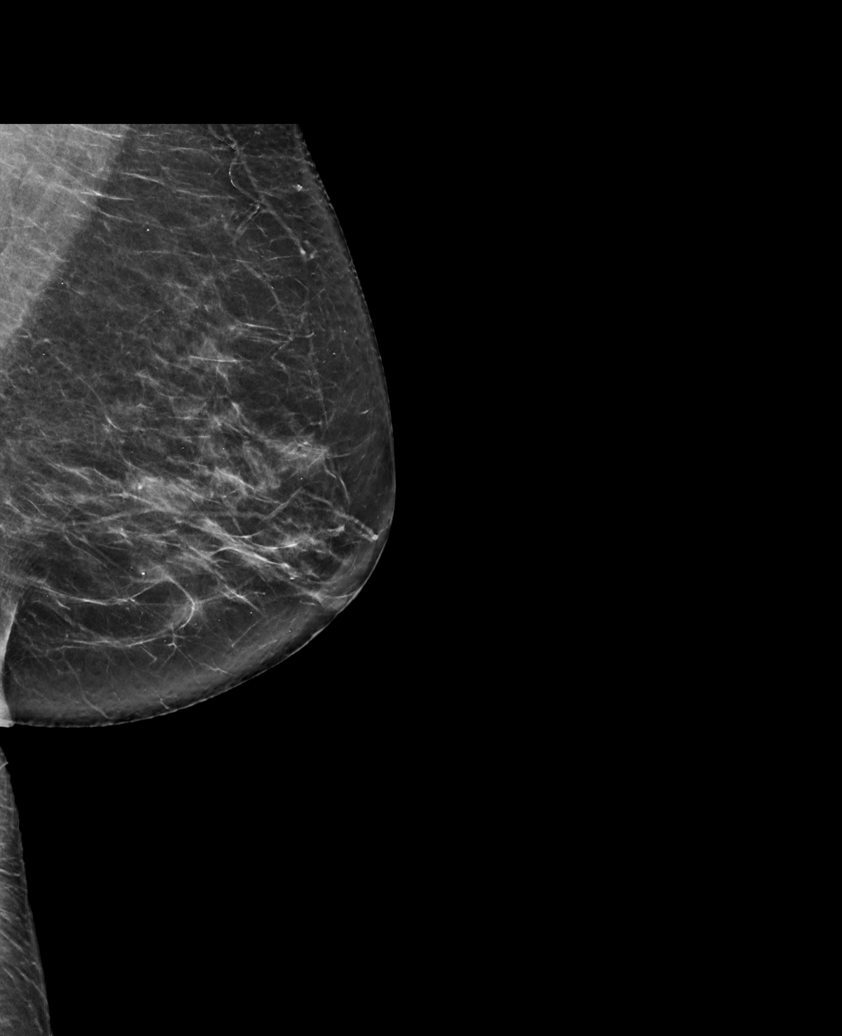

[L MLO tomo · tomo slice 37/74.0]
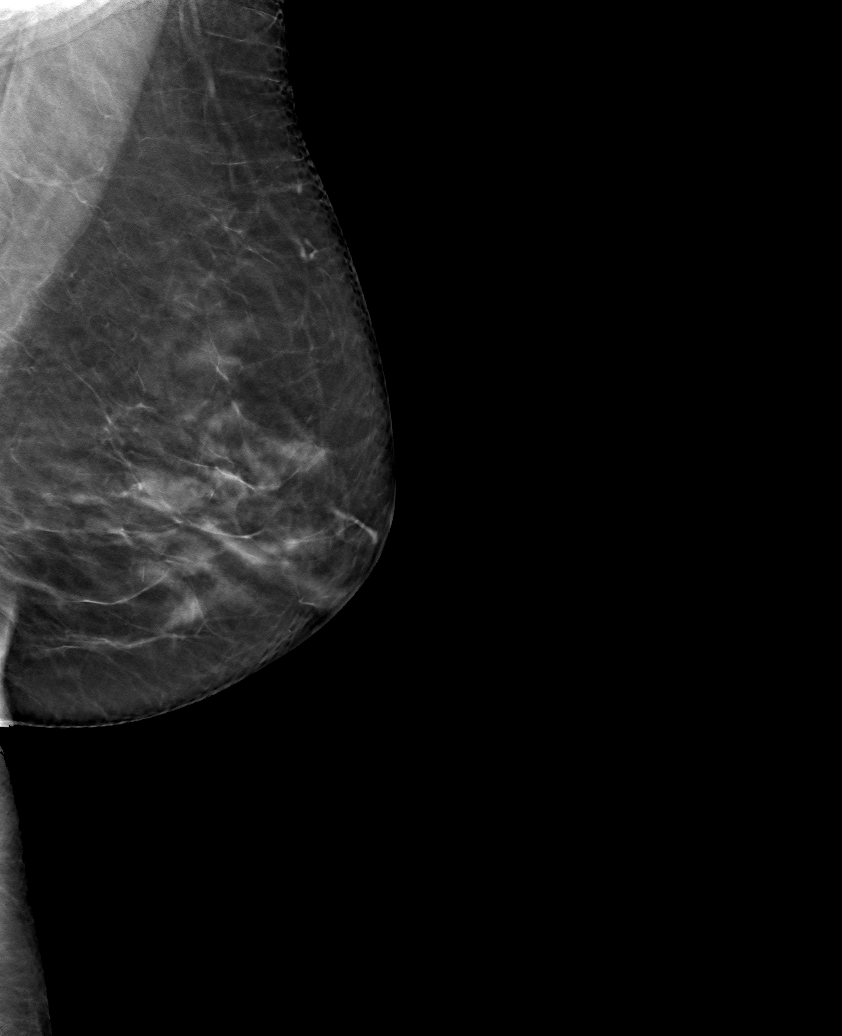

[6 of 30 positions shown; findings below may reference images not displayed]

ACR Breast Density Category c: The breast tissue is heterogeneously
dense, which may obscure small masses.
FINDINGS: There are no findings suspicious for malignancy.
IMPRESSION: No mammographic evidence of malignancy. A result letter of this
screening mammogram will be mailed directly to the patient.

RECOMMENDATION:
Screening mammogram in one year. (Code:Q3-W-BC3)

BI-RADS CATEGORY  1: Negative.

## 2024-03-18 ENCOUNTER — Other Ambulatory Visit: Payer: Self-pay | Admitting: Internal Medicine

## 2024-03-18 DIAGNOSIS — G90519 Complex regional pain syndrome I of unspecified upper limb: Secondary | ICD-10-CM

## 2024-03-24 ENCOUNTER — Other Ambulatory Visit: Payer: Self-pay | Admitting: Neurology

## 2024-03-24 DIAGNOSIS — G20A1 Parkinson's disease without dyskinesia, without mention of fluctuations: Secondary | ICD-10-CM

## 2024-04-10 ENCOUNTER — Ambulatory Visit: Payer: HMO | Admitting: Neurology

## 2024-04-10 ENCOUNTER — Encounter: Payer: Self-pay | Admitting: Neurology

## 2024-04-10 VITALS — BP 136/71 | HR 70 | Ht 65.0 in | Wt 176.0 lb

## 2024-04-10 DIAGNOSIS — G718 Other primary disorders of muscles: Secondary | ICD-10-CM | POA: Diagnosis not present

## 2024-04-10 DIAGNOSIS — M545 Low back pain, unspecified: Secondary | ICD-10-CM

## 2024-04-10 DIAGNOSIS — G8929 Other chronic pain: Secondary | ICD-10-CM | POA: Diagnosis not present

## 2024-04-10 MED ORDER — TIZANIDINE HCL 2 MG PO CAPS: 2.0000 mg | ORAL_CAPSULE | Freq: Every evening | ORAL | 1 refills | Status: AC | PRN

## 2024-04-10 NOTE — Progress Notes (Signed)
 Follow-up Visit   Date: 04/10/2024    Gwendolyn [name] MRN: [id number] DOB: Feb 04, 1941    Gwendolyn [name] is a 83 y.o. left-handed Caucasian female with  parkinson's disease (followed by Dr. Evonnie), hypertension and hyperlipidemia returning to the clinic for follow-up of proximal leg weakness.  The patient was accompanied to the clinic by self.  IMPRESSION/PLAN: Low back pain, most likely lumbar strain.  MRI lumbar spine shows mild spinal canal stenosis and left sided foraminal stenosis at L2-3 and L3-4.  She denies radicular leg pain.    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  Myopathy is less likely given normal CK and no findings of myopathy on EMG.  EMG showed diffuse neurogenic changes involving the hip muscles without active denervation. MRI of the hip shows severe muscle atrophy of the iliopsoas, gluteus medius, and gluteus minimus along with paraspinal muscle atrophy.  There is no evidence of hyperreflexia or increased tone to suggest [facility] pathology. Previously discussed additional testing such as CSF testing or muscle biopsy, and opted to monitor due to stability of symptoms.  Return to clinic in 6 months, or sooner as needed.  --------------------------------------------- History of present illness: In 2022, she recalls noticing that she began walking with a limp.  She has low back pain sometimes at the end of the day.  She starting using a cane around the same time.  She had left knee replacement in 2023 and continued to walk with a limp.  She is very active and continues to go to balance class and water exercise.  She noticed that if she sits too long, she has pressure in the low back.  She has weakness in the right leg and has some difficulty with stairs and has to manually lift her right leg to get it into her car.  She denies weakness in the right leg or arms.   No problems with speech/swallow.  No numbness/tingling of the arms or legs.     She is retired Engineer, civil (consulting) from the surgical center.  She worked full-time until she was 77. She was very independent and kept up with her yard work until 2 years ago when her right leg started to get weak.  Fortunately, she has no falls except a mechanical one when she lost balance while trying to push a stake into the ground.     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 MRI lumbar spine showed left foraminal stenosis at L2-3 and L3-4.  Per my review, there is atrophy of the paraspinal muscles.  EMG shows neurogenic changes involving rectus femoris, adductor longus, iliopsoas, and gluteus medius muscles.  No evidence of myopathy or active denervation.    UPDATE 04/10/2024:  She has low back pain and achy pain on top of her feet which is worse with prolonged activity.  She gets relief with tramadol. She uses a cane for support. 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  There has been no change in her leg weakness.  She continues to feel that at times, her foot will turn inwards.  Fortunately, she has not had any falls.   Medications:  Current Outpatient Medications on File Prior to Visit  Medication Sig Dispense Refill   ALPRAZolam  (XANAX ) 0.5 MG tablet Take 1 tablet (0.5 mg total) by mouth  at bedtime as needed for anxiety. 30 tablet 0   ascorbic acid (VITAMIN C) 500 MG tablet Take 500 mg by mouth daily.     carbidopa -levodopa  (SINEMET  IR) 25-100 MG tablet TAKE 1 TABLET BY MOUTH 3 TIMES A DAY **TAKE AT 7AM, 11AM, AND 4PM** 270 tablet 0   CHELATED MAGNESIUM PO Take 250 mg by mouth 2 (two) times daily.     cholecalciferol  (VITAMIN D3) 25 MCG (1000 UNIT) tablet Take 1,000 Units by mouth in the morning and at bedtime.     CRANBERRY PO Take by mouth daily.     cyanocobalamin  (VITAMIN B12) 1000 MCG tablet Take 1,000 mcg by mouth  daily.     fexofenadine (ALLEGRA) 180 MG tablet Take 180 mg by mouth daily as needed for allergies or rhinitis.     furosemide  (LASIX ) 40 MG tablet Take 1 tablet (40 mg total) by mouth as needed for edema. 30 tablet 3   losartan -hydrochlorothiazide  (HYZAAR) 50-12.5 MG tablet TAKE ONE TABLET BY MOUTH DAILY FOR BLOOD PRESSURE 90 tablet 3   Naproxen Sodium (ALEVE PO) Take by mouth every other day.     pregabalin  (LYRICA ) 150 MG capsule TAKE 1 CAPSULE BY MOUTH AT BEDTIME FOR SLEEP AND CHRONIC PAIN 90 capsule 0   rosuvastatin  (CRESTOR ) 20 MG tablet Take 1 tablet MWF for Cholesterol 36 tablet 1   timolol  (TIMOPTIC ) 0.5 % ophthalmic solution Place 1 drop into both eyes every morning.      Omega-3 Fatty Acids (FISH OIL) 1000 MG CAPS Take 1,000 mg by mouth 2 (two) times daily.     No current facility-administered medications on file prior to visit.    Allergies:  Allergies  Allergen Reactions   Oxycodone-Acetaminophen  Other (See Comments)    panic attack   Ibuprofen Hives   Macrodantin [Nitrofurantoin Macrocrystal] Rash    Vital Signs:  BP 136/71   Pulse 70   Ht 5' 5 (1.651 m)   Wt 176 lb (79.8 kg)   SpO2 97%   BMI 29.29 kg/m    Neurological Exam: MENTAL STATUS including orientation to time, place, person, recent and remote memory, attention span and concentration, language, and fund of knowledge is normal.  Speech is not dysarthric.  CRANIAL NERVES:  No visual field defects.  Pupils equal round and reactive to light.  Normal conjugate, extra-ocular eye movements in all directions of gaze.  No ptosis.  Face is symmetric. Palate elevates symmetrically.  Tongue is midline.  MOTOR:  Motor strength is 5/5 in the arms. Mild quadriceps atrophy bilaterally, no fasciculations or abnormal movements.  No pronator drift.  Tone is normal.    Lower Extremity:  Right   Left  Hip flexors  4+/5    5-/5   Hip extensors  4+/5    5/5   Adductor 4+/5   5/5  Abductor 4+/5   5/5  Knee flexors  5-/5     5/5   Knee extensors  5/5    5/5   Dorsiflexors  5/5    5/5   Plantarflexors  5/5    5/5   Toe extensors  5/5    5/5   Toe flexors  5/5    5/5   Tone (Ashworth scale)  0   0    MSRs:  Right        Left brachioradialis 2+   2+  biceps 2+   2+  triceps 2+   2+  patellar 0   0  ankle jerk 0   0  Hoffman no   no  plantar response down   down    SENSORY:  Normal and symmetric perception of vibration   COORDINATION/GAIT: Normal finger-to- nose-finger.  Reduced amplitude of finger tapping on the left.   Trendelenburg gait on the right, assisted with cane, stable.   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  Correlate clinically.  There is no evidence of a large fiber sensorimotor polyneuropathy.    MRI lumbar spine wo contrast 03/17/2023: 1. 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 2. 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 3. Left lateral recess narrowing at L1-2 and L2-3, which could cause left L2 and L3 radiculopathy.   MRI pelvis wo contrast 02/26/2023: 1. No acute osseous injury of the pelvis. 2. 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 No periarticular fluid collection or osteolysis. 3. Mild bilateral greater trochanteric bursitis. 4. Severe atrophy of the right iliopsoas muscle. 5. Severe atrophy of the right gluteus minimus and medius muscles. 6. Lower lumbar spine spondylosis partially visualized as described above. If there is further clinical concern, recommend an MRI of the lumbar spine.   Lab Results  Component  Value Date   CKTOTAL 132 03/05/2023     Thank you for allowing me to participate in patient's care.  If I can answer any additional questions, I would be pleased to do so.    Sincerely,    Mareon Robinette K. Tobie, DO

## 2024-04-10 NOTE — Patient Instructions (Signed)
 Start PT for low back strengthening  OK to take tizanidine  2mg  at bedtime as needed

## 2024-04-13 ENCOUNTER — Ambulatory Visit: Payer: Medicare HMO | Admitting: Nurse Practitioner

## 2024-04-24 NOTE — Therapy (Unsigned)
 OUTPATIENT PHYSICAL THERAPY THORACOLUMBAR EVALUATION   Patient Name: Gwendolyn [name] MRN: [id number] DOB:06-11-1941, 83 y.o., female Today's Date: 04/26/2024  END OF SESSION:  PT End of Session - 04/26/24 1247     Visit Number 1    Number of Visits 16    Date for PT Re-Evaluation 06/26/24    Authorization Type HTA    PT Start Time 1045    PT Stop Time 1130    PT Time Calculation (min) 45 min    Activity Tolerance Patient tolerated treatment well    Behavior During Therapy [facility] for tasks assessed/performed          Past Medical History:  Diagnosis Date   Allergy    Arthritis    Blood transfusion without reported diagnosis    Cataract    removed both eyes    Cervical radicular pain 02/11/2016   Glaucoma    low pressure glaucoma    Hyperlipidemia    Hypertension    Osteopenia    Parkinson's disease (HCC)    Pelvis fracture (HCC) 03/26/2002   both sides fx   Positive colorectal cancer screening using Cologuard test 04/14/2017   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    Vitamin D  deficiency    Past Surgical History:  Procedure Laterality Date   COLONOSCOPY     DILATION AND CURETTAGE OF UTERUS     age 45    right elbow   surgery  March 26, 2002   right hand ulner nerve surgery  March 26, 2002   I and d done right hand/wrist   right shoulder humerus fx  july 2003   surgery done   right total hip arthroplasty  2008   right total knee arthroplasty  sept 2012   right wrist plating  2004   TONSILLECTOMY  age 50   TOTAL HIP ARTHROPLASTY  10/22/2011   Procedure: TOTAL HIP ARTHROPLASTY ANTERIOR APPROACH;  Surgeon: Donnice JONETTA Car, MD;  Location: [facility];  Service: Orthopedics;  Laterality: Left;  Left Total Hip Arthroplasty,  Anterior Approach    TOTAL KNEE ARTHROPLASTY Left 04/28/2022   Procedure: TOTAL KNEE ARTHROPLASTY;  Surgeon: Car Donnice, MD;  Location: [facility];  Service: Orthopedics;  Laterality: Left;   Patient Active Problem List   Diagnosis Date Noted    S/P total knee arthroplasty, 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   Parkinson's disease (HCC) 11/28/2021   B12 deficiency 05/28/2021   Cervical arthritis 09/27/2017   Open-angle glaucoma 05/14/2016   Generalized anxiety disorder 02/11/2016   Overweight (BMI 25.0-29.9) 04/25/2015   Medication management 02/12/2014   Essential hypertension 08/08/2013   Hyperlipidemia, mixed 08/08/2013   Abnormal glucose 08/08/2013   Vitamin D  deficiency 08/08/2013    PCP: Theophilus Andrews, Tully GRADE, MD   REFERRING PROVIDER: Patel, Donika K, DO  REFERRING DIAG: [phone] (ICD-10-CM) - Chronic bilateral low back pain without sciatica  Rationale for Evaluation and Treatment: Rehabilitation  THERAPY DIAG:  Other low back pain  Muscle weakness (generalized)  Other abnormalities of gait and mobility  ONSET DATE: chronic  SUBJECTIVE:  SUBJECTIVE STATEMENT: In 2022, she recalls noticing that she began walking with a limp.  She has low back pain sometimes at the end of the day.  She starting using a cane around the same time.  She had left knee replacement in 2023 and continued to walk with a limp.  She is very active and continues to go to balance class and water exercise.  She noticed that if she sits too long, she has pressure in the low back.  She has weakness in the right leg and has some difficulty with stairs and has to manually lift her right leg to get it into her car.  She denies weakness in the right leg or arms.  No problems with speech/swallow.  No numbness/tingling of the arms or legs.     She is retired Engineer, civil (consulting) from the surgical center.  She worked full-time until she was 14. She was very independent and kept up with her yard work until 2 years ago when her right leg started to get weak.  Fortunately, she has no falls except a  mechanical one when she lost balance while trying to push a stake into the ground.     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 MRI lumbar spine showed left foraminal stenosis at L2-3 and L3-4.  Per my review, there is atrophy of the paraspinal muscles.  EMG shows neurogenic changes involving rectus femoris, adductor longus, iliopsoas, and gluteus medius muscles.  No evidence of myopathy or active denervation.     UPDATE 04/10/2024:  She has low back pain and achy pain on top of her feet which is worse with prolonged activity.  She gets relief with tramadol. She uses a cane for support. 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  There has been no change in her leg weakness.  She continues to feel that at times, her foot will turn inwards.  Fortunately, she has not had any falls.   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  Myopathy is less likely given normal CK and no findings of myopathy on EMG.  EMG showed diffuse neurogenic changes involving the hip muscles without active denervation. MRI of the hip shows severe muscle atrophy of the iliopsoas, gluteus medius, and gluteus minimus along with paraspinal muscle atrophy.  There is no evidence of hyperreflexia or increased tone to suggest UMN pathology. Previously discussed additional testing such as CSF testing or muscle biopsy, and opted to monitor due to stability of symptoms.  PAIN:  Are you having pain? Yes: NPRS scale: 5/10 Pain location: low back Pain description: ache Aggravating factors: position changes Relieving factors: sitting   PRECAUTIONS: None  RED FLAGS: None   WEIGHT BEARING RESTRICTIONS: No  FALLS:  Has patient fallen in last 6 months? No  OCCUPATION: retired  PLOF: Independent  PATIENT GOALS: To manage my low back pain  NEXT MD VISIT: TBD  OBJECTIVE:   Note: Objective measures were completed at Evaluation unless otherwise noted.  DIAGNOSTIC FINDINGS:  IMPRESSION: 1. 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 2. 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 3. Left lateral recess narrowing at L1-2 and L2-3, which could cause left L2 and L3 radiculopathy.     Electronically Signed   By: Franky Stanford M.D.   On: 03/17/2023 01:53  PATIENT SURVEYS:  Modified Oswestry:   Interpretation of scores: Score Category Description  0-20% Minimal Disability The patient can cope with most living activities. Usually no treatment is indicated apart from advice on lifting, sitting and exercise  21-40% Moderate Disability The patient experiences more pain and difficulty with sitting, lifting and standing. Travel and social life are more difficult and they may be disabled from work. Personal care, sexual activity and sleeping are not grossly affected, and the patient can usually be managed by conservative means  41-60% Severe Disability Pain remains the main problem in this group, but activities of daily living are affected. These patients require a detailed investigation  61-80% Crippled Back pain impinges on all aspects of the patient's life. Positive intervention is required  81-100% Bed-bound  These patients are either bed-bound or exaggerating their symptoms  Bluford FORBES Zoe DELENA Karon DELENA, et al. Surgery versus conservative management of stable thoracolumbar fracture: the PRESTO feasibility RCT. [place]): [employer]; 2021 Nov. [facility] Technology Assessment, No. 25.62.) Appendix 3, Oswestry Disability Index category descriptors. Available from: FindJewel[employer].cz  Minimally Clinically Important Difference (MCID) = 12.8%   MUSCLE LENGTH: Hamstrings: Right 70 deg; Left 70 deg   POSTURE: decreased lumbar lordosis and flexed hip posture, knee  valgus   PALPATION: deferred  LUMBAR ROM:   AROM eval  Flexion 90%  Extension 10%  Right lateral flexion 50%  Left lateral flexion 50%  Right rotation   Left rotation    (Blank rows = not tested)  LOWER EXTREMITY ROM:   WFL throughout  Active  Right eval Left eval  Hip flexion    Hip extension    Hip abduction    Hip adduction    Hip internal rotation    Hip external rotation    Knee flexion    Knee extension    Ankle dorsiflexion    Ankle plantarflexion    Ankle inversion    Ankle eversion     (Blank rows = not tested)  LOWER EXTREMITY MMT:  see 30s chair stand test  MMT Right eval Left eval  Hip flexion    Hip extension    Hip abduction    Hip adduction    Hip internal rotation    Hip external rotation    Knee flexion    Knee extension    Ankle dorsiflexion    Ankle plantarflexion    Ankle inversion    Ankle eversion     (Blank rows = not tested)  LUMBAR SPECIAL TESTS:  Straight leg raise test: Negative and Slump test: Negative  FUNCTIONAL TESTS:  30 seconds chair stand test 5 reps  GAIT: Distance walked: 48ft x2 Assistive device utilized: Single point cane Level of assistance: Complete Independence Comments: slow cadence  TREATMENT:                                                                                                                            [facility] Adult PT Treatment:  DATE: 04/26/24 Eval and HEP Self Care: Additional minutes spent for educating on updated Therapeutic Home Exercise Program as well as comparing current status to condition at start of symptoms. This included exercises focusing on stretching, strengthening, with focus on eccentric aspects. Long term goals include an improvement in range of motion, strength, endurance as well as avoiding reinjury. Patient's frequency would include in 1-2 times a day, 3-5 times a week for a duration of 6-12 weeks. Proper technique shown and  discussed handout in great detail. All questions were discussed and addressed.      PATIENT EDUCATION:  Education details: Discussed eval findings, rehab rationale and POC and patient is in agreement  Person educated: Patient Education method: Explanation and Handouts Education comprehension: verbalized understanding and needs further education  HOME EXERCISE PROGRAM: Access Code: [email or web address] URL: [email or web address] Date: 04/26/2024 Prepared by: Reyes Kohut  Exercises - Supine Bridge with Resistance Band  - 1-2 x daily - 5 x weekly - 3 sets - 10 reps - Hooklying Single Leg Bent Knee Fallouts with Resistance  - 1-2 x daily - 5 x weekly - 3 sets - 10 reps - Static Prone on Elbows  - 1-2 x daily - 5 x weekly - 1 sets - 2 min hold  ASSESSMENT:  CLINICAL IMPRESSION: Patient is a 83 y.o. female who was seen today for physical therapy evaluation and treatment for chronic low back pain and limited mobility due to underlying degenerative changes including stenosis.  Imaging studies show atrophy of lumbopelvic musculature.  BLE ROM and flexibility is functional and no neural tension signs noted.  Patient is a good candidate for OPPT to include aquatic PT, core strengthening and lumbar spine flexibility tasks.  OBJECTIVE IMPAIRMENTS: Abnormal gait, decreased activity tolerance, decreased coordination, decreased mobility, difficulty walking, decreased ROM, decreased strength, impaired perceived functional ability, improper body mechanics, postural dysfunction, and pain.   ACTIVITY LIMITATIONS: carrying, lifting, bending, standing, sleeping, transfers, and bed mobility  PERSONAL FACTORS: Age, Fitness, Past/current experiences, and Time since onset of injury/illness/exacerbation are also affecting patient's functional outcome.   REHAB POTENTIAL: Good  CLINICAL DECISION MAKING: Evolving/moderate complexity  EVALUATION COMPLEXITY: Moderate   GOALS: Goals reviewed with patient?  No  SHORT TERM GOALS: Target date: 05/24/2024  Patient to demonstrate independence in HEP  Baseline: F9MG [facility] Goal status: INITIAL  2.  Assess 2 MWT  Baseline: TBD Goal status: INITIAL  3.  Assess BERG for balance deficits Baseline: TBD Goal status: INITIAL  LONG TERM GOALS: Target date: 06/21/2024  Patient will acknowledge 4/10 pain at least once during episode of care   Baseline: 5/10 Goal status: INITIAL  2.  Patient will score at least 10/50 on ODI to signify clinically meaningful improvement in functional abilities.   Baseline: 16/50 Goal status: INITIAL  3.  Patient will increase 30s chair stand reps from 5 to 8 without arms to demonstrate and improved functional ability with less pain/difficulty as well as reduce fall risk.  Baseline: 5 Goal status: INITIAL  4.  Assess BERG goal as needed Baseline: TBD Goal status: INITIAL  5.  Assess 2 MWT for progress Baseline: TBD Goal status: INITIAL    PLAN:  PT FREQUENCY: 1-2x/week  PT DURATION: 6 weeks  PLANNED INTERVENTIONS: 97110-Therapeutic exercises, 97530- Therapeutic activity, [id number]- Neuromuscular re-education, 97535- Self Care, [id number]- Manual therapy, [id number]- Gait training, [phone]- Aquatic Therapy, [phone] (1-2 muscles), [id number] (3+ muscles)- Dry Needling, Patient/Family education, Balance training, and Stair training.  PLAN FOR NEXT SESSION: HEP review and update, manual techniques  as appropriate, aerobic tasks, ROM and flexibility activities, strengthening and PREs, TPDN, gait and balance training as needed     Reyes CHRISTELLA Kohut, PT 04/26/2024, 1:38 PM

## 2024-04-26 ENCOUNTER — Ambulatory Visit: Attending: Neurology

## 2024-04-26 ENCOUNTER — Other Ambulatory Visit: Payer: Self-pay

## 2024-04-26 DIAGNOSIS — M545 Low back pain, unspecified: Secondary | ICD-10-CM | POA: Insufficient documentation

## 2024-04-26 DIAGNOSIS — G8929 Other chronic pain: Secondary | ICD-10-CM | POA: Diagnosis not present

## 2024-04-26 DIAGNOSIS — R2689 Other abnormalities of gait and mobility: Secondary | ICD-10-CM | POA: Insufficient documentation

## 2024-04-26 DIAGNOSIS — M5459 Other low back pain: Secondary | ICD-10-CM | POA: Insufficient documentation

## 2024-04-26 DIAGNOSIS — M6281 Muscle weakness (generalized): Secondary | ICD-10-CM | POA: Diagnosis not present

## 2024-04-26 NOTE — Patient Instructions (Signed)
 Aquatic Therapy at [facility]-  What to Expect!  Where:   [facility] [address] [place], [place] [address] Rehab phone [phone]  NOTE:  You will receive an automated phone message reminding you of your appt and it will say the appointment is at the [address] [facility] clinic.          How to Prepare: Please make sure you drink 8 ounces of water about one hour prior to your pool session A caregiver may attend if needed with the patient to help assist as needed. A caregiver can sit in the pool room on chair. Please arrive IN YOUR SUIT and 15 minutes prior to your appointment - this helps to avoid delays in starting your session. Please make sure to attend to any toileting needs prior to entering the pool [place] rooms for changing are provided.   There is direct access to the pool deck form the locker room.  You can lock your belongings in a locker with lock provided. Once on the pool deck your therapist will ask if you have signed the Patient  Consent and Assignment of Benefits form before beginning treatment Your therapist may take your blood pressure prior to, during and after your session if indicated We usually try and create a home exercise program based on activities we do in the pool.  Please be thinking about who might be able to assist you in the pool should you need to participate in an aquatic home exercise program at the time of discharge if you need assistance.  Some patients do not want to or do not have the ability to participate in an aquatic home program - this is not a barrier in any way to you participating in aquatic therapy as part of your current therapy plan! After Discharge from PT, you can continue using home program at  the [facility], there is a drop-in fee for $5 ($45 a month)or for 60 years  or older $4.00 ($40 a month for seniors ) or any local [employer] pool.  Memberships for purchase are  available for gym/pool at [facility]  IT IS VERY IMPORTANT THAT YOUR LAST VISIT BE IN THE CLINIC AT [facility] [address] AFTER YOUR LAST AQUATIC VISIT.  PLEASE MAKE SURE THAT YOU HAVE A [address]  APPOINTMENT SCHEDULED.   About the pool: Pool is located approximately 500 FT from the entrance of the building.  Please bring a support person if you need assistance traveling this      distance.   Your therapist will assist you in entering the water; there are two ways to           enter: stairs with railings, and a mechanical lift. Your therapist will determine the most appropriate way for you.  Water temperature is usually between 88-90 degrees  There may be up to 2 other swimmers in the pool at the same time  The pool deck is tile, please wear shoes with good traction if you prefer not to be barefoot.    Contact Info:  For appointment scheduling and cancellations:         Please call the [facility]  PH:[phone]              Aquatic Therapy  Outpatient Rehabilitation @ [facility]       All sessions are 45 minutes

## 2024-05-04 NOTE — Therapy (Unsigned)
 OUTPATIENT PHYSICAL THERAPY TREATMENT NOTE   Patient Name: Gwendolyn [name] MRN: [id number] DOB:01-07-1941, 83 y.o., female Today's Date: 05/05/2024  END OF SESSION:  PT End of Session - 05/05/24 1212     Visit Number 2    Number of Visits 16    Date for PT Re-Evaluation 06/26/24    Authorization Type HTA    PT Start Time 1215    PT Stop Time 1255    PT Time Calculation (min) 40 min    Activity Tolerance Patient tolerated treatment well    Behavior During Therapy [facility] for tasks assessed/performed           Past Medical History:  Diagnosis Date   Allergy    Arthritis    Blood transfusion without reported diagnosis    Cataract    removed both eyes    Cervical radicular pain 02/11/2016   Glaucoma    low pressure glaucoma    Hyperlipidemia    Hypertension    Osteopenia    Parkinson's disease (HCC)    Pelvis fracture (HCC) 03/26/2002   both sides fx   Positive colorectal cancer screening using Cologuard test 04/14/2017   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    Vitamin D  deficiency    Past Surgical History:  Procedure Laterality Date   COLONOSCOPY     DILATION AND CURETTAGE OF UTERUS     age 38    right elbow   surgery  March 26, 2002   right hand ulner nerve surgery  March 26, 2002   I and d done right hand/wrist   right shoulder humerus fx  july 2003   surgery done   right total hip arthroplasty  2008   right total knee arthroplasty  sept 2012   right wrist plating  2004   TONSILLECTOMY  age 23   TOTAL HIP ARTHROPLASTY  10/22/2011   Procedure: TOTAL HIP ARTHROPLASTY ANTERIOR APPROACH;  Surgeon: Donnice JONETTA Car, MD;  Location: [facility];  Service: Orthopedics;  Laterality: Left;  Left Total Hip Arthroplasty,  Anterior Approach    TOTAL KNEE ARTHROPLASTY Left 04/28/2022   Procedure: TOTAL KNEE ARTHROPLASTY;  Surgeon: Car Donnice, MD;  Location: [facility];  Service: Orthopedics;  Laterality: Left;   Patient Active Problem List   Diagnosis Date Noted   S/P total  knee arthroplasty, 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   Parkinson's disease (HCC) 11/28/2021   B12 deficiency 05/28/2021   Cervical arthritis 09/27/2017   Open-angle glaucoma 05/14/2016   Generalized anxiety disorder 02/11/2016   Overweight (BMI 25.0-29.9) 04/25/2015   Medication management 02/12/2014   Essential hypertension 08/08/2013   Hyperlipidemia, mixed 08/08/2013   Abnormal glucose 08/08/2013   Vitamin D  deficiency 08/08/2013    PCP: Theophilus Andrews, Tully GRADE, MD   REFERRING PROVIDER: Patel, Donika K, DO  REFERRING DIAG: [phone] (ICD-10-CM) - Chronic bilateral low back pain without sciatica  Rationale for Evaluation and Treatment: Rehabilitation  THERAPY DIAG:  Other low back pain  Muscle weakness (generalized)  Other abnormalities of gait and mobility  ONSET DATE: chronic  SUBJECTIVE:  SUBJECTIVE STATEMENT:  No new c/o since initial session.  Has been compliant with HEP.    In 2022, she recalls noticing that she began walking with a limp.  She has low back pain sometimes at the end of the day.  She starting using a cane around the same time.  She had left knee replacement in 2023 and continued to walk with a limp.  She is very active and continues to go to balance class and water exercise.  She noticed that if she sits too long, she has pressure in the low back.  She has weakness in the right leg and has some difficulty with stairs and has to manually lift her right leg to get it into her car.  She denies weakness in the right leg or arms.  No problems with speech/swallow.  No numbness/tingling of the arms or legs.     She is retired Engineer, civil (consulting) from the surgical center.  She worked full-time until she was 59. She was very independent and kept up with her yard work until 2 years ago when her right leg  started to get weak.  Fortunately, she has no falls except a mechanical one when she lost balance while trying to push a stake into the ground.     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 MRI lumbar spine showed left foraminal stenosis at L2-3 and L3-4.  Per my review, there is atrophy of the paraspinal muscles.  EMG shows neurogenic changes involving rectus femoris, adductor longus, iliopsoas, and gluteus medius muscles.  No evidence of myopathy or active denervation.     UPDATE 04/10/2024:  She has low back pain and achy pain on top of her feet which is worse with prolonged activity.  She gets relief with tramadol. She uses a cane for support. 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  There has been no change in her leg weakness.  She continues to feel that at times, her foot will turn inwards.  Fortunately, she has not had any falls.   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  Myopathy is less likely given normal CK and no findings of myopathy on EMG.  EMG showed diffuse neurogenic changes involving the hip muscles without active denervation. MRI of the hip shows severe muscle atrophy of the iliopsoas, gluteus medius, and gluteus minimus along with paraspinal muscle atrophy.  There is no evidence of hyperreflexia or increased tone to suggest UMN pathology. Previously discussed additional testing such as CSF testing or muscle biopsy, and opted to monitor due to stability of symptoms.  PAIN:  Are you having pain? Yes: NPRS scale: 5/10 Pain location: low back Pain description: ache Aggravating factors: position changes Relieving factors: sitting   PRECAUTIONS: None  RED FLAGS: None   WEIGHT BEARING RESTRICTIONS: No  FALLS:  Has patient fallen in last 6 months? No  OCCUPATION: retired  PLOF: Independent  PATIENT GOALS: To  manage my low back pain  NEXT MD VISIT: TBD  OBJECTIVE:  Note: Objective measures were completed at Evaluation unless otherwise noted.  DIAGNOSTIC FINDINGS:  IMPRESSION: 1. 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 2. 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 3. Left lateral recess narrowing at L1-2 and L2-3, which could cause left L2 and L3 radiculopathy.     Electronically Signed  By: Franky Stanford M.D.   On: 03/17/2023 01:53  PATIENT SURVEYS:  Modified Oswestry:   Interpretation of scores: Score Category Description  0-20% Minimal Disability The patient can cope with most living activities. Usually no treatment is indicated apart from advice on lifting, sitting and exercise  21-40% Moderate Disability The patient experiences more pain and difficulty with sitting, lifting and standing. Travel and social life are more difficult and they may be disabled from work. Personal care, sexual activity and sleeping are not grossly affected, and the patient can usually be managed by conservative means  41-60% Severe Disability Pain remains the main problem in this group, but activities of daily living are affected. These patients require a detailed investigation  61-80% Crippled Back pain impinges on all aspects of the patient's life. Positive intervention is required  81-100% Bed-bound  These patients are either bed-bound or exaggerating their symptoms  Bluford FORBES Zoe DELENA Karon DELENA, et al. Surgery versus conservative management of stable thoracolumbar fracture: the PRESTO feasibility RCT. [place]): [employer]; 2021 Nov. [facility] Technology Assessment, No. 25.62.) Appendix 3, Oswestry Disability Index category descriptors. Available from: FindJewel[employer].cz  Minimally Clinically Important Difference (MCID) = 12.8%   MUSCLE LENGTH: Hamstrings: Right 70 deg; Left 70 deg   POSTURE:  decreased lumbar lordosis and flexed hip posture, knee valgus   PALPATION: deferred  LUMBAR ROM:   AROM eval  Flexion 90%  Extension 10%  Right lateral flexion 50%  Left lateral flexion 50%  Right rotation   Left rotation    (Blank rows = not tested)  LOWER EXTREMITY ROM:   WFL throughout  Active  Right eval Left eval  Hip flexion    Hip extension    Hip abduction    Hip adduction    Hip internal rotation    Hip external rotation    Knee flexion    Knee extension    Ankle dorsiflexion    Ankle plantarflexion    Ankle inversion    Ankle eversion     (Blank rows = not tested)  LOWER EXTREMITY MMT:  see 30s chair stand test  MMT Right eval Left eval  Hip flexion    Hip extension    Hip abduction    Hip adduction    Hip internal rotation    Hip external rotation    Knee flexion    Knee extension    Ankle dorsiflexion    Ankle plantarflexion    Ankle inversion    Ankle eversion     (Blank rows = not tested)  LUMBAR SPECIAL TESTS:  Straight leg raise test: Negative and Slump test: Negative  FUNCTIONAL TESTS:  30 seconds chair stand test 5 reps  GAIT: Distance walked: 65ft x2 Assistive device utilized: Single point cane Level of assistance: Complete Independence Comments: slow cadence  TREATMENT:       [facility] Adult PT Treatment:                                                DATE: 05/05/24 Therapeutic Exercise: Nustep L2 8 min Neuromuscular re-ed: Supine hip fallouts RTB 15x B, 15/15 S/L clams RTB 15/15 Bridge against RTB 15x Bridge with ball squeeze 15x Therapeutic Activity: Seated hamstring stretch 30s x2 B Supine march 15/15 P-ball curl ups 15x  [facility] Adult PT Treatment:                                                DATE: 04/26/24 Eval and HEP Self Care: Additional minutes spent for educating on updated Therapeutic Home Exercise  Program as well as comparing current status to condition at start of symptoms. This included exercises focusing on stretching, strengthening, with focus on eccentric aspects. Long term goals include an improvement in range of motion, strength, endurance as well as avoiding reinjury. Patient's frequency would include in 1-2 times a day, 3-5 times a week for a duration of 6-12 weeks. Proper technique shown and discussed handout in great detail. All questions were discussed and addressed.      PATIENT EDUCATION:  Education details: Discussed eval findings, rehab rationale and POC and patient is in agreement  Person educated: Patient Education method: Explanation and Handouts Education comprehension: verbalized understanding and needs further education  HOME EXERCISE PROGRAM: Access Code: [email or web address] URL: [email or web address] Date: 04/26/2024 Prepared by: Reyes Kohut  Exercises - Supine Bridge with Resistance Band  - 1-2 x daily - 5 x weekly - 3 sets - 10 reps - Hooklying Single Leg Bent Knee Fallouts with Resistance  - 1-2 x daily - 5 x weekly - 3 sets - 10 reps - Static Prone on Elbows  - 1-2 x daily - 5 x weekly - 1 sets - 2 min hold  ASSESSMENT:  CLINICAL IMPRESSION:    First f/u session included aerobic w/u followed by stretching and proximal hip and core strength and stability exercises.  Patient able to complete all requested tasks w/o symptom aggravation   Patient is a 83 y.o. female who was seen today for physical therapy evaluation and treatment for chronic low back pain and limited mobility due to underlying degenerative changes including stenosis.  Imaging studies show atrophy of lumbopelvic musculature.  BLE ROM and flexibility is functional and no neural tension signs noted.  Patient is a good candidate for OPPT to include aquatic PT, core strengthening and lumbar spine flexibility tasks.  OBJECTIVE IMPAIRMENTS: Abnormal gait, decreased activity tolerance,  decreased coordination, decreased mobility, difficulty walking, decreased ROM, decreased strength, impaired perceived functional ability, improper body mechanics, postural dysfunction, and pain.   ACTIVITY LIMITATIONS: carrying, lifting, bending, standing, sleeping, transfers, and bed mobility  PERSONAL FACTORS: Age, Fitness, Past/current experiences, and Time since onset of injury/illness/exacerbation are also affecting patient's functional outcome.   REHAB POTENTIAL: Good  CLINICAL DECISION MAKING: Evolving/moderate complexity  EVALUATION COMPLEXITY: Moderate   GOALS: Goals reviewed with patient? No  SHORT TERM GOALS: Target date: 05/24/2024  Patient to demonstrate independence in HEP  Baseline: F9MG [facility] Goal status: INITIAL  2.  Assess 2 MWT  Baseline: TBD Goal status: INITIAL  3.  Assess BERG for balance deficits Baseline: TBD Goal status: INITIAL  LONG TERM GOALS: Target date: 06/21/2024  Patient will acknowledge 4/10 pain at least once during episode of care   Baseline: 5/10 Goal status: INITIAL  2.  Patient will score at least 10/50 on ODI to signify clinically meaningful improvement in functional abilities.   Baseline: 16/50 Goal status: INITIAL  3.  Patient will increase 30s chair stand reps from 5 to 8 without arms to demonstrate and improved functional ability with less pain/difficulty as well as reduce fall risk.  Baseline: 5 Goal status: INITIAL  4.  Assess BERG goal as needed Baseline: TBD Goal status: INITIAL  5.  Assess 2 MWT for progress Baseline: TBD Goal status: INITIAL    PLAN:  PT FREQUENCY: 1-2x/week  PT DURATION: 6 weeks  PLANNED INTERVENTIONS: 97110-Therapeutic exercises, 97530- Therapeutic activity, [id number]- Neuromuscular re-education, 97535- Self Care, [id number]- Manual therapy, [id number]- Gait training, [phone]- Aquatic Therapy, [phone] (1-2 muscles), [phone] (3+ muscles)- Dry Needling, Patient/Family education, Balance training, and Stair  training.  PLAN FOR NEXT SESSION: HEP review and update, manual techniques as appropriate, aerobic tasks, ROM and flexibility activities, strengthening and PREs, TPDN, gait and balance training as needed     Pelagia Iacobucci M Wednesday Ericsson, PT 05/05/2024, 12:57 PM

## 2024-05-05 ENCOUNTER — Ambulatory Visit: Attending: Neurology

## 2024-05-05 DIAGNOSIS — M6281 Muscle weakness (generalized): Secondary | ICD-10-CM | POA: Diagnosis not present

## 2024-05-05 DIAGNOSIS — R2689 Other abnormalities of gait and mobility: Secondary | ICD-10-CM | POA: Diagnosis not present

## 2024-05-05 DIAGNOSIS — M5459 Other low back pain: Secondary | ICD-10-CM | POA: Insufficient documentation

## 2024-05-08 ENCOUNTER — Ambulatory Visit

## 2024-05-08 DIAGNOSIS — M5459 Other low back pain: Secondary | ICD-10-CM | POA: Diagnosis not present

## 2024-05-08 DIAGNOSIS — M6281 Muscle weakness (generalized): Secondary | ICD-10-CM

## 2024-05-08 DIAGNOSIS — R2689 Other abnormalities of gait and mobility: Secondary | ICD-10-CM

## 2024-05-08 NOTE — Therapy (Signed)
 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    Vitamin D  deficiency    Past Surgical History:  Procedure Laterality Date   COLONOSCOPY     DILATION AND CURETTAGE OF UTERUS     age 32    right elbow   surgery  March 26, 2002   right hand ulner nerve surgery  March 26, 2002   I and d done right hand/wrist   right shoulder humerus fx  july 2003   surgery done   right total hip arthroplasty  2008   right total knee arthroplasty  sept 2012   right wrist plating  2004   TONSILLECTOMY  age 76   TOTAL HIP ARTHROPLASTY  10/22/2011   Procedure: TOTAL HIP ARTHROPLASTY ANTERIOR APPROACH;  Surgeon: Donnice JONETTA Car, MD;  Location: [facility];  Service: Orthopedics;  Laterality: Left;  Left Total Hip Arthroplasty,  Anterior Approach    TOTAL KNEE ARTHROPLASTY Left 04/28/2022   Procedure: TOTAL KNEE ARTHROPLASTY;  Surgeon: Car Donnice, MD;  Location: [facility];  Service: Orthopedics;  Laterality: Left;   Patient Active Problem List   Diagnosis Date Noted   S/P total knee arthroplasty, 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   Parkinson's disease (HCC) 11/28/2021   B12 deficiency 05/28/2021   Cervical arthritis 09/27/2017   Open-angle glaucoma 05/14/2016   Generalized anxiety disorder 02/11/2016   Overweight (BMI 25.0-29.9) 04/25/2015   Medication management 02/12/2014   Essential hypertension 08/08/2013   Hyperlipidemia, mixed  08/08/2013   Abnormal glucose 08/08/2013   Vitamin D  deficiency 08/08/2013    PCP: Theophilus Andrews, Tully GRADE, MD   REFERRING PROVIDER: Patel, Donika K, DO  REFERRING DIAG: [phone] (ICD-10-CM) - Chronic bilateral low back pain without sciatica  Rationale for Evaluation and Treatment: Rehabilitation  THERAPY DIAG:  Other low back pain  Muscle weakness (generalized)  Other abnormalities of gait and mobility  ONSET DATE: chronic  SUBJECTIVE:  SUBJECTIVE STATEMENT:  Mild soreness mainly in UE's after last session, resolved in 24 hrs.  In 2022, she recalls noticing that she began walking with a limp.  She has low back pain sometimes at the end of the day.  She starting using a cane around the same time.  She had left knee replacement in 2023 and continued to walk with a limp.  She is very active and continues to go to balance class and water exercise.  She noticed that if she sits too long, she has pressure in the low back.  She has weakness in the right leg and has some difficulty with stairs and has to manually lift her right leg to get it into her car.  She denies weakness in the right leg or arms.  No problems with speech/swallow.  No numbness/tingling of the arms or legs.     She is retired Engineer, civil (consulting) from the surgical center.  She worked full-time until she was 44. She was very independent and kept up with her yard work until 2 years ago when her right leg started to get weak.  Fortunately, she has no falls except a mechanical one when she lost balance while trying to push a stake into the ground.     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 MRI lumbar spine showed left foraminal stenosis at L2-3 and L3-4.  Per my review, there is  atrophy of the paraspinal muscles.  EMG shows neurogenic changes involving rectus femoris, adductor longus, iliopsoas, and gluteus medius muscles.  No evidence of myopathy or active denervation.     UPDATE 04/10/2024:  She has low back pain and achy pain on top of her feet which is worse with prolonged activity.  She gets relief with tramadol. She uses a cane for support. 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  There has been no change in her leg weakness.  She continues to feel that at times, her foot will turn inwards.  Fortunately, she has not had any falls.   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  Myopathy is less likely given normal CK and no findings of myopathy on EMG.  EMG showed diffuse neurogenic changes involving the hip muscles without active denervation. MRI of the hip shows severe muscle atrophy of the iliopsoas, gluteus medius, and gluteus minimus along with paraspinal muscle atrophy.  There is no evidence of hyperreflexia or increased tone to suggest UMN pathology. Previously discussed additional testing such as CSF testing or muscle biopsy, and opted to monitor due to stability of symptoms.  PAIN:  Are you having pain? Yes: NPRS scale: 5/10 Pain location: low back Pain description: ache Aggravating factors: position changes Relieving factors: sitting   PRECAUTIONS: None  RED FLAGS: None   WEIGHT BEARING RESTRICTIONS: No  FALLS:  Has patient fallen in last 6 months? No  OCCUPATION: retired  PLOF: Independent  PATIENT GOALS: To manage my low back pain  NEXT MD VISIT: TBD  OBJECTIVE:  Note: Objective measures were completed at Evaluation unless otherwise noted.  DIAGNOSTIC FINDINGS:  IMPRESSION: 1. 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 2. 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 3. Left lateral recess  narrowing at L1-2 and L2-3, which could cause left L2 and L3 radiculopathy.     Electronically Signed  By: Franky Stanford M.D.   On: 03/17/2023 01:53  PATIENT SURVEYS:  Modified Oswestry:   Interpretation of scores: Score Category Description  0-20% Minimal Disability The patient can cope with most living activities. Usually no treatment is indicated apart from advice on lifting, sitting and exercise  21-40% Moderate Disability The patient experiences more pain and difficulty with sitting, lifting and standing. Travel and social life are more difficult and they may be disabled from work. Personal care, sexual activity and sleeping are not grossly affected, and the patient can usually be managed by conservative means  41-60% Severe Disability Pain remains the main problem in this group, but activities of daily living are affected. These patients require a detailed investigation  61-80% Crippled Back pain impinges on all aspects of the patient's life. Positive intervention is required  81-100% Bed-bound  These patients are either bed-bound or exaggerating their symptoms  Bluford FORBES Zoe DELENA Karon DELENA, et al. Surgery versus conservative management of stable thoracolumbar fracture: the PRESTO feasibility RCT. [place] ([place]): [employer]; 2021 Nov. [facility] Technology Assessment, No. 25.62.) Appendix 3, Oswestry Disability Index category descriptors. Available from: FindJewel[employer].cz  Minimally Clinically Important Difference (MCID) = 12.8%   MUSCLE LENGTH: Hamstrings: Right 70 deg; Left 70 deg   POSTURE: decreased lumbar lordosis and flexed hip posture, knee valgus   PALPATION: deferred  LUMBAR ROM:   AROM eval  Flexion 90%  Extension 10%  Right lateral flexion 50%  Left lateral flexion 50%  Right rotation   Left rotation    (Blank rows = not tested)  LOWER EXTREMITY ROM:   WFL throughout  Active  Right eval Left eval  Hip flexion     Hip extension    Hip abduction    Hip adduction    Hip internal rotation    Hip external rotation    Knee flexion    Knee extension    Ankle dorsiflexion    Ankle plantarflexion    Ankle inversion    Ankle eversion     (Blank rows = not tested)  LOWER EXTREMITY MMT:  see 30s chair stand test  MMT Right eval Left eval  Hip flexion    Hip extension    Hip abduction    Hip adduction    Hip internal rotation    Hip external rotation    Knee flexion    Knee extension    Ankle dorsiflexion    Ankle plantarflexion    Ankle inversion    Ankle eversion     (Blank rows = not tested)  LUMBAR SPECIAL TESTS:  Straight leg raise test: Negative and Slump test: Negative  FUNCTIONAL TESTS:  30 seconds chair stand test 5 reps  GAIT: Distance walked: 65ft x2 Assistive device utilized: Single point cane Level of assistance: Complete Independence Comments: slow cadence  TREATMENT:       [facility] Adult PT Treatment:                                                DATE: 05/08/24 Therapeutic Exercise: Nustep L3 8 min Neuromuscular re-ed: Supine hip fallouts GTB 10x B, 10/10 S/L clams GTB 10/10 Bridge against GTB 10x Bridge with ball squeeze 10x STS from airex pad Therapeutic Activity: Seated hamstring stretch 30s x2 B Supine QL stretch 30s x2 Supine march 15/15 with UE in 90d flexion P-ball  curl ups 10x B, 10/10 unilateral  [facility] Adult PT Treatment:                                                DATE: 05/05/24 Therapeutic Exercise: Nustep L2 8 min Neuromuscular re-ed: Supine hip fallouts RTB 15x B, 15/15 S/L clams RTB 15/15 Bridge against RTB 15x Bridge with ball squeeze 15x Therapeutic Activity: Seated hamstring stretch 30s x2 B Supine march 15/15 P-ball curl ups 15x                                                                                                                    [facility] Adult PT Treatment:                                                DATE: 04/26/24 Eval and  HEP Self Care: Additional minutes spent for educating on updated Therapeutic Home Exercise Program as well as comparing current status to condition at start of symptoms. This included exercises focusing on stretching, strengthening, with focus on eccentric aspects. Long term goals include an improvement in range of motion, strength, endurance as well as avoiding reinjury. Patient's frequency would include in 1-2 times a day, 3-5 times a week for a duration of 6-12 weeks. Proper technique shown and discussed handout in great detail. All questions were discussed and addressed.      PATIENT EDUCATION:  Education details: Discussed eval findings, rehab rationale and POC and patient is in agreement  Person educated: Patient Education method: Explanation and Handouts Education comprehension: verbalized understanding and needs further education  HOME EXERCISE PROGRAM: Access Code: [email or web address] URL: [email or web address] Date: 04/26/2024 Prepared by: Reyes Kohut  Exercises - Supine Bridge with Resistance Band  - 1-2 x daily - 5 x weekly - 3 sets - 10 reps - Hooklying Single Leg Bent Knee Fallouts with Resistance  - 1-2 x daily - 5 x weekly - 3 sets - 10 reps - Static Prone on Elbows  - 1-2 x daily - 5 x weekly - 1 sets - 2 min hold  ASSESSMENT:  CLINICAL IMPRESSION:    Continued with aerobic w/u followed first by stretching and then advancing to core and lumbopelvic strength and stability exercises.  Continued with core activation and strengthening tasks, adding reps and increasing difficulty as noted   Patient is a 83 y.o. female who was seen today for physical therapy evaluation and treatment for chronic low back pain and limited mobility due to underlying degenerative changes including stenosis.  Imaging studies show atrophy of lumbopelvic musculature.  BLE ROM and flexibility is functional and no neural tension signs noted.  Patient is a good candidate for OPPT to include aquatic  PT, core strengthening and lumbar spine flexibility  tasks.  OBJECTIVE IMPAIRMENTS: Abnormal gait, decreased activity tolerance, decreased coordination, decreased mobility, difficulty walking, decreased ROM, decreased strength, impaired perceived functional ability, improper body mechanics, postural dysfunction, and pain.   ACTIVITY LIMITATIONS: carrying, lifting, bending, standing, sleeping, transfers, and bed mobility  PERSONAL FACTORS: Age, Fitness, Past/current experiences, and Time since onset of injury/illness/exacerbation are also affecting patient's functional outcome.   REHAB POTENTIAL: Good  CLINICAL DECISION MAKING: Evolving/moderate complexity  EVALUATION COMPLEXITY: Moderate   GOALS: Goals reviewed with patient? No  SHORT TERM GOALS: Target date: 05/24/2024  Patient to demonstrate independence in HEP  Baseline: F9[facility] Goal status: INITIAL  2.  Assess 2 MWT  Baseline: TBD Goal status: INITIAL  3.  Assess BERG for balance deficits Baseline: TBD Goal status: INITIAL  LONG TERM GOALS: Target date: 06/21/2024  Patient will acknowledge 4/10 pain at least once during episode of care   Baseline: 5/10 Goal status: INITIAL  2.  Patient will score at least 10/50 on ODI to signify clinically meaningful improvement in functional abilities.   Baseline: 16/50 Goal status: INITIAL  3.  Patient will increase 30s chair stand reps from 5 to 8 without arms to demonstrate and improved functional ability with less pain/difficulty as well as reduce fall risk.  Baseline: 5 Goal status: INITIAL  4.  Assess BERG goal as needed Baseline: TBD Goal status: INITIAL  5.  Assess 2 MWT for progress Baseline: TBD Goal status: INITIAL    PLAN:  PT FREQUENCY: 1-2x/week  PT DURATION: 6 weeks  PLANNED INTERVENTIONS: 97110-Therapeutic exercises, 97530- Therapeutic activity, [id number]- Neuromuscular re-education, 97535- Self Care, [id number]- Manual therapy, [id number]- Gait training, [phone]-  Aquatic Therapy, [phone] (1-2 muscles), [phone] (3+ muscles)- Dry Needling, Patient/Family education, Balance training, and Stair training.  PLAN FOR NEXT SESSION: HEP review and update, manual techniques as appropriate, aerobic tasks, ROM and flexibility activities, strengthening and PREs, TPDN, gait and balance training as needed     Reyes CHRISTELLA Kohut, PT 05/08/2024, 1:55 PM

## 2024-05-10 ENCOUNTER — Encounter

## 2024-05-11 ENCOUNTER — Ambulatory Visit

## 2024-05-11 DIAGNOSIS — M6281 Muscle weakness (generalized): Secondary | ICD-10-CM

## 2024-05-11 DIAGNOSIS — M5459 Other low back pain: Secondary | ICD-10-CM

## 2024-05-11 DIAGNOSIS — R2689 Other abnormalities of gait and mobility: Secondary | ICD-10-CM

## 2024-05-11 NOTE — Therapy (Signed)
 OUTPATIENT PHYSICAL THERAPY TREATMENT NOTE   Patient Name: Gwendolyn [name] MRN: [id number] DOB:Dec 14, 1940, 83 y.o., female Today's Date: 05/11/2024  END OF SESSION:  PT End of Session - 05/11/24 1515     Visit Number 4    Number of Visits 16    Date for PT Re-Evaluation 06/26/24    Authorization Type HTA    PT Start Time 1515    PT Stop Time 1600    PT Time Calculation (min) 45 min    Activity Tolerance Patient tolerated treatment well    Behavior During Therapy 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    Vitamin D  deficiency    Past Surgical History:  Procedure Laterality Date   COLONOSCOPY     DILATION AND CURETTAGE OF UTERUS     age 89    right elbow   surgery  March 26, 2002   right hand ulner nerve surgery  March 26, 2002   I and d done right hand/wrist   right shoulder humerus fx  july 2003   surgery done   right total hip arthroplasty  2008   right total knee arthroplasty  sept 2012   right wrist plating  2004   TONSILLECTOMY  age 32   TOTAL HIP ARTHROPLASTY  10/22/2011   Procedure: TOTAL HIP ARTHROPLASTY ANTERIOR APPROACH;  Surgeon: Donnice JONETTA Car, MD;  Location: [facility];  Service: Orthopedics;  Laterality: Left;  Left Total Hip Arthroplasty,  Anterior Approach    TOTAL KNEE ARTHROPLASTY Left 04/28/2022   Procedure: TOTAL KNEE ARTHROPLASTY;  Surgeon: Car Donnice, MD;  Location: [facility];  Service: Orthopedics;  Laterality: Left;   Patient Active Problem List   Diagnosis Date Noted   S/P total knee  arthroplasty, 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   Parkinson's disease (HCC) 11/28/2021   B12 deficiency 05/28/2021   Cervical arthritis 09/27/2017   Open-angle glaucoma 05/14/2016   Generalized anxiety disorder 02/11/2016   Overweight (BMI 25.0-29.9) 04/25/2015   Medication management 02/12/2014   Essential hypertension 08/08/2013   Hyperlipidemia, mixed 08/08/2013   Abnormal glucose 08/08/2013   Vitamin D  deficiency 08/08/2013    PCP: Theophilus Andrews, Tully GRADE, MD   REFERRING PROVIDER: Patel, Donika K, DO  REFERRING DIAG: [id number] (ICD-10-CM) - Chronic bilateral low back pain without sciatica  Rationale for Evaluation and Treatment: Rehabilitation  THERAPY DIAG:  Other low back pain  Muscle weakness (generalized)  Other abnormalities of gait and mobility  ONSET DATE: chronic  SUBJECTIVE:  SUBJECTIVE STATEMENT: Patients reports some soreness in her arms from last session. She says that her pain is worse at the end of the day.   EVAL: In 2022, she recalls noticing that she began walking with a limp.  She has low back pain sometimes at the end of the day.  She starting using a cane around the same time.  She had left knee replacement in 2023 and continued to walk with a limp.  She is very active and continues to go to balance class and water exercise.  She noticed that if she sits too long, she has pressure in the low back.  She has weakness in the right leg and has some difficulty with stairs and has to manually lift her right leg to get it into her car.  She denies weakness in the right leg or arms.  No problems with speech/swallow.  No numbness/tingling of the arms or legs.     She is retired Engineer, civil (consulting) from the surgical center.  She worked full-time until she was 89. She was very independent and kept up with  her yard work until 2 years ago when her right leg started to get weak.  Fortunately, she has no falls except a mechanical one when she lost balance while trying to push a stake into the ground.     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 MRI lumbar spine showed left foraminal stenosis at L2-3 and L3-4.  Per my review, there is atrophy of the paraspinal muscles.  EMG shows neurogenic changes involving rectus femoris, adductor longus, iliopsoas, and gluteus medius muscles.  No evidence of myopathy or active denervation.     UPDATE 04/10/2024:  She has low back pain and achy pain on top of her feet which is worse with prolonged activity.  She gets relief with tramadol. She uses a cane for support. 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  There has been no change in her leg weakness.  She continues to feel that at times, her foot will turn inwards.  Fortunately, she has not had any falls.   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  Myopathy is less likely given normal CK and no findings of myopathy on EMG.  EMG showed diffuse neurogenic changes involving the hip muscles without active denervation. MRI of the hip shows severe muscle atrophy of the iliopsoas, gluteus medius, and gluteus minimus along with paraspinal muscle atrophy.  There is no evidence of hyperreflexia or increased tone to suggest UMN pathology. Previously discussed additional testing such as CSF testing or muscle biopsy, and opted to monitor due to stability of symptoms.  PAIN:  Are you having pain? Yes: NPRS scale: 5/10 Pain location: low back Pain description: ache Aggravating factors: position changes Relieving factors: sitting   PRECAUTIONS: None  RED FLAGS: None   WEIGHT BEARING RESTRICTIONS: No  FALLS:  Has patient fallen in last 6 months? No  OCCUPATION:  retired  PLOF: Independent  PATIENT GOALS: To manage my low back pain  NEXT MD VISIT: TBD  OBJECTIVE:  Note: Objective measures were completed at Evaluation unless otherwise noted.  DIAGNOSTIC FINDINGS:  IMPRESSION: 1. 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 2. 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 3. Left lateral recess narrowing at L1-2 and L2-3, which could cause left  L2 and L3 radiculopathy.     Electronically Signed   By: Franky Stanford M.D.   On: 03/17/2023 01:53  PATIENT SURVEYS:  Modified Oswestry:   Interpretation of scores: Score Category Description  0-20% Minimal Disability The patient can cope with most living activities. Usually no treatment is indicated apart from advice on lifting, sitting and exercise  21-40% Moderate Disability The patient experiences more pain and difficulty with sitting, lifting and standing. Travel and social life are more difficult and they may be disabled from work. Personal care, sexual activity and sleeping are not grossly affected, and the patient can usually be managed by conservative means  41-60% Severe Disability Pain remains the main problem in this group, but activities of daily living are affected. These patients require a detailed investigation  61-80% Crippled Back pain impinges on all aspects of the patient's life. Positive intervention is required  81-100% Bed-bound  These patients are either bed-bound or exaggerating their symptoms  Bluford FORBES Zoe DELENA Karon DELENA, et al. Surgery versus conservative management of stable thoracolumbar fracture: the PRESTO feasibility RCT. [place]): [employer]; 2021 Nov. [facility] Technology Assessment, No. 25.62.) Appendix 3, Oswestry Disability Index category descriptors. Available from: FindJewel[employer].cz  Minimally Clinically Important Difference (MCID) = 12.8%   MUSCLE  LENGTH: Hamstrings: Right 70 deg; Left 70 deg   POSTURE: decreased lumbar lordosis and flexed hip posture, knee valgus   PALPATION: deferred  LUMBAR ROM:   AROM eval  Flexion 90%  Extension 10%  Right lateral flexion 50%  Left lateral flexion 50%  Right rotation   Left rotation    (Blank rows = not tested)  LOWER EXTREMITY ROM:   WFL throughout  Active  Right eval Left eval  Hip flexion    Hip extension    Hip abduction    Hip adduction    Hip internal rotation    Hip external rotation    Knee flexion    Knee extension    Ankle dorsiflexion    Ankle plantarflexion    Ankle inversion    Ankle eversion     (Blank rows = not tested)  LOWER EXTREMITY MMT:  see 30s chair stand test  MMT Right eval Left eval  Hip flexion    Hip extension    Hip abduction    Hip adduction    Hip internal rotation    Hip external rotation    Knee flexion    Knee extension    Ankle dorsiflexion    Ankle plantarflexion    Ankle inversion    Ankle eversion     (Blank rows = not tested)  LUMBAR SPECIAL TESTS:  Straight leg raise test: Negative and Slump test: Negative  FUNCTIONAL TESTS:  30 seconds chair stand test 5 reps  GAIT: Distance walked: 21ft x2 Assistive device utilized: Single point cane Level of assistance: Complete Independence Comments: slow cadence  TREATMENT:    [facility] Adult PT Treatment:                                                DATE: 05/11/24 Aquatic therapy at [facility]- [address] - therapeutic pool temp approximately 91 degrees. Pt enters building with SPC independently. Treatment took place in water 3.8 to  4 ft 8 in. deep depending upon activity.  Pt entered and exited the pool via stair  and handrails independently with step to pattern. Patient entered water for aquatic therapy for first time and was introduced to principles and therapeutic effects of water as they ambulated and acclimated to pool.  Aquatic Exercise: Walking  forward/backwards/side stepping x 2 laps ea Side stepping rainbow DB shoulder abd/add x2 laps Half noodle pull down with focus on core activation 2x10 Half noodle thoracic rotation x1' Standing with UE support edge of pool: Hip abd/add x20 BIL Squats x20 Heel/toe raises x20 Hip ext/flex with knee straight x 20 BIL Hip Circles CW/CCW x10 each BIL  Pt requires the buoyancy of water for active assisted exercises with buoyancy supported for strengthening and AROM exercises. Hydrostatic pressure also supports joints by unweighting joint load by at least 50 % in 3-4 feet depth water. 80% in chest to neck deep water. Water will provide assistance with movement using the current and laminar flow while the buoyancy reduces weight bearing. Pt requires the viscosity of the water for resistance with strengthening exercises.     [facility] Adult PT Treatment:                                                DATE: 05/08/24 Therapeutic Exercise: Nustep L3 8 min Neuromuscular re-ed: Supine hip fallouts GTB 10x B, 10/10 S/L clams GTB 10/10 Bridge against GTB 10x Bridge with ball squeeze 10x STS from airex pad Therapeutic Activity: Seated hamstring stretch 30s x2 B Supine QL stretch 30s x2 Supine march 15/15 with UE in 90d flexion P-ball curl ups 10x B, 10/10 unilateral  [facility] Adult PT Treatment:                                                DATE: 05/05/24 Therapeutic Exercise: Nustep L2 8 min Neuromuscular re-ed: Supine hip fallouts RTB 15x B, 15/15 S/L clams RTB 15/15 Bridge against RTB 15x Bridge with ball squeeze 15x Therapeutic Activity: Seated hamstring stretch 30s x2 B Supine march 15/15 P-ball curl ups 15x                                                                                                                     PATIENT EDUCATION:  Education details: Discussed eval findings, rehab rationale and POC and patient is in agreement  Person educated: Patient Education method: Explanation and  Handouts Education comprehension: verbalized understanding and needs further education  HOME EXERCISE PROGRAM: Access Code: [email or web address] URL: [email or web address] Date: 04/26/2024 Prepared by: Reyes Kohut  Exercises - Supine Bridge with Resistance Band  - 1-2 x daily - 5 x weekly - 3 sets - 10 reps - Hooklying Single Leg Bent Knee Fallouts with Resistance  - 1-2 x daily - 5 x weekly - 3 sets - 10 reps -  Static Prone on Elbows  - 1-2 x daily - 5 x weekly - 1 sets - 2 min hold  ASSESSMENT:  CLINICAL IMPRESSION:  Patient presents to first aquatic PT session reporting some soreness in her UE from previous land session. Session today focused on core and proximal hip strengthening as wll as improving overall activity tolerance in the aquatic environment for use of buoyancy to offload joints and the viscosity of water as resistance during therapeutic exercise. Patient was able to tolerate all prescribed exercises in the aquatic environment with no adverse effects. Patient continues to benefit from skilled PT services on land and aquatic based and should be progressed as able to improve functional independence.    EVAL: Patient is a 83 y.o. female who was seen today for physical therapy evaluation and treatment for chronic low back pain and limited mobility due to underlying degenerative changes including stenosis.  Imaging studies show atrophy of lumbopelvic musculature.  BLE ROM and flexibility is functional and no neural tension signs noted.  Patient is a good candidate for OPPT to include aquatic PT, core strengthening and lumbar spine flexibility tasks.  OBJECTIVE IMPAIRMENTS: Abnormal gait, decreased activity tolerance, decreased coordination, decreased mobility, difficulty walking, decreased ROM, decreased strength, impaired perceived functional ability, improper body mechanics, postural dysfunction, and pain.   ACTIVITY LIMITATIONS: carrying, lifting, bending, standing,  sleeping, transfers, and bed mobility  PERSONAL FACTORS: Age, Fitness, Past/current experiences, and Time since onset of injury/illness/exacerbation are also affecting patient's functional outcome.   REHAB POTENTIAL: Good  CLINICAL DECISION MAKING: Evolving/moderate complexity  EVALUATION COMPLEXITY: Moderate   GOALS: Goals reviewed with patient? No  SHORT TERM GOALS: Target date: 05/24/2024  Patient to demonstrate independence in HEP  Baseline: F9MG [facility] Goal status: INITIAL  2.  Assess 2 MWT  Baseline: TBD Goal status: INITIAL  3.  Assess BERG for balance deficits Baseline: TBD Goal status: INITIAL  LONG TERM GOALS: Target date: 06/21/2024  Patient will acknowledge 4/10 pain at least once during episode of care   Baseline: 5/10 Goal status: INITIAL  2.  Patient will score at least 10/50 on ODI to signify clinically meaningful improvement in functional abilities.   Baseline: 16/50 Goal status: INITIAL  3.  Patient will increase 30s chair stand reps from 5 to 8 without arms to demonstrate and improved functional ability with less pain/difficulty as well as reduce fall risk.  Baseline: 5 Goal status: INITIAL  4.  Assess BERG goal as needed Baseline: TBD Goal status: INITIAL  5.  Assess 2 MWT for progress Baseline: TBD Goal status: INITIAL   PLAN:  PT FREQUENCY: 1-2x/week  PT DURATION: 6 weeks  PLANNED INTERVENTIONS: 97110-Therapeutic exercises, 97530- Therapeutic activity, [id number]- Neuromuscular re-education, 97535- Self Care, [id number]- Manual therapy, [id number]- Gait training, [phone]- Aquatic Therapy, [phone] (1-2 muscles), [id number] (3+ muscles)- Dry Needling, Patient/Family education, Balance training, and Stair training.  PLAN FOR NEXT SESSION: HEP review and update, manual techniques as appropriate, aerobic tasks, ROM and flexibility activities, strengthening and PREs, TPDN, gait and balance training as needed     Corean Pouch, PTA 05/11/2024, 4:07 PM

## 2024-05-16 ENCOUNTER — Telehealth: Payer: Self-pay | Admitting: *Deleted

## 2024-05-16 ENCOUNTER — Telehealth: Payer: Self-pay

## 2024-05-16 ENCOUNTER — Ambulatory Visit

## 2024-05-16 DIAGNOSIS — I10 Essential (primary) hypertension: Secondary | ICD-10-CM

## 2024-05-16 MED ORDER — LOSARTAN POTASSIUM-HCTZ 50-12.5 MG PO TABS
ORAL_TABLET | ORAL | 1 refills | Status: AC
Start: 2024-05-16 — End: ?

## 2024-05-16 NOTE — Telephone Encounter (Signed)
 Refill sent.

## 2024-05-16 NOTE — Telephone Encounter (Signed)
-----   Message from Jon VEAR Lindau sent at 05/16/2024  2:19 PM EDT ----- Hello,  Patient requesting refills on the following prescriptions:  Rosuvastatin  20mg  1 tablet MWF Losartan -Hydrochlorothiazide  50-12.5mg  1 tablet daily  Pharmacy Info: [id number] - [place],  - [address] P: [phone] F: [phone]   Previously written and prescribed by her previous PCP who passed away, has now established care here. She thinks pharmacy may have been sending refill requests to past provider.  Thank you! Jon VEAR Lindau, PharmD Clinical Pharmacist [phone]

## 2024-05-16 NOTE — Progress Notes (Signed)
   05/16/2024  Patient ID: Gwendolyn [name], female   DOB: 04/15/1941, 83 y.o.   MRN: [id number]  Pharmacy Quality Measure Review  This patient is appearing on a report for being at risk of failing the adherence measure for hypertension (ACEi/ARB) medications this calendar year.   Medication: Losartan -hydrochlorothiazide  50-12.5mg  Last fill date: 12/27/23 for 90 day supply  Will collaborate with provider to facilitate refill needs.  Jon VEAR Lindau, PharmD Clinical Pharmacist [phone]

## 2024-05-17 ENCOUNTER — Ambulatory Visit: Payer: Self-pay | Admitting: Physical Therapy

## 2024-05-17 ENCOUNTER — Ambulatory Visit: Admitting: Physical Therapy

## 2024-05-17 ENCOUNTER — Encounter: Payer: Self-pay | Admitting: Physical Therapy

## 2024-05-17 DIAGNOSIS — M6281 Muscle weakness (generalized): Secondary | ICD-10-CM

## 2024-05-17 DIAGNOSIS — M5459 Other low back pain: Secondary | ICD-10-CM

## 2024-05-17 DIAGNOSIS — R2689 Other abnormalities of gait and mobility: Secondary | ICD-10-CM

## 2024-05-17 NOTE — Therapy (Signed)
 OUTPATIENT PHYSICAL THERAPY TREATMENT NOTE   Patient Name: Gwendolyn [name] MRN: [id number] DOB:[birth year], 83 y.o., female Today's Date: 05/18/2024  END OF SESSION:  PT End of Session - 05/17/24 1543     Visit Number 5    Number of Visits 16    Date for PT Re-Evaluation 06/26/24    Authorization Type HTA    PT Start Time 1545    PT Stop Time 1626    PT Time Calculation (min) 41 min    Activity Tolerance Patient tolerated treatment well    Behavior During Therapy [facility] for tasks assessed/performed          Past Medical History:  Diagnosis Date   Allergy    Arthritis    Blood transfusion without reported diagnosis    Cataract    removed both eyes    Cervical radicular pain 02/11/2016   Glaucoma    low pressure glaucoma    Hyperlipidemia    Hypertension    Osteopenia    Parkinson's disease (HCC)    Pelvis fracture (HCC) 03/26/2002   both sides fx   Positive colorectal cancer screening using Cologuard test 04/14/2017   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    Vitamin D  deficiency    Past Surgical History:  Procedure Laterality Date   COLONOSCOPY     DILATION AND CURETTAGE OF UTERUS     age 72    right elbow   surgery  March 26, 2002   right hand ulner nerve surgery  March 26, 2002   I and d done right hand/wrist   right shoulder humerus fx  july 2003   surgery done   right total hip arthroplasty  2008   right total knee arthroplasty  sept 2012   right wrist plating  2004   TONSILLECTOMY  age 39   TOTAL HIP ARTHROPLASTY  10/22/2011   Procedure: TOTAL HIP ARTHROPLASTY ANTERIOR APPROACH;  Surgeon: Donnice JONETTA Car, MD;  Location: [facility];  Service: Orthopedics;  Laterality: Left;  Left Total Hip Arthroplasty,  Anterior Approach    TOTAL KNEE ARTHROPLASTY Left 04/28/2022   Procedure: TOTAL KNEE ARTHROPLASTY;  Surgeon: Car Donnice, MD;  Location: [facility];  Service: Orthopedics;  Laterality: Left;   Patient Active Problem List   Diagnosis Date Noted   S/P total  knee arthroplasty, 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   Parkinson's disease (HCC) 11/28/2021   B12 deficiency 05/28/2021   Cervical arthritis 09/27/2017   Open-angle glaucoma 05/14/2016   Generalized anxiety disorder 02/11/2016   Overweight (BMI 25.0-29.9) 04/25/2015   Medication management 02/12/2014   Essential hypertension 08/08/2013   Hyperlipidemia, mixed 08/08/2013   Abnormal glucose 08/08/2013   Vitamin D  deficiency 08/08/2013    PCP: Theophilus Andrews, Tully GRADE, MD   REFERRING PROVIDER: Patel, Donika K, DO  REFERRING DIAG: [id number] (ICD-10-CM) - Chronic bilateral low back pain without sciatica  Rationale for Evaluation and Treatment: Rehabilitation  THERAPY DIAG:  Other low back pain  Muscle weakness (generalized)  Other abnormalities of gait and mobility  ONSET DATE: chronic  SUBJECTIVE:  SUBJECTIVE STATEMENT: Pt reports that her back hurts the most when she walks without a cane.    EVAL: In 2022, she recalls noticing that she began walking with a limp.  She has low back pain sometimes at the end of the day.  She starting using a cane around the same time.  She had left knee replacement in 2023 and continued to walk with a limp.  She is very active and continues to go to balance class and water exercise.  She noticed that if she sits too long, she has pressure in the low back.  She has weakness in the right leg and has some difficulty with stairs and has to manually lift her right leg to get it into her car.  She denies weakness in the right leg or arms.  No problems with speech/swallow.  No numbness/tingling of the arms or legs.     She is retired Engineer, civil (consulting) from the surgical center.  She worked full-time until she was 89. She was very independent and kept up with her yard work until 2 years ago when her  right leg started to get weak.  Fortunately, she has no falls except a mechanical one when she lost balance while trying to push a stake into the ground.     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 MRI lumbar spine showed left foraminal stenosis at L2-3 and L3-4.  Per my review, there is atrophy of the paraspinal muscles.  EMG shows neurogenic changes involving rectus femoris, adductor longus, iliopsoas, and gluteus medius muscles.  No evidence of myopathy or active denervation.     UPDATE 04/10/2024:  She has low back pain and achy pain on top of her feet which is worse with prolonged activity.  She gets relief with tramadol. She uses a cane for support. 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  There has been no change in her leg weakness.  She continues to feel that at times, her foot will turn inwards.  Fortunately, she has not had any falls.   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  Myopathy is less likely given normal CK and no findings of myopathy on EMG.  EMG showed diffuse neurogenic changes involving the hip muscles without active denervation. MRI of the hip shows severe muscle atrophy of the iliopsoas, gluteus medius, and gluteus minimus along with paraspinal muscle atrophy.  There is no evidence of hyperreflexia or increased tone to suggest UMN pathology. Previously discussed additional testing such as CSF testing or muscle biopsy, and opted to monitor due to stability of symptoms.  PAIN:  Are you having pain? Yes: NPRS scale: 5/10 Pain location: low back Pain description: ache Aggravating factors: position changes Relieving factors: sitting   PRECAUTIONS: None  RED FLAGS: None   WEIGHT BEARING RESTRICTIONS: No  FALLS:  Has patient fallen in last 6 months? No  OCCUPATION: retired  PLOF: Independent  PATIENT  GOALS: To manage my low back pain  NEXT MD VISIT: TBD  OBJECTIVE:  Note: Objective measures were completed at Evaluation unless otherwise noted.  DIAGNOSTIC FINDINGS:  IMPRESSION: 1. 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 2. 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 3. Left lateral recess narrowing at L1-2 and L2-3, which could cause left L2 and L3 radiculopathy.  Electronically Signed   By: Franky Stanford M.D.   On: 03/17/2023 01:53  PATIENT SURVEYS:  Modified Oswestry:   Interpretation of scores: Score Category Description  0-20% Minimal Disability The patient can cope with most living activities. Usually no treatment is indicated apart from advice on lifting, sitting and exercise  21-40% Moderate Disability The patient experiences more pain and difficulty with sitting, lifting and standing. Travel and social life are more difficult and they may be disabled from work. Personal care, sexual activity and sleeping are not grossly affected, and the patient can usually be managed by conservative means  41-60% Severe Disability Pain remains the main problem in this group, but activities of daily living are affected. These patients require a detailed investigation  61-80% Crippled Back pain impinges on all aspects of the patient's life. Positive intervention is required  81-100% Bed-bound  These patients are either bed-bound or exaggerating their symptoms  Bluford FORBES Zoe DELENA Karon DELENA, et al. Surgery versus conservative management of stable thoracolumbar fracture: the PRESTO feasibility RCT. [place]): [employer]; 2021 Nov. [facility] Technology Assessment, No. 25.62.) Appendix 3, Oswestry Disability Index category descriptors. Available from: FindJewel[employer].cz  Minimally Clinically Important Difference (MCID) = 12.8%   MUSCLE LENGTH: Hamstrings: Right 70 deg; Left 70  deg   POSTURE: decreased lumbar lordosis and flexed hip posture, knee valgus   PALPATION: deferred  LUMBAR ROM:   AROM eval  Flexion 90%  Extension 10%  Right lateral flexion 50%  Left lateral flexion 50%  Right rotation   Left rotation    (Blank rows = not tested)  LOWER EXTREMITY ROM:   WFL throughout  Active  Right eval Left eval  Hip flexion    Hip extension    Hip abduction    Hip adduction    Hip internal rotation    Hip external rotation    Knee flexion    Knee extension    Ankle dorsiflexion    Ankle plantarflexion    Ankle inversion    Ankle eversion     (Blank rows = not tested)  LOWER EXTREMITY MMT:  see 30s chair stand test  MMT Right eval Left eval  Hip flexion    Hip extension    Hip abduction    Hip adduction    Hip internal rotation    Hip external rotation    Knee flexion    Knee extension    Ankle dorsiflexion    Ankle plantarflexion    Ankle inversion    Ankle eversion     (Blank rows = not tested)  LUMBAR SPECIAL TESTS:  Straight leg raise test: Negative and Slump test: Negative  FUNCTIONAL TESTS:  30 seconds chair stand test 5 reps  GAIT: Distance walked: 58ft x2 Assistive device utilized: Single point cane Level of assistance: Complete Independence Comments: slow cadence  TREATMENT:     [facility] Adult PT Treatment:                                                DATE: 05/17/24 Aquatic therapy at [facility]- [address] - therapeutic pool temp approximately 91 degrees. Pt enters building with SPC independently. Treatment took place in water 3.8 to  4 ft 8 in. deep depending upon activity.  Pt entered and exited the pool via stair and handrails independently with step to pattern.  Patient entered water for aquatic therapy for first time and was introduced to principles and therapeutic effects of water as they ambulated and acclimated to pool.  Aquatic Exercise: Walking forward/backwards/side stepping x 2 laps ea Side  stepping rainbow DB shoulder abd/add x2 laps black noodle pull down with focus on core activation 2x10 noodle thoracic rotation x1' Hip abd/add Squats Heel/toe raises Hip ext/flex with knee straight Hip Circles CW/CCW Step up fwd and lat  Pt requires the buoyancy of water for active assisted exercises with buoyancy supported for strengthening and AROM exercises. Hydrostatic pressure also supports joints by unweighting joint load by at least 50 % in 3-4 feet depth water. 80% in chest to neck deep water. Water will provide assistance with movement using the current and laminar flow while the buoyancy reduces weight bearing. Pt requires the viscosity of the water for resistance with strengthening exercises.  [facility] Adult PT Treatment:                                                DATE: 05/11/24 Aquatic therapy at [facility]- [address] - therapeutic pool temp approximately 91 degrees. Pt enters building with SPC independently. Treatment took place in water 3.8 to  4 ft 8 in. deep depending upon activity.  Pt entered and exited the pool via stair and handrails independently with step to pattern. Patient entered water for aquatic therapy for first time and was introduced to principles and therapeutic effects of water as they ambulated and acclimated to pool.  Aquatic Exercise: Walking forward/backwards/side stepping x 2 laps ea Side stepping rainbow DB shoulder abd/add x2 laps Half noodle pull down with focus on core activation 2x10 Half noodle thoracic rotation x1' Standing with UE support edge of pool: Hip abd/add x20 BIL Squats x20 Heel/toe raises x20 Hip ext/flex with knee straight x 20 BIL Hip Circles CW/CCW x10 each BIL  Pt requires the buoyancy of water for active assisted exercises with buoyancy supported for strengthening and AROM exercises. Hydrostatic pressure also supports joints by unweighting joint load by at least 50 % in 3-4 feet depth water. 80% in chest to neck deep  water. Water will provide assistance with movement using the current and laminar flow while the buoyancy reduces weight bearing. Pt requires the viscosity of the water for resistance with strengthening exercises.     [facility] Adult PT Treatment:                                                DATE: 05/08/24 Therapeutic Exercise: Nustep L3 8 min Neuromuscular re-ed: Supine hip fallouts GTB 10x B, 10/10 S/L clams GTB 10/10 Bridge against GTB 10x Bridge with ball squeeze 10x STS from airex pad Therapeutic Activity: Seated hamstring stretch 30s x2 B Supine QL stretch 30s x2 Supine march 15/15 with UE in 90d flexion P-ball curl ups 10x B, 10/10 unilateral  [facility] Adult PT Treatment:                                                DATE: 05/05/24 Therapeutic Exercise: Nustep L2 8 min  Neuromuscular re-ed: Supine hip fallouts RTB 15x B, 15/15 S/L clams RTB 15/15 Bridge against RTB 15x Bridge with ball squeeze 15x Therapeutic Activity: Seated hamstring stretch 30s x2 B Supine march 15/15 P-ball curl ups 15x                                                                                                                     PATIENT EDUCATION:  Education details: Discussed eval findings, rehab rationale and POC and patient is in agreement  Person educated: Patient Education method: Explanation and Handouts Education comprehension: verbalized understanding and needs further education  HOME EXERCISE PROGRAM: Access Code: [email or web address] URL: [email or web address] Date: 04/26/2024 Prepared by: Reyes Kohut  Exercises - Supine Bridge with Resistance Band  - 1-2 x daily - 5 x weekly - 3 sets - 10 reps - Hooklying Single Leg Bent Knee Fallouts with Resistance  - 1-2 x daily - 5 x weekly - 3 sets - 10 reps - Static Prone on Elbows  - 1-2 x daily - 5 x weekly - 1 sets - 2 min hold  ASSESSMENT:  CLINICAL IMPRESSION:  Session today focused on core and hip strengthening in the aquatic  environment for use of buoyancy to offload joints and the viscosity of water as resistance during therapeutic exercise.  Particular focus on R hip abd and ext d/t strength deficit following R hip replacement which may contribute to altered gait mechanics and low back pain.  Patient was able to tolerate all prescribed exercises in the aquatic environment with no adverse effects and reports 0/10 pain at the end of the session. Patient continues to benefit from skilled PT services on land and aquatic based and should be progressed as able to improve functional independence.    EVAL: Patient is a 83 y.o. female who was seen today for physical therapy evaluation and treatment for chronic low back pain and limited mobility due to underlying degenerative changes including stenosis.  Imaging studies show atrophy of lumbopelvic musculature.  BLE ROM and flexibility is functional and no neural tension signs noted.  Patient is a good candidate for OPPT to include aquatic PT, core strengthening and lumbar spine flexibility tasks.  OBJECTIVE IMPAIRMENTS: Abnormal gait, decreased activity tolerance, decreased coordination, decreased mobility, difficulty walking, decreased ROM, decreased strength, impaired perceived functional ability, improper body mechanics, postural dysfunction, and pain.   ACTIVITY LIMITATIONS: carrying, lifting, bending, standing, sleeping, transfers, and bed mobility  PERSONAL FACTORS: Age, Fitness, Past/current experiences, and Time since onset of injury/illness/exacerbation are also affecting patient's functional outcome.   REHAB POTENTIAL: Good  CLINICAL DECISION MAKING: Evolving/moderate complexity  EVALUATION COMPLEXITY: Moderate   GOALS: Goals reviewed with patient? No  SHORT TERM GOALS: Target date: 05/24/2024  Patient to demonstrate independence in HEP  Baseline: F9MG [facility] Goal status: INITIAL  2.  Assess 2 MWT  Baseline: TBD Goal status: INITIAL  3.  Assess BERG for  balance deficits Baseline: TBD Goal status: INITIAL  LONG TERM GOALS: Target date: 06/21/2024  Patient will acknowledge 4/10  pain at least once during episode of care   Baseline: 5/10 Goal status: INITIAL  2.  Patient will score at least 10/50 on ODI to signify clinically meaningful improvement in functional abilities.   Baseline: 16/50 Goal status: INITIAL  3.  Patient will increase 30s chair stand reps from 5 to 8 without arms to demonstrate and improved functional ability with less pain/difficulty as well as reduce fall risk.  Baseline: 5 Goal status: INITIAL  4.  Assess BERG goal as needed Baseline: TBD Goal status: INITIAL  5.  Assess 2 MWT for progress Baseline: TBD Goal status: INITIAL   PLAN:  PT FREQUENCY: 1-2x/week  PT DURATION: 6 weeks  PLANNED INTERVENTIONS: 97110-Therapeutic exercises, 97530- Therapeutic activity, [id number]- Neuromuscular re-education, 97535- Self Care, [id number]- Manual therapy, [id number]- Gait training, [phone]- Aquatic Therapy, [phone] (1-2 muscles), [id number] (3+ muscles)- Dry Needling, Patient/Family education, Balance training, and Stair training.  PLAN FOR NEXT SESSION: HEP review and update, manual techniques as appropriate, aerobic tasks, ROM and flexibility activities, strengthening and PREs, TPDN, gait and balance training as needed     Helene FORBES Gasmen, PT 05/18/2024, 7:32 AM

## 2024-05-18 ENCOUNTER — Encounter: Payer: Self-pay | Admitting: Physical Therapy

## 2024-05-18 ENCOUNTER — Ambulatory Visit

## 2024-05-18 DIAGNOSIS — M6281 Muscle weakness (generalized): Secondary | ICD-10-CM

## 2024-05-18 DIAGNOSIS — R2689 Other abnormalities of gait and mobility: Secondary | ICD-10-CM

## 2024-05-18 DIAGNOSIS — M5459 Other low back pain: Secondary | ICD-10-CM

## 2024-05-18 NOTE — Therapy (Signed)
 OUTPATIENT PHYSICAL THERAPY TREATMENT NOTE   Patient Name: Gwendolyn [name] MRN: [id number] DOB:[birth year], 83 y.o., female Today's Date: 05/18/2024  END OF SESSION:  PT End of Session - 05/18/24 1402     Visit Number 6    Number of Visits 16    Date for Recertification  06/26/24    Authorization Type HTA    PT Start Time 1400    PT Stop Time 1445    PT Time Calculation (min) 45 min    Activity Tolerance Patient tolerated treatment well    Behavior During Therapy [facility] for tasks assessed/performed          Past Medical History:  Diagnosis Date   Allergy    Arthritis    Blood transfusion without reported diagnosis    Cataract    removed both eyes    Cervical radicular pain 02/11/2016   Glaucoma    low pressure glaucoma    Hyperlipidemia    Hypertension    Osteopenia    Parkinson's disease (HCC)    Pelvis fracture (HCC) 03/26/2002   both sides fx   Positive colorectal cancer screening using Cologuard test 04/14/2017   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    Vitamin D  deficiency    Past Surgical History:  Procedure Laterality Date   COLONOSCOPY     DILATION AND CURETTAGE OF UTERUS     age 80    right elbow   surgery  March 26, 2002   right hand ulner nerve surgery  March 26, 2002   I and d done right hand/wrist   right shoulder humerus fx  july 2003   surgery done   right total hip arthroplasty  2008   right total knee arthroplasty  sept 2012   right wrist plating  2004   TONSILLECTOMY  age 61   TOTAL HIP ARTHROPLASTY  10/22/2011   Procedure: TOTAL HIP ARTHROPLASTY ANTERIOR APPROACH;  Surgeon: Donnice JONETTA Car, MD;  Location: [facility];  Service: Orthopedics;  Laterality: Left;  Left Total Hip Arthroplasty,  Anterior Approach    TOTAL KNEE ARTHROPLASTY Left 04/28/2022   Procedure: TOTAL KNEE ARTHROPLASTY;  Surgeon: Car Donnice, MD;  Location: [facility];  Service: Orthopedics;  Laterality: Left;   Patient Active Problem List   Diagnosis Date Noted   S/P total  knee arthroplasty, 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   Parkinson's disease (HCC) 11/28/2021   B12 deficiency 05/28/2021   Cervical arthritis 09/27/2017   Open-angle glaucoma 05/14/2016   Generalized anxiety disorder 02/11/2016   Overweight (BMI 25.0-29.9) 04/25/2015   Medication management 02/12/2014   Essential hypertension 08/08/2013   Hyperlipidemia, mixed 08/08/2013   Abnormal glucose 08/08/2013   Vitamin D  deficiency 08/08/2013    PCP: Theophilus Andrews, Tully GRADE, MD   REFERRING PROVIDER: Patel, Donika K, DO  REFERRING DIAG: [id number] (ICD-10-CM) - Chronic bilateral low back pain without sciatica  Rationale for Evaluation and Treatment: Rehabilitation  THERAPY DIAG:  Other low back pain  Muscle weakness (generalized)  Other abnormalities of gait and mobility  ONSET DATE: chronic  SUBJECTIVE:  SUBJECTIVE STATEMENT: Sore from pool yesterday.     EVAL: In 2022, she recalls noticing that she began walking with a limp.  She has low back pain sometimes at the end of the day.  She starting using a cane around the same time.  She had left knee replacement in 2023 and continued to walk with a limp.  She is very active and continues to go to balance class and water exercise.  She noticed that if she sits too long, she has pressure in the low back.  She has weakness in the right leg and has some difficulty with stairs and has to manually lift her right leg to get it into her car.  She denies weakness in the right leg or arms.  No problems with speech/swallow.  No numbness/tingling of the arms or legs.     She is retired Engineer, civil (consulting) from the surgical center.  She worked full-time until she was 55. She was very independent and kept up with her yard work until 2 years ago when her right leg started to get weak.  Fortunately,  she has no falls except a mechanical one when she lost balance while trying to push a stake into the ground.     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 MRI lumbar spine showed left foraminal stenosis at L2-3 and L3-4.  Per my review, there is atrophy of the paraspinal muscles.  EMG shows neurogenic changes involving rectus femoris, adductor longus, iliopsoas, and gluteus medius muscles.  No evidence of myopathy or active denervation.     UPDATE 04/10/2024:  She has low back pain and achy pain on top of her feet which is worse with prolonged activity.  She gets relief with tramadol. She uses a cane for support. 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  There has been no change in her leg weakness.  She continues to feel that at times, her foot will turn inwards.  Fortunately, she has not had any falls.   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  Myopathy is less likely given normal CK and no findings of myopathy on EMG.  EMG showed diffuse neurogenic changes involving the hip muscles without active denervation. MRI of the hip shows severe muscle atrophy of the iliopsoas, gluteus medius, and gluteus minimus along with paraspinal muscle atrophy.  There is no evidence of hyperreflexia or increased tone to suggest UMN pathology. Previously discussed additional testing such as CSF testing or muscle biopsy, and opted to monitor due to stability of symptoms.  PAIN:  Are you having pain? Yes: NPRS scale: 5/10 Pain location: low back Pain description: ache Aggravating factors: position changes Relieving factors: sitting   PRECAUTIONS: None  RED FLAGS: None   WEIGHT BEARING RESTRICTIONS: No  FALLS:  Has patient fallen in last 6 months? No  OCCUPATION: retired  PLOF: Independent  PATIENT GOALS: To manage my low back pain  NEXT MD  VISIT: TBD  OBJECTIVE:  Note: Objective measures were completed at Evaluation unless otherwise noted.  DIAGNOSTIC FINDINGS:  IMPRESSION: 1. 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 2. 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 3. Left lateral recess narrowing at L1-2 and L2-3, which could cause left L2 and L3 radiculopathy.     Electronically Signed   By: Franky Alexa HERO.D.  On: 03/17/2023 01:53  PATIENT SURVEYS:  Modified Oswestry:   Interpretation of scores: Score Category Description  0-20% Minimal Disability The patient can cope with most living activities. Usually no treatment is indicated apart from advice on lifting, sitting and exercise  21-40% Moderate Disability The patient experiences more pain and difficulty with sitting, lifting and standing. Travel and social life are more difficult and they may be disabled from work. Personal care, sexual activity and sleeping are not grossly affected, and the patient can usually be managed by conservative means  41-60% Severe Disability Pain remains the main problem in this group, but activities of daily living are affected. These patients require a detailed investigation  61-80% Crippled Back pain impinges on all aspects of the patient's life. Positive intervention is required  81-100% Bed-bound  These patients are either bed-bound or exaggerating their symptoms  Bluford FORBES Zoe DELENA Karon DELENA, et al. Surgery versus conservative management of stable thoracolumbar fracture: the PRESTO feasibility RCT. [place]): [employer]; 2021 Nov. [facility] [employer] Technology Assessment, No. 25.62.) Appendix 3, Oswestry Disability Index category descriptors. Available from: FindJewelers.cz  Minimally Clinically Important Difference (MCID) = 12.8%   MUSCLE LENGTH: Hamstrings: Right 70 deg; Left 70 deg   POSTURE: decreased lumbar lordosis and  flexed hip posture, knee valgus   PALPATION: deferred  LUMBAR ROM:   AROM eval  Flexion 90%  Extension 10%  Right lateral flexion 50%  Left lateral flexion 50%  Right rotation   Left rotation    (Blank rows = not tested)  LOWER EXTREMITY ROM:   WFL throughout  Active  Right eval Left eval  Hip flexion    Hip extension    Hip abduction    Hip adduction    Hip internal rotation    Hip external rotation    Knee flexion    Knee extension    Ankle dorsiflexion    Ankle plantarflexion    Ankle inversion    Ankle eversion     (Blank rows = not tested)  LOWER EXTREMITY MMT:  see 30s chair stand test  MMT Right eval Left eval  Hip flexion    Hip extension    Hip abduction    Hip adduction    Hip internal rotation    Hip external rotation    Knee flexion    Knee extension    Ankle dorsiflexion    Ankle plantarflexion    Ankle inversion    Ankle eversion     (Blank rows = not tested)  LUMBAR SPECIAL TESTS:  Straight leg raise test: Negative and Slump test: Negative  FUNCTIONAL TESTS:  30 seconds chair stand test 5 reps  GAIT: Distance walked: 80ft x2 Assistive device utilized: Single point cane Level of assistance: Complete Independence Comments: slow cadence  TREATMENT:    [facility] Adult PT Treatment:                                                DATE: 05/18/24 Therapeutic Exercise: Nustep L4 8 min  Neuromuscular re-ed:   05/18/24 0001  Berg Balance Test  Sit to Stand 4  Standing Unsupported 4  Sitting with Back Unsupported but Feet Supported on Floor or Stool 4  Stand to Sit 4  Transfers 4  Standing Unsupported with Eyes Closed 4  Standing Unsupported with Feet Together 4  From Standing,  Reach Forward with Outstretched Arm 4  From Standing Position, Pick up Object from Floor 4  From Standing Position, Turn to Look Behind Over each Shoulder 4  Turn 360 Degrees 2  Standing Unsupported, Alternately Place Feet on Step/Stool 4  Standing  Unsupported, One Foot in Front 3  Standing on One Leg 4  Total Score 53   Therapeutic Activity: 2 MWT 211ft with cane Supine hip fallouts GTB 15x B, 15/15 unilateral S/L clams GTB 15/15   [facility] Adult PT Treatment:                                                DATE: 05/17/24 Aquatic therapy at [facility] [address] - therapeutic pool temp approximately 91 degrees. Pt enters building with SPC independently. Treatment took place in water 3.8 to  4 ft 8 in. deep depending upon activity.  Pt entered and exited the pool via stair and handrails independently with step to pattern. Patient entered water for aquatic therapy for first time and was introduced to principles and therapeutic effects of water as they ambulated and acclimated to pool.  Aquatic Exercise: Walking forward/backwards/side stepping x 2 laps ea Side stepping rainbow DB shoulder abd/add x2 laps black noodle pull down with focus on core activation 2x10 noodle thoracic rotation x1' Hip abd/add Squats Heel/toe raises Hip ext/flex with knee straight Hip Circles CW/CCW Step up fwd and lat  Pt requires the buoyancy of water for active assisted exercises with buoyancy supported for strengthening and AROM exercises. Hydrostatic pressure also supports joints by unweighting joint load by at least 50 % in 3-4 feet depth water. 80% in chest to neck deep water. Water will provide assistance with movement using the current and laminar flow while the buoyancy reduces weight bearing. Pt requires the viscosity of the water for resistance with strengthening exercises.  [facility] Adult PT Treatment:                                                DATE: 05/11/24 Aquatic therapy at [facility]- [address] - therapeutic pool temp approximately 91 degrees. Pt enters building with SPC independently. Treatment took place in water 3.8 to  4 ft 8 in. deep depending upon activity.  Pt entered and exited the pool via stair and handrails  independently with step to pattern. Patient entered water for aquatic therapy for first time and was introduced to principles and therapeutic effects of water as they ambulated and acclimated to pool.  Aquatic Exercise: Walking forward/backwards/side stepping x 2 laps ea Side stepping rainbow DB shoulder abd/add x2 laps Half noodle pull down with focus on core activation 2x10 Half noodle thoracic rotation x1' Standing with UE support edge of pool: Hip abd/add x20 BIL Squats x20 Heel/toe raises x20 Hip ext/flex with knee straight x 20 BIL Hip Circles CW/CCW x10 each BIL  Pt requires the buoyancy of water for active assisted exercises with buoyancy supported for strengthening and AROM exercises. Hydrostatic pressure also supports joints by unweighting joint load by at least 50 % in 3-4 feet depth water. 80% in chest to neck deep water. Water will provide assistance with movement using the current and laminar flow while the buoyancy reduces weight bearing. Pt  requires the viscosity of the water for resistance with strengthening exercises.     [facility] Adult PT Treatment:                                                DATE: 05/08/24 Therapeutic Exercise: Nustep L3 8 min Neuromuscular re-ed: Supine hip fallouts GTB 10x B, 10/10 S/L clams GTB 10/10 Bridge against GTB 10x Bridge with ball squeeze 10x STS from airex pad Therapeutic Activity: Seated hamstring stretch 30s x2 B Supine QL stretch 30s x2 Supine march 15/15 with UE in 90d flexion P-ball curl ups 10x B, 10/10 unilateral  [facility] Adult PT Treatment:                                                DATE: 05/05/24 Therapeutic Exercise: Nustep L2 8 min Neuromuscular re-ed: Supine hip fallouts RTB 15x B, 15/15 S/L clams RTB 15/15 Bridge against RTB 15x Bridge with ball squeeze 15x Therapeutic Activity: Seated hamstring stretch 30s x2 B Supine march 15/15 P-ball curl ups 15x                                                                                                                      PATIENT EDUCATION:  Education details: Discussed eval findings, rehab rationale and POC and patient is in agreement  Person educated: Patient Education method: Explanation and Handouts Education comprehension: verbalized understanding and needs further education  HOME EXERCISE PROGRAM: Access Code: [email or web address] URL: [email or web address] Date: 04/26/2024 Prepared by: Reyes Kohut  Exercises - Supine Bridge with Resistance Band  - 1-2 x daily - 5 x weekly - 3 sets - 10 reps - Hooklying Single Leg Bent Knee Fallouts with Resistance  - 1-2 x daily - 5 x weekly - 3 sets - 10 reps - Static Prone on Elbows  - 1-2 x daily - 5 x weekly - 1 sets - 2 min hold  ASSESSMENT:  CLINICAL IMPRESSION: Assessed 2 MWT to establish baseline distance.  BERG test shows no static balance deficits.  Emphasized B hip strengthening with Tband focus on glute medius.    EVAL: Patient is a 83 y.o. female who was seen today for physical therapy evaluation and treatment for chronic low back pain and limited mobility due to underlying degenerative changes including stenosis.  Imaging studies show atrophy of lumbopelvic musculature.  BLE ROM and flexibility is functional and no neural tension signs noted.  Patient is a good candidate for OPPT to include aquatic PT, core strengthening and lumbar spine flexibility tasks.  OBJECTIVE IMPAIRMENTS: Abnormal gait, decreased activity tolerance, decreased coordination, decreased mobility, difficulty walking, decreased ROM, decreased strength, impaired perceived functional ability, improper body mechanics, postural dysfunction, and pain.   ACTIVITY LIMITATIONS: carrying, lifting,  bending, standing, sleeping, transfers, and bed mobility  PERSONAL FACTORS: Age, Fitness, Past/current experiences, and Time since onset of injury/illness/exacerbation are also affecting patient's functional outcome.   REHAB POTENTIAL:  Good  CLINICAL DECISION MAKING: Evolving/moderate complexity  EVALUATION COMPLEXITY: Moderate   GOALS: Goals reviewed with patient? No  SHORT TERM GOALS: Target date: 05/24/2024  Patient to demonstrate independence in HEP  Baseline: F9MG [facility] Goal status: INITIAL  2.  Assess 2 MWT  Baseline: TBD; 05/18/24 53/56 Goal status: Met  3.  Assess BERG for balance deficits Baseline: TBD; 05/18/24 53/56 Goal status: INITIAL  LONG TERM GOALS: Target date: 06/21/2024  Patient will acknowledge 4/10 pain at least once during episode of care   Baseline: 5/10 Goal status: INITIAL  2.  Patient will score at least 10/50 on ODI to signify clinically meaningful improvement in functional abilities.   Baseline: 16/50 Goal status: INITIAL  3.  Patient will increase 30s chair stand reps from 5 to 8 without arms to demonstrate and improved functional ability with less pain/difficulty as well as reduce fall risk.  Baseline: 5 Goal status: INITIAL  4.  Assess BERG goal as needed Baseline: TBD; no need Goal status: Met  5.  Assess 2 MWT for progress Baseline: TBD Goal status: INITIAL   PLAN:  PT FREQUENCY: 1-2x/week  PT DURATION: 6 weeks  PLANNED INTERVENTIONS: 97110-Therapeutic exercises, 97530- Therapeutic activity, [id number]- Neuromuscular re-education, 97535- Self Care, [id number]- Manual therapy, [id number]- Gait training, [phone]- Aquatic Therapy, [phone] (1-2 muscles), [id number] (3+ muscles)- Dry Needling, Patient/Family education, Balance training, and Stair training.  PLAN FOR NEXT SESSION: HEP review and update, manual techniques as appropriate, aerobic tasks, ROM and flexibility activities, strengthening and PREs, TPDN, gait and balance training as needed     Ewing Fandino M Molley Houser, PT 05/18/2024, 3:02 PM

## 2024-05-22 DIAGNOSIS — H401131 Primary open-angle glaucoma, bilateral, mild stage: Secondary | ICD-10-CM | POA: Diagnosis not present

## 2024-05-22 DIAGNOSIS — H04123 Dry eye syndrome of bilateral lacrimal glands: Secondary | ICD-10-CM | POA: Diagnosis not present

## 2024-05-22 NOTE — Therapy (Deleted)
 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    Vitamin D  deficiency    Past Surgical History:  Procedure Laterality Date   COLONOSCOPY     DILATION AND CURETTAGE OF UTERUS     age 89    right elbow   surgery  March 26, 2002   right hand ulner nerve surgery  March 26, 2002   I and d done right hand/wrist   right shoulder humerus fx  july 2003   surgery done   right total hip arthroplasty  2008   right total knee arthroplasty  sept 2012   right wrist plating  2004   TONSILLECTOMY  age 67   TOTAL HIP ARTHROPLASTY  10/22/2011   Procedure: TOTAL HIP ARTHROPLASTY ANTERIOR APPROACH;  Surgeon: Donnice JONETTA Car, MD;  Location: [facility];  Service: Orthopedics;  Laterality: Left;  Left Total Hip Arthroplasty,  Anterior Approach    TOTAL KNEE ARTHROPLASTY Left 04/28/2022   Procedure: TOTAL KNEE ARTHROPLASTY;  Surgeon: Car Donnice, MD;  Location: [facility];  Service: Orthopedics;  Laterality: Left;   Patient Active Problem List   Diagnosis Date Noted   S/P total knee arthroplasty, 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   Parkinson's disease (HCC) 11/28/2021   B12 deficiency 05/28/2021   Cervical arthritis 09/27/2017   Open-angle glaucoma 05/14/2016   Generalized anxiety disorder 02/11/2016   Overweight (BMI 25.0-29.9) 04/25/2015   Medication management 02/12/2014   Essential hypertension 08/08/2013   Hyperlipidemia, mixed  08/08/2013   Abnormal glucose 08/08/2013   Vitamin D  deficiency 08/08/2013    PCP: Theophilus Andrews, Tully GRADE, MD   REFERRING PROVIDER: Patel, Donika K, DO  REFERRING DIAG: [phone] (ICD-10-CM) - Chronic bilateral low back pain without sciatica  Rationale for Evaluation and Treatment: Rehabilitation  THERAPY DIAG:  No diagnosis found.  ONSET DATE: chronic  SUBJECTIVE:  SUBJECTIVE STATEMENT: Sore from pool yesterday.     EVAL: In 2022, she recalls noticing that she began walking with a limp.  She has low back pain sometimes at the end of the day.  She starting using a cane around the same time.  She had left knee replacement in 2023 and continued to walk with a limp.  She is very active and continues to go to balance class and water exercise.  She noticed that if she sits too long, she has pressure in the low back.  She has weakness in the right leg and has some difficulty with stairs and has to manually lift her right leg to get it into her car.  She denies weakness in the right leg or arms.  No problems with speech/swallow.  No numbness/tingling of the arms or legs.     She is retired Engineer, civil (consulting) from the surgical center.  She worked full-time until she was 70. She was very independent and kept up with her yard work until 2 years ago when her right leg started to get weak.  Fortunately, she has no falls except a mechanical one when she lost balance while trying to push a stake into the ground.     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 MRI lumbar spine showed left foraminal stenosis at L2-3 and L3-4.  Per my review, there is atrophy of the paraspinal muscles.  EMG shows neurogenic changes involving rectus femoris, adductor longus,  iliopsoas, and gluteus medius muscles.  No evidence of myopathy or active denervation.     UPDATE 04/10/2024:  She has low back pain and achy pain on top of her feet which is worse with prolonged activity.  She gets relief with tramadol. She uses a cane for support. 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  There has been no change in her leg weakness.  She continues to feel that at times, her foot will turn inwards.  Fortunately, she has not had any falls.   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  Myopathy is less likely given normal CK and no findings of myopathy on EMG.  EMG showed diffuse neurogenic changes involving the hip muscles without active denervation. MRI of the hip shows severe muscle atrophy of the iliopsoas, gluteus medius, and gluteus minimus along with paraspinal muscle atrophy.  There is no evidence of hyperreflexia or increased tone to suggest UMN pathology. Previously discussed additional testing such as CSF testing or muscle biopsy, and opted to monitor due to stability of symptoms.  PAIN:  Are you having pain? Yes: NPRS scale: 5/10 Pain location: low back Pain description: ache Aggravating factors: position changes Relieving factors: sitting   PRECAUTIONS: None  RED FLAGS: None   WEIGHT BEARING RESTRICTIONS: No  FALLS:  Has patient fallen in last 6 months? No  OCCUPATION: retired  PLOF: Independent  PATIENT GOALS: To manage my low back pain  NEXT MD VISIT: TBD  OBJECTIVE:  Note: Objective measures were completed at Evaluation unless otherwise noted.  DIAGNOSTIC FINDINGS:  IMPRESSION: 1. 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 2. 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 3. Left lateral recess narrowing at L1-2 and L2-3, which could cause left L2 and L3 radiculopathy.     Electronically Signed   By:  Franky Alexa HERO.D.  On: 03/17/2023 01:53  PATIENT SURVEYS:  Modified Oswestry:   Interpretation of scores: Score Category Description  0-20% Minimal Disability The patient can cope with most living activities. Usually no treatment is indicated apart from advice on lifting, sitting and exercise  21-40% Moderate Disability The patient experiences more pain and difficulty with sitting, lifting and standing. Travel and social life are more difficult and they may be disabled from work. Personal care, sexual activity and sleeping are not grossly affected, and the patient can usually be managed by conservative means  41-60% Severe Disability Pain remains the main problem in this group, but activities of daily living are affected. These patients require a detailed investigation  61-80% Crippled Back pain impinges on all aspects of the patient's life. Positive intervention is required  81-100% Bed-bound  These patients are either bed-bound or exaggerating their symptoms  Bluford FORBES Zoe DELENA Karon DELENA, et al. Surgery versus conservative management of stable thoracolumbar fracture: the PRESTO feasibility RCT. [place] ([place]): [employer]; 2021 Nov. [facility] Technology Assessment, No. 25.62.) Appendix 3, Oswestry Disability Index category descriptors. Available from: FindJewel[employer].cz  Minimally Clinically Important Difference (MCID) = 12.8%   MUSCLE LENGTH: Hamstrings: Right 70 deg; Left 70 deg   POSTURE: decreased lumbar lordosis and flexed hip posture, knee valgus   PALPATION: deferred  LUMBAR ROM:   AROM eval  Flexion 90%  Extension 10%  Right lateral flexion 50%  Left lateral flexion 50%  Right rotation   Left rotation    (Blank rows = not tested)  LOWER EXTREMITY ROM:   WFL throughout  Active  Right eval Left eval  Hip flexion    Hip extension    Hip abduction    Hip adduction    Hip internal rotation    Hip external rotation    Knee  flexion    Knee extension    Ankle dorsiflexion    Ankle plantarflexion    Ankle inversion    Ankle eversion     (Blank rows = not tested)  LOWER EXTREMITY MMT:  see 30s chair stand test  MMT Right eval Left eval  Hip flexion    Hip extension    Hip abduction    Hip adduction    Hip internal rotation    Hip external rotation    Knee flexion    Knee extension    Ankle dorsiflexion    Ankle plantarflexion    Ankle inversion    Ankle eversion     (Blank rows = not tested)  LUMBAR SPECIAL TESTS:  Straight leg raise test: Negative and Slump test: Negative  FUNCTIONAL TESTS:  30 seconds chair stand test 5 reps  GAIT: Distance walked: 58ft x2 Assistive device utilized: Single point cane Level of assistance: Complete Independence Comments: slow cadence  TREATMENT:    [facility] Adult PT Treatment:                                                DATE: 05/18/24 Therapeutic Exercise: Nustep L4 8 min  Neuromuscular re-ed:   05/18/24 0001  Berg Balance Test  Sit to Stand 4  Standing Unsupported 4  Sitting with Back Unsupported but Feet Supported on Floor or Stool 4  Stand to Sit 4  Transfers 4  Standing Unsupported with Eyes Closed 4  Standing Unsupported with Feet Together 4  From Standing,  Reach Forward with Outstretched Arm 4  From Standing Position, Pick up Object from Floor 4  From Standing Position, Turn to Look Behind Over each Shoulder 4  Turn 360 Degrees 2  Standing Unsupported, Alternately Place Feet on Step/Stool 4  Standing Unsupported, One Foot in Front 3  Standing on One Leg 4  Total Score 53   Therapeutic Activity: 2 MWT 266ft with cane Supine hip fallouts GTB 15x B, 15/15 unilateral S/L clams GTB 15/15   [facility] Adult PT Treatment:                                                DATE: 05/17/24 Aquatic therapy at [facility]- [address] - therapeutic pool temp approximately 91 degrees. Pt enters building with SPC independently. Treatment took place  in water 3.8 to  4 ft 8 in. deep depending upon activity.  Pt entered and exited the pool via stair and handrails independently with step to pattern. Patient entered water for aquatic therapy for first time and was introduced to principles and therapeutic effects of water as they ambulated and acclimated to pool.  Aquatic Exercise: Walking forward/backwards/side stepping x 2 laps ea Side stepping rainbow DB shoulder abd/add x2 laps black noodle pull down with focus on core activation 2x10 noodle thoracic rotation x1' Hip abd/add Squats Heel/toe raises Hip ext/flex with knee straight Hip Circles CW/CCW Step up fwd and lat  Pt requires the buoyancy of water for active assisted exercises with buoyancy supported for strengthening and AROM exercises. Hydrostatic pressure also supports joints by unweighting joint load by at least 50 % in 3-4 feet depth water. 80% in chest to neck deep water. Water will provide assistance with movement using the current and laminar flow while the buoyancy reduces weight bearing. Pt requires the viscosity of the water for resistance with strengthening exercises.  [facility] Adult PT Treatment:                                                DATE: 05/11/24 Aquatic therapy at [facility]- [address] - therapeutic pool temp approximately 91 degrees. Pt enters building with SPC independently. Treatment took place in water 3.8 to  4 ft 8 in. deep depending upon activity.  Pt entered and exited the pool via stair and handrails independently with step to pattern. Patient entered water for aquatic therapy for first time and was introduced to principles and therapeutic effects of water as they ambulated and acclimated to pool.  Aquatic Exercise: Walking forward/backwards/side stepping x 2 laps ea Side stepping rainbow DB shoulder abd/add x2 laps Half noodle pull down with focus on core activation 2x10 Half noodle thoracic rotation x1' Standing with UE support edge of  pool: Hip abd/add x20 BIL Squats x20 Heel/toe raises x20 Hip ext/flex with knee straight x 20 BIL Hip Circles CW/CCW x10 each BIL  Pt requires the buoyancy of water for active assisted exercises with buoyancy supported for strengthening and AROM exercises. Hydrostatic pressure also supports joints by unweighting joint load by at least 50 % in 3-4 feet depth water. 80% in chest to neck deep water. Water will provide assistance with movement using the current and laminar flow while the buoyancy reduces weight bearing. Pt  requires the viscosity of the water for resistance with strengthening exercises.     [facility] Adult PT Treatment:                                                DATE: 05/08/24 Therapeutic Exercise: Nustep L3 8 min Neuromuscular re-ed: Supine hip fallouts GTB 10x B, 10/10 S/L clams GTB 10/10 Bridge against GTB 10x Bridge with ball squeeze 10x STS from airex pad Therapeutic Activity: Seated hamstring stretch 30s x2 B Supine QL stretch 30s x2 Supine march 15/15 with UE in 90d flexion P-ball curl ups 10x B, 10/10 unilateral  [facility] Adult PT Treatment:                                                DATE: 05/05/24 Therapeutic Exercise: Nustep L2 8 min Neuromuscular re-ed: Supine hip fallouts RTB 15x B, 15/15 S/L clams RTB 15/15 Bridge against RTB 15x Bridge with ball squeeze 15x Therapeutic Activity: Seated hamstring stretch 30s x2 B Supine march 15/15 P-ball curl ups 15x                                                                                                                     PATIENT EDUCATION:  Education details: Discussed eval findings, rehab rationale and POC and patient is in agreement  Person educated: Patient Education method: Explanation and Handouts Education comprehension: verbalized understanding and needs further education  HOME EXERCISE PROGRAM: Access Code: [email or web address] [facility] URL: [email or web address] Date: 04/26/2024 Prepared by: Reyes Kohut  Exercises - Supine Bridge with Resistance Band  - 1-2 x daily - 5 x weekly - 3 sets - 10 reps - Hooklying Single Leg Bent Knee Fallouts with Resistance  - 1-2 x daily - 5 x weekly - 3 sets - 10 reps - Static Prone on Elbows  - 1-2 x daily - 5 x weekly - 1 sets - 2 min hold  ASSESSMENT:  CLINICAL IMPRESSION: Assessed 2 MWT to establish baseline distance.  BERG test shows no static balance deficits.  Emphasized B hip strengthening with Tband focus on glute medius.    EVAL: Patient is a 83 y.o. female who was seen today for physical therapy evaluation and treatment for chronic low back pain and limited mobility due to underlying degenerative changes including stenosis.  Imaging studies show atrophy of lumbopelvic musculature.  BLE ROM and flexibility is functional and no neural tension signs noted.  Patient is a good candidate for OPPT to include aquatic PT, core strengthening and lumbar spine flexibility tasks.  OBJECTIVE IMPAIRMENTS: Abnormal gait, decreased activity tolerance, decreased coordination, decreased mobility, difficulty walking, decreased ROM, decreased strength, impaired perceived functional ability, improper body mechanics, postural dysfunction, and pain.   ACTIVITY LIMITATIONS: carrying, lifting,  bending, standing, sleeping, transfers, and bed mobility  PERSONAL FACTORS: Age, Fitness, Past/current experiences, and Time since onset of injury/illness/exacerbation are also affecting patient's functional outcome.   REHAB POTENTIAL: Good  CLINICAL DECISION MAKING: Evolving/moderate complexity  EVALUATION COMPLEXITY: Moderate   GOALS: Goals reviewed with patient? No  SHORT TERM GOALS: Target date: 05/24/2024  Patient to demonstrate independence in HEP  Baseline: F9MG [facility] Goal status: INITIAL  2.  Assess 2 MWT  Baseline: TBD; 05/18/24 53/56 Goal status: Met  3.  Assess BERG for balance deficits Baseline: TBD; 05/18/24 53/56 Goal status: INITIAL  LONG TERM  GOALS: Target date: 06/21/2024  Patient will acknowledge 4/10 pain at least once during episode of care   Baseline: 5/10 Goal status: INITIAL  2.  Patient will score at least 10/50 on ODI to signify clinically meaningful improvement in functional abilities.   Baseline: 16/50 Goal status: INITIAL  3.  Patient will increase 30s chair stand reps from 5 to 8 without arms to demonstrate and improved functional ability with less pain/difficulty as well as reduce fall risk.  Baseline: 5 Goal status: INITIAL  4.  Assess BERG goal as needed Baseline: TBD; no need Goal status: Met  5.  Assess 2 MWT for progress Baseline: TBD Goal status: INITIAL   PLAN:  PT FREQUENCY: 1-2x/week  PT DURATION: 6 weeks  PLANNED INTERVENTIONS: 97110-Therapeutic exercises, 97530- Therapeutic activity, [id number]- Neuromuscular re-education, 97535- Self Care, [id number]- Manual therapy, [id number]- Gait training, [phone]- Aquatic Therapy, [phone] (1-2 muscles), [phone] (3+ muscles)- Dry Needling, Patient/Family education, Balance training, and Stair training.  PLAN FOR NEXT SESSION: HEP review and update, manual techniques as appropriate, aerobic tasks, ROM and flexibility activities, strengthening and PREs, TPDN, gait and balance training as needed     Reyes CHRISTELLA Kohut, PT 05/22/2024, 10:09 AM

## 2024-05-23 ENCOUNTER — Ambulatory Visit

## 2024-05-25 ENCOUNTER — Ambulatory Visit

## 2024-05-26 ENCOUNTER — Encounter: Payer: Self-pay | Admitting: Physical Therapy

## 2024-05-26 ENCOUNTER — Ambulatory Visit: Payer: Self-pay | Admitting: Physical Therapy

## 2024-05-26 DIAGNOSIS — M5459 Other low back pain: Secondary | ICD-10-CM

## 2024-05-26 DIAGNOSIS — R2689 Other abnormalities of gait and mobility: Secondary | ICD-10-CM

## 2024-05-26 DIAGNOSIS — M6281 Muscle weakness (generalized): Secondary | ICD-10-CM

## 2024-05-26 NOTE — Therapy (Signed)
 OUTPATIENT PHYSICAL THERAPY TREATMENT NOTE   Patient Name: Gwendolyn [name] MRN: [id number] DOB:13-Oct-1940, 83 y.o., female Today's Date: 05/26/2024  END OF SESSION:  PT End of Session - 05/26/24 0801     Visit Number 7    Number of Visits 16    Date for Recertification  06/26/24    Authorization Type HTA    PT Start Time 0800    PT Stop Time 0841    PT Time Calculation (min) 41 min    Activity Tolerance Patient tolerated treatment well    Behavior During Therapy [facility] for tasks assessed/performed           Past Medical History:  Diagnosis Date   Allergy    Arthritis    Blood transfusion without reported diagnosis    Cataract    removed both eyes    Cervical radicular pain 02/11/2016   Glaucoma    low pressure glaucoma    Hyperlipidemia    Hypertension    Osteopenia    Parkinson's disease (HCC)    Pelvis fracture (HCC) 03/26/2002   both sides fx   Positive colorectal cancer screening using Cologuard test 04/14/2017   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    Vitamin D  deficiency    Past Surgical History:  Procedure Laterality Date   COLONOSCOPY     DILATION AND CURETTAGE OF UTERUS     age 5    right elbow   surgery  March 26, 2002   right hand ulner nerve surgery  March 26, 2002   I and d done right hand/wrist   right shoulder humerus fx  july 2003   surgery done   right total hip arthroplasty  2008   right total knee arthroplasty  sept 2012   right wrist plating  2004   TONSILLECTOMY  age 68   TOTAL HIP ARTHROPLASTY  10/22/2011   Procedure: TOTAL HIP ARTHROPLASTY ANTERIOR APPROACH;  Surgeon: Donnice JONETTA Car, MD;  Location: [facility];  Service: Orthopedics;  Laterality: Left;  Left Total Hip Arthroplasty,  Anterior Approach    TOTAL KNEE ARTHROPLASTY Left 04/28/2022   Procedure: TOTAL KNEE ARTHROPLASTY;  Surgeon: Car Donnice, MD;  Location: [facility];  Service: Orthopedics;  Laterality: Left;   Patient Active Problem List   Diagnosis Date Noted   S/P total  knee arthroplasty, 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   Parkinson's disease (HCC) 11/28/2021   B12 deficiency 05/28/2021   Cervical arthritis 09/27/2017   Open-angle glaucoma 05/14/2016   Generalized anxiety disorder 02/11/2016   Overweight (BMI 25.0-29.9) 04/25/2015   Medication management 02/12/2014   Essential hypertension 08/08/2013   Hyperlipidemia, mixed 08/08/2013   Abnormal glucose 08/08/2013   Vitamin D  deficiency 08/08/2013    PCP: Theophilus Andrews, Tully GRADE, MD   REFERRING PROVIDER: Patel, Donika K, DO  REFERRING DIAG: [phone] (ICD-10-CM) - Chronic bilateral low back pain without sciatica  Rationale for Evaluation and Treatment: Rehabilitation  THERAPY DIAG:  Other low back pain  Muscle weakness (generalized)  Other abnormalities of gait and mobility  ONSET DATE: chronic  SUBJECTIVE:  SUBJECTIVE STATEMENT: Pt reports her pain is minimal right now.  Her pain gets worse throughout the the day.      EVAL: In 2022, she recalls noticing that she began walking with a limp.  She has low back pain sometimes at the end of the day.  She starting using a cane around the same time.  She had left knee replacement in 2023 and continued to walk with a limp.  She is very active and continues to go to balance class and water exercise.  She noticed that if she sits too long, she has pressure in the low back.  She has weakness in the right leg and has some difficulty with stairs and has to manually lift her right leg to get it into her car.  She denies weakness in the right leg or arms.  No problems with speech/swallow.  No numbness/tingling of the arms or legs.     She is retired Engineer, civil (consulting) from the surgical center.  She worked full-time until she was 50. She was very independent and kept up with her yard work until 2  years ago when her right leg started to get weak.  Fortunately, she has no falls except a mechanical one when she lost balance while trying to push a stake into the ground.     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 MRI lumbar spine showed left foraminal stenosis at L2-3 and L3-4.  Per my review, there is atrophy of the paraspinal muscles.  EMG shows neurogenic changes involving rectus femoris, adductor longus, iliopsoas, and gluteus medius muscles.  No evidence of myopathy or active denervation.     UPDATE 04/10/2024:  She has low back pain and achy pain on top of her feet which is worse with prolonged activity.  She gets relief with tramadol. She uses a cane for support. 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  There has been no change in her leg weakness.  She continues to feel that at times, her foot will turn inwards.  Fortunately, she has not had any falls.   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  Myopathy is less likely given normal CK and no findings of myopathy on EMG.  EMG showed diffuse neurogenic changes involving the hip muscles without active denervation. MRI of the hip shows severe muscle atrophy of the iliopsoas, gluteus medius, and gluteus minimus along with paraspinal muscle atrophy.  There is no evidence of hyperreflexia or increased tone to suggest UMN pathology. Previously discussed additional testing such as CSF testing or muscle biopsy, and opted to monitor due to stability of symptoms.  PAIN:  Are you having pain? Yes: NPRS scale: 5/10 Pain location: low back Pain description: ache Aggravating factors: position changes Relieving factors: sitting   PRECAUTIONS: None  RED FLAGS: None   WEIGHT BEARING RESTRICTIONS: No  FALLS:  Has patient fallen in last 6 months? No  OCCUPATION: retired  PLOF:  Independent  PATIENT GOALS: To manage my low back pain  NEXT MD VISIT: TBD  OBJECTIVE:  Note: Objective measures were completed at Evaluation unless otherwise noted.  DIAGNOSTIC FINDINGS:  IMPRESSION: 1. 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 2. 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 3. Left lateral recess narrowing at L1-2 and L2-3, which could cause left L2 and L3  radiculopathy.     Electronically Signed   By: Franky Stanford M.D.   On: 03/17/2023 01:53  PATIENT SURVEYS:  Modified Oswestry:   Interpretation of scores: Score Category Description  0-20% Minimal Disability The patient can cope with most living activities. Usually no treatment is indicated apart from advice on lifting, sitting and exercise  21-40% Moderate Disability The patient experiences more pain and difficulty with sitting, lifting and standing. Travel and social life are more difficult and they may be disabled from work. Personal care, sexual activity and sleeping are not grossly affected, and the patient can usually be managed by conservative means  41-60% Severe Disability Pain remains the main problem in this group, but activities of daily living are affected. These patients require a detailed investigation  61-80% Crippled Back pain impinges on all aspects of the patient's life. Positive intervention is required  81-100% Bed-bound  These patients are either bed-bound or exaggerating their symptoms  Bluford FORBES Zoe DELENA Karon DELENA, et al. Surgery versus conservative management of stable thoracolumbar fracture: the PRESTO feasibility RCT. [place] ([place]): [employer]; 2021 Nov. [facility] Technology Assessment, No. 25.62.) Appendix 3, Oswestry Disability Index category descriptors. Available from: FindJewelers.cz  Minimally Clinically Important Difference (MCID) = 12.8%   MUSCLE LENGTH: Hamstrings: Right  70 deg; Left 70 deg   POSTURE: decreased lumbar lordosis and flexed hip posture, knee valgus   PALPATION: deferred  LUMBAR ROM:   AROM eval  Flexion 90%  Extension 10%  Right lateral flexion 50%  Left lateral flexion 50%  Right rotation   Left rotation    (Blank rows = not tested)  LOWER EXTREMITY ROM:   WFL throughout  Active  Right eval Left eval  Hip flexion    Hip extension    Hip abduction    Hip adduction    Hip internal rotation    Hip external rotation    Knee flexion    Knee extension    Ankle dorsiflexion    Ankle plantarflexion    Ankle inversion    Ankle eversion     (Blank rows = not tested)  LOWER EXTREMITY MMT:  see 30s chair stand test  MMT Right eval Left eval  Hip flexion    Hip extension    Hip abduction    Hip adduction    Hip internal rotation    Hip external rotation    Knee flexion    Knee extension    Ankle dorsiflexion    Ankle plantarflexion    Ankle inversion    Ankle eversion     (Blank rows = not tested)  LUMBAR SPECIAL TESTS:  Straight leg raise test: Negative and Slump test: Negative  FUNCTIONAL TESTS:  30 seconds chair stand test 5 reps  GAIT: Distance walked: 30ft x2 Assistive device utilized: Single point cane Level of assistance: Complete Independence Comments: slow cadence  TREATMENT:   [facility] Adult PT Treatment  05/26/2024:  Therapeutic Exercise:  nu-step L5 73m - LE only LTR Alternating clam - GTB - 2x10 Bridge from ball - 2x10 Hip adduction squeeze - 2x10 - 5'' hold Seated hip flexion with GTB - 3x10 ea  Therapeutic Activity  STS from raised table - 2x10 Standing hip abd Standing hip flexion      [facility] Adult PT Treatment:  DATE: 05/18/24 Therapeutic Exercise: Nustep L4 8 min  Neuromuscular re-ed:   05/18/24 0001  Berg Balance Test  Sit to Stand 4  Standing Unsupported 4  Sitting with Back Unsupported but Feet Supported on Floor or Stool 4   Stand to Sit 4  Transfers 4  Standing Unsupported with Eyes Closed 4  Standing Unsupported with Feet Together 4  From Standing, Reach Forward with Outstretched Arm 4  From Standing Position, Pick up Object from Floor 4  From Standing Position, Turn to Look Behind Over each Shoulder 4  Turn 360 Degrees 2  Standing Unsupported, Alternately Place Feet on Step/Stool 4  Standing Unsupported, One Foot in Front 3  Standing on One Leg 4  Total Score 53   Therapeutic Activity: 2 MWT 270ft with cane Supine hip fallouts GTB 15x B, 15/15 unilateral S/L clams GTB 15/15   [facility] Adult PT Treatment:                                                DATE: 05/17/24 Aquatic therapy at [facility]- [address] - therapeutic pool temp approximately 91 degrees. Pt enters building with SPC independently. Treatment took place in water 3.8 to  4 ft 8 in. deep depending upon activity.  Pt entered and exited the pool via stair and handrails independently with step to pattern. Patient entered water for aquatic therapy for first time and was introduced to principles and therapeutic effects of water as they ambulated and acclimated to pool.  Aquatic Exercise: Walking forward/backwards/side stepping x 2 laps ea Side stepping rainbow DB shoulder abd/add x2 laps black noodle pull down with focus on core activation 2x10 noodle thoracic rotation x1' Hip abd/add Squats Heel/toe raises Hip ext/flex with knee straight Hip Circles CW/CCW Step up fwd and lat  Pt requires the buoyancy of water for active assisted exercises with buoyancy supported for strengthening and AROM exercises. Hydrostatic pressure also supports joints by unweighting joint load by at least 50 % in 3-4 feet depth water. 80% in chest to neck deep water. Water will provide assistance with movement using the current and laminar flow while the buoyancy reduces weight bearing. Pt requires the viscosity of the water for resistance with strengthening  exercises.  [facility] Adult PT Treatment:                                                DATE: 05/11/24 Aquatic therapy at [facility]- [address] - therapeutic pool temp approximately 91 degrees. Pt enters building with SPC independently. Treatment took place in water 3.8 to  4 ft 8 in. deep depending upon activity.  Pt entered and exited the pool via stair and handrails independently with step to pattern. Patient entered water for aquatic therapy for first time and was introduced to principles and therapeutic effects of water as they ambulated and acclimated to pool.  Aquatic Exercise: Walking forward/backwards/side stepping x 2 laps ea Side stepping rainbow DB shoulder abd/add x2 laps Half noodle pull down with focus on core activation 2x10 Half noodle thoracic rotation x1' Standing with UE support edge of pool: Hip abd/add x20 BIL Squats x20 Heel/toe raises x20 Hip ext/flex with knee straight x 20 BIL Hip Circles CW/CCW x10  each BIL  Pt requires the buoyancy of water for active assisted exercises with buoyancy supported for strengthening and AROM exercises. Hydrostatic pressure also supports joints by unweighting joint load by at least 50 % in 3-4 feet depth water. 80% in chest to neck deep water. Water will provide assistance with movement using the current and laminar flow while the buoyancy reduces weight bearing. Pt requires the viscosity of the water for resistance with strengthening exercises.     [facility] Adult PT Treatment:                                                DATE: 05/08/24 Therapeutic Exercise: Nustep L3 8 min Neuromuscular re-ed: Supine hip fallouts GTB 10x B, 10/10 S/L clams GTB 10/10 Bridge against GTB 10x Bridge with ball squeeze 10x STS from airex pad Therapeutic Activity: Seated hamstring stretch 30s x2 B Supine QL stretch 30s x2 Supine march 15/15 with UE in 90d flexion P-ball curl ups 10x B, 10/10 unilateral  [facility] Adult PT Treatment:                                                 DATE: 05/05/24 Therapeutic Exercise: Nustep L2 8 min Neuromuscular re-ed: Supine hip fallouts RTB 15x B, 15/15 S/L clams RTB 15/15 Bridge against RTB 15x Bridge with ball squeeze 15x Therapeutic Activity: Seated hamstring stretch 30s x2 B Supine march 15/15 P-ball curl ups 15x                                                                                                                     PATIENT EDUCATION:  Education details: Discussed eval findings, rehab rationale and POC and patient is in agreement  Person educated: Patient Education method: Explanation and Handouts Education comprehension: verbalized understanding and needs further education  HOME EXERCISE PROGRAM: Access Code: [email or web address] [facility] URL: [email or web address] Date: 04/26/2024 Prepared by: Reyes Kohut  Exercises - Supine Bridge with Resistance Band  - 1-2 x daily - 5 x weekly - 3 sets - 10 reps - Hooklying Single Leg Bent Knee Fallouts with Resistance  - 1-2 x daily - 5 x weekly - 3 sets - 10 reps - Static Prone on Elbows  - 1-2 x daily - 5 x weekly - 1 sets - 2 min hold  ASSESSMENT:  CLINICAL IMPRESSION:   Adrien tolerated session well with no adverse reaction.  Low pain levels.  Significant L vs R hip weakness most apparent in hip flexion and abd.  Worked on OC and CC hip strengthening with pt noting significant fatigue but no increase in pain.  Cued for body position to reduce compensatory patterns in standing.    EVAL: Patient is a 83 y.o. female who was seen  today for physical therapy evaluation and treatment for chronic low back pain and limited mobility due to underlying degenerative changes including stenosis.  Imaging studies show atrophy of lumbopelvic musculature.  BLE ROM and flexibility is functional and no neural tension signs noted.  Patient is a good candidate for OPPT to include aquatic PT, core strengthening and lumbar spine flexibility tasks.  OBJECTIVE  IMPAIRMENTS: Abnormal gait, decreased activity tolerance, decreased coordination, decreased mobility, difficulty walking, decreased ROM, decreased strength, impaired perceived functional ability, improper body mechanics, postural dysfunction, and pain.   ACTIVITY LIMITATIONS: carrying, lifting, bending, standing, sleeping, transfers, and bed mobility  PERSONAL FACTORS: Age, Fitness, Past/current experiences, and Time since onset of injury/illness/exacerbation are also affecting patient's functional outcome.   REHAB POTENTIAL: Good  CLINICAL DECISION MAKING: Evolving/moderate complexity  EVALUATION COMPLEXITY: Moderate   GOALS: Goals reviewed with patient? No  SHORT TERM GOALS: Target date: 05/24/2024  Patient to demonstrate independence in HEP  Baseline: [email or web address] [facility] Goal status: INITIAL  2.  Assess 2 MWT  Baseline: TBD; 05/18/24 53/56 Goal status: Met  3.  Assess BERG for balance deficits Baseline: TBD; 05/18/24 53/56 Goal status: INITIAL  LONG TERM GOALS: Target date: 06/21/2024  Patient will acknowledge 4/10 pain at least once during episode of care   Baseline: 5/10 Goal status: INITIAL  2.  Patient will score at least 10/50 on ODI to signify clinically meaningful improvement in functional abilities.   Baseline: 16/50 Goal status: INITIAL  3.  Patient will increase 30s chair stand reps from 5 to 8 without arms to demonstrate and improved functional ability with less pain/difficulty as well as reduce fall risk.  Baseline: 5 Goal status: INITIAL  4.  Assess BERG goal as needed Baseline: TBD; no need Goal status: Met  5.  Assess 2 MWT for progress Baseline: TBD Goal status: INITIAL   PLAN:  PT FREQUENCY: 1-2x/week  PT DURATION: 6 weeks  PLANNED INTERVENTIONS: 97110-Therapeutic exercises, 97530- Therapeutic activity, [id number]- Neuromuscular re-education, 97535- Self Care, [id number]- Manual therapy, [id number]- Gait training, [phone]- Aquatic Therapy, [id number] (1-2 muscles), [id number]  (3+ muscles)- Dry Needling, Patient/Family education, Balance training, and Stair training.  PLAN FOR NEXT SESSION: HEP review and update, manual techniques as appropriate, aerobic tasks, ROM and flexibility activities, strengthening and PREs, TPDN, gait and balance training as needed     Helene FORBES Gasmen, PT 05/26/2024, 8:01 AM

## 2024-05-30 ENCOUNTER — Ambulatory Visit: Admitting: Physical Therapy

## 2024-05-30 ENCOUNTER — Encounter: Payer: Self-pay | Admitting: Physical Therapy

## 2024-05-30 DIAGNOSIS — M6281 Muscle weakness (generalized): Secondary | ICD-10-CM

## 2024-05-30 DIAGNOSIS — M5459 Other low back pain: Secondary | ICD-10-CM

## 2024-05-30 DIAGNOSIS — R2689 Other abnormalities of gait and mobility: Secondary | ICD-10-CM

## 2024-05-30 NOTE — Therapy (Signed)
 OUTPATIENT PHYSICAL THERAPY TREATMENT NOTE   Patient Name: Gwendolyn [name] MRN: [id number] DOB:May 28, 1941, 83 y.o., female Today's Date: 05/30/2024  END OF SESSION:  PT End of Session - 05/30/24 0800     Visit Number 8    Number of Visits 16    Date for Recertification  06/26/24    Authorization Type HTA    PT Start Time 0800    PT Stop Time 0841    PT Time Calculation (min) 41 min    Activity Tolerance Patient tolerated treatment well    Behavior During Therapy [facility] for tasks assessed/performed           Past Medical History:  Diagnosis Date   Allergy    Arthritis    Blood transfusion without reported diagnosis    Cataract    removed both eyes    Cervical radicular pain 02/11/2016   Glaucoma    low pressure glaucoma    Hyperlipidemia    Hypertension    Osteopenia    Parkinson's disease (HCC)    Pelvis fracture (HCC) 03/26/2002   both sides fx   Positive colorectal cancer screening using Cologuard test 04/14/2017   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    Vitamin D  deficiency    Past Surgical History:  Procedure Laterality Date   COLONOSCOPY     DILATION AND CURETTAGE OF UTERUS     age 46    right elbow   surgery  March 26, 2002   right hand ulner nerve surgery  March 26, 2002   I and d done right hand/wrist   right shoulder humerus fx  july 2003   surgery done   right total hip arthroplasty  2008   right total knee arthroplasty  sept 2012   right wrist plating  2004   TONSILLECTOMY  age 8   TOTAL HIP ARTHROPLASTY  10/22/2011   Procedure: TOTAL HIP ARTHROPLASTY ANTERIOR APPROACH;  Surgeon: Donnice JONETTA Car, MD;  Location: [facility];  Service: Orthopedics;  Laterality: Left;  Left Total Hip Arthroplasty,  Anterior Approach    TOTAL KNEE ARTHROPLASTY Left 04/28/2022   Procedure: TOTAL KNEE ARTHROPLASTY;  Surgeon: Car Donnice, MD;  Location: [facility];  Service: Orthopedics;  Laterality: Left;   Patient Active Problem List   Diagnosis Date Noted   S/P total  knee arthroplasty, 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   Parkinson's disease (HCC) 11/28/2021   B12 deficiency 05/28/2021   Cervical arthritis 09/27/2017   Open-angle glaucoma 05/14/2016   Generalized anxiety disorder 02/11/2016   Overweight (BMI 25.0-29.9) 04/25/2015   Medication management 02/12/2014   Essential hypertension 08/08/2013   Hyperlipidemia, mixed 08/08/2013   Abnormal glucose 08/08/2013   Vitamin D  deficiency 08/08/2013    PCP: Theophilus Andrews, Tully GRADE, MD   REFERRING PROVIDER: Patel, Donika K, DO  REFERRING DIAG: [phone] (ICD-10-CM) - Chronic bilateral low back pain without sciatica  Rationale for Evaluation and Treatment: Rehabilitation  THERAPY DIAG:  Other low back pain  Muscle weakness (generalized)  Other abnormalities of gait and mobility  ONSET DATE: chronic  SUBJECTIVE:  SUBJECTIVE STATEMENT: Pt reports that over the last few days her back has been doing well, despite the rain.   EVAL: In 2022, she recalls noticing that she began walking with a limp.  She has low back pain sometimes at the end of the day.  She starting using a cane around the same time.  She had left knee replacement in 2023 and continued to walk with a limp.  She is very active and continues to go to balance class and water exercise.  She noticed that if she sits too long, she has pressure in the low back.  She has weakness in the right leg and has some difficulty with stairs and has to manually lift her right leg to get it into her car.  She denies weakness in the right leg or arms.  No problems with speech/swallow.  No numbness/tingling of the arms or legs.     She is retired Engineer, civil (consulting) from the surgical center.  She worked full-time until she was 35. She was very independent and kept up with her yard work until 2 years  ago when her right leg started to get weak.  Fortunately, she has no falls except a mechanical one when she lost balance while trying to push a stake into the ground.     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 MRI lumbar spine showed left foraminal stenosis at L2-3 and L3-4.  Per my review, there is atrophy of the paraspinal muscles.  EMG shows neurogenic changes involving rectus femoris, adductor longus, iliopsoas, and gluteus medius muscles.  No evidence of myopathy or active denervation.     UPDATE 04/10/2024:  She has low back pain and achy pain on top of her feet which is worse with prolonged activity.  She gets relief with tramadol. She uses a cane for support. 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  There has been no change in her leg weakness.  She continues to feel that at times, her foot will turn inwards.  Fortunately, she has not had any falls.   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  Myopathy is less likely given normal CK and no findings of myopathy on EMG.  EMG showed diffuse neurogenic changes involving the hip muscles without active denervation. MRI of the hip shows severe muscle atrophy of the iliopsoas, gluteus medius, and gluteus minimus along with paraspinal muscle atrophy.  There is no evidence of hyperreflexia or increased tone to suggest UMN pathology. Previously discussed additional testing such as CSF testing or muscle biopsy, and opted to monitor due to stability of symptoms.  PAIN:  Are you having pain? Yes: NPRS scale: 5/10 Pain location: low back Pain description: ache Aggravating factors: position changes Relieving factors: sitting   PRECAUTIONS: None  RED FLAGS: None   WEIGHT BEARING RESTRICTIONS: No  FALLS:  Has patient fallen in last 6 months? No  OCCUPATION: retired  PLOF:  Independent  PATIENT GOALS: To manage my low back pain  NEXT MD VISIT: TBD  OBJECTIVE:  Note: Objective measures were completed at Evaluation unless otherwise noted.  DIAGNOSTIC FINDINGS:  IMPRESSION: 1. 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 2. 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 3. Left lateral recess narrowing at L1-2 and L2-3, which could cause left L2 and L3 radiculopathy.  Electronically Signed   By: Franky Stanford M.D.   On: 03/17/2023 01:53  PATIENT SURVEYS:  Modified Oswestry:   Interpretation of scores: Score Category Description  0-20% Minimal Disability The patient can cope with most living activities. Usually no treatment is indicated apart from advice on lifting, sitting and exercise  21-40% Moderate Disability The patient experiences more pain and difficulty with sitting, lifting and standing. Travel and social life are more difficult and they may be disabled from work. Personal care, sexual activity and sleeping are not grossly affected, and the patient can usually be managed by conservative means  41-60% Severe Disability Pain remains the main problem in this group, but activities of daily living are affected. These patients require a detailed investigation  61-80% Crippled Back pain impinges on all aspects of the patient's life. Positive intervention is required  81-100% Bed-bound  These patients are either bed-bound or exaggerating their symptoms  Bluford FORBES Zoe DELENA Karon DELENA, et al. Surgery versus conservative management of stable thoracolumbar fracture: the PRESTO feasibility RCT. [place] ([place]): [employer]; 2021 Nov. [facility] Technology Assessment, No. 25.62.) Appendix 3, Oswestry Disability Index category descriptors. Available from: FindJewel[employer].cz  Minimally Clinically Important Difference (MCID) = 12.8%   MUSCLE LENGTH: Hamstrings: Right  70 deg; Left 70 deg   POSTURE: decreased lumbar lordosis and flexed hip posture, knee valgus   PALPATION: deferred  LUMBAR ROM:   AROM eval  Flexion 90%  Extension 10%  Right lateral flexion 50%  Left lateral flexion 50%  Right rotation   Left rotation    (Blank rows = not tested)  LOWER EXTREMITY ROM:   WFL throughout  Active  Right eval Left eval  Hip flexion    Hip extension    Hip abduction    Hip adduction    Hip internal rotation    Hip external rotation    Knee flexion    Knee extension    Ankle dorsiflexion    Ankle plantarflexion    Ankle inversion    Ankle eversion     (Blank rows = not tested)  LOWER EXTREMITY MMT:  see 30s chair stand test  MMT Right eval Left eval  Hip flexion    Hip extension    Hip abduction    Hip adduction    Hip internal rotation    Hip external rotation    Knee flexion    Knee extension    Ankle dorsiflexion    Ankle plantarflexion    Ankle inversion    Ankle eversion     (Blank rows = not tested)  LUMBAR SPECIAL TESTS:  Straight leg raise test: Negative and Slump test: Negative  FUNCTIONAL TESTS:  30 seconds chair stand test 5 reps  GAIT: Distance walked: 22ft x2 Assistive device utilized: Single point cane Level of assistance: Complete Independence Comments: slow cadence  TREATMENT:   [facility] Adult PT Treatment  05/30/2024:  Therapeutic Exercise:  nu-step L6 56m - LE only LTR S/L clam - GTB - 3x10 Bridge from ball - 2x10 Hip adduction squeeze pilates - 2x10 - 5'' hold Seated hip flexion with 4# - 3x10 ea LAQ - 3x10 ea - 4#  Therapeutic Activity  STS from raised table - 4x5 - 10# Hurdle walking      [facility] Adult PT Treatment:  DATE: 05/18/24 Therapeutic Exercise: Nustep L4 8 min  Neuromuscular re-ed:   05/18/24 0001  Berg Balance Test  Sit to Stand 4  Standing Unsupported 4  Sitting with Back Unsupported but Feet Supported on Floor or Stool 4   Stand to Sit 4  Transfers 4  Standing Unsupported with Eyes Closed 4  Standing Unsupported with Feet Together 4  From Standing, Reach Forward with Outstretched Arm 4  From Standing Position, Pick up Object from Floor 4  From Standing Position, Turn to Look Behind Over each Shoulder 4  Turn 360 Degrees 2  Standing Unsupported, Alternately Place Feet on Step/Stool 4  Standing Unsupported, One Foot in Front 3  Standing on One Leg 4  Total Score 53   Therapeutic Activity: 2 MWT 266ft with cane Supine hip fallouts GTB 15x B, 15/15 unilateral S/L clams GTB 15/15   [facility] Adult PT Treatment:                                                DATE: 05/17/24 Aquatic therapy at [facility]- [address] - therapeutic pool temp approximately 91 degrees. Pt enters building with SPC independently. Treatment took place in water 3.8 to  4 ft 8 in. deep depending upon activity.  Pt entered and exited the pool via stair and handrails independently with step to pattern. Patient entered water for aquatic therapy for first time and was introduced to principles and therapeutic effects of water as they ambulated and acclimated to pool.  Aquatic Exercise: Walking forward/backwards/side stepping x 2 laps ea Side stepping rainbow DB shoulder abd/add x2 laps black noodle pull down with focus on core activation 2x10 noodle thoracic rotation x1' Hip abd/add Squats Heel/toe raises Hip ext/flex with knee straight Hip Circles CW/CCW Step up fwd and lat  Pt requires the buoyancy of water for active assisted exercises with buoyancy supported for strengthening and AROM exercises. Hydrostatic pressure also supports joints by unweighting joint load by at least 50 % in 3-4 feet depth water. 80% in chest to neck deep water. Water will provide assistance with movement using the current and laminar flow while the buoyancy reduces weight bearing. Pt requires the viscosity of the water for resistance with strengthening  exercises.  [facility] Adult PT Treatment:                                                DATE: 05/11/24 Aquatic therapy at [facility]- [address] - therapeutic pool temp approximately 91 degrees. Pt enters building with SPC independently. Treatment took place in water 3.8 to  4 ft 8 in. deep depending upon activity.  Pt entered and exited the pool via stair and handrails independently with step to pattern. Patient entered water for aquatic therapy for first time and was introduced to principles and therapeutic effects of water as they ambulated and acclimated to pool.  Aquatic Exercise: Walking forward/backwards/side stepping x 2 laps ea Side stepping rainbow DB shoulder abd/add x2 laps Half noodle pull down with focus on core activation 2x10 Half noodle thoracic rotation x1' Standing with UE support edge of pool: Hip abd/add x20 BIL Squats x20 Heel/toe raises x20 Hip ext/flex with knee straight x 20 BIL Hip Circles CW/CCW x10  each BIL  Pt requires the buoyancy of water for active assisted exercises with buoyancy supported for strengthening and AROM exercises. Hydrostatic pressure also supports joints by unweighting joint load by at least 50 % in 3-4 feet depth water. 80% in chest to neck deep water. Water will provide assistance with movement using the current and laminar flow while the buoyancy reduces weight bearing. Pt requires the viscosity of the water for resistance with strengthening exercises.     [facility] Adult PT Treatment:                                                DATE: 05/08/24 Therapeutic Exercise: Nustep L3 8 min Neuromuscular re-ed: Supine hip fallouts GTB 10x B, 10/10 S/L clams GTB 10/10 Bridge against GTB 10x Bridge with ball squeeze 10x STS from airex pad Therapeutic Activity: Seated hamstring stretch 30s x2 B Supine QL stretch 30s x2 Supine march 15/15 with UE in 90d flexion P-ball curl ups 10x B, 10/10 unilateral  [facility] Adult PT Treatment:                                                 DATE: 05/05/24 Therapeutic Exercise: Nustep L2 8 min Neuromuscular re-ed: Supine hip fallouts RTB 15x B, 15/15 S/L clams RTB 15/15 Bridge against RTB 15x Bridge with ball squeeze 15x Therapeutic Activity: Seated hamstring stretch 30s x2 B Supine march 15/15 P-ball curl ups 15x                                                                                                                     PATIENT EDUCATION:  Education details: Discussed eval findings, rehab rationale and POC and patient is in agreement  Person educated: Patient Education method: Explanation and Handouts Education comprehension: verbalized understanding and needs further education  HOME EXERCISE PROGRAM: Access Code: [email or web address] URL: [email or web address] Date: 05/30/2024 Prepared by: Helene Gasmen  Exercises - Supine Bridge with Resistance Band  - 1-2 x daily - 5 x weekly - 3 sets - 10 reps - Hooklying Single Leg Bent Knee Fallouts with Resistance  - 1-2 x daily - 5 x weekly - 3 sets - 10 reps - Static Prone on Elbows  - 1-2 x daily - 5 x weekly - 1 sets - 2 min hold - Sit to Stand Without Arm Support  - 1 x daily - 4-7 x weekly - 3 sets - 5-10 reps - Standing March with Counter Support  - 1 x daily - 7 x weekly - 2 sets - 10 reps  ASSESSMENT:  CLINICAL IMPRESSION:   Trinady tolerated session well with no adverse reaction.  Pt reports lower pain levels over the last few days.  Continued working on core and  hip strength with improved tolerance and reduced fatigue.  Pt able to clear yoga block during step over initially but with fatigues circumducts LE and requires cuing for form.  Updated HEP.    EVAL: Patient is a 83 y.o. female who was seen today for physical therapy evaluation and treatment for chronic low back pain and limited mobility due to underlying degenerative changes including stenosis.  Imaging studies show atrophy of lumbopelvic musculature.  BLE ROM and  flexibility is functional and no neural tension signs noted.  Patient is a good candidate for OPPT to include aquatic PT, core strengthening and lumbar spine flexibility tasks.  OBJECTIVE IMPAIRMENTS: Abnormal gait, decreased activity tolerance, decreased coordination, decreased mobility, difficulty walking, decreased ROM, decreased strength, impaired perceived functional ability, improper body mechanics, postural dysfunction, and pain.   ACTIVITY LIMITATIONS: carrying, lifting, bending, standing, sleeping, transfers, and bed mobility  PERSONAL FACTORS: Age, Fitness, Past/current experiences, and Time since onset of injury/illness/exacerbation are also affecting patient's functional outcome.   REHAB POTENTIAL: Good  CLINICAL DECISION MAKING: Evolving/moderate complexity  EVALUATION COMPLEXITY: Moderate   GOALS: Goals reviewed with patient? No  SHORT TERM GOALS: Target date: 05/24/2024  Patient to demonstrate independence in HEP  Baseline: F9MG [facility] Goal status: INITIAL  2.  Assess 2 MWT  Baseline: TBD; 05/18/24 53/56 Goal status: Met  3.  Assess BERG for balance deficits Baseline: TBD; 05/18/24 53/56 Goal status: INITIAL  LONG TERM GOALS: Target date: 06/21/2024  Patient will acknowledge 4/10 pain at least once during episode of care   Baseline: 5/10 Goal status: INITIAL  2.  Patient will score at least 10/50 on ODI to signify clinically meaningful improvement in functional abilities.   Baseline: 16/50 Goal status: INITIAL  3.  Patient will increase 30s chair stand reps from 5 to 8 without arms to demonstrate and improved functional ability with less pain/difficulty as well as reduce fall risk.  Baseline: 5 Goal status: INITIAL  4.  Assess BERG goal as needed Baseline: TBD; no need Goal status: Met  5.  Assess 2 MWT for progress Baseline: TBD Goal status: INITIAL   PLAN:  PT FREQUENCY: 1-2x/week  PT DURATION: 6 weeks  PLANNED INTERVENTIONS: [id number]-Therapeutic  exercises, 97530- Therapeutic activity, [id number]- Neuromuscular re-education, 97535- Self Care, [id number]- Manual therapy, [id number]- Gait training, [phone]- Aquatic Therapy, [phone] (1-2 muscles), [phone] (3+ muscles)- Dry Needling, Patient/Family education, Balance training, and Stair training.  PLAN FOR NEXT SESSION: HEP review and update, manual techniques as appropriate, aerobic tasks, ROM and flexibility activities, strengthening and PREs, TPDN, gait and balance training as needed     Helene FORBES Gasmen, PT 05/30/2024, 8:44 AM

## 2024-06-02 NOTE — Therapy (Signed)
 OUTPATIENT PHYSICAL THERAPY TREATMENT NOTE   Patient Name: ASHLEEN DEMMA MRN: [id number] DOB:09/03/1940, 83 y.o., female Today's Date: 06/05/2024  END OF SESSION:  PT End of Session - 06/05/24 1619     Visit Number 9    Number of Visits 16    Date for Recertification  06/26/24    Authorization Type HTA    PT Start Time 1615    PT Stop Time 1700    PT Time Calculation (min) 45 min    Activity Tolerance Patient tolerated treatment well    Behavior During Therapy [facility] for tasks assessed/performed            Past Medical History:  Diagnosis Date   Allergy    Arthritis    Blood transfusion without reported diagnosis    Cataract    removed both eyes    Cervical radicular pain 02/11/2016   Glaucoma    low pressure glaucoma    Hyperlipidemia    Hypertension    Osteopenia    Parkinson's disease (HCC)    Pelvis fracture (HCC) 03/26/2002   both sides fx   Positive colorectal cancer screening using Cologuard test 04/14/2017   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    Vitamin D  deficiency    Past Surgical History:  Procedure Laterality Date   COLONOSCOPY     DILATION AND CURETTAGE OF UTERUS     age 27    right elbow   surgery  March 26, 2002   right hand ulner nerve surgery  March 26, 2002   I and d done right hand/wrist   right shoulder humerus fx  july 2003   surgery done   right total hip arthroplasty  2008   right total knee arthroplasty  sept 2012   right wrist plating  2004   TONSILLECTOMY  age 79   TOTAL HIP ARTHROPLASTY  10/22/2011   Procedure: TOTAL HIP ARTHROPLASTY ANTERIOR APPROACH;  Surgeon: Donnice JONETTA Car, MD;  Location: [facility];  Service: Orthopedics;  Laterality: Left;  Left Total Hip Arthroplasty,  Anterior Approach    TOTAL KNEE ARTHROPLASTY Left 04/28/2022   Procedure: TOTAL KNEE ARTHROPLASTY;  Surgeon: Car Donnice, MD;  Location: [facility];  Service: Orthopedics;  Laterality: Left;   Patient Active Problem List   Diagnosis Date Noted   S/P  total knee arthroplasty, 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   Parkinson's disease (HCC) 11/28/2021   B12 deficiency 05/28/2021   Cervical arthritis 09/27/2017   Open-angle glaucoma 05/14/2016   Generalized anxiety disorder 02/11/2016   Overweight (BMI 25.0-29.9) 04/25/2015   Medication management 02/12/2014   Essential hypertension 08/08/2013   Hyperlipidemia, mixed 08/08/2013   Abnormal glucose 08/08/2013   Vitamin D  deficiency 08/08/2013    PCP: Theophilus Andrews, Tully GRADE, MD   REFERRING PROVIDER: Patel, Donika K, DO  REFERRING DIAG: [phone] (ICD-10-CM) - Chronic bilateral low back pain without sciatica  Rationale for Evaluation and Treatment: Rehabilitation  THERAPY DIAG:  Other low back pain  Muscle weakness (generalized)  Other abnormalities of gait and mobility  ONSET DATE: chronic  SUBJECTIVE:  SUBJECTIVE STATEMENT: Symptoms improving but also feels she overdoes it at times by not pacing activity which includes laundry and duties as a caregiver.   EVAL: In 2022, she recalls noticing that she began walking with a limp.  She has low back pain sometimes at the end of the day.  She starting using a cane around the same time.  She had left knee replacement in 2023 and continued to walk with a limp.  She is very active and continues to go to balance class and water exercise.  She noticed that if she sits too long, she has pressure in the low back.  She has weakness in the right leg and has some difficulty with stairs and has to manually lift her right leg to get it into her car.  She denies weakness in the right leg or arms.  No problems with speech/swallow.  No numbness/tingling of the arms or legs.     She is retired Engineer, civil (consulting) from the surgical center.  She worked full-time until she was 49. She was very  independent and kept up with her yard work until 2 years ago when her right leg started to get weak.  Fortunately, she has no falls except a mechanical one when she lost balance while trying to push a stake into the ground.     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 MRI lumbar spine showed left foraminal stenosis at L2-3 and L3-4.  Per my review, there is atrophy of the paraspinal muscles.  EMG shows neurogenic changes involving rectus femoris, adductor longus, iliopsoas, and gluteus medius muscles.  No evidence of myopathy or active denervation.     UPDATE 04/10/2024:  She has low back pain and achy pain on top of her feet which is worse with prolonged activity.  She gets relief with tramadol. She uses a cane for support. 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  There has been no change in her leg weakness.  She continues to feel that at times, her foot will turn inwards.  Fortunately, she has not had any falls.   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  Myopathy is less likely given normal CK and no findings of myopathy on EMG.  EMG showed diffuse neurogenic changes involving the hip muscles without active denervation. MRI of the hip shows severe muscle atrophy of the iliopsoas, gluteus medius, and gluteus minimus along with paraspinal muscle atrophy.  There is no evidence of hyperreflexia or increased tone to suggest UMN pathology. Previously discussed additional testing such as CSF testing or muscle biopsy, and opted to monitor due to stability of symptoms.  PAIN:  Are you having pain? Yes: NPRS scale: 5/10 Pain location: low back Pain description: ache Aggravating factors: position changes Relieving factors: sitting   PRECAUTIONS: None  RED FLAGS: None   WEIGHT BEARING RESTRICTIONS: No  FALLS:  Has patient fallen in last 6  months? No  OCCUPATION: retired  PLOF: Independent  PATIENT GOALS: To manage my low back pain  NEXT MD VISIT: TBD  OBJECTIVE:  Note: Objective measures were completed at Evaluation unless otherwise noted.  DIAGNOSTIC FINDINGS:  IMPRESSION: 1. 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 2. 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 3. Left lateral recess narrowing at L1-2 and L2-3, which could cause left L2  and L3 radiculopathy.     Electronically Signed   By: Franky Stanford M.D.   On: 03/17/2023 01:53  PATIENT SURVEYS:  Modified Oswestry:   Interpretation of scores: Score Category Description  0-20% Minimal Disability The patient can cope with most living activities. Usually no treatment is indicated apart from advice on lifting, sitting and exercise  21-40% Moderate Disability The patient experiences more pain and difficulty with sitting, lifting and standing. Travel and social life are more difficult and they may be disabled from work. Personal care, sexual activity and sleeping are not grossly affected, and the patient can usually be managed by conservative means  41-60% Severe Disability Pain remains the main problem in this group, but activities of daily living are affected. These patients require a detailed investigation  61-80% Crippled Back pain impinges on all aspects of the patient's life. Positive intervention is required  81-100% Bed-bound  These patients are either bed-bound or exaggerating their symptoms  Bluford FORBES Zoe DELENA Karon DELENA, et al. Surgery versus conservative management of stable thoracolumbar fracture: the PRESTO feasibility RCT. [place] ([place]): [employer]; 2021 Nov. [facility] Technology Assessment, No. 25.62.) Appendix 3, Oswestry Disability Index category descriptors. Available from: FindJewel[employer].cz  Minimally Clinically Important Difference (MCID) =  12.8%   MUSCLE LENGTH: Hamstrings: Right 70 deg; Left 70 deg   POSTURE: decreased lumbar lordosis and flexed hip posture, knee valgus   PALPATION: deferred  LUMBAR ROM:   AROM eval  Flexion 90%  Extension 10%  Right lateral flexion 50%  Left lateral flexion 50%  Right rotation   Left rotation    (Blank rows = not tested)  LOWER EXTREMITY ROM:   WFL throughout  Active  Right eval Left eval  Hip flexion    Hip extension    Hip abduction    Hip adduction    Hip internal rotation    Hip external rotation    Knee flexion    Knee extension    Ankle dorsiflexion    Ankle plantarflexion    Ankle inversion    Ankle eversion     (Blank rows = not tested)  LOWER EXTREMITY MMT:  see 30s chair stand test  MMT Right eval Left eval  Hip flexion    Hip extension    Hip abduction    Hip adduction    Hip internal rotation    Hip external rotation    Knee flexion    Knee extension    Ankle dorsiflexion    Ankle plantarflexion    Ankle inversion    Ankle eversion     (Blank rows = not tested)  LUMBAR SPECIAL TESTS:  Straight leg raise test: Negative and Slump test: Negative  FUNCTIONAL TESTS:  30 seconds chair stand test 5 reps  GAIT: Distance walked: 43ft x2 Assistive device utilized: Single point cane Level of assistance: Complete Independence Comments: slow cadence  TREATMENT:  [facility] Adult PT Treatment:                                                DATE: 06/05/24 Therapeutic Exercise: Nustep L4 8 min Neuromuscular re-ed: Supine core ball press B 3s 10x 10/10 unilateral FAQs with adduction 15x STS from airex pad 10x cuing for mechanics Therapeutic Activity: Seated hamstring stretch 30s x2 B Supine hip fallouts GTB 15x B, 15/15 S/L clams GTB 15/15  [facility] Adult PT Treatment  05/30/2024:  Therapeutic Exercise:  nu-step L6 51m - LE only LTR S/L clam - GTB - 3x10 Bridge from ball - 2x10 Hip adduction squeeze pilates - 2x10 - 5'' hold Seated hip  flexion with 4# - 3x10 ea LAQ - 3x10 ea - 4#  Therapeutic Activity  STS from raised table - 4x5 - 10# Hurdle walking      [facility] Adult PT Treatment:                                                DATE: 05/18/24 Therapeutic Exercise: Nustep L4 8 min  Neuromuscular re-ed:   05/18/24 0001  Berg Balance Test  Sit to Stand 4  Standing Unsupported 4  Sitting with Back Unsupported but Feet Supported on Floor or Stool 4  Stand to Sit 4  Transfers 4  Standing Unsupported with Eyes Closed 4  Standing Unsupported with Feet Together 4  From Standing, Reach Forward with Outstretched Arm 4  From Standing Position, Pick up Object from Floor 4  From Standing Position, Turn to Look Behind Over each Shoulder 4  Turn 360 Degrees 2  Standing Unsupported, Alternately Place Feet on Step/Stool 4  Standing Unsupported, One Foot in Front 3  Standing on One Leg 4  Total Score 53   Therapeutic Activity: 2 MWT 235ft with cane Supine hip fallouts GTB 15x B, 15/15 unilateral S/L clams GTB 15/15   [facility] Adult PT Treatment:                                                DATE: 05/17/24 Aquatic therapy at [facility]- [address] - therapeutic pool temp approximately 91 degrees. Pt enters building with SPC independently. Treatment took place in water 3.8 to  4 ft 8 in. deep depending upon activity.  Pt entered and exited the pool via stair and handrails independently with step to pattern. Patient entered water for aquatic therapy for first time and was introduced to principles and therapeutic effects of water as they ambulated and acclimated to pool.  Aquatic Exercise: Walking forward/backwards/side stepping x 2 laps ea Side stepping rainbow DB shoulder abd/add x2 laps black noodle pull down with focus on core activation 2x10 noodle thoracic rotation x1' Hip abd/add Squats Heel/toe raises Hip ext/flex with knee straight Hip Circles CW/CCW Step up fwd and lat  Pt requires the buoyancy of water  for active assisted exercises with buoyancy supported for strengthening and AROM exercises. Hydrostatic pressure also supports joints by unweighting joint load by at least 50 % in 3-4 feet depth water. 80% in chest to neck deep water. Water will provide assistance with movement using the current and laminar flow while the buoyancy reduces weight bearing. Pt requires the viscosity of the water for resistance with strengthening exercises.  [facility] Adult PT Treatment:                                                DATE: 05/11/24 Aquatic therapy at [facility]- [address] - therapeutic pool temp approximately 91 degrees. Pt enters building with  SPC independently. Treatment took place in water 3.8 to  4 ft 8 in. deep depending upon activity.  Pt entered and exited the pool via stair and handrails independently with step to pattern. Patient entered water for aquatic therapy for first time and was introduced to principles and therapeutic effects of water as they ambulated and acclimated to pool.  Aquatic Exercise: Walking forward/backwards/side stepping x 2 laps ea Side stepping rainbow DB shoulder abd/add x2 laps Half noodle pull down with focus on core activation 2x10 Half noodle thoracic rotation x1' Standing with UE support edge of pool: Hip abd/add x20 BIL Squats x20 Heel/toe raises x20 Hip ext/flex with knee straight x 20 BIL Hip Circles CW/CCW x10 each BIL  Pt requires the buoyancy of water for active assisted exercises with buoyancy supported for strengthening and AROM exercises. Hydrostatic pressure also supports joints by unweighting joint load by at least 50 % in 3-4 feet depth water. 80% in chest to neck deep water. Water will provide assistance with movement using the current and laminar flow while the buoyancy reduces weight bearing. Pt requires the viscosity of the water for resistance with strengthening exercises.     [facility] Adult PT Treatment:                                                 DATE: 05/08/24 Therapeutic Exercise: Nustep L3 8 min Neuromuscular re-ed: Supine hip fallouts GTB 10x B, 10/10 S/L clams GTB 10/10 Bridge against GTB 10x Bridge with ball squeeze 10x STS from airex pad Therapeutic Activity: Seated hamstring stretch 30s x2 B Supine QL stretch 30s x2 Supine march 15/15 with UE in 90d flexion P-ball curl ups 10x B, 10/10 unilateral  [facility] Adult PT Treatment:                                                DATE: 05/05/24 Therapeutic Exercise: Nustep L2 8 min Neuromuscular re-ed: Supine hip fallouts RTB 15x B, 15/15 S/L clams RTB 15/15 Bridge against RTB 15x Bridge with ball squeeze 15x Therapeutic Activity: Seated hamstring stretch 30s x2 B Supine march 15/15 P-ball curl ups 15x                                                                                                                     PATIENT EDUCATION:  Education details: Discussed eval findings, rehab rationale and POC and patient is in agreement  Person educated: Patient Education method: Explanation and Handouts Education comprehension: verbalized understanding and needs further education  HOME EXERCISE PROGRAM: Access Code: [email or web address] URL: [email or web address] Date: 05/30/2024 Prepared by: Helene Gasmen  Exercises - Supine Bridge with Resistance Band  - 1-2 x daily - 5 x weekly - 3 sets - 10  reps - Hooklying Single Leg Bent Knee Fallouts with Resistance  - 1-2 x daily - 5 x weekly - 3 sets - 10 reps - Static Prone on Elbows  - 1-2 x daily - 5 x weekly - 1 sets - 2 min hold - Sit to Stand Without Arm Support  - 1 x daily - 4-7 x weekly - 3 sets - 5-10 reps - Standing March with Counter Support  - 1 x daily - 7 x weekly - 2 sets - 10 reps  ASSESSMENT:  CLINICAL IMPRESSION: Focus of session was continued lumbosacral and hip strengthening, adding additional core tasks as tolerated.  Incorporated STS from airex pad to challenge body mechanics and discourage valgus  collapse.     EVAL: Patient is a 83 y.o. female who was seen today for physical therapy evaluation and treatment for chronic low back pain and limited mobility due to underlying degenerative changes including stenosis.  Imaging studies show atrophy of lumbopelvic musculature.  BLE ROM and flexibility is functional and no neural tension signs noted.  Patient is a good candidate for OPPT to include aquatic PT, core strengthening and lumbar spine flexibility tasks.  OBJECTIVE IMPAIRMENTS: Abnormal gait, decreased activity tolerance, decreased coordination, decreased mobility, difficulty walking, decreased ROM, decreased strength, impaired perceived functional ability, improper body mechanics, postural dysfunction, and pain.   ACTIVITY LIMITATIONS: carrying, lifting, bending, standing, sleeping, transfers, and bed mobility  PERSONAL FACTORS: Age, Fitness, Past/current experiences, and Time since onset of injury/illness/exacerbation are also affecting patient's functional outcome.   REHAB POTENTIAL: Good  CLINICAL DECISION MAKING: Evolving/moderate complexity  EVALUATION COMPLEXITY: Moderate   GOALS: Goals reviewed with patient? No  SHORT TERM GOALS: Target date: 05/24/2024  Patient to demonstrate independence in HEP  Baseline: F9MG [facility] Goal status: INITIAL  2.  Assess 2 MWT  Baseline: TBD; 05/18/24 53/56 Goal status: Met  3.  Assess BERG for balance deficits Baseline: TBD; 05/18/24 53/56 Goal status: INITIAL  LONG TERM GOALS: Target date: 06/21/2024  Patient will acknowledge 4/10 pain at least once during episode of care   Baseline: 5/10 Goal status: INITIAL  2.  Patient will score at least 10/50 on ODI to signify clinically meaningful improvement in functional abilities.   Baseline: 16/50 Goal status: INITIAL  3.  Patient will increase 30s chair stand reps from 5 to 8 without arms to demonstrate and improved functional ability with less pain/difficulty as well as reduce fall  risk.  Baseline: 5 Goal status: INITIAL  4.  Assess BERG goal as needed Baseline: TBD; no need Goal status: Met  5.  Assess 2 MWT for progress Baseline: TBD Goal status: INITIAL   PLAN:  PT FREQUENCY: 1-2x/week  PT DURATION: 6 weeks  PLANNED INTERVENTIONS: 97110-Therapeutic exercises, 97530- Therapeutic activity, [id number]- Neuromuscular re-education, 97535- Self Care, [id number]- Manual therapy, [id number]- Gait training, [phone]- Aquatic Therapy, [phone] (1-2 muscles), [id number] (3+ muscles)- Dry Needling, Patient/Family education, Balance training, and Stair training.  PLAN FOR NEXT SESSION: HEP review and update, manual techniques as appropriate, aerobic tasks, ROM and flexibility activities, strengthening and PREs, TPDN, gait and balance training as needed     Reyes CHRISTELLA Kohut, PT 06/05/2024, 5:16 PM

## 2024-06-05 ENCOUNTER — Ambulatory Visit: Attending: Neurology

## 2024-06-05 DIAGNOSIS — M6281 Muscle weakness (generalized): Secondary | ICD-10-CM | POA: Insufficient documentation

## 2024-06-05 DIAGNOSIS — M5459 Other low back pain: Secondary | ICD-10-CM | POA: Diagnosis not present

## 2024-06-05 DIAGNOSIS — R2689 Other abnormalities of gait and mobility: Secondary | ICD-10-CM | POA: Diagnosis not present

## 2024-06-07 ENCOUNTER — Ambulatory Visit: Payer: Medicare HMO | Admitting: Nurse Practitioner

## 2024-06-07 ENCOUNTER — Ambulatory Visit

## 2024-06-07 DIAGNOSIS — M6281 Muscle weakness (generalized): Secondary | ICD-10-CM

## 2024-06-07 DIAGNOSIS — M5459 Other low back pain: Secondary | ICD-10-CM

## 2024-06-07 DIAGNOSIS — R2689 Other abnormalities of gait and mobility: Secondary | ICD-10-CM

## 2024-06-07 NOTE — Therapy (Signed)
 OUTPATIENT PHYSICAL THERAPY TREATMENT NOTE   Patient Name: Gwendolyn [name] MRN: [id number] DOB:1941/04/10, 83 y.o., female Today's Date: 06/07/2024  END OF SESSION:  PT End of Session - 06/07/24 1535     Visit Number 10    Number of Visits 16    Date for Recertification  06/26/24    Authorization Type HTA    PT Start Time 1530    PT Stop Time 1610    PT Time Calculation (min) 40 min    Activity Tolerance Patient tolerated treatment well    Behavior During Therapy [facility] for tasks assessed/performed             Past Medical History:  Diagnosis Date   Allergy    Arthritis    Blood transfusion without reported diagnosis    Cataract    removed both eyes    Cervical radicular pain 02/11/2016   Glaucoma    low pressure glaucoma    Hyperlipidemia    Hypertension    Osteopenia    Parkinson's disease (HCC)    Pelvis fracture (HCC) 03/26/2002   both sides fx   Positive colorectal cancer screening using Cologuard test 04/14/2017   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    Vitamin D  deficiency    Past Surgical History:  Procedure Laterality Date   COLONOSCOPY     DILATION AND CURETTAGE OF UTERUS     age 38    right elbow   surgery  March 26, 2002   right hand ulner nerve surgery  March 26, 2002   I and d done right hand/wrist   right shoulder humerus fx  july 2003   surgery done   right total hip arthroplasty  2008   right total knee arthroplasty  sept 2012   right wrist plating  2004   TONSILLECTOMY  age 12   TOTAL HIP ARTHROPLASTY  10/22/2011   Procedure: TOTAL HIP ARTHROPLASTY ANTERIOR APPROACH;  Surgeon: Gwendolyn JONETTA Car, MD;  Location: [facility];  Service: Orthopedics;  Laterality: Left;  Left Total Hip Arthroplasty,  Anterior Approach    TOTAL KNEE ARTHROPLASTY Left 04/28/2022   Procedure: TOTAL KNEE ARTHROPLASTY;  Surgeon: [name] Donnice, MD;  Location: [facility];  Service: Orthopedics;  Laterality: Left;   Patient Active Problem List   Diagnosis Date Noted   S/P  total knee arthroplasty, 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   Parkinson's disease (HCC) 11/28/2021   B12 deficiency 05/28/2021   Cervical arthritis 09/27/2017   Open-angle glaucoma 05/14/2016   Generalized anxiety disorder 02/11/2016   Overweight (BMI 25.0-29.9) 04/25/2015   Medication management 02/12/2014   Essential hypertension 08/08/2013   Hyperlipidemia, mixed 08/08/2013   Abnormal glucose 08/08/2013   Vitamin D  deficiency 08/08/2013    PCP: Gwendolyn [name], Tully GRADE, MD   REFERRING PROVIDER: Patel, Gwendolyn [name]  REFERRING DIAG: [phone] (ICD-10-CM) - Chronic bilateral low back pain without sciatica  Rationale for Evaluation and Treatment: Rehabilitation  THERAPY DIAG:  Other low back pain  Muscle weakness (generalized)  Other abnormalities of gait and mobility  ONSET DATE: chronic  SUBJECTIVE:  SUBJECTIVE STATEMENT: No new c/o.  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  She has low back pain sometimes at the end of the day.  She starting using a cane around the same time.  She had left knee replacement in 2023 and continued to walk with a limp.  She is very active and continues to go to balance class and water exercise.  She noticed that if she sits too long, she has pressure in the low back.  She has weakness in the right leg and has some difficulty with stairs and has to manually lift her right leg to get it into her [name].  She denies weakness in the right leg or arms.  No problems with speech/swallow.  No numbness/tingling of the arms or legs.     She is retired Engineer, civil (consulting) from the surgical center.  She worked full-time until she was 74. She was very independent and kept up with her yard work until 2  years ago when her right leg started to get weak.  Fortunately, she has no falls except a mechanical one when she lost balance while trying to push a stake into the ground.     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 MRI lumbar spine showed left foraminal stenosis at L2-3 and L3-4.  Per my review, there is atrophy of the paraspinal muscles.  EMG shows neurogenic changes involving rectus femoris, adductor longus, iliopsoas, and gluteus medius muscles.  No evidence of myopathy or active denervation.     UPDATE 04/10/2024:  She has low back pain and achy pain on top of her feet which is worse with prolonged activity.  She gets relief with tramadol. She uses a cane for support. 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  There has been no change in her leg weakness.  She continues to feel that at times, her foot will turn inwards.  Fortunately, she has not had any falls.   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  Myopathy is less likely given normal CK and no findings of myopathy on EMG.  EMG showed diffuse neurogenic changes involving the hip muscles without active denervation. MRI of the hip shows severe muscle atrophy of the iliopsoas, gluteus medius, and gluteus minimus along with paraspinal muscle atrophy.  There is no evidence of hyperreflexia or increased tone to suggest UMN pathology. Previously discussed additional testing such as CSF testing or muscle biopsy, and opted to monitor due to stability of symptoms.  PAIN:  Are you having pain? Yes: NPRS scale: 5/10 Pain location: low back Pain description: ache Aggravating factors: position changes Relieving factors: sitting   PRECAUTIONS: None  RED FLAGS: None   WEIGHT BEARING RESTRICTIONS: No  FALLS:  Has patient fallen in last 6 months? No  OCCUPATION: retired  PLOF:  Independent  PATIENT GOALS: To manage my low back pain  NEXT MD VISIT: TBD  OBJECTIVE:  Note: Objective measures were completed at Evaluation unless otherwise noted.  DIAGNOSTIC FINDINGS:  IMPRESSION: 1. 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 2. 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 3. Left lateral recess narrowing at L1-2 and L2-3, which could cause left L2 and L3 radiculopathy.  Electronically Signed   By: Franky Stanford M.D.   On: 03/17/2023 01:53  PATIENT SURVEYS:  Modified Oswestry:   Interpretation of scores: Score Category Description  0-20% Minimal Disability The patient can cope with most living activities. Usually no treatment is indicated apart from advice on lifting, sitting and exercise  21-40% Moderate Disability The patient experiences more pain and difficulty with sitting, lifting and standing. Travel and social life are more difficult and they may be disabled from work. Personal care, sexual activity and sleeping are not grossly affected, and the patient can usually be managed by conservative means  41-60% Severe Disability Pain remains the main problem in this group, but activities of daily living are affected. These patients require a detailed investigation  61-80% Crippled Back pain impinges on all aspects of the patient's life. Positive intervention is required  81-100% Bed-bound  These patients are either bed-bound or exaggerating their symptoms  Bluford FORBES Zoe DELENA Karon DELENA, et al. Surgery versus conservative management of stable thoracolumbar fracture: the PRESTO feasibility RCT. [place] ([place]): [employer]; 2021 Nov. [facility] Technology Assessment, No. 25.62.) Appendix 3, Oswestry Disability Index category descriptors. Available from: FindJewel[employer].cz  Minimally Clinically Important Difference (MCID) = 12.8%   MUSCLE LENGTH: Hamstrings: Right  70 deg; Left 70 deg   POSTURE: decreased lumbar lordosis and flexed hip posture, knee valgus   PALPATION: deferred  LUMBAR ROM:   AROM eval  Flexion 90%  Extension 10%  Right lateral flexion 50%  Left lateral flexion 50%  Right rotation   Left rotation    (Blank rows = not tested)  LOWER EXTREMITY ROM:   WFL throughout  Active  Right eval Left eval  Hip flexion    Hip extension    Hip abduction    Hip adduction    Hip internal rotation    Hip external rotation    Knee flexion    Knee extension    Ankle dorsiflexion    Ankle plantarflexion    Ankle inversion    Ankle eversion     (Blank rows = not tested)  LOWER EXTREMITY MMT:  see 30s chair stand test  MMT Right eval Left eval  Hip flexion    Hip extension    Hip abduction    Hip adduction    Hip internal rotation    Hip external rotation    Knee flexion    Knee extension    Ankle dorsiflexion    Ankle plantarflexion    Ankle inversion    Ankle eversion     (Blank rows = not tested)  LUMBAR SPECIAL TESTS:  Straight leg raise test: Negative and Slump test: Negative  FUNCTIONAL TESTS:  30 seconds chair stand test 5 reps  GAIT: Distance walked: 48ft x2 Assistive device utilized: Single point cane Level of assistance: Complete Independence Comments: slow cadence  TREATMENT:  [facility] Adult PT Treatment:                                                DATE: 06/07/24 Therapeutic Exercise: Nustep L4 nu Ues, focus on valgus collapse  Neuromuscular re-ed: FAQs with adduction 15x2 Heel raise off 4 in step 10x Runners step 4 in 10/10 but unable to flex L hip Therapeutic Activity: Seated hamstring stretch 30s x2 B Heel raise from 4 in 10x2  [facility] Adult PT Treatment:  DATE: 06/05/24 Therapeutic Exercise: Nustep L4 8 min Neuromuscular re-ed: Supine core ball press B 3s 10x 10/10 unilateral FAQs with adduction 15x STS from airex pad 10x cuing for  mechanics Therapeutic Activity: Seated hamstring stretch 30s x2 B Supine hip fallouts GTB 15x B, 15/15 S/L clams GTB 15/15  [facility] Adult PT Treatment  05/30/2024:  Therapeutic Exercise:  nu-step L6 29m - LE only LTR S/L clam - GTB - 3x10 Bridge from ball - 2x10 Hip adduction squeeze pilates - 2x10 - 5'' hold Seated hip flexion with 4# - 3x10 ea LAQ - 3x10 ea - 4#  Therapeutic Activity  STS from raised table - 4x5 - 10# Hurdle walking      [facility] Adult PT Treatment:                                                DATE: 05/18/24 Therapeutic Exercise: Nustep L4 8 min  Neuromuscular re-ed:   05/18/24 0001  Berg Balance Test  Sit to Stand 4  Standing Unsupported 4  Sitting with Back Unsupported but Feet Supported on Floor or Stool 4  Stand to Sit 4  Transfers 4  Standing Unsupported with Eyes Closed 4  Standing Unsupported with Feet Together 4  From Standing, Reach Forward with Outstretched Arm 4  From Standing Position, Pick up Object from Floor 4  From Standing Position, Turn to Look Behind Over each Shoulder 4  Turn 360 Degrees 2  Standing Unsupported, Alternately Place Feet on Step/Stool 4  Standing Unsupported, One Foot in Front 3  Standing on One Leg 4  Total Score 53   Therapeutic Activity: 2 MWT 24ft with cane Supine hip fallouts GTB 15x B, 15/15 unilateral S/L clams GTB 15/15   [facility] Adult PT Treatment:                                                DATE: 05/17/24 Aquatic therapy at [facility]- [address] - therapeutic pool temp approximately 91 degrees. Pt enters building with SPC independently. Treatment took place in water 3.8 to  4 ft 8 in. deep depending upon activity.  Pt entered and exited the pool via stair and handrails independently with step to pattern. Patient entered water for aquatic therapy for first time and was introduced to principles and therapeutic effects of water as they ambulated and acclimated to pool.  Aquatic Exercise: Walking  forward/backwards/side stepping x 2 laps ea Side stepping rainbow DB shoulder abd/add x2 laps black noodle pull down with focus on core activation 2x10 noodle thoracic rotation x1' Hip abd/add Squats Heel/toe raises Hip ext/flex with knee straight Hip Circles CW/CCW Step up fwd and lat  Pt requires the buoyancy of water for active assisted exercises with buoyancy supported for strengthening and AROM exercises. Hydrostatic pressure also supports joints by unweighting joint load by at least 50 % in 3-4 feet depth water. 80% in chest to neck deep water. Water will provide assistance with movement using the current and laminar flow while the buoyancy reduces weight bearing. Pt requires the viscosity of the water for resistance with strengthening exercises.  [facility] Adult PT Treatment:  DATE: 05/11/24 Aquatic therapy at [facility]- [address] - therapeutic pool temp approximately 91 degrees. Pt enters building with SPC independently. Treatment took place in water 3.8 to  4 ft 8 in. deep depending upon activity.  Pt entered and exited the pool via stair and handrails independently with step to pattern. Patient entered water for aquatic therapy for first time and was introduced to principles and therapeutic effects of water as they ambulated and acclimated to pool.  Aquatic Exercise: Walking forward/backwards/side stepping x 2 laps ea Side stepping rainbow DB shoulder abd/add x2 laps Half noodle pull down with focus on core activation 2x10 Half noodle thoracic rotation x1' Standing with UE support edge of pool: Hip abd/add x20 BIL Squats x20 Heel/toe raises x20 Hip ext/flex with knee straight x 20 BIL Hip Circles CW/CCW x10 each BIL  Pt requires the buoyancy of water for active assisted exercises with buoyancy supported for strengthening and AROM exercises. Hydrostatic pressure also supports joints by unweighting joint load by at least 50 % in  3-4 feet depth water. 80% in chest to neck deep water. Water will provide assistance with movement using the current and laminar flow while the buoyancy reduces weight bearing. Pt requires the viscosity of the water for resistance with strengthening exercises.     [facility] Adult PT Treatment:                                                DATE: 05/08/24 Therapeutic Exercise: Nustep L3 8 min Neuromuscular re-ed: Supine hip fallouts GTB 10x B, 10/10 S/L clams GTB 10/10 Bridge against GTB 10x Bridge with ball squeeze 10x STS from airex pad Therapeutic Activity: Seated hamstring stretch 30s x2 B Supine QL stretch 30s x2 Supine march 15/15 with UE in 90d flexion P-ball curl ups 10x B, 10/10 unilateral  [facility] Adult PT Treatment:                                                DATE: 05/05/24 Therapeutic Exercise: Nustep L2 8 min Neuromuscular re-ed: Supine hip fallouts RTB 15x B, 15/15 S/L clams RTB 15/15 Bridge against RTB 15x Bridge with ball squeeze 15x Therapeutic Activity: Seated hamstring stretch 30s x2 B Supine march 15/15 P-ball curl ups 15x                                                                                                                     PATIENT EDUCATION:  Education details: Discussed eval findings, rehab rationale and POC and patient is in agreement  Person educated: Patient Education method: Explanation and Handouts Education comprehension: verbalized understanding and needs further education  HOME EXERCISE PROGRAM: Access Code: [email or web address] URL: [email or web address] Date: 05/30/2024 Prepared by: Helene Gasmen  Exercises -  Supine Bridge with Resistance Band  - 1-2 x daily - 5 x weekly - 3 sets - 10 reps - Hooklying Single Leg Bent Knee Fallouts with Resistance  - 1-2 x daily - 5 x weekly - 3 sets - 10 reps - Static Prone on Elbows  - 1-2 x daily - 5 x weekly - 1 sets - 2 min hold - Sit to Stand Without Arm Support  - 1 x daily - 4-7 x weekly -  3 sets - 5-10 reps - Standing March with Counter Support  - 1 x daily - 7 x weekly - 2 sets - 10 reps  ASSESSMENT:  CLINICAL IMPRESSION: Treatment centered on LE strength and body mechanics.  Instructed to modify tasks to prevent valgus collapse and pay attention to abduction    EVAL: Patient is a 83 y.o. female who was seen today for physical therapy evaluation and treatment for chronic low back pain and limited mobility due to underlying degenerative changes including stenosis.  Imaging studies show atrophy of lumbopelvic musculature.  BLE ROM and flexibility is functional and no neural tension signs noted.  Patient is a good candidate for OPPT to include aquatic PT, core strengthening and lumbar spine flexibility tasks.  OBJECTIVE IMPAIRMENTS: Abnormal gait, decreased activity tolerance, decreased coordination, decreased mobility, difficulty walking, decreased ROM, decreased strength, impaired perceived functional ability, improper body mechanics, postural dysfunction, and pain.   ACTIVITY LIMITATIONS: carrying, lifting, bending, standing, sleeping, transfers, and bed mobility  PERSONAL FACTORS: Age, Fitness, Past/current experiences, and Time since onset of injury/illness/exacerbation are also affecting patient's functional outcome.   REHAB POTENTIAL: Good  CLINICAL DECISION MAKING: Evolving/moderate complexity  EVALUATION COMPLEXITY: Moderate   GOALS: Goals reviewed with patient? No  SHORT TERM GOALS: Target date: 05/24/2024  Patient to demonstrate independence in HEP  Baseline: F9MG [facility] Goal status: INITIAL  2.  Assess 2 MWT  Baseline: TBD; 05/18/24 53/56 Goal status: Met  3.  Assess BERG for balance deficits Baseline: TBD; 05/18/24 53/56 Goal status: INITIAL  LONG TERM GOALS: Target date: 06/21/2024  Patient will acknowledge 4/10 pain at least once during episode of care   Baseline: 5/10 Goal status: INITIAL  2.  Patient will score at least 10/50 on ODI to signify  clinically meaningful improvement in functional abilities.   Baseline: 16/50 Goal status: INITIAL  3.  Patient will increase 30s chair stand reps from 5 to 8 without arms to demonstrate and improved functional ability with less pain/difficulty as well as reduce fall risk.  Baseline: 5 Goal status: INITIAL  4.  Assess BERG goal as needed Baseline: TBD; no need Goal status: Met  5.  Assess 2 MWT for progress Baseline: TBD Goal status: INITIAL   PLAN:  PT FREQUENCY: 1-2x/week  PT DURATION: 6 weeks  PLANNED INTERVENTIONS: 97110-Therapeutic exercises, 97530- Therapeutic activity, [id number]- Neuromuscular re-education, 97535- Self Care, [id number]- Manual therapy, [id number]- Gait training, [phone]- Aquatic Therapy, [phone] (1-2 muscles), [id number] (3+ muscles)- Dry Needling, Patient/Family education, Balance training, and Stair training.  PLAN FOR NEXT SESSION: HEP review and update, manual techniques as appropriate, aerobic tasks, ROM and flexibility activities, strengthening and PREs, TPDN, gait and balance training as needed     Reyes CHRISTELLA Kohut, PT 06/07/2024, 4:10 PM

## 2024-06-10 ENCOUNTER — Other Ambulatory Visit: Payer: Self-pay | Admitting: Internal Medicine

## 2024-06-10 DIAGNOSIS — G90519 Complex regional pain syndrome I of unspecified upper limb: Secondary | ICD-10-CM

## 2024-06-11 NOTE — Therapy (Unsigned)
 OUTPATIENT PHYSICAL THERAPY TREATMENT NOTE   Patient Name: Gwendolyn [name] MRN: [id number] DOB:01-21-1941, 83 y.o., female Today's Date: 06/12/2024  END OF SESSION:  PT End of Session - 06/12/24 1651     Visit Number 11    Number of Visits 16    Date for Recertification  06/26/24    Authorization Type HTA    PT Start Time 1610    PT Stop Time 1655    PT Time Calculation (min) 45 min    Activity Tolerance Patient tolerated treatment well    Behavior During Therapy [facility] for tasks assessed/performed              Past Medical History:  Diagnosis Date   Allergy    Arthritis    Blood transfusion without reported diagnosis    Cataract    removed both eyes    Cervical radicular pain 02/11/2016   Glaucoma    low pressure glaucoma    Hyperlipidemia    Hypertension    Osteopenia    Parkinson's disease (HCC)    Pelvis fracture (HCC) 03/26/2002   both sides fx   Positive colorectal cancer screening using Cologuard test 04/14/2017   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    Vitamin D  deficiency    Past Surgical History:  Procedure Laterality Date   COLONOSCOPY     DILATION AND CURETTAGE OF UTERUS     age 69    right elbow   surgery  March 26, 2002   right hand ulner nerve surgery  March 26, 2002   I and d done right hand/wrist   right shoulder humerus fx  july 2003   surgery done   right total hip arthroplasty  2008   right total knee arthroplasty  sept 2012   right wrist plating  2004   TONSILLECTOMY  age 33   TOTAL HIP ARTHROPLASTY  10/22/2011   Procedure: TOTAL HIP ARTHROPLASTY ANTERIOR APPROACH;  Surgeon: Donnice JONETTA Car, MD;  Location: [facility];  Service: Orthopedics;  Laterality: Left;  Left Total Hip Arthroplasty,  Anterior Approach    TOTAL KNEE ARTHROPLASTY Left 04/28/2022   Procedure: TOTAL KNEE ARTHROPLASTY;  Surgeon: Car Donnice, MD;  Location: [facility];  Service: Orthopedics;  Laterality: Left;   Patient Active Problem List   Diagnosis Date Noted    S/P total knee arthroplasty, 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   Parkinson's disease (HCC) 11/28/2021   B12 deficiency 05/28/2021   Cervical arthritis 09/27/2017   Open-angle glaucoma 05/14/2016   Generalized anxiety disorder 02/11/2016   Overweight (BMI 25.0-29.9) 04/25/2015   Medication management 02/12/2014   Essential hypertension 08/08/2013   Hyperlipidemia, mixed 08/08/2013   Abnormal glucose 08/08/2013   Vitamin D  deficiency 08/08/2013    PCP: Theophilus Andrews, Tully GRADE, MD   REFERRING PROVIDER: Patel, Donika K, DO  REFERRING DIAG: [phone] (ICD-10-CM) - Chronic bilateral low back pain without sciatica  Rationale for Evaluation and Treatment: Rehabilitation  THERAPY DIAG:  Other low back pain  Muscle weakness (generalized)  Other abnormalities of gait and mobility  ONSET DATE: chronic  SUBJECTIVE:  SUBJECTIVE STATEMENT: No new c/o.  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  She has low back pain sometimes at the end of the day.  She starting using a cane around the same time.  She had left knee replacement in 2023 and continued to walk with a limp.  She is very active and continues to go to balance class and water exercise.  She noticed that if she sits too long, she has pressure in the low back.  She has weakness in the right leg and has some difficulty with stairs and has to manually lift her right leg to get it into her car.  She denies weakness in the right leg or arms.  No problems with speech/swallow.  No numbness/tingling of the arms or legs.     She is retired Engineer, civil (consulting) from the surgical center.  She worked full-time until she was 23. She was very independent and kept up with her yard work until  2 years ago when her right leg started to get weak.  Fortunately, she has no falls except a mechanical one when she lost balance while trying to push a stake into the ground.     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 MRI lumbar spine showed left foraminal stenosis at L2-3 and L3-4.  Per my review, there is atrophy of the paraspinal muscles.  EMG shows neurogenic changes involving rectus femoris, adductor longus, iliopsoas, and gluteus medius muscles.  No evidence of myopathy or active denervation.     UPDATE 04/10/2024:  She has low back pain and achy pain on top of her feet which is worse with prolonged activity.  She gets relief with tramadol. She uses a cane for support. 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  There has been no change in her leg weakness.  She continues to feel that at times, her foot will turn inwards.  Fortunately, she has not had any falls.   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  Myopathy is less likely given normal CK and no findings of myopathy on EMG.  EMG showed diffuse neurogenic changes involving the hip muscles without active denervation. MRI of the hip shows severe muscle atrophy of the iliopsoas, gluteus medius, and gluteus minimus along with paraspinal muscle atrophy.  There is no evidence of hyperreflexia or increased tone to suggest UMN pathology. Previously discussed additional testing such as CSF testing or muscle biopsy, and opted to monitor due to stability of symptoms.  PAIN:  Are you having pain? Yes: NPRS scale: 5/10 Pain location: low back Pain description: ache Aggravating factors: position changes Relieving factors: sitting   PRECAUTIONS: None  RED FLAGS: None   WEIGHT BEARING RESTRICTIONS: No  FALLS:  Has patient fallen in last 6 months? No  OCCUPATION: retired  PLOF:  Independent  PATIENT GOALS: To manage my low back pain  NEXT MD VISIT: TBD  OBJECTIVE:  Note: Objective measures were completed at Evaluation unless otherwise noted.  DIAGNOSTIC FINDINGS:  IMPRESSION: 1. 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 2. 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 3. Left lateral recess narrowing at L1-2 and L2-3, which could cause left L2 and L3 radiculopathy.  Electronically Signed   By: Franky Stanford M.D.   On: 03/17/2023 01:53  PATIENT SURVEYS:  Modified Oswestry:   Interpretation of scores: Score Category Description  0-20% Minimal Disability The patient can cope with most living activities. Usually no treatment is indicated apart from advice on lifting, sitting and exercise  21-40% Moderate Disability The patient experiences more pain and difficulty with sitting, lifting and standing. Travel and social life are more difficult and they may be disabled from work. Personal care, sexual activity and sleeping are not grossly affected, and the patient can usually be managed by conservative means  41-60% Severe Disability Pain remains the main problem in this group, but activities of daily living are affected. These patients require a detailed investigation  61-80% Crippled Back pain impinges on all aspects of the patient's life. Positive intervention is required  81-100% Bed-bound  These patients are either bed-bound or exaggerating their symptoms  Bluford FORBES Zoe DELENA Karon DELENA, et al. Surgery versus conservative management of stable thoracolumbar fracture: the PRESTO feasibility RCT. [place] ([place]): [employer]; 2021 Nov. [facility] Technology Assessment, No. 25.62.) Appendix 3, Oswestry Disability Index category descriptors. Available from: FindJewelers.cz  Minimally Clinically Important Difference (MCID) = 12.8%   MUSCLE LENGTH: Hamstrings: Right  70 deg; Left 70 deg   POSTURE: decreased lumbar lordosis and flexed hip posture, knee valgus   PALPATION: deferred  LUMBAR ROM:   AROM eval  Flexion 90%  Extension 10%  Right lateral flexion 50%  Left lateral flexion 50%  Right rotation   Left rotation    (Blank rows = not tested)  LOWER EXTREMITY ROM:   WFL throughout  Active  Right eval Left eval  Hip flexion    Hip extension    Hip abduction    Hip adduction    Hip internal rotation    Hip external rotation    Knee flexion    Knee extension    Ankle dorsiflexion    Ankle plantarflexion    Ankle inversion    Ankle eversion     (Blank rows = not tested)  LOWER EXTREMITY MMT:  see 30s chair stand test  MMT Right eval Left eval 06/12/24 L  Hip flexion   4-  Hip extension   4-  Hip abduction   3  Hip adduction     Hip internal rotation     Hip external rotation     Knee flexion     Knee extension     Ankle dorsiflexion     Ankle plantarflexion     Ankle inversion     Ankle eversion      (Blank rows = not tested)  LUMBAR SPECIAL TESTS:  Straight leg raise test: Negative and Slump test: Negative  FUNCTIONAL TESTS:  30 seconds chair stand test 5 reps  GAIT: Distance walked: 68ft x2 Assistive device utilized: Single point cane Level of assistance: Complete Independence Comments: slow cadence  TREATMENT:  [facility] Adult PT Treatment:                                                DATE: 06/12/24 Therapeutic Exercise: Nustep L4 8 min LE's only  Therapeutic Activity: Supine marching R 2x10 S/L bridge 2x10 B Bridge with ball 2x10 Bridge against GTB 2x10 R hip fallouts GTB 2x10 STS 10x GTB around knees  Self Care: Assessment  of L hip finds good ROM and flexibility.  Weakness noted in clamshell position and with S/L abduction in hip extension.  Able to SL bridge but is less stable as opposed to LLE.  Able to march in sitting and standing positions, still needs to control valgus collapse.  R hip  weakness evident in standing as she does not have the stability to step with her L leg  [facility] Adult PT Treatment:                                                DATE: 06/07/24 Therapeutic Exercise: Nustep L4 nu Ues, focus on valgus collapse  Neuromuscular re-ed: FAQs with adduction 15x2 Heel raise off 4 in step 10x Runners step 4 in 10/10 but unable to flex L hip Therapeutic Activity: Seated hamstring stretch 30s x2 B Heel raise from 4 in 10x2  [facility] Adult PT Treatment:                                                DATE: 06/05/24 Therapeutic Exercise: Nustep L4 8 min Neuromuscular re-ed: Supine core ball press B 3s 10x 10/10 unilateral FAQs with adduction 15x STS from airex pad 10x cuing for mechanics Therapeutic Activity: Seated hamstring stretch 30s x2 B Supine hip fallouts GTB 15x B, 15/15 S/L clams GTB 15/15  [facility] Adult PT Treatment  05/30/2024:  Therapeutic Exercise:  nu-step L6 69m - LE only LTR S/L clam - GTB - 3x10 Bridge from ball - 2x10 Hip adduction squeeze pilates - 2x10 - 5'' hold Seated hip flexion with 4# - 3x10 ea LAQ - 3x10 ea - 4#  Therapeutic Activity  STS from raised table - 4x5 - 10# Hurdle walking      [facility] Adult PT Treatment:                                                DATE: 05/18/24 Therapeutic Exercise: Nustep L4 8 min  Neuromuscular re-ed:   05/18/24 0001  Berg Balance Test  Sit to Stand 4  Standing Unsupported 4  Sitting with Back Unsupported but Feet Supported on Floor or Stool 4  Stand to Sit 4  Transfers 4  Standing Unsupported with Eyes Closed 4  Standing Unsupported with Feet Together 4  From Standing, Reach Forward with Outstretched Arm 4  From Standing Position, Pick up Object from Floor 4  From Standing Position, Turn to Look Behind Over each Shoulder 4  Turn 360 Degrees 2  Standing Unsupported, Alternately Place Feet on Step/Stool 4  Standing Unsupported, One Foot in Front 3  Standing on One Leg 4  Total Score 53    Therapeutic Activity: 2 MWT 22ft with cane Supine hip fallouts GTB 15x B, 15/15 unilateral S/L clams GTB 15/15   [facility] Adult PT Treatment:                                                DATE: 05/17/24  Aquatic therapy at [facility] [address] - therapeutic pool temp approximately 91 degrees. Pt enters building with SPC independently. Treatment took place in water 3.8 to  4 ft 8 in. deep depending upon activity.  Pt entered and exited the pool via stair and handrails independently with step to pattern. Patient entered water for aquatic therapy for first time and was introduced to principles and therapeutic effects of water as they ambulated and acclimated to pool.  Aquatic Exercise: Walking forward/backwards/side stepping x 2 laps ea Side stepping rainbow DB shoulder abd/add x2 laps black noodle pull down with focus on core activation 2x10 noodle thoracic rotation x1' Hip abd/add Squats Heel/toe raises Hip ext/flex with knee straight Hip Circles CW/CCW Step up fwd and lat  Pt requires the buoyancy of water for active assisted exercises with buoyancy supported for strengthening and AROM exercises. Hydrostatic pressure also supports joints by unweighting joint load by at least 50 % in 3-4 feet depth water. 80% in chest to neck deep water. Water will provide assistance with movement using the current and laminar flow while the buoyancy reduces weight bearing. Pt requires the viscosity of the water for resistance with strengthening exercises.  [facility] Adult PT Treatment:                                                DATE: 05/11/24 Aquatic therapy at [facility] [address] - therapeutic pool temp approximately 91 degrees. Pt enters building with SPC independently. Treatment took place in water 3.8 to  4 ft 8 in. deep depending upon activity.  Pt entered and exited the pool via stair and handrails independently with step to pattern. Patient entered water for aquatic therapy for  first time and was introduced to principles and therapeutic effects of water as they ambulated and acclimated to pool.  Aquatic Exercise: Walking forward/backwards/side stepping x 2 laps ea Side stepping rainbow DB shoulder abd/add x2 laps Half noodle pull down with focus on core activation 2x10 Half noodle thoracic rotation x1' Standing with UE support edge of pool: Hip abd/add x20 BIL Squats x20 Heel/toe raises x20 Hip ext/flex with knee straight x 20 BIL Hip Circles CW/CCW x10 each BIL  Pt requires the buoyancy of water for active assisted exercises with buoyancy supported for strengthening and AROM exercises. Hydrostatic pressure also supports joints by unweighting joint load by at least 50 % in 3-4 feet depth water. 80% in chest to neck deep water. Water will provide assistance with movement using the current and laminar flow while the buoyancy reduces weight bearing. Pt requires the viscosity of the water for resistance with strengthening exercises.     [facility] Adult PT Treatment:                                                DATE: 05/08/24 Therapeutic Exercise: Nustep L3 8 min Neuromuscular re-ed: Supine hip fallouts GTB 10x B, 10/10 S/L clams GTB 10/10 Bridge against GTB 10x Bridge with ball squeeze 10x STS from airex pad Therapeutic Activity: Seated hamstring stretch 30s x2 B Supine QL stretch 30s x2 Supine march 15/15 with UE in 90d flexion P-ball curl ups 10x B, 10/10 unilateral  [facility] Adult PT Treatment:  DATE: 05/05/24 Therapeutic Exercise: Nustep L2 8 min Neuromuscular re-ed: Supine hip fallouts RTB 15x B, 15/15 S/L clams RTB 15/15 Bridge against RTB 15x Bridge with ball squeeze 15x Therapeutic Activity: Seated hamstring stretch 30s x2 B Supine march 15/15 P-ball curl ups 15x                                                                                                                     PATIENT EDUCATION:   Education details: Discussed eval findings, rehab rationale and POC and patient is in agreement  Person educated: Patient Education method: Explanation and Handouts Education comprehension: verbalized understanding and needs further education  HOME EXERCISE PROGRAM: Access Code: [email or web address] URL: [email or web address] Date: 05/30/2024 Prepared by: Helene Gasmen  Exercises - Supine Bridge with Resistance Band  - 1-2 x daily - 5 x weekly - 3 sets - 10 reps - Hooklying Single Leg Bent Knee Fallouts with Resistance  - 1-2 x daily - 5 x weekly - 3 sets - 10 reps - Static Prone on Elbows  - 1-2 x daily - 5 x weekly - 1 sets - 2 min hold - Sit to Stand Without Arm Support  - 1 x daily - 4-7 x weekly - 3 sets - 5-10 reps - Standing March with Counter Support  - 1 x daily - 7 x weekly - 2 sets - 10 reps  ASSESSMENT:  CLINICAL IMPRESSION: Today's focus was strengthening of R hip due to identified weakness and inability to stabilize RLE to allow LLE tasks including stepping and marching.  Emphasized stability tasks and cued for slow controled movement with brief hold at endrange.    EVAL: Patient is a 83 y.o. female who was seen today for physical therapy evaluation and treatment for chronic low back pain and limited mobility due to underlying degenerative changes including stenosis.  Imaging studies show atrophy of lumbopelvic musculature.  BLE ROM and flexibility is functional and no neural tension signs noted.  Patient is a good candidate for OPPT to include aquatic PT, core strengthening and lumbar spine flexibility tasks.  OBJECTIVE IMPAIRMENTS: Abnormal gait, decreased activity tolerance, decreased coordination, decreased mobility, difficulty walking, decreased ROM, decreased strength, impaired perceived functional ability, improper body mechanics, postural dysfunction, and pain.   ACTIVITY LIMITATIONS: carrying, lifting, bending, standing, sleeping, transfers, and bed  mobility  PERSONAL FACTORS: Age, Fitness, Past/current experiences, and Time since onset of injury/illness/exacerbation are also affecting patient's functional outcome.   REHAB POTENTIAL: Good  CLINICAL DECISION MAKING: Evolving/moderate complexity  EVALUATION COMPLEXITY: Moderate   GOALS: Goals reviewed with patient? No  SHORT TERM GOALS: Target date: 05/24/2024  Patient to demonstrate independence in HEP  Baseline: F9MG [facility] Goal status: INITIAL  2.  Assess 2 MWT  Baseline: TBD; 05/18/24 53/56 Goal status: Met  3.  Assess BERG for balance deficits Baseline: TBD; 05/18/24 53/56 Goal status: INITIAL  LONG TERM GOALS: Target date: 06/21/2024  Patient will acknowledge 4/10 pain at least once during episode of care   Baseline: 5/10 Goal  status: INITIAL  2.  Patient will score at least 10/50 on ODI to signify clinically meaningful improvement in functional abilities.   Baseline: 16/50 Goal status: INITIAL  3.  Patient will increase 30s chair stand reps from 5 to 8 without arms to demonstrate and improved functional ability with less pain/difficulty as well as reduce fall risk.  Baseline: 5 Goal status: INITIAL  4.  Assess BERG goal as needed Baseline: TBD; no need Goal status: Met  5.  Assess 2 MWT for progress Baseline: TBD Goal status: INITIAL   PLAN:  PT FREQUENCY: 1-2x/week  PT DURATION: 6 weeks  PLANNED INTERVENTIONS: 97110-Therapeutic exercises, 97530- Therapeutic activity, [id number]- Neuromuscular re-education, 97535- Self Care, 02859- Manual therapy, [id number]- Gait training, [phone]- Aquatic Therapy, [phone] (1-2 muscles), 20561 (3+ muscles)- Dry Needling, Patient/Family education, Balance training, and Stair training.  PLAN FOR NEXT SESSION: HEP review and update, manual techniques as appropriate, aerobic tasks, ROM and flexibility activities, strengthening and PREs, TPDN, gait and balance training as needed     Reyes CHRISTELLA Kohut, PT 06/12/2024, 4:53 PM

## 2024-06-12 ENCOUNTER — Ambulatory Visit

## 2024-06-12 ENCOUNTER — Other Ambulatory Visit: Payer: Self-pay | Admitting: Internal Medicine

## 2024-06-12 DIAGNOSIS — G90519 Complex regional pain syndrome I of unspecified upper limb: Secondary | ICD-10-CM

## 2024-06-12 DIAGNOSIS — M6281 Muscle weakness (generalized): Secondary | ICD-10-CM

## 2024-06-12 DIAGNOSIS — R2689 Other abnormalities of gait and mobility: Secondary | ICD-10-CM

## 2024-06-12 DIAGNOSIS — M5459 Other low back pain: Secondary | ICD-10-CM

## 2024-06-12 MED ORDER — PREGABALIN 150 MG PO CAPS: ORAL_CAPSULE | ORAL | 0 refills | Status: DC

## 2024-06-12 NOTE — Telephone Encounter (Signed)
 Copied from CRM [id number]. Topic: Clinical - Medication Refill >> Jun 12, 2024 11:28 AM Eva FALCON wrote: Medication: pregabalin  (LYRICA ) 150 MG capsule  Has the patient contacted their pharmacy? Yes (Agent: If no, request that the patient contact the pharmacy for the refill. If patient does not wish to contact the pharmacy document the reason why and proceed with request.) (Agent: If yes, when and what did the pharmacy advise?)  This is the patient's preferred pharmacy:  [id number] - [place], [place] - [address] [place] [address] Phone: [phone] Fax: [phone]  Is this the correct pharmacy for this prescription? Yes If no, delete pharmacy and type the correct one.   Has the prescription been filled recently? Yes  Is the patient out of the medication? Yes, states if she goes without it, gets sick.   Has the patient been seen for an appointment in the last year OR does the patient have an upcoming appointment? Yes  Can we respond through MyChart? Yes  Agent: Please be advised that Rx refills may take up to 3 business days. We ask that you follow-up with your pharmacy.

## 2024-06-14 ENCOUNTER — Encounter: Payer: Self-pay | Admitting: Physical Therapy

## 2024-06-14 ENCOUNTER — Ambulatory Visit: Admitting: Physical Therapy

## 2024-06-14 ENCOUNTER — Ambulatory Visit

## 2024-06-14 DIAGNOSIS — M5459 Other low back pain: Secondary | ICD-10-CM | POA: Diagnosis not present

## 2024-06-14 DIAGNOSIS — M6281 Muscle weakness (generalized): Secondary | ICD-10-CM

## 2024-06-14 DIAGNOSIS — R2689 Other abnormalities of gait and mobility: Secondary | ICD-10-CM

## 2024-06-14 NOTE — Therapy (Signed)
 OUTPATIENT PHYSICAL THERAPY TREATMENT NOTE   Patient Name: KRISSY OREBAUGH MRN: [id number] DOB:02-16-1941, 83 y.o., female Today's Date: 06/14/2024  END OF SESSION:  PT End of Session - 06/14/24 0803     Visit Number 12    Number of Visits 16    Date for Recertification  06/26/24    Authorization Type HTA    PT Start Time 0800    PT Stop Time 0841    PT Time Calculation (min) 41 min    Activity Tolerance Patient tolerated treatment well    Behavior During Therapy [facility] for tasks assessed/performed              Past Medical History:  Diagnosis Date   Allergy    Arthritis    Blood transfusion without reported diagnosis    Cataract    removed both eyes    Cervical radicular pain 02/11/2016   Glaucoma    low pressure glaucoma    Hyperlipidemia    Hypertension    Osteopenia    Parkinson's disease (HCC)    Pelvis fracture (HCC) 03/26/2002   both sides fx   Positive colorectal cancer screening using Cologuard test 04/14/2017   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    Vitamin D  deficiency    Past Surgical History:  Procedure Laterality Date   COLONOSCOPY     DILATION AND CURETTAGE OF UTERUS     age 76    right elbow   surgery  March 26, 2002   right hand ulner nerve surgery  March 26, 2002   I and d done right hand/wrist   right shoulder humerus fx  july 2003   surgery done   right total hip arthroplasty  2008   right total knee arthroplasty  sept 2012   right wrist plating  2004   TONSILLECTOMY  age 28   TOTAL HIP ARTHROPLASTY  10/22/2011   Procedure: TOTAL HIP ARTHROPLASTY ANTERIOR APPROACH;  Surgeon: Donnice JONETTA Car, MD;  Location: [facility];  Service: Orthopedics;  Laterality: Left;  Left Total Hip Arthroplasty,  Anterior Approach    TOTAL KNEE ARTHROPLASTY Left 04/28/2022   Procedure: TOTAL KNEE ARTHROPLASTY;  Surgeon: Car Donnice, MD;  Location: [facility];  Service: Orthopedics;  Laterality: Left;   Patient Active Problem List   Diagnosis Date Noted    S/P total knee arthroplasty, 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   Parkinson's disease (HCC) 11/28/2021   B12 deficiency 05/28/2021   Cervical arthritis 09/27/2017   Open-angle glaucoma 05/14/2016   Generalized anxiety disorder 02/11/2016   Overweight (BMI 25.0-29.9) 04/25/2015   Medication management 02/12/2014   Essential hypertension 08/08/2013   Hyperlipidemia, mixed 08/08/2013   Abnormal glucose 08/08/2013   Vitamin D  deficiency 08/08/2013    PCP: Theophilus Andrews, Tully GRADE, MD   REFERRING PROVIDER: Patel, Donika K, DO  REFERRING DIAG: [phone] (ICD-10-CM) - Chronic bilateral low back pain without sciatica  Rationale for Evaluation and Treatment: Rehabilitation  THERAPY DIAG:  Other low back pain  Muscle weakness (generalized)  Other abnormalities of gait and mobility  ONSET DATE: chronic  SUBJECTIVE:  SUBJECTIVE STATEMENT: Pt reports that she is feeling ok this morning.  Sees some improvement in endurance.   EVAL: In 2022, she recalls noticing that she began walking with a limp.  She has low back pain sometimes at the end of the day.  She starting using a cane around the same time.  She had left knee replacement in 2023 and continued to walk with a limp.  She is very active and continues to go to balance class and water exercise.  She noticed that if she sits too long, she has pressure in the low back.  She has weakness in the right leg and has some difficulty with stairs and has to manually lift her right leg to get it into her car.  She denies weakness in the right leg or arms.  No problems with speech/swallow.  No numbness/tingling of the arms or legs.     She is retired Engineer, civil (consulting) from the surgical center.  She worked full-time until she was 18. She was very independent and kept up with her yard work until  2 years ago when her right leg started to get weak.  Fortunately, she has no falls except a mechanical one when she lost balance while trying to push a stake into the ground.     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 MRI lumbar spine showed left foraminal stenosis at L2-3 and L3-4.  Per my review, there is atrophy of the paraspinal muscles.  EMG shows neurogenic changes involving rectus femoris, adductor longus, iliopsoas, and gluteus medius muscles.  No evidence of myopathy or active denervation.     UPDATE 04/10/2024:  She has low back pain and achy pain on top of her feet which is worse with prolonged activity.  She gets relief with tramadol. She uses a cane for support. 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  There has been no change in her leg weakness.  She continues to feel that at times, her foot will turn inwards.  Fortunately, she has not had any falls.   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  Myopathy is less likely given normal CK and no findings of myopathy on EMG.  EMG showed diffuse neurogenic changes involving the hip muscles without active denervation. MRI of the hip shows severe muscle atrophy of the iliopsoas, gluteus medius, and gluteus minimus along with paraspinal muscle atrophy.  There is no evidence of hyperreflexia or increased tone to suggest UMN pathology. Previously discussed additional testing such as CSF testing or muscle biopsy, and opted to monitor due to stability of symptoms.  PAIN:  Are you having pain? Yes: NPRS scale: 5/10 Pain location: low back Pain description: ache Aggravating factors: position changes Relieving factors: sitting   PRECAUTIONS: None  RED FLAGS: None   WEIGHT BEARING RESTRICTIONS: No  FALLS:  Has patient fallen in last 6 months? No  OCCUPATION: retired  PLOF:  Independent  PATIENT GOALS: To manage my low back pain  NEXT MD VISIT: TBD  OBJECTIVE:  Note: Objective measures were completed at Evaluation unless otherwise noted.  DIAGNOSTIC FINDINGS:  IMPRESSION: 1. 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 2. 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 3. Left lateral recess narrowing at L1-2 and L2-3, which could cause left L2 and L3 radiculopathy.  Electronically Signed   By: Franky Stanford M.D.   On: 03/17/2023 01:53  PATIENT SURVEYS:  Modified Oswestry:   Interpretation of scores: Score Category Description  0-20% Minimal Disability The patient can cope with most living activities. Usually no treatment is indicated apart from advice on lifting, sitting and exercise  21-40% Moderate Disability The patient experiences more pain and difficulty with sitting, lifting and standing. Travel and social life are more difficult and they may be disabled from work. Personal care, sexual activity and sleeping are not grossly affected, and the patient can usually be managed by conservative means  41-60% Severe Disability Pain remains the main problem in this group, but activities of daily living are affected. These patients require a detailed investigation  61-80% Crippled Back pain impinges on all aspects of the patient's life. Positive intervention is required  81-100% Bed-bound  These patients are either bed-bound or exaggerating their symptoms  Bluford FORBES Zoe DELENA Karon DELENA, et al. Surgery versus conservative management of stable thoracolumbar fracture: the PRESTO feasibility RCT. [place]): [employer]; 2021 Nov. [facility] [facility] [employer] Technology Assessment, No. 25.62.) Appendix 3, Oswestry Disability Index category descriptors. Available from: FindJewel[employer].cz  Minimally Clinically Important Difference (MCID) = 12.8%   MUSCLE LENGTH: Hamstrings: Right  70 deg; Left 70 deg   POSTURE: decreased lumbar lordosis and flexed hip posture, knee valgus   PALPATION: deferred  LUMBAR ROM:   AROM eval  Flexion 90%  Extension 10%  Right lateral flexion 50%  Left lateral flexion 50%  Right rotation   Left rotation    (Blank rows = not tested)  LOWER EXTREMITY ROM:   WFL throughout  Active  Right eval Left eval  Hip flexion    Hip extension    Hip abduction    Hip adduction    Hip internal rotation    Hip external rotation    Knee flexion    Knee extension    Ankle dorsiflexion    Ankle plantarflexion    Ankle inversion    Ankle eversion     (Blank rows = not tested)  LOWER EXTREMITY MMT:  see 30s chair stand test  MMT Right eval Left eval 06/12/24 L  Hip flexion   4-  Hip extension   4-  Hip abduction   3  Hip adduction     Hip internal rotation     Hip external rotation     Knee flexion     Knee extension     Ankle dorsiflexion     Ankle plantarflexion     Ankle inversion     Ankle eversion      (Blank rows = not tested)  LUMBAR SPECIAL TESTS:  Straight leg raise test: Negative and Slump test: Negative  FUNCTIONAL TESTS:  30 seconds chair stand test 5 reps  GAIT: Distance walked: 7ft x2 Assistive device utilized: Single point cane Level of assistance: Complete Independence Comments: slow cadence  TREATMENT:  [facility] Adult PT Treatment:                                                DATE: 06/14/24 Therapeutic Exercise: Nustep L7 5 min LE's only  Therapeutic Activity: Standing march with visual feedback Lateral walking Step up with march - ipsilateral support Hip hike - ipsilateral UE support - unable Extensive discussion on role of lateral hip strength  in gait   [facility] Adult PT Treatment:                                                DATE: 06/07/24 Therapeutic Exercise: Nustep L4 nu Ues, focus on valgus collapse  Neuromuscular re-ed: FAQs with adduction 15x2 Heel raise off 4 in step  10x Runners step 4 in 10/10 but unable to flex L hip Therapeutic Activity: Seated hamstring stretch 30s x2 B Heel raise from 4 in 10x2  [facility] Adult PT Treatment:                                                DATE: 06/05/24 Therapeutic Exercise: Nustep L4 8 min Neuromuscular re-ed: Supine core ball press B 3s 10x 10/10 unilateral FAQs with adduction 15x STS from airex pad 10x cuing for mechanics Therapeutic Activity: Seated hamstring stretch 30s x2 B Supine hip fallouts GTB 15x B, 15/15 S/L clams GTB 15/15  [facility] Adult PT Treatment  05/30/2024:  Therapeutic Exercise:  nu-step L6 42m - LE only LTR S/L clam - GTB - 3x10 Bridge from ball - 2x10 Hip adduction squeeze pilates - 2x10 - 5'' hold Seated hip flexion with 4# - 3x10 ea LAQ - 3x10 ea - 4#  Therapeutic Activity  STS from raised table - 4x5 - 10# Hurdle walking      [facility] Adult PT Treatment:                                                DATE: 05/18/24 Therapeutic Exercise: Nustep L4 8 min  Neuromuscular re-ed:   05/18/24 0001  Berg Balance Test  Sit to Stand 4  Standing Unsupported 4  Sitting with Back Unsupported but Feet Supported on Floor or Stool 4  Stand to Sit 4  Transfers 4  Standing Unsupported with Eyes Closed 4  Standing Unsupported with Feet Together 4  From Standing, Reach Forward with Outstretched Arm 4  From Standing Position, Pick up Object from Floor 4  From Standing Position, Turn to Look Behind Over each Shoulder 4  Turn 360 Degrees 2  Standing Unsupported, Alternately Place Feet on Step/Stool 4  Standing Unsupported, One Foot in Front 3  Standing on One Leg 4  Total Score 53   Therapeutic Activity: 2 MWT 262ft with cane Supine hip fallouts GTB 15x B, 15/15 unilateral S/L clams GTB 15/15   [facility] Adult PT Treatment:                                                DATE: 05/17/24 Aquatic therapy at [facility] [address] - therapeutic pool temp approximately 91 degrees. Pt enters  building with SPC independently. Treatment took place in water 3.8 to  4 ft 8 in. deep depending upon activity.  Pt entered and exited the pool via stair and handrails independently with step to pattern. Patient entered water for aquatic therapy for first time and was introduced to principles  and therapeutic effects of water as they ambulated and acclimated to pool.  Aquatic Exercise: Walking forward/backwards/side stepping x 2 laps ea Side stepping rainbow DB shoulder abd/add x2 laps black noodle pull down with focus on core activation 2x10 noodle thoracic rotation x1' Hip abd/add Squats Heel/toe raises Hip ext/flex with knee straight Hip Circles CW/CCW Step up fwd and lat  Pt requires the buoyancy of water for active assisted exercises with buoyancy supported for strengthening and AROM exercises. Hydrostatic pressure also supports joints by unweighting joint load by at least 50 % in 3-4 feet depth water. 80% in chest to neck deep water. Water will provide assistance with movement using the current and laminar flow while the buoyancy reduces weight bearing. Pt requires the viscosity of the water for resistance with strengthening exercises.  [facility] Adult PT Treatment:                                                DATE: 05/11/24 Aquatic therapy at [facility]- [address] - therapeutic pool temp approximately 91 degrees. Pt enters building with SPC independently. Treatment took place in water 3.8 to  4 ft 8 in. deep depending upon activity.  Pt entered and exited the pool via stair and handrails independently with step to pattern. Patient entered water for aquatic therapy for first time and was introduced to principles and therapeutic effects of water as they ambulated and acclimated to pool.  Aquatic Exercise: Walking forward/backwards/side stepping x 2 laps ea Side stepping rainbow DB shoulder abd/add x2 laps Half noodle pull down with focus on core activation 2x10 Half noodle thoracic  rotation x1' Standing with UE support edge of pool: Hip abd/add x20 BIL Squats x20 Heel/toe raises x20 Hip ext/flex with knee straight x 20 BIL Hip Circles CW/CCW x10 each BIL  Pt requires the buoyancy of water for active assisted exercises with buoyancy supported for strengthening and AROM exercises. Hydrostatic pressure also supports joints by unweighting joint load by at least 50 % in 3-4 feet depth water. 80% in chest to neck deep water. Water will provide assistance with movement using the current and laminar flow while the buoyancy reduces weight bearing. Pt requires the viscosity of the water for resistance with strengthening exercises.     [facility] Adult PT Treatment:                                                DATE: 05/08/24 Therapeutic Exercise: Nustep L3 8 min Neuromuscular re-ed: Supine hip fallouts GTB 10x B, 10/10 S/L clams GTB 10/10 Bridge against GTB 10x Bridge with ball squeeze 10x STS from airex pad Therapeutic Activity: Seated hamstring stretch 30s x2 B Supine QL stretch 30s x2 Supine march 15/15 with UE in 90d flexion P-ball curl ups 10x B, 10/10 unilateral  [facility] Adult PT Treatment:                                                DATE: 05/05/24 Therapeutic Exercise: Nustep L2 8 min Neuromuscular re-ed: Supine hip fallouts RTB 15x B, 15/15 S/L clams RTB 15/15 Bridge against  RTB 15x Bridge with ball squeeze 15x Therapeutic Activity: Seated hamstring stretch 30s x2 B Supine march 15/15 P-ball curl ups 15x                                                                                                                     PATIENT EDUCATION:  Education details: Discussed eval findings, rehab rationale and POC and patient is in agreement  Person educated: Patient Education method: Explanation and Handouts Education comprehension: verbalized understanding and needs further education  HOME EXERCISE PROGRAM: Access Code: [email or web address] URL:  [email or web address] Date: 06/14/2024 Prepared by: Helene Gasmen  Exercises - Supine Bridge with Resistance Band  - 1-2 x daily - 5 x weekly - 3 sets - 10 reps - Hooklying Single Leg Bent Knee Fallouts with Resistance  - 1-2 x daily - 5 x weekly - 3 sets - 10 reps - Static Prone on Elbows  - 1-2 x daily - 5 x weekly - 1 sets - 2 min hold - Sit to Stand Without Arm Support  - 1 x daily - 4-7 x weekly - 3 sets - 5-10 reps - Standing March with Counter Support  - 1 x daily - 7 x weekly - 2 sets - 10 reps - Forward Step Touch  - 1 x daily - 7 x weekly - 3 sets - 10 reps - Sideways Step Touch  - 1 x daily - 7 x weekly - 3 sets - 10 reps  ASSESSMENT:  CLINICAL IMPRESSION: Chiffon tolerated session well with no adverse reaction.  Continued working on R lateral hip strengthening with in depth discussion on role in gait.  Updated HEP.    EVAL: Patient is a 83 y.o. female who was seen today for physical therapy evaluation and treatment for chronic low back pain and limited mobility due to underlying degenerative changes including stenosis.  Imaging studies show atrophy of lumbopelvic musculature.  BLE ROM and flexibility is functional and no neural tension signs noted.  Patient is a good candidate for OPPT to include aquatic PT, core strengthening and lumbar spine flexibility tasks.  OBJECTIVE IMPAIRMENTS: Abnormal gait, decreased activity tolerance, decreased coordination, decreased mobility, difficulty walking, decreased ROM, decreased strength, impaired perceived functional ability, improper body mechanics, postural dysfunction, and pain.   ACTIVITY LIMITATIONS: carrying, lifting, bending, standing, sleeping, transfers, and bed mobility  PERSONAL FACTORS: Age, Fitness, Past/current experiences, and Time since onset of injury/illness/exacerbation are also affecting patient's functional outcome.   REHAB POTENTIAL: Good  CLINICAL DECISION MAKING: Evolving/moderate  complexity  EVALUATION COMPLEXITY: Moderate   GOALS: Goals reviewed with patient? No  SHORT TERM GOALS: Target date: 05/24/2024  Patient to demonstrate independence in HEP  Baseline: F9MG [facility] Goal status: INITIAL  2.  Assess 2 MWT  Baseline: TBD; 05/18/24 53/56 Goal status: Met  3.  Assess BERG for balance deficits Baseline: TBD; 05/18/24 53/56 Goal status: INITIAL  LONG TERM GOALS: Target date: 06/21/2024  Patient will acknowledge 4/10 pain at least once during episode of care  Baseline: 5/10 Goal status: INITIAL  2.  Patient will score at least 10/50 on ODI to signify clinically meaningful improvement in functional abilities.   Baseline: 16/50 Goal status: INITIAL  3.  Patient will increase 30s chair stand reps from 5 to 8 without arms to demonstrate and improved functional ability with less pain/difficulty as well as reduce fall risk.  Baseline: 5 Goal status: INITIAL  4.  Assess BERG goal as needed Baseline: TBD; no need Goal status: Met  5.  Assess 2 MWT for progress Baseline: TBD Goal status: INITIAL   PLAN:  PT FREQUENCY: 1-2x/week  PT DURATION: 6 weeks  PLANNED INTERVENTIONS: 97110-Therapeutic exercises, 97530- Therapeutic activity, [id number]- Neuromuscular re-education, 97535- Self Care, [id number]- Manual therapy, [id number]- Gait training, [phone]- Aquatic Therapy, [phone] (1-2 muscles), [id number] (3+ muscles)- Dry Needling, Patient/Family education, Balance training, and Stair training.  PLAN FOR NEXT SESSION: HEP review and update, manual techniques as appropriate, aerobic tasks, ROM and flexibility activities, strengthening and PREs, TPDN, gait and balance training as needed     Helene FORBES Gasmen, PT 06/14/2024, 8:58 AM

## 2024-06-15 ENCOUNTER — Encounter: Payer: Self-pay | Admitting: Family Medicine

## 2024-06-15 ENCOUNTER — Ambulatory Visit: Admitting: Family Medicine

## 2024-06-15 DIAGNOSIS — Z0000 Encounter for general adult medical examination without abnormal findings: Secondary | ICD-10-CM

## 2024-06-15 NOTE — Patient Instructions (Addendum)
 I really enjoyed getting to talk with you today! I am available on Tuesdays and Thursdays for virtual visits if you have any questions or concerns, or if I can be of any further assistance.   CHECKLIST FROM ANNUAL WELLNESS VISIT:  -Follow up (please call to schedule if not scheduled after visit):   -yearly for annual wellness visit with primary care office  Here is a list of your preventive care/health maintenance measures and the plan for each if any are due:  PLAN For any measures below that may be due:    1. Can get the flu shot at the office or at the pharmacy. Can get covid vaccine at the pharmacy. Please let us  know when you get any vaccines at the pharmacy so that we can update your record accurately.   Health Maintenance  Topic Date Due   Influenza Vaccine  03/31/2024   Medicare Annual Wellness (AWV)  04/13/2024   COVID-19 Vaccine (6 - 2025-26 season) 05/01/2024   Mammogram  01/12/2025   DTaP/Tdap/Td (3 - Td or Tdap) 02/13/2032   Pneumococcal Vaccine: 50+ Years  Completed   DEXA SCAN  Completed   Zoster Vaccines- Shingrix  Completed   Meningococcal B Vaccine  Aged Out    -See a dentist at least yearly  -Get your eyes checked and then per your eye specialist's recommendations  -Other issues addressed today:   1. Please limit alcohol to no more than 1 alcoholic drink per day.   -I have included below further information regarding a healthy whole foods based diet, physical activity guidelines for adults, stress management and opportunities for social connections. I hope you find this information useful.   -----------------------------------------------------------------------------------------------------------------------------------------------------------------------------------------------------------------------------------------------------------    NUTRITION: -eat real food: lots of colorful vegetables (half the plate) and fruits -5-7 servings of vegetables and  fruits per day (fresh or steamed is best), exp. 2 servings of vegetables with lunch and dinner and 2 servings of fruit per day. Berries and greens such as kale and collards are great choices.  -consume on a regular basis:  fresh fruits, fresh veggies, fish, nuts, seeds, healthy oils (such as olive oil, avocado oil), whole grains (make sure for bread/pasta/crackers/etc., that the first ingredient on label contains the word whole), legumes. -can eat small amounts of dairy and lean meat (no larger than the palm of your hand), but avoid processed meats such as ham, bacon, lunch meat, etc. -drink water -try to avoid fast food and pre-packaged foods, processed meat, ultra processed foods/beverages (donuts, candy, etc.) -most experts advise limiting sodium to < 2300mg  per day, should limit further is any chronic conditions such as high blood pressure, heart disease, diabetes, etc. The American Heart Association advised that < 1500mg  is is ideal -try to avoid foods/beverages that contain any ingredients with names you do not recognize  -try to avoid foods/beverages  with added sugar or sweeteners/sweets  -try to avoid sweet drinks (including diet drinks): soda, juice, Gatorade, sweet tea, power drinks, diet drinks -try to avoid white rice, white bread, pasta (unless whole grain)  EXERCISE GUIDELINES FOR ADULTS: -if you wish to increase your physical activity, do so gradually and with the approval of your doctor -STOP and seek medical care immediately if you have any chest pain, chest discomfort or trouble breathing when starting or increasing exercise  -move and stretch your body, legs, feet and arms when sitting for long periods -Physical activity guidelines for optimal health in adults: -get at least 150 minutes per week of moderate  exercise (can talk, but not sing); this is about 20-30 minutes of sustained activity 5-7 days per week or two 10-15 minute episodes of sustained activity 5-7 days per  week -do some muscle building/resistance training/strength training at least 2 days per week  -balance exercises 3+ days per week:   Stand somewhere where you have something sturdy to hold onto if you lose balance    1) lift up on toes, then back down, start with 5x per day and work up to 20x   2) stand and lift one leg straight out to the side so that foot is a few inches of the floor, start with 5x each side and work up to 20x each side   3) stand on one foot, start with 5 seconds each side and work up to 20 seconds on each side  If you need ideas or help with getting more active:  -Silver sneakers https://tools.silversneakers.com  -Walk with a Doc: http://www.duncan-williams.com/  -try to include resistance (weight lifting/strength building) and balance exercises twice per week: or the following link for ideas: http://castillo-powell.com/  BuyDucts.dk  STRESS MANAGEMENT: -can try meditating, or just sitting quietly with deep breathing while intentionally relaxing all parts of your body for 5 minutes daily -if you need further help with stress, anxiety or depression please follow up with your primary doctor or contact the wonderful folks at [facility]: [phone]  SOCIAL CONNECTIONS: -options in [place] if you wish to engage in more social and exercise related activities:  -Silver sneakers https://tools.silversneakers.com  -Walk with a Doc: http://www.duncan-williams.com/  -Check out the [facility] Active Adults 50+ section on the [place] of [employer] (hiking clubs, book clubs, cards and games, chess, exercise classes, aquatic classes and much more) - see the website for details: https://www.[place].gov/departments/parks-recreation/active-adults50  -YouTube has lots of exercise videos for different ages and abilities as well  -[facility] (a variety of  indoor and outdoor inperson activities for adults). [phone]. [address].  -Virtual Online Classes (a variety of topics): see seniorplanet.org or call [phone]  -consider volunteering at a school, hospice center, church, senior center or elsewhere

## 2024-06-15 NOTE — Progress Notes (Signed)
 Patient unable to obtain vital signs due to telehealth visit

## 2024-06-15 NOTE — Therapy (Incomplete)
 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    Vitamin D  deficiency    Past Surgical History:  Procedure Laterality Date   COLONOSCOPY     DILATION AND CURETTAGE OF UTERUS     age 26    right elbow   surgery  March 26, 2002   right hand ulner nerve surgery  March 26, 2002   I and d done right hand/wrist   right shoulder humerus fx  july 2003   surgery done   right total hip arthroplasty  2008   right total knee arthroplasty  sept 2012   right wrist plating  2004   TONSILLECTOMY  age 74   TOTAL HIP ARTHROPLASTY  10/22/2011   Procedure: TOTAL HIP ARTHROPLASTY ANTERIOR APPROACH;  Surgeon: Donnice JONETTA Car, MD;  Location: [facility];  Service: Orthopedics;  Laterality: Left;  Left Total Hip Arthroplasty,  Anterior Approach    TOTAL KNEE ARTHROPLASTY Left 04/28/2022   Procedure: TOTAL KNEE ARTHROPLASTY;  Surgeon: Car Donnice, MD;  Location: [facility];  Service: Orthopedics;  Laterality: Left;   Patient Active Problem List   Diagnosis Date Noted   S/P total knee arthroplasty, 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   Parkinson's disease (HCC) 11/28/2021   B12 deficiency 05/28/2021   Cervical arthritis 09/27/2017   Open-angle glaucoma 05/14/2016   Generalized anxiety disorder 02/11/2016   Overweight (BMI 25.0-29.9) 04/25/2015   Medication management 02/12/2014   Essential hypertension 08/08/2013   Hyperlipidemia,  mixed 08/08/2013   Abnormal glucose 08/08/2013   Vitamin D  deficiency 08/08/2013    PCP: Theophilus Andrews, Tully GRADE, MD   REFERRING PROVIDER: Patel, Donika K, DO  REFERRING DIAG: [phone] (ICD-10-CM) - Chronic bilateral low back pain without sciatica  Rationale for Evaluation and Treatment: Rehabilitation  THERAPY DIAG:  No diagnosis found.  ONSET DATE: chronic  SUBJECTIVE:  SUBJECTIVE STATEMENT:  ***  Pt reports that she is feeling ok this morning.  Sees some improvement in endurance.   EVAL: In 2022, she recalls noticing that she began walking with a limp.  She has low back pain sometimes at the end of the day.  She starting using a cane around the same time.  She had left knee replacement in 2023 and continued to walk with a limp.  She is very active and continues to go to balance class and water exercise.  She noticed that if she sits too long, she has pressure in the low back.  She has weakness in the right leg and has some difficulty with stairs and has to manually lift her right leg to get it into her car.  She denies weakness in the right leg or arms.  No problems with speech/swallow.  No numbness/tingling of the arms or legs.     She is retired Engineer, civil (consulting) from the surgical center.  She worked full-time until she was 89. She was very independent and kept up with her yard work until 2 years ago when her right leg started to get weak.  Fortunately, she has no falls except a mechanical one when she lost balance while trying to push a stake into the ground.     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 MRI lumbar spine showed left foraminal stenosis at L2-3 and L3-4.  Per my review, there is atrophy of the paraspinal muscles.  EMG  shows neurogenic changes involving rectus femoris, adductor longus, iliopsoas, and gluteus medius muscles.  No evidence of myopathy or active denervation.     UPDATE 04/10/2024:  She has low back pain and achy pain on top of her feet which is worse with prolonged activity.  She gets relief with tramadol. She uses a cane for support. 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  There has been no change in her leg weakness.  She continues to feel that at times, her foot will turn inwards.  Fortunately, she has not had any falls.   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  Myopathy is less likely given normal CK and no findings of myopathy on EMG.  EMG showed diffuse neurogenic changes involving the hip muscles without active denervation. MRI of the hip shows severe muscle atrophy of the iliopsoas, gluteus medius, and gluteus minimus along with paraspinal muscle atrophy.  There is no evidence of hyperreflexia or increased tone to suggest UMN pathology. Previously discussed additional testing such as CSF testing or muscle biopsy, and opted to monitor due to stability of symptoms.  PAIN:  Are you having pain? Yes: NPRS scale: 5/10 Pain location: low back Pain description: ache Aggravating factors: position changes Relieving factors: sitting   PRECAUTIONS: None  RED FLAGS: None   WEIGHT BEARING RESTRICTIONS: No  FALLS:  Has patient fallen in last 6 months? No  OCCUPATION: retired  PLOF: Independent  PATIENT GOALS: To manage my low back pain  NEXT MD VISIT: TBD  OBJECTIVE:  Note: Objective measures were completed at Evaluation unless otherwise noted.  DIAGNOSTIC FINDINGS:  IMPRESSION: 1. 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 2. 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 3. Left lateral recess narrowing at L1-2 and L2-3, which could  cause left L2 and L3 radiculopathy.  Electronically Signed   By: Franky Stanford M.D.   On: 03/17/2023 01:53  PATIENT SURVEYS:  Modified Oswestry:   Interpretation of scores: Score Category Description  0-20% Minimal Disability The patient can cope with most living activities. Usually no treatment is indicated apart from advice on lifting, sitting and exercise  21-40% Moderate Disability The patient experiences more pain and difficulty with sitting, lifting and standing. Travel and social life are more difficult and they may be disabled from work. Personal care, sexual activity and sleeping are not grossly affected, and the patient can usually be managed by conservative means  41-60% Severe Disability Pain remains the main problem in this group, but activities of daily living are affected. These patients require a detailed investigation  61-80% Crippled Back pain impinges on all aspects of the patient's life. Positive intervention is required  81-100% Bed-bound  These patients are either bed-bound or exaggerating their symptoms  Bluford FORBES Zoe DELENA Karon DELENA, et al. Surgery versus conservative management of stable thoracolumbar fracture: the PRESTO feasibility RCT. [place]): [employer]; 2021 Nov. [facility] Assessment, No. 25.62.) Appendix 3, Oswestry Disability Index category descriptors. Available from: FindJewelers.cz  Minimally Clinically Important Difference (MCID) = 12.8%   MUSCLE LENGTH: Hamstrings: Right 70 deg; Left 70 deg   POSTURE: decreased lumbar lordosis and flexed hip posture, knee valgus   PALPATION: deferred  LUMBAR ROM:   AROM eval  Flexion 90%  Extension 10%  Right lateral flexion 50%  Left lateral flexion 50%  Right rotation   Left rotation    (Blank rows = not tested)  LOWER EXTREMITY ROM:   WFL throughout  Active  Right eval Left eval  Hip flexion    Hip extension    Hip abduction     Hip adduction    Hip internal rotation    Hip external rotation    Knee flexion    Knee extension    Ankle dorsiflexion    Ankle plantarflexion    Ankle inversion    Ankle eversion     (Blank rows = not tested)  LOWER EXTREMITY MMT:  see 30s chair stand test  MMT Right eval Left eval 06/12/24 L  Hip flexion   4-  Hip extension   4-  Hip abduction   3  Hip adduction     Hip internal rotation     Hip external rotation     Knee flexion     Knee extension     Ankle dorsiflexion     Ankle plantarflexion     Ankle inversion     Ankle eversion      (Blank rows = not tested)  LUMBAR SPECIAL TESTS:  Straight leg raise test: Negative and Slump test: Negative  FUNCTIONAL TESTS:  30 seconds chair stand test 5 reps  GAIT: Distance walked: 63ft x2 Assistive device utilized: Single point cane Level of assistance: Complete Independence Comments: slow cadence  TREATMENT:  [facility] Adult PT Treatment:                                                DATE: 06/21/24 Therapeutic Exercise: Nustep L7 5 min LE's only Therapeutic Activity: Standing march with visual feedback Lateral walking Step up with march - ipsilateral support Hip hike - ipsilateral UE support - unable Extensive discussion on role of lateral hip strength in  gait   [facility] Adult PT Treatment:                                                DATE: 06/14/24 Therapeutic Exercise: Nustep L7 5 min LE's only  Therapeutic Activity: Standing march with visual feedback Lateral walking Step up with march - ipsilateral support Hip hike - ipsilateral UE support - unable Extensive discussion on role of lateral hip strength in gait   [facility] Adult PT Treatment:                                                DATE: 06/07/24 Therapeutic Exercise: Nustep L4 nu Ues, focus on valgus collapse  Neuromuscular re-ed: FAQs with adduction 15x2 Heel raise off 4 in step 10x Runners step 4 in 10/10 but unable to flex L hip Therapeutic  Activity: Seated hamstring stretch 30s x2 B Heel raise from 4 in 10x2                                                                                                                      PATIENT EDUCATION:  Education details: Discussed eval findings, rehab rationale and POC and patient is in agreement  Person educated: Patient Education method: Explanation and Handouts Education comprehension: verbalized understanding and needs further education  HOME EXERCISE PROGRAM: Access Code: [email or web address] URL: [email or web address] Date: 06/14/2024 Prepared by: Helene Gasmen  Exercises - Supine Bridge with Resistance Band  - 1-2 x daily - 5 x weekly - 3 sets - 10 reps - Hooklying Single Leg Bent Knee Fallouts with Resistance  - 1-2 x daily - 5 x weekly - 3 sets - 10 reps - Static Prone on Elbows  - 1-2 x daily - 5 x weekly - 1 sets - 2 min hold - Sit to Stand Without Arm Support  - 1 x daily - 4-7 x weekly - 3 sets - 5-10 reps - Standing March with Counter Support  - 1 x daily - 7 x weekly - 2 sets - 10 reps - Forward Step Touch  - 1 x daily - 7 x weekly - 3 sets - 10 reps - Sideways Step Touch  - 1 x daily - 7 x weekly - 3 sets - 10 reps  ASSESSMENT:  CLINICAL IMPRESSION:  ***  Sadi tolerated session well with no adverse reaction.  Continued working on R lateral hip strengthening with in depth discussion on role in gait.  Updated HEP.    EVAL: Patient is a 83 y.o. female who was seen today for physical therapy evaluation and treatment for chronic low back pain and limited mobility due to underlying degenerative changes including stenosis.  Imaging studies show atrophy of lumbopelvic musculature.  BLE ROM  and flexibility is functional and no neural tension signs noted.  Patient is a good candidate for OPPT to include aquatic PT, core strengthening and lumbar spine flexibility tasks.  OBJECTIVE IMPAIRMENTS: Abnormal gait, decreased activity tolerance, decreased coordination,  decreased mobility, difficulty walking, decreased ROM, decreased strength, impaired perceived functional ability, improper body mechanics, postural dysfunction, and pain.   ACTIVITY LIMITATIONS: carrying, lifting, bending, standing, sleeping, transfers, and bed mobility  PERSONAL FACTORS: Age, Fitness, Past/current experiences, and Time since onset of injury/illness/exacerbation are also affecting patient's functional outcome.   REHAB POTENTIAL: Good  CLINICAL DECISION MAKING: Evolving/moderate complexity  EVALUATION COMPLEXITY: Moderate   GOALS: Goals reviewed with patient? No  SHORT TERM GOALS: Target date: 05/24/2024  Patient to demonstrate independence in HEP  Baseline: F9MG [facility] Goal status: INITIAL  2.  Assess 2 MWT  Baseline: TBD; 05/18/24 53/56 Goal status: Met  3.  Assess BERG for balance deficits Baseline: TBD; 05/18/24 53/56 Goal status: INITIAL  LONG TERM GOALS: Target date: 06/21/2024  Patient will acknowledge 4/10 pain at least once during episode of care   Baseline: 5/10 Goal status: INITIAL  2.  Patient will score at least 10/50 on ODI to signify clinically meaningful improvement in functional abilities.   Baseline: 16/50 Goal status: INITIAL  3.  Patient will increase 30s chair stand reps from 5 to 8 without arms to demonstrate and improved functional ability with less pain/difficulty as well as reduce fall risk.  Baseline: 5 Goal status: INITIAL  4.  Assess BERG goal as needed Baseline: TBD; no need Goal status: Met  5.  Assess 2 MWT for progress Baseline: TBD Goal status: INITIAL   PLAN:  PT FREQUENCY: 1-2x/week  PT DURATION: 6 weeks  PLANNED INTERVENTIONS: 97110-Therapeutic exercises, 97530- Therapeutic activity, [id number]- Neuromuscular re-education, 97535- Self Care, [id number]- Manual therapy, [id number]- Gait training, [phone]- Aquatic Therapy, [phone] (1-2 muscles), [id number] (3+ muscles)- Dry Needling, Patient/Family education, Balance training, and Stair  training.  PLAN FOR NEXT SESSION: HEP review and update, manual techniques as appropriate, aerobic tasks, ROM and flexibility activities, strengthening and PREs, TPDN, gait and balance training as needed     Corean Pouch, PTA 06/15/2024, 11:40 AM

## 2024-06-15 NOTE — Progress Notes (Signed)
 PATIENT CHECK-IN and HEALTH RISK ASSESSMENT QUESTIONNAIRE:  -completed by phone/video for upcoming Medicare Preventive Visit   Pre-Visit Check-in: 1)Vitals (height, wt, BP, etc) - record in vitals section for visit on day of visit Request home vitals (wt, BP, etc.) and enter into vitals, THEN update Vital Signs SmartPhrase below at the top of the HPI. See below.  2)Review and Update Medications, Allergies PMH, Surgeries, Social history in Epic 3)Hospitalizations in the last year with date/reason? no  4)Review and Update Care Team (patient's specialists) in Epic 5) Complete PHQ9 in Epic  6) Complete Fall Screening in Epic 7)Review all Health Maintenance Due and order if not done.  Medicare Wellness Patient Questionnaire:  Answer theses question about your habits: How often do you have a drink containing alcohol?1 monthly  How many drinks containing alcohol do you have on a typical day when you are drinking? 1-2  How often do you have six or more drinks on one occasion? N/a Have you ever smoked?no Quit date if applicable? N/a   How many packs a day do/did you smoke? N/a Do you use smokeless tobacco?n/a Do you use an illicit drugs?no On average, how many days per week do you engage in moderate to strenuous exercise (like a brisk walk)?n/a, Doing PT 2 days a week, and she is doing home exercises. 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 She is planning to restart water aerobics.  On average, how many minutes do you engage in exercise at this level?n/a Are you sexually active? No Number of partners? Admits is a stress eater. She belongs to weight watcher - thinking about getting back into that.  Typical breakfast: English muffin, banana, egg, protein Typical lunch: Sandwich and fruit Typical dinner: Varies, veggies and protein Typical snacks: Chocolate   Beverages:  Diet coke  Answer theses question about your everyday activities: Can you perform most household chores?no Are  you deaf or have significant trouble hearing?no Do you feel that you have a problem with memory?no Do you feel safe at home? Yes  Last dentist visit? 6 months ago  8. Do you have any difficulty performing your everyday activities?no Are you having any difficulty walking, taking medications on your own, and or difficulty managing daily home needs? no Do you have difficulty walking or climbing stairs?yes  Do you have difficulty dressing or bathing?no Do you have difficulty doing errands alone such as visiting a doctor's office or shopping?no Do you currently have any difficulty preparing food and eating?no Do you currently have any difficulty using the toilet?no Do you have any difficulty managing your finances?no Do you have any difficulties with housekeeping of managing your housekeeping?no   Do you have Advanced Directives in place (Living Will, Healthcare Power or Attorney)?  Yes    Last eye Exam and location? 2 weeks ago Dr. Effie Morita ophthalmology    Do you currently use prescribed or non-prescribed narcotic or opioid pain medications? no  Do you have a history or close family history of breast, ovarian, tubal or peritoneal cancer or a family member with BRCA (breast cancer susceptibility 1 and 2) gene mutations? Aunt and Grandmother Breast cancer    Nurse/Assistant Credentials/time stamp: Mg 12:03 PM    ----------------------------------------------------------------------------------------------------------------------------------------------------------------------------------------------------------------------  Because this visit was a virtual/telehealth visit, some criteria may be missing or patient reported. Any vitals not documented were not able to be obtained and vitals that have been documented are patient reported.    MEDICARE ANNUAL PREVENTIVE VISIT WITH PROVIDER: (  Welcome to [employer], initial annual wellness or annual wellness exam)  Virtual Visit via  Video Note  I connected with Gwendolyn [name] on 06/15/24 by a video enabled telemedicine application and verified that I am speaking with the correct person using two identifiers.  Location patient: home Location provider:work or home office Persons participating in the virtual visit: patient, provider  Concerns and/or follow up today: no new concerns. Seeing Dr. Theophilus next week. She is doing PT to work on hip weakness. Uses cane to avoid falls. 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    See HM section in Epic for other details of completed HM.    ROS: negative for report of fevers, unintentional weight loss, vision changes, vision loss, hearing loss or change, chest pain, sob, hemoptysis, melena, hematochezia, hematuria, falls, bleeding or bruising, thoughts of suicide or self harm, memory loss  Patient-completed extensive health risk assessment - reviewed and discussed with the patient: See Health Risk Assessment completed with patient prior to the visit either above or in recent phone note. This was reviewed in detailed with the patient today and appropriate recommendations, orders and referrals were placed as needed per Summary below and patient instructions.   Review of Medical History: -PMH, PSH, Family History and current specialty and care providers reviewed and updated and listed below   Patient Care Team: Theophilus Andrews, Tully GRADE, MD as PCP - General (Internal Medicine) Obie Princella HERO, MD (Inactive) as Consulting Physician (Gastroenterology) Austin Ned, MD as Consulting Physician (Obstetrics and Gynecology) Tobie Tonita POUR, DO as Consulting Physician (Neurology) Tat, Asberry RAMAN, DO as Consulting Physician (Neurology)   Past Medical History:  Diagnosis Date   Allergy    Arthritis    Blood transfusion without reported diagnosis    Cataract    removed both eyes    Cervical radicular pain 02/11/2016   Glaucoma    low pressure glaucoma    Hyperlipidemia     Hypertension    Osteopenia    Parkinson's disease (HCC)    Pelvis fracture (HCC) 03/26/2002   both sides fx   Positive colorectal cancer screening using Cologuard test 04/14/2017   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    Vitamin D  deficiency     Past Surgical History:  Procedure Laterality Date   COLONOSCOPY     DILATION AND CURETTAGE OF UTERUS     age 4    right elbow   surgery  March 26, 2002   right hand ulner nerve surgery  March 26, 2002   I and d done right hand/wrist   right shoulder humerus fx  july 2003   surgery done   right total hip arthroplasty  2008   right total knee arthroplasty  sept 2012   right wrist plating  2004   TONSILLECTOMY  age 80   TOTAL HIP ARTHROPLASTY  10/22/2011   Procedure: TOTAL HIP ARTHROPLASTY ANTERIOR APPROACH;  Surgeon: Donnice JONETTA Car, MD;  Location: [facility];  Service: Orthopedics;  Laterality: Left;  Left Total Hip Arthroplasty,  Anterior Approach    TOTAL KNEE ARTHROPLASTY Left 04/28/2022   Procedure: TOTAL KNEE ARTHROPLASTY;  Surgeon: Car Donnice, MD;  Location: [facility];  Service: Orthopedics;  Laterality: Left;    Social History   Socioeconomic History   Marital status: Divorced    Spouse name: Not on file   Number of children: Not on file   Years of education: Not on file  Highest education level: Associate degree: academic program  Occupational History   Occupation: retired    Comment: nursing  Tobacco Use   Smoking status: Never   Smokeless tobacco: Never  Vaping Use   Vaping status: Never Used  Substance and Sexual Activity   Alcohol use: Yes    Comment: social- rare   Drug use: No   Sexual activity: Not on file  Other Topics Concern   Not on file  Social History Narrative   Are you right handed or left handed? Left Handed    Are you currently employed ? No   What is your current occupation? Retired   Do you live at home alone? No    Who lives with you? Lives with a friend   What type of home do you live in:  1 story or 2 story? Lives in a one story home       Social Drivers of Health   Financial Resource Strain: Low Risk  (06/15/2024)   Overall Financial Resource Strain (CARDIA)    Difficulty of Paying Living Expenses: Not hard at all  Food Insecurity: No Food Insecurity (06/15/2024)   Hunger Vital Sign    Worried About Running Out of Food in the Last Year: Never true    Ran Out of Food in the Last Year: Never true  Transportation Needs: No Transportation Needs (06/15/2024)   PRAPARE - Administrator, Civil Service (Medical): No    Lack of Transportation (Non-Medical): No  Physical Activity: Inactive (06/15/2024)   Exercise Vital Sign    Days of Exercise per Week: 0 days    Minutes of Exercise per Session: Not on file  Stress: No Stress Concern Present (06/15/2024)   [employer] of Occupational Health - Occupational Stress Questionnaire    Feeling of Stress: Only a little  Social Connections: Socially Integrated (06/15/2024)   Social Connection and Isolation Panel    Frequency of Communication with Friends and Family: Three times a week    Frequency of Social Gatherings with Friends and Family: Once a week    Attends Religious Services: More than 4 times per year    Active Member of [employer] or Organizations: Yes    Attends Banker Meetings: 1 to 4 times per year    Marital Status: Living with partner  Intimate Partner Violence: Not At Risk (06/15/2024)   Humiliation, Afraid, Rape, and Kick questionnaire    Fear of Current or Ex-Partner: No    Emotionally Abused: No    Physically Abused: No    Sexually Abused: No    Family History  Problem Relation Age of Onset   Hypertension Mother    Diabetes Mother    Heart disease Mother        enlarged heart   Atrial fibrillation Mother    Congestive Heart Failure Mother    Diabetes Father    Heart disease Father    Heart attack Father 36   Tremor Father    Atrial fibrillation Brother    Atrial  fibrillation Brother    Breast cancer Maternal Grandmother        early 21's   Tremor Paternal Grandmother    Breast cancer Maternal Aunt        early 85's   Breast cancer Paternal Aunt        44's   Colon cancer Neg Hx    Esophageal cancer Neg Hx    Rectal cancer Neg Hx  Stomach cancer Neg Hx    Pancreatic cancer Neg Hx     Current Outpatient Medications on File Prior to Visit  Medication Sig Dispense Refill   ALPRAZolam  (XANAX ) 0.5 MG tablet Take 1 tablet (0.5 mg total) by mouth at bedtime as needed for anxiety. 30 tablet 0   ascorbic acid (VITAMIN C) 500 MG tablet Take 500 mg by mouth daily.     carbidopa -levodopa  (SINEMET  IR) 25-100 MG tablet TAKE 1 TABLET BY MOUTH 3 TIMES A DAY **TAKE AT 7AM, 11AM, AND 4PM** 270 tablet 0   CHELATED MAGNESIUM PO Take 250 mg by mouth 2 (two) times daily.     cholecalciferol  (VITAMIN D3) 25 MCG (1000 UNIT) tablet Take 1,000 Units by mouth in the morning and at bedtime.     CRANBERRY PO Take by mouth daily.     cyanocobalamin  (VITAMIN B12) 1000 MCG tablet Take 1,000 mcg by mouth daily.     furosemide  (LASIX ) 40 MG tablet Take 1 tablet (40 mg total) by mouth as needed for edema. 30 tablet 3   losartan -hydrochlorothiazide  (HYZAAR) 50-12.5 MG tablet TAKE ONE TABLET BY MOUTH DAILY FOR BLOOD PRESSURE 90 tablet 1   Naproxen Sodium (ALEVE PO) Take by mouth every other day.     pregabalin  (LYRICA ) 150 MG capsule TAKE 1 CAPSULE BY MOUTH AT BEDTIME FOR SLEEP AND CHRONIC PAIN 90 capsule 0   rosuvastatin  (CRESTOR ) 20 MG tablet Take 1 tablet MWF for Cholesterol 36 tablet 1   timolol  (TIMOPTIC ) 0.5 % ophthalmic solution Place 1 drop into both eyes every morning.      tizanidine  (ZANAFLEX ) 2 MG capsule Take 1 capsule (2 mg total) by mouth at bedtime as needed for muscle spasms (low back pain). 30 capsule 1   No current facility-administered medications on file prior to visit.    Allergies  Allergen Reactions   Oxycodone-Acetaminophen  Other (See Comments)     panic attack   Ibuprofen Hives   Macrodantin [Nitrofurantoin Macrocrystal] Rash       Physical Exam Vitals requested from patient and listed below if patient had equipment and was able to obtain at home for this virtual visit: There were no vitals filed for this visit. Estimated body mass index is 29.29 kg/m as calculated from the following:   Height as of 04/10/24: 5' 5 (1.651 m).   Weight as of 04/10/24: 176 lb (79.8 kg).  EKG (optional): deferred due to virtual visit  GENERAL: alert, oriented, no acute distress detected, full vision exam deferred due to pandemic and/or virtual encounter   HEENT: atraumatic, conjunttiva clear, no obvious abnormalities on inspection of external nose and ears  NECK: normal movements of the head and neck  LUNGS: on inspection no signs of respiratory distress, breathing rate appears normal, no obvious gross SOB, gasping or wheezing  CV: no obvious cyanosis  MS: moves all visible extremities without noticeable abnormality  PSYCH/NEURO: pleasant and cooperative, no obvious depression or anxiety, speech and thought processing grossly intact, Cognitive function grossly intact  Flowsheet Row Clinical Support from 06/15/2024 in [facility]  PHQ-9 Total Score 1        06/15/2024   11:56 AM 04/14/2023   10:29 AM 12/17/2022   12:30 AM 05/21/2022   10:37 PM 02/12/2022    9:51 AM  Depression screen PHQ 2/9  Decreased Interest 0 0 0 0 0  Down, Depressed, Hopeless 0 0 0 0 1  PHQ - 2 Score 0 0 0 0 1  Altered sleeping 1      Tired, decreased energy 0      Change in appetite 0      Feeling bad or failure about yourself  0      Trouble concentrating 0      Moving slowly or fidgety/restless 0      Suicidal thoughts 0      PHQ-9 Score 1      Difficult doing work/chores Not difficult at all           10/12/2023    9:41 AM 01/04/2024    3:30 PM 04/10/2024   10:35 AM 06/12/2024   12:52 PM 06/15/2024   11:56 AM  Fall  Risk  Falls in the past year? 0 0 0 0 0  Was there an injury with Fall? 0 0 0 0 0  Fall Risk Category Calculator 0 0 0 0  0  Patient at Risk for Falls Due to     No Fall Risks  Fall risk Follow up Falls evaluation completed Falls evaluation completed Falls evaluation completed  Falls evaluation completed     Patient-reported     SUMMARY AND PLAN:  Encounter for Medicare annual wellness exam  Discussed applicable health maintenance/preventive health measures and advised and referred or ordered per patient preferences: -she plans to get her flu and covid vaccines next week at the pharmacy, asked that she let us  know when she does so that we can update her record -she had normal dexa in 2022, declined for now -has in office visit next week and plans to get labs then.  Health Maintenance  Topic Date Due   Influenza Vaccine  03/31/2024   COVID-19 Vaccine (6 - 2025-26 season) 05/01/2024   Mammogram  01/12/2025   Medicare Annual Wellness (AWV)  06/15/2025   DTaP/Tdap/Td (3 - Td or Tdap) 02/13/2032   Pneumococcal Vaccine: 50+ Years  Completed   DEXA SCAN  Completed   Zoster Vaccines- Shingrix  Completed   Meningococcal B Vaccine  Aged Out      Education and counseling on the following was provided based on the above review of health and a plan/checklist for the patient, along with additional information discussed, was provided for the patient in the patient instructions :   -Advised and counseled on a healthy lifestyle - including the importance of a healthy diet, regular physical activity, social connections -Reviewed patient's current diet. Advised and counseled on a whole foods based healthy diet. A summary of a healthy diet was provided in the Patient Instructions.  -reviewed patient's current physical activity level and discussed exercise guidelines for adults. She plans to get back into water aerobic which would be excellent. Further information provided in patient instructions.   -Advise yearly dental visits at minimum and regular eye exams -Advise to limit alcohol to no more than 1 alcoholic drink per day.   Follow up: see patient instructions     Patient Instructions  I really enjoyed getting to talk with you today! I am available on Tuesdays and Thursdays for virtual visits if you have any questions or concerns, or if I can be of any further assistance.   CHECKLIST FROM ANNUAL WELLNESS VISIT:  -Follow up (please call to schedule if not scheduled after visit):   -yearly for annual wellness visit with primary care office  Here is a list of your preventive care/health maintenance measures and the plan for each if any are due:  PLAN For any measures below that may be  due:    1. Can get the flu shot at the office or at the pharmacy. Can get covid vaccine at the pharmacy. Please let us  know when you get any vaccines at the pharmacy so that we can update your record accurately.   Health Maintenance  Topic Date Due   Influenza Vaccine  03/31/2024   Medicare Annual Wellness (AWV)  04/13/2024   COVID-19 Vaccine (6 - 2025-26 season) 05/01/2024   Mammogram  01/12/2025   DTaP/Tdap/Td (3 - Td or Tdap) 02/13/2032   Pneumococcal Vaccine: 50+ Years  Completed   DEXA SCAN  Completed   Zoster Vaccines- Shingrix  Completed   Meningococcal B Vaccine  Aged Out    -See a dentist at least yearly  -Get your eyes checked and then per your eye specialist's recommendations  -Other issues addressed today:   1. Please limit alcohol to no more than 1 alcoholic drink per day.   -I have included below further information regarding a healthy whole foods based diet, physical activity guidelines for adults, stress management and opportunities for social connections. I hope you find this information useful.    -----------------------------------------------------------------------------------------------------------------------------------------------------------------------------------------------------------------------------------------------------------    NUTRITION: -eat real food: lots of colorful vegetables (half the plate) and fruits -5-7 servings of vegetables and fruits per day (fresh or steamed is best), exp. 2 servings of vegetables with lunch and dinner and 2 servings of fruit per day. Berries and greens such as kale and collards are great choices.  -consume on a regular basis:  fresh fruits, fresh veggies, fish, nuts, seeds, healthy oils (such as olive oil, avocado oil), whole grains (make sure for bread/pasta/crackers/etc., that the first ingredient on label contains the word whole), legumes. -can eat small amounts of dairy and lean meat (no larger than the palm of your hand), but avoid processed meats such as ham, bacon, lunch meat, etc. -drink water -try to avoid fast food and pre-packaged foods, processed meat, ultra processed foods/beverages (donuts, candy, etc.) -most experts advise limiting sodium to < 2300mg  per day, should limit further is any chronic conditions such as high blood pressure, heart disease, diabetes, etc. The American Heart Association advised that < 1500mg  is is ideal -try to avoid foods/beverages that contain any ingredients with names you do not recognize  -try to avoid foods/beverages  with added sugar or sweeteners/sweets  -try to avoid sweet drinks (including diet drinks): soda, juice, Gatorade, sweet tea, power drinks, diet drinks -try to avoid white rice, white bread, pasta (unless whole grain)  EXERCISE GUIDELINES FOR ADULTS: -if you wish to increase your physical activity, do so gradually and with the approval of your doctor -STOP and seek medical care immediately if you have any chest pain, chest discomfort or trouble breathing when starting or  increasing exercise  -move and stretch your body, legs, feet and arms when sitting for long periods -Physical activity guidelines for optimal health in adults: -get at least 150 minutes per week of moderate exercise (can talk, but not sing); this is about 20-30 minutes of sustained activity 5-7 days per week or two 10-15 minute episodes of sustained activity 5-7 days per week -do some muscle building/resistance training/strength training at least 2 days per week  -balance exercises 3+ days per week:   Stand somewhere where you have something sturdy to hold onto if you lose balance    1) lift up on toes, then back down, start with 5x per day and work up to 20x   2) stand and lift one  leg straight out to the side so that foot is a few inches of the floor, start with 5x each side and work up to 20x each side   3) stand on one foot, start with 5 seconds each side and work up to 20 seconds on each side  If you need ideas or help with getting more active:  -Silver sneakers https://tools.silversneakers.com  -Walk with a Doc: http://www.duncan-williams.com/  -try to include resistance (weight lifting/strength building) and balance exercises twice per week: or the following link for ideas: http://castillo-powell.com/  BuyDucts.dk  STRESS MANAGEMENT: -can try meditating, or just sitting quietly with deep breathing while intentionally relaxing all parts of your body for 5 minutes daily -if you need further help with stress, anxiety or depression please follow up with your primary doctor or contact the wonderful folks at [facility]: [phone]  SOCIAL CONNECTIONS: -options in [place] if you wish to engage in more social and exercise related activities:  -Silver sneakers https://tools.silversneakers.com  -Walk with a Doc: http://www.duncan-williams.com/  -Check out the [facility] Active Adults 50+  section on the [place] of [employer] (hiking clubs, book clubs, cards and games, chess, exercise classes, aquatic classes and much more) - see the website for details: https://www.[place].gov/departments/parks-recreation/active-adults50  -YouTube has lots of exercise videos for different ages and abilities as well  -[facility] (a variety of indoor and outdoor inperson activities for adults). [phone]) [phone]. [address]  -Virtual Online Classes (a variety of topics): see seniorplanet.org or call 956-513-6837  -consider volunteering at a school, hospice center, church, senior center or elsewhere            Chiquita JONELLE Cramp, DO

## 2024-06-19 ENCOUNTER — Ambulatory Visit

## 2024-06-21 ENCOUNTER — Ambulatory Visit

## 2024-06-22 ENCOUNTER — Encounter: Payer: Self-pay | Admitting: Internal Medicine

## 2024-06-22 ENCOUNTER — Ambulatory Visit: Admitting: Internal Medicine

## 2024-06-22 VITALS — BP 128/78 | HR 70 | Temp 97.9°F | Wt 178.9 lb

## 2024-06-22 DIAGNOSIS — E559 Vitamin D deficiency, unspecified: Secondary | ICD-10-CM

## 2024-06-22 DIAGNOSIS — F411 Generalized anxiety disorder: Secondary | ICD-10-CM | POA: Diagnosis not present

## 2024-06-22 DIAGNOSIS — E782 Mixed hyperlipidemia: Secondary | ICD-10-CM

## 2024-06-22 DIAGNOSIS — Z23 Encounter for immunization: Secondary | ICD-10-CM | POA: Diagnosis not present

## 2024-06-22 DIAGNOSIS — G20A1 Parkinson's disease without dyskinesia, without mention of fluctuations: Secondary | ICD-10-CM

## 2024-06-22 DIAGNOSIS — E538 Deficiency of other specified B group vitamins: Secondary | ICD-10-CM | POA: Diagnosis not present

## 2024-06-22 DIAGNOSIS — I10 Essential (primary) hypertension: Secondary | ICD-10-CM | POA: Diagnosis not present

## 2024-06-22 NOTE — Addendum Note (Signed)
 Addended by: KATHRYNE MILLMAN B on: 06/22/2024 04:53 PM   Modules accepted: Orders

## 2024-06-22 NOTE — Assessment & Plan Note (Signed)
 Well-controlled, on rosuvastatin  20 mg daily.

## 2024-06-22 NOTE — Assessment & Plan Note (Signed)
 Check levels with next lab draw.

## 2024-06-22 NOTE — Assessment & Plan Note (Signed)
 Well-controlled on current.

## 2024-06-22 NOTE — Assessment & Plan Note (Signed)
 Followed by neurology, on Sinemet .

## 2024-06-22 NOTE — Progress Notes (Signed)
 Established Patient Office Visit     CC/Reason for Visit: Follow-up chronic conditions  HPI: Gwendolyn [name] is a 83 y.o. female who is coming in today for the above mentioned reasons. Past Medical History is significant for: Parkinson's disease followed by neurology, hypertension, hyperlipidemia, glaucoma followed by ophthalmology, generalized anxiety disorder, vitamin D  and B12 deficiencies.  She is feeling well.  Requesting a flu vaccine.  She is currently undergoing physical therapy for some right hip issues.   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    Vitamin D  deficiency     Past Surgical History:  Procedure Laterality Date   COLONOSCOPY     DILATION AND CURETTAGE OF UTERUS     age 41    right elbow   surgery  March 26, 2002   right hand ulner nerve surgery  March 26, 2002   I and d done right hand/wrist   right shoulder humerus fx  july 2003   surgery done   right total hip arthroplasty  2008   right total knee arthroplasty  sept 2012   right wrist plating  2004   TONSILLECTOMY  age 89   TOTAL HIP ARTHROPLASTY  10/22/2011   Procedure: TOTAL HIP ARTHROPLASTY ANTERIOR APPROACH;  Surgeon: Donnice JONETTA Car, MD;  Location: [facility];  Service: Orthopedics;  Laterality: Left;  Left Total Hip Arthroplasty,  Anterior Approach    TOTAL KNEE ARTHROPLASTY Left 04/28/2022   Procedure: TOTAL KNEE ARTHROPLASTY;  Surgeon: Car Donnice, MD;  Location: [facility];  Service: Orthopedics;  Laterality: Left;    Social History:  reports  that she has never smoked. She has never used smokeless tobacco. She reports current alcohol use. She reports that she does not use drugs.  Allergies: Allergies  Allergen Reactions   Oxycodone-Acetaminophen  Other (See Comments)    panic attack   Ibuprofen Hives   Macrodantin [Nitrofurantoin Macrocrystal] Rash    Family History:  Family History  Problem Relation Age of Onset   Hypertension Mother    Diabetes Mother    Heart disease Mother        enlarged heart   Atrial fibrillation Mother    Congestive Heart Failure Mother    Diabetes Father    Heart disease Father    Heart attack Father 31   Tremor Father    Atrial fibrillation Brother    Atrial fibrillation Brother    Breast cancer Maternal Grandmother        early 3's   Tremor Paternal Grandmother    Breast cancer Maternal Aunt        early 53's   Breast cancer Paternal Aunt        58's   Colon cancer Neg Hx    Esophageal cancer Neg Hx    Rectal cancer Neg Hx    Stomach cancer Neg Hx    Pancreatic cancer Neg Hx      Current Outpatient Medications:  ALPRAZolam  (XANAX ) 0.5 MG tablet, Take 1 tablet (0.5 mg total) by mouth at bedtime as needed for anxiety., Disp: 30 tablet, Rfl: 0   ascorbic acid (VITAMIN C) 500 MG tablet, Take 500 mg by mouth daily., Disp: , Rfl:    carbidopa -levodopa  (SINEMET  IR) 25-100 MG tablet, TAKE 1 TABLET BY MOUTH 3 TIMES A DAY **TAKE AT 7AM, 11AM, AND 4PM**, Disp: 270 tablet, Rfl: 0   CHELATED MAGNESIUM PO, Take 250 mg by mouth 2 (two) times daily., Disp: , Rfl:    cholecalciferol  (VITAMIN D3) 25 MCG (1000 UNIT) tablet, Take 1,000 Units by mouth in the morning and at bedtime., Disp: , Rfl:    CRANBERRY PO, Take by mouth daily., Disp: , Rfl:    cyanocobalamin  (VITAMIN B12) 1000 MCG tablet, Take 1,000 mcg by mouth daily., Disp: , Rfl:    furosemide  (LASIX ) 40 MG tablet, Take 1 tablet (40 mg total) by mouth as needed for edema., Disp: 30 tablet, Rfl: 3   losartan -hydrochlorothiazide  (HYZAAR)  50-12.5 MG tablet, TAKE ONE TABLET BY MOUTH DAILY FOR BLOOD PRESSURE, Disp: 90 tablet, Rfl: 1   Naproxen Sodium (ALEVE PO), Take by mouth every other day., Disp: , Rfl:    pregabalin  (LYRICA ) 150 MG capsule, TAKE 1 CAPSULE BY MOUTH AT BEDTIME FOR SLEEP AND CHRONIC PAIN, Disp: 90 capsule, Rfl: 0   rosuvastatin  (CRESTOR ) 20 MG tablet, Take 1 tablet MWF for Cholesterol, Disp: 36 tablet, Rfl: 1   timolol  (TIMOPTIC ) 0.5 % ophthalmic solution, Place 1 drop into both eyes every morning. , Disp: , Rfl:    tizanidine  (ZANAFLEX ) 2 MG capsule, Take 1 capsule (2 mg total) by mouth at bedtime as needed for muscle spasms (low back pain)., Disp: 30 capsule, Rfl: 1  Review of Systems:  Negative unless indicated in HPI.   Physical Exam: Vitals:   06/22/24 1342 06/22/24 1352  BP: 130/80 128/78  Pulse: 70   Temp: 97.9 F (36.6 C)   TempSrc: Oral   SpO2: 95%   Weight: 178 lb 14.4 oz (81.1 kg)     Body mass index is 29.77 kg/m.   Physical Exam Vitals reviewed.  Constitutional:      Appearance: Normal appearance.  HENT:     Head: Normocephalic and atraumatic.  Eyes:     Conjunctiva/sclera: Conjunctivae normal.     Pupils: Pupils are equal, round, and reactive to light.  Cardiovascular:     Rate and Rhythm: Normal rate and regular rhythm.  Pulmonary:     Effort: Pulmonary effort is normal.     Breath sounds: Normal breath sounds.  Skin:    General: Skin is warm and dry.  Neurological:     General: No focal deficit present.     Mental Status: She is alert and oriented to person, place, and time.  Psychiatric:        Mood and Affect: Mood normal.        Behavior: Behavior normal.        Thought Content: Thought content normal.        Judgment: Judgment normal.      Impression and Plan:  Essential hypertension Assessment & Plan: Well-controlled on current.   Hyperlipidemia, mixed Assessment & Plan: Well-controlled, on rosuvastatin  20 mg daily.   Parkinson's disease without  dyskinesia or fluctuating manifestations (HCC) Assessment & Plan: Followed by neurology, on Sinemet .   Generalized anxiety disorder  Vitamin D  deficiency Assessment & Plan: Check levels with next lab draw.   B12 deficiency Assessment &  Plan: Check levels with next visit.   Immunization due   - Flu vaccine given in office today.  Time spent:31 minutes reviewing chart, interviewing and examining patient and formulating plan of care.     Tully Theophilus Andrews, MD [facility]

## 2024-06-22 NOTE — Assessment & Plan Note (Signed)
 Check levels with next visit.

## 2024-06-24 ENCOUNTER — Other Ambulatory Visit: Payer: Self-pay | Admitting: Neurology

## 2024-06-24 DIAGNOSIS — G20A1 Parkinson's disease without dyskinesia, without mention of fluctuations: Secondary | ICD-10-CM

## 2024-06-29 ENCOUNTER — Ambulatory Visit: Admitting: Physical Therapy

## 2024-06-29 ENCOUNTER — Encounter: Payer: Self-pay | Admitting: Physical Therapy

## 2024-06-29 DIAGNOSIS — R2689 Other abnormalities of gait and mobility: Secondary | ICD-10-CM

## 2024-06-29 DIAGNOSIS — M5459 Other low back pain: Secondary | ICD-10-CM

## 2024-06-29 DIAGNOSIS — M6281 Muscle weakness (generalized): Secondary | ICD-10-CM

## 2024-06-29 NOTE — Therapy (Signed)
 Progress Note Reporting Period 8/27 to 10/30  See note below for Objective Data and Assessment of Progress/Goals.       Patient Name: Gwendolyn [name] MRN: [id number] DOB:[birth year], 83 y.o., female Today's Date: 06/29/2024  END OF SESSION:  PT End of Session - 06/29/24 1330     Visit Number 13    Number of Visits 16    Date for Recertification  08/24/24    Authorization Type HTA    PT Start Time 1330    PT Stop Time 1411    PT Time Calculation (min) 41 min    Activity Tolerance Patient tolerated treatment well    Behavior During Therapy [facility] for tasks assessed/performed              Past Medical History:  Diagnosis Date   Allergy    Arthritis    Blood transfusion without reported diagnosis    Cataract    removed both eyes    Cervical radicular pain 02/11/2016   Glaucoma    low pressure glaucoma    Hyperlipidemia    Hypertension    Osteopenia    Parkinson's disease (HCC)    Pelvis fracture (HCC) 03/26/2002   both sides fx   Positive colorectal cancer screening using Cologuard test 04/14/2017   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    Vitamin D  deficiency    Past Surgical History:  Procedure Laterality Date   COLONOSCOPY     DILATION AND CURETTAGE OF UTERUS     age 47    right elbow   surgery  March 26, 2002   right hand ulner nerve surgery  March 26, 2002   I and d done right hand/wrist   right shoulder humerus fx  july 2003   surgery done   right total hip arthroplasty  2008   right total knee arthroplasty  sept 2012   right wrist plating  2004   TONSILLECTOMY  age 83   TOTAL HIP ARTHROPLASTY  10/22/2011   Procedure: TOTAL HIP ARTHROPLASTY ANTERIOR APPROACH;  Surgeon: Donnice JONETTA Car, MD;  Location: [facility];  Service: Orthopedics;  Laterality: Left;  Left Total Hip Arthroplasty,  Anterior Approach    TOTAL KNEE ARTHROPLASTY Left 04/28/2022   Procedure: TOTAL KNEE ARTHROPLASTY;  Surgeon: Car Donnice, MD;  Location: [facility];  Service: Orthopedics;   Laterality: Left;   Patient Active Problem List   Diagnosis Date Noted   S/P total knee arthroplasty, 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   Parkinson's disease (HCC) 11/28/2021   B12 deficiency 05/28/2021   Cervical arthritis 09/27/2017   Open-angle glaucoma 05/14/2016   Generalized anxiety disorder 02/11/2016   Overweight (BMI 25.0-29.9) 04/25/2015   Medication management 02/12/2014   Essential hypertension 08/08/2013   Hyperlipidemia, mixed 08/08/2013   Abnormal glucose 08/08/2013   Vitamin D  deficiency 08/08/2013    PCP: Theophilus Andrews, Tully GRADE, MD   REFERRING PROVIDER: Patel, Donika K, DO  REFERRING DIAG: [phone] (ICD-10-CM) - Chronic bilateral low back pain without sciatica  Rationale for Evaluation and Treatment: Rehabilitation  THERAPY DIAG:  Other low back pain - Plan: PT plan of care cert/re-cert  Muscle weakness (generalized) - Plan: PT plan of care cert/re-cert  Other abnormalities of gait and mobility - Plan: PT plan of care cert/re-cert  ONSET DATE: chronic  SUBJECTIVE:  SUBJECTIVE STATEMENT: Pt reports that things are about the same.     EVAL: In 2022, she recalls noticing that she began walking with a limp.  She has low back pain sometimes at the end of the day.  She starting using a cane around the same time.  She had left knee replacement in 2023 and continued to walk with a limp.  She is very active and continues to go to balance class and water exercise.  She noticed that if she sits too long, she has pressure in the low back.  She has weakness in the right leg and has some difficulty with stairs and has to manually lift her right leg to get it into her car.  She denies weakness in the right leg or arms.  No problems with speech/swallow.  No numbness/tingling of the arms or legs.      She is retired engineer, civil (consulting) from the surgical center.  She worked full-time until she was 27. She was very independent and kept up with her yard work until 2 years ago when her right leg started to get weak.  Fortunately, she has no falls except a mechanical one when she lost balance while trying to push a stake into the ground.     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 MRI lumbar spine showed left foraminal stenosis at L2-3 and L3-4.  Per my review, there is atrophy of the paraspinal muscles.  EMG shows neurogenic changes involving rectus femoris, adductor longus, iliopsoas, and gluteus medius muscles.  No evidence of myopathy or active denervation.     UPDATE 04/10/2024:  She has low back pain and achy pain on top of her feet which is worse with prolonged activity.  She gets relief with tramadol. She uses a cane for support. 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  There has been no change in her leg weakness.  She continues to feel that at times, her foot will turn inwards.  Fortunately, she has not had any falls.   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  Myopathy is less likely given normal CK and no findings of myopathy on EMG.  EMG showed diffuse neurogenic changes involving the hip muscles without active denervation. MRI of the hip shows severe muscle atrophy of the iliopsoas, gluteus medius, and gluteus minimus along with paraspinal muscle atrophy.  There is no evidence of hyperreflexia or increased tone to suggest UMN pathology. Previously discussed additional testing such as CSF testing or muscle biopsy, and opted to monitor due to stability of symptoms.  PAIN:  Are you having pain? Yes: NPRS scale: 5/10 Pain location: low back Pain description: ache Aggravating factors: position changes Relieving factors: sitting   PRECAUTIONS:  None  RED FLAGS: None   WEIGHT BEARING RESTRICTIONS: No  FALLS:  Has patient fallen in last 6 months? No  OCCUPATION: retired  PLOF: Independent  PATIENT GOALS: To manage my low back pain  NEXT MD VISIT: TBD  OBJECTIVE:  Note: Objective measures were completed at Evaluation unless otherwise noted.  DIAGNOSTIC FINDINGS:  IMPRESSION: 1. 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 2. 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 3. Left lateral recess narrowing at L1-2 and L2-3, which could cause left L2 and L3 radiculopathy.     Electronically Signed   By:  Franky Stanford M.D.   On: 03/17/2023 01:53  PATIENT SURVEYS:  Modified Oswestry:   Interpretation of scores: Score Category Description  0-20% Minimal Disability The patient can cope with most living activities. Usually no treatment is indicated apart from advice on lifting, sitting and exercise  21-40% Moderate Disability The patient experiences more pain and difficulty with sitting, lifting and standing. Travel and social life are more difficult and they may be disabled from work. Personal care, sexual activity and sleeping are not grossly affected, and the patient can usually be managed by conservative means  41-60% Severe Disability Pain remains the main problem in this group, but activities of daily living are affected. These patients require a detailed investigation  61-80% Crippled Back pain impinges on all aspects of the patient's life. Positive intervention is required  81-100% Bed-bound  These patients are either bed-bound or exaggerating their symptoms  Bluford FORBES Zoe DELENA Karon DELENA, et al. Surgery versus conservative management of stable thoracolumbar fracture: the PRESTO feasibility RCT. [place] ([place]): [employer]; 2021 Nov. [facility] Technology Assessment, No. 25.62.) Appendix 3, Oswestry Disability Index category descriptors. Available from:  Findjewelers.cz  Minimally Clinically Important Difference (MCID) = 12.8%   MUSCLE LENGTH: Hamstrings: Right 70 deg; Left 70 deg   POSTURE: decreased lumbar lordosis and flexed hip posture, knee valgus   PALPATION: deferred  LUMBAR ROM:   AROM eval  Flexion 90%  Extension 10%  Right lateral flexion 50%  Left lateral flexion 50%  Right rotation   Left rotation    (Blank rows = not tested)  LOWER EXTREMITY ROM:   WFL throughout  Active  Right eval Left eval  Hip flexion    Hip extension    Hip abduction    Hip adduction    Hip internal rotation    Hip external rotation    Knee flexion    Knee extension    Ankle dorsiflexion    Ankle plantarflexion    Ankle inversion    Ankle eversion     (Blank rows = not tested)  LOWER EXTREMITY MMT:  see 30s chair stand test  MMT Right eval Left eval 06/12/24 L 10/30 R/L  Hip flexion   4- 4-/4  Hip extension   4- /  Hip abduction   3 2+/3-  Hip adduction    4-/4  Hip internal rotation    4/5  Hip external rotation    3/4  Knee flexion    4/4  Knee extension    4/4  Ankle dorsiflexion      Ankle plantarflexion      Ankle inversion      Ankle eversion       (Blank rows = not tested)  LUMBAR SPECIAL TESTS:  Straight leg raise test: Negative and Slump test: Negative  FUNCTIONAL TESTS:  30 seconds chair stand test 5 reps  GAIT: Distance walked: 50ft x2 Assistive device utilized: Single point cane Level of assistance: Complete Independence Comments: slow cadence  TREATMENT:   [facility] Adult PT Treatment:                                                DATE: 06/29/24 Therapeutic Exercise: Side clam  Therapeutic Activity: Standing march  Standing hip abd Discussion on role of lateral hip strength in gait Checking goals and reviewing with Pt  PATIENT EDUCATION:   Education details: Discussed eval findings, rehab rationale and POC and patient is in agreement  Person educated: Patient Education method: Explanation and Handouts Education comprehension: verbalized understanding and needs further education  HOME EXERCISE PROGRAM: Access Code: [email or web address] URL: [email or web address] Date: 06/14/2024 Prepared by: Helene Gasmen  Exercises - Supine Bridge with Resistance Band  - 1-2 x daily - 5 x weekly - 3 sets - 10 reps - Hooklying Single Leg Bent Knee Fallouts with Resistance  - 1-2 x daily - 5 x weekly - 3 sets - 10 reps - Static Prone on Elbows  - 1-2 x daily - 5 x weekly - 1 sets - 2 min hold - Sit to Stand Without Arm Support  - 1 x daily - 4-7 x weekly - 3 sets - 5-10 reps - Standing March with Counter Support  - 1 x daily - 7 x weekly - 2 sets - 10 reps - Forward Step Touch  - 1 x daily - 7 x weekly - 3 sets - 10 reps - Sideways Step Touch  - 1 x daily - 7 x weekly - 3 sets - 10 reps  ASSESSMENT:  CLINICAL IMPRESSION:  Upon goal check pt has made good progress toward all short and long term goals.  She has doubled her 56'' STS time, improved her 2 MWT, and is better able to manage her LBP.  Remaining deficits include R hip weakness and resulting gait deviations that are likely contributing to LBP and increased fall risk.  Isolated weakness of R hip abd and ER in closed and open chain should be addressed to improve safety, comfort, and functional independence.    EVAL: Patient is a 83 y.o. female who was seen today for physical therapy evaluation and treatment for chronic low back pain and limited mobility due to underlying degenerative changes including stenosis.  Imaging studies show atrophy of lumbopelvic musculature.  BLE ROM and flexibility is functional and no neural tension signs noted.  Patient is a good candidate for OPPT to include aquatic PT, core strengthening and lumbar spine flexibility tasks.  OBJECTIVE IMPAIRMENTS:  Abnormal gait, decreased activity tolerance, decreased coordination, decreased mobility, difficulty walking, decreased ROM, decreased strength, impaired perceived functional ability, improper body mechanics, postural dysfunction, and pain.   ACTIVITY LIMITATIONS: carrying, lifting, bending, standing, sleeping, transfers, and bed mobility  PERSONAL FACTORS: Age, Fitness, Past/current experiences, and Time since onset of injury/illness/exacerbation are also affecting patient's functional outcome.   REHAB POTENTIAL: Good  CLINICAL DECISION MAKING: Evolving/moderate complexity  EVALUATION COMPLEXITY: Moderate   GOALS: Goals reviewed with patient? No  SHORT TERM GOALS: Target date: 05/24/2024  Patient to demonstrate independence in HEP  Baseline: F9MG [facility] Goal status: MET  2.  Assess 2 MWT  Baseline: TBD; 05/18/24 53/56 Goal status: MET  3.  Assess BERG for balance deficits Baseline: TBD; 05/18/24 53/56 Goal status: MET  LONG TERM GOALS: Target date: 06/21/2024 extended 08/24/2024   Patient will acknowledge 4/10 pain at least once during episode of care   Baseline: 5/10 10/30: can reach 4/10 Goal status: MET  2.  Patient will score at least 10/50 on ODI to signify clinically meaningful improvement in functional abilities.   Baseline: 16/50 10/30: 4/50 Goal status: MET  3.  Patient will increase 30s chair stand reps from 5 to 8 without arms to demonstrate and improved functional ability with less pain/difficulty as well as reduce fall risk.  Baseline: 5 10/30: 10 Goal status: MET  4.  Assess BERG  goal as needed Baseline: TBD; no need Goal status: MET  5.  Assess 2 MWT for progress Baseline: 212ft  10/30:  308 Goal status: ODI  6.  Gwendolyn [name] will demonstrate significant improvement in R compensated trendelenburg gait pattern  Evaluation/Baseline: significant deviation Goal status: NEW  7.  Gwendolyn [name] will be able to stand for >20'' in R SLS stance, to show a significant  improvement in balance in order to reduce fall risk   Evaluation/Baseline: 4'' R, 15''+ L Goal status: NEW  8.  Gwendolyn [name] will improve the following MMTs to >/= 4/5 to show improvement in strength:  R hip ER and abd   Evaluation/Baseline: see chart in note Goal status: NEW   PLAN:  PT FREQUENCY: 1-2x/week  PT DURATION: 8 weeks  PLANNED INTERVENTIONS: 97110-Therapeutic exercises, 97530- Therapeutic activity, 97112- Neuromuscular re-education, 97535- Self Care, 02859- Manual therapy, [id number]- Gait training, [phone]- Aquatic Therapy, [phone] (1-2 muscles), [id number] (3+ muscles)- Dry Needling, Patient/Family education, Balance training, and Stair training.  PLAN FOR NEXT SESSION: HEP review and update, manual techniques as appropriate, aerobic tasks, ROM and flexibility activities, strengthening and PREs, TPDN, gait and balance training as needed     Helene FORBES Gasmen, PT 06/29/2024, 3:46 PM

## 2024-07-10 NOTE — Progress Notes (Unsigned)
 Assessment/Plan:   1.  Parkinsons Disease  -DaTscan  was abnormal November 25, 2021 with decreased radiotracer activity bilaterally, right greater than left.  -she will continue carbidopa /levodopa  25/100, 1 tablet 3 times per day.  -discussed [employer] shoes  - Discussed caregiver stress.  2.  B12 deficiency  -Now on oral supplementation.  3.  Severe atrophy - R iliopsoas, R glut medius and minimus  -pt thinks sx's x close to 3 years with balance/ambulation issues  -EMG without evidence of myopathy.EMG demonstrated neurogenic changes involving rectus femoris, adductor longus, iliopsoas and gluteus medius muscles.  Dr. Tobie and patient discussed CSF testing/muscle biopsy testing, but both agreed to hold off on those things as yield was low.  She is following every 6 months with Dr. Tobie. 4.  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  My previous records were reviewed prior to todays visit as well as outside records available to me.  Patient is taking her levodopa  faithfully.  She has had no falls.  No lightheadedness or near syncope.  She has recently completed physical therapy and those notes are available and reviewed.  This was primarily for right hip and low back pain.  She saw her primary care physician October 23.  Current prescribed movement disorder medications: Carbidopa /levodopa  25/100, 1 tablet 3 times per day (7-8am/11am/4pm)   ALLERGIES:   Allergies  Allergen Reactions   Oxycodone-Acetaminophen  Other (See Comments)    panic attack   Ibuprofen Hives   Macrodantin [Nitrofurantoin Macrocrystal] Rash    CURRENT MEDICATIONS:  No outpatient medications have been marked as taking for the 07/13/24 encounter (Appointment) with Cicely Ortner, [place] RAMAN, DO.     Objective:   PHYSICAL EXAMINATION:    VITALS:    There were no vitals filed for this visit.    GEN:  The patient appears stated age and is in NAD. HEENT:  Normocephalic, AT.   The mucous membranes are moist. The superficial temporal arteries are without ropiness or tenderness. CV:  RRR Lungs:  CTAB Neck/HEME:  There are no carotid bruits bilaterally.  Neurological examination:  Orientation: The patient is alert and oriented x3. Cranial nerves: There is good facial symmetry without facial hypomimia. The speech is fluent and clear. Soft palate rises symmetrically and there is no tongue deviation. Hearing is intact to conversational tone. Sensation: Sensation is intact to light touch throughout Motor: Strength is at least antigravity x4.  Movement examination: Tone: There is nl tone b/l Abnormal movements: nl tone today Coordination:  There is no decremation, with any form of RAMS, including alternating supination and pronation of the forearm, hand opening and closing, finger taps, heel taps and toe taps. Gait and Station: The patient pushes off to arise.  Walks with cane.  She is antalgic.  She continues to have evidence of gluteal muscle weakness on the right.  No shuffling.    I have reviewed and interpreted the following labs independently    Chemistry      Component Value Date/Time   NA 139 08/02/2023 1103   K 4.3 08/02/2023 1103   CL 103 08/02/2023 1103   CO2 29 08/02/2023 1103   BUN 31 (H) 08/02/2023 1103   CREATININE 0.78 08/02/2023 1103      Component Value Date/Time   CALCIUM  9.1 08/02/2023 1103  ALKPHOS 87 03/31/2017 0927   AST 22 08/02/2023 1103   ALT 6 08/02/2023 1103   BILITOT 0.4 08/02/2023 1103       Lab Results  Component Value Date   WBC 5.6 08/02/2023   HGB 12.8 08/02/2023   HCT 39.0 08/02/2023   MCV 92.4 08/02/2023   PLT 259 08/02/2023    Lab Results  Component Value Date   TSH 1.02 01/06/2023   Total time spent on today's visit was *** minutes, including both face-to-face time and  nonface-to-face time.  Time included that spent on review of records (prior notes available to me/labs/imaging if pertinent), discussing treatment and goals, answering patient's questions and coordinating care.    Cc:  Theophilus Andrews, Tully GRADE, MD

## 2024-07-12 ENCOUNTER — Ambulatory Visit: Admitting: Physical Therapy

## 2024-07-13 ENCOUNTER — Ambulatory Visit: Admitting: Neurology

## 2024-07-13 ENCOUNTER — Encounter: Payer: Self-pay | Admitting: Neurology

## 2024-07-13 VITALS — BP 138/86 | HR 81 | Wt 183.2 lb

## 2024-07-13 DIAGNOSIS — R1319 Other dysphagia: Secondary | ICD-10-CM

## 2024-07-13 DIAGNOSIS — G20A1 Parkinson's disease without dyskinesia, without mention of fluctuations: Secondary | ICD-10-CM | POA: Diagnosis not present

## 2024-07-13 NOTE — Addendum Note (Signed)
 Addended by: MERCER CREE C on: 07/13/2024 08:42 AM   Modules accepted: Orders

## 2024-07-14 ENCOUNTER — Encounter: Payer: Self-pay | Admitting: Physical Therapy

## 2024-07-14 ENCOUNTER — Ambulatory Visit: Attending: Neurology | Admitting: Physical Therapy

## 2024-07-14 DIAGNOSIS — M5459 Other low back pain: Secondary | ICD-10-CM | POA: Insufficient documentation

## 2024-07-14 DIAGNOSIS — M6281 Muscle weakness (generalized): Secondary | ICD-10-CM | POA: Insufficient documentation

## 2024-07-14 DIAGNOSIS — R2689 Other abnormalities of gait and mobility: Secondary | ICD-10-CM | POA: Diagnosis not present

## 2024-07-14 NOTE — Therapy (Signed)
 DAILY NOTE      Patient Name: Gwendolyn [name] MRN: [id number] DOB:Dec 19, 1940, 83 y.o., female Today's Date: 07/14/2024  END OF SESSION:  PT End of Session - 07/14/24 1021     Visit Number 14    Number of Visits 16    Date for Recertification  08/24/24    Authorization Type HTA    PT Start Time 1017    PT Stop Time 1058    PT Time Calculation (min) 41 min    Activity Tolerance Patient tolerated treatment well    Behavior During Therapy [facility] for tasks assessed/performed              Past Medical History:  Diagnosis Date   Allergy    Arthritis    Blood transfusion without reported diagnosis    Cataract    removed both eyes    Cervical radicular pain 02/11/2016   Glaucoma    low pressure glaucoma    Hyperlipidemia    Hypertension    Osteopenia    Parkinson's disease (HCC)    Pelvis fracture (HCC) 03/26/2002   both sides fx   Positive colorectal cancer screening using Cologuard test 04/14/2017   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    Vitamin D  deficiency    Past Surgical History:  Procedure Laterality Date   COLONOSCOPY     DILATION AND CURETTAGE OF UTERUS     age 39    right elbow   surgery  March 26, 2002   right hand ulner nerve surgery  March 26, 2002   I and d done right hand/wrist   right shoulder humerus fx  july 2003   surgery done   right total hip arthroplasty  2008   right total knee arthroplasty  sept 2012   right wrist plating  2004   TONSILLECTOMY  age 53   TOTAL HIP ARTHROPLASTY  10/22/2011   Procedure: TOTAL HIP ARTHROPLASTY ANTERIOR APPROACH;  Surgeon: Donnice JONETTA Car, MD;  Location: [facility];  Service: Orthopedics;  Laterality: Left;  Left Total Hip Arthroplasty,  Anterior Approach    TOTAL KNEE ARTHROPLASTY Left 04/28/2022   Procedure: TOTAL KNEE ARTHROPLASTY;  Surgeon: Car Donnice, MD;  Location: [facility];  Service: Orthopedics;  Laterality: Left;   Patient Active Problem List   Diagnosis Date Noted   S/P total knee arthroplasty,  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   Parkinson's disease (HCC) 11/28/2021   B12 deficiency 05/28/2021   Cervical arthritis 09/27/2017   Open-angle glaucoma 05/14/2016   Generalized anxiety disorder 02/11/2016   Overweight (BMI 25.0-29.9) 04/25/2015   Medication management 02/12/2014   Essential hypertension 08/08/2013   Hyperlipidemia, mixed 08/08/2013   Abnormal glucose 08/08/2013   Vitamin D  deficiency 08/08/2013    PCP: Theophilus Andrews, Tully GRADE, MD   REFERRING PROVIDER: Patel, Donika K, DO  REFERRING DIAG: [phone] (ICD-10-CM) - Chronic bilateral low back pain without sciatica  Rationale for Evaluation and Treatment: Rehabilitation  THERAPY DIAG:  Other low back pain  Muscle weakness (generalized)  Other abnormalities of gait and mobility  ONSET DATE: chronic  SUBJECTIVE:  SUBJECTIVE STATEMENT: Pt reports no changes since her last session. She continues to work on her HEP.    EVAL: In 2022, she recalls noticing that she began walking with a limp.  She has low back pain sometimes at the end of the day.  She starting using a cane around the same time.  She had left knee replacement in 2023 and continued to walk with a limp.  She is very active and continues to go to balance class and water exercise.  She noticed that if she sits too long, she has pressure in the low back.  She has weakness in the right leg and has some difficulty with stairs and has to manually lift her right leg to get it into her car.  She denies weakness in the right leg or arms.  No problems with speech/swallow.  No numbness/tingling of the arms or legs.     She is retired engineer, civil (consulting) from the surgical center.  She worked full-time until she was 72. She was very independent and kept up with her yard work until 2 years ago when her right leg  started to get weak.  Fortunately, she has no falls except a mechanical one when she lost balance while trying to push a stake into the ground.     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 MRI lumbar spine showed left foraminal stenosis at L2-3 and L3-4.  Per my review, there is atrophy of the paraspinal muscles.  EMG shows neurogenic changes involving rectus femoris, adductor longus, iliopsoas, and gluteus medius muscles.  No evidence of myopathy or active denervation.     UPDATE 04/10/2024:  She has low back pain and achy pain on top of her feet which is worse with prolonged activity.  She gets relief with tramadol. She uses a cane for support. 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  There has been no change in her leg weakness.  She continues to feel that at times, her foot will turn inwards.  Fortunately, she has not had any falls.   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  Myopathy is less likely given normal CK and no findings of myopathy on EMG.  EMG showed diffuse neurogenic changes involving the hip muscles without active denervation. MRI of the hip shows severe muscle atrophy of the iliopsoas, gluteus medius, and gluteus minimus along with paraspinal muscle atrophy.  There is no evidence of hyperreflexia or increased tone to suggest UMN pathology. Previously discussed additional testing such as CSF testing or muscle biopsy, and opted to monitor due to stability of symptoms.  PAIN:  Are you having pain? Yes: NPRS scale: 0/10 Pain location: low back Pain description: ache Aggravating factors: position changes Relieving factors: sitting   PRECAUTIONS: None  RED FLAGS: None   WEIGHT BEARING RESTRICTIONS: No  FALLS:  Has patient fallen in last 6 months? No  OCCUPATION: retired  PLOF: Independent  PATIENT GOALS: To  manage my low back pain  NEXT MD VISIT: TBD  OBJECTIVE:  Note: Objective measures were completed at Evaluation unless otherwise noted.  DIAGNOSTIC FINDINGS:  IMPRESSION: 1. 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 2. 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 3. Left lateral recess narrowing at L1-2 and L2-3, which could cause left L2 and L3 radiculopathy.  Electronically Signed   By: Franky Stanford M.D.   On: 03/17/2023 01:53  PATIENT SURVEYS:  Modified Oswestry:   Interpretation of scores: Score Category Description  0-20% Minimal Disability The patient can cope with most living activities. Usually no treatment is indicated apart from advice on lifting, sitting and exercise  21-40% Moderate Disability The patient experiences more pain and difficulty with sitting, lifting and standing. Travel and social life are more difficult and they may be disabled from work. Personal care, sexual activity and sleeping are not grossly affected, and the patient can usually be managed by conservative means  41-60% Severe Disability Pain remains the main problem in this group, but activities of daily living are affected. These patients require a detailed investigation  61-80% Crippled Back pain impinges on all aspects of the patient's life. Positive intervention is required  81-100% Bed-bound  These patients are either bed-bound or exaggerating their symptoms  Bluford FORBES Zoe DELENA Karon DELENA, et al. Surgery versus conservative management of stable thoracolumbar fracture: the PRESTO feasibility RCT. [place] ([place]): [employer]; 2021 Nov. [facility] Technology Assessment, No. 25.62.) Appendix 3, Oswestry Disability Index category descriptors. Available from: Findjewelers.cz  Minimally Clinically Important Difference (MCID) = 12.8%   MUSCLE LENGTH: Hamstrings: Right 70 deg; Left 70 deg   POSTURE:  decreased lumbar lordosis and flexed hip posture, knee valgus   PALPATION: deferred  LUMBAR ROM:   AROM eval  Flexion 90%  Extension 10%  Right lateral flexion 50%  Left lateral flexion 50%  Right rotation   Left rotation    (Blank rows = not tested)  LOWER EXTREMITY ROM:   WFL throughout  Active  Right eval Left eval  Hip flexion    Hip extension    Hip abduction    Hip adduction    Hip internal rotation    Hip external rotation    Knee flexion    Knee extension    Ankle dorsiflexion    Ankle plantarflexion    Ankle inversion    Ankle eversion     (Blank rows = not tested)  LOWER EXTREMITY MMT:  see 30s chair stand test  MMT Right eval Left eval 06/12/24 L 10/30 R/L  Hip flexion   4- 4-/4  Hip extension   4- /  Hip abduction   3 2+/3-  Hip adduction    4-/4  Hip internal rotation    4/5  Hip external rotation    3/4  Knee flexion    4/4  Knee extension    4/4  Ankle dorsiflexion      Ankle plantarflexion      Ankle inversion      Ankle eversion       (Blank rows = not tested)  LUMBAR SPECIAL TESTS:  Straight leg raise test: Negative and Slump test: Negative  FUNCTIONAL TESTS:  30 seconds chair stand test 5 reps  GAIT: Distance walked: 26ft x2 Assistive device utilized: Single point cane Level of assistance: Complete Independence Comments: slow cadence  TREATMENT:   [facility] Adult PT Treatment:                                                DATE: 07/14/24 Therapeutic Exercise: Side clam RTB x 10 on L, unable on R  Supine clam RTB x 20  Bridge RTB at knees x 20  Therapeutic Activity: Side stepping at counter x 3 mins Side stepping over hurdle at counter x 3 mins  Marching with yellow band at feet x 3 mins STS with yellow band at knees and foam on chair 2x10 STS with yellow band at knees, foam on chair, and 5lb DB x 12   [facility] Adult PT Treatment:                                                DATE: 06/29/24   Therapeutic Exercise: Side  clam  Therapeutic Activity: Standing march  Standing hip abd Discussion on role of lateral hip strength in gait Checking goals and reviewing with Pt                                                                                                                     PATIENT EDUCATION:  Education details: Discussed eval findings, rehab rationale and POC and patient is in agreement  Person educated: Patient Education method: Explanation and Handouts Education comprehension: verbalized understanding and needs further education  HOME EXERCISE PROGRAM: Access Code: [email or web address] URL: [email or web address] Date: 06/14/2024 Prepared by: Helene Gasmen  Exercises - Supine Bridge with Resistance Band  - 1-2 x daily - 5 x weekly - 3 sets - 10 reps - Hooklying Single Leg Bent Knee Fallouts with Resistance  - 1-2 x daily - 5 x weekly - 3 sets - 10 reps - Static Prone on Elbows  - 1-2 x daily - 5 x weekly - 1 sets - 2 min hold - Sit to Stand Without Arm Support  - 1 x daily - 4-7 x weekly - 3 sets - 5-10 reps - Standing March with Counter Support  - 1 x daily - 7 x weekly - 2 sets - 10 reps - Forward Step Touch  - 1 x daily - 7 x weekly - 3 sets - 10 reps - Sideways Step Touch  - 1 x daily - 7 x weekly - 3 sets - 10 reps  ASSESSMENT:  CLINICAL IMPRESSION:  Pt tolerated today's session well without exacerbation of symptoms. Pt continues to have lateral hip weakness as seen with poor ROM during clams and side stepping. She was challenged today with controlling knee valgus during marching and STS. Pt will continue to benefit from skilled physical therapy to improve hip strength and functional ability.   Upon goal check pt has made good progress toward all short and long term goals.  She has doubled her 45'' STS time, improved her 2 MWT, and is better able to manage her LBP.  Remaining deficits include R hip weakness and resulting gait deviations that are likely contributing to LBP and  increased fall risk.  Isolated weakness of R hip abd and ER in closed and open chain should be addressed to improve safety, comfort, and functional independence.    EVAL:  Patient is a 83 y.o. female who was seen today for physical therapy evaluation and treatment for chronic low back pain and limited mobility due to underlying degenerative changes including stenosis.  Imaging studies show atrophy of lumbopelvic musculature.  BLE ROM and flexibility is functional and no neural tension signs noted.  Patient is a good candidate for OPPT to include aquatic PT, core strengthening and lumbar spine flexibility tasks.  OBJECTIVE IMPAIRMENTS: Abnormal gait, decreased activity tolerance, decreased coordination, decreased mobility, difficulty walking, decreased ROM, decreased strength, impaired perceived functional ability, improper body mechanics, postural dysfunction, and pain.   ACTIVITY LIMITATIONS: carrying, lifting, bending, standing, sleeping, transfers, and bed mobility  PERSONAL FACTORS: Age, Fitness, Past/current experiences, and Time since onset of injury/illness/exacerbation are also affecting patient's functional outcome.   REHAB POTENTIAL: Good  CLINICAL DECISION MAKING: Evolving/moderate complexity  EVALUATION COMPLEXITY: Moderate   GOALS: Goals reviewed with patient? No  SHORT TERM GOALS: Target date: 05/24/2024  Patient to demonstrate independence in HEP  Baseline: F9MG [facility] Goal status: MET  2.  Assess 2 MWT  Baseline: TBD; 05/18/24 53/56 Goal status: MET  3.  Assess BERG for balance deficits Baseline: TBD; 05/18/24 53/56 Goal status: MET  LONG TERM GOALS: Target date: 06/21/2024 extended 08/24/2024   Patient will acknowledge 4/10 pain at least once during episode of care   Baseline: 5/10 10/30: can reach 4/10 Goal status: MET  2.  Patient will score at least 10/50 on ODI to signify clinically meaningful improvement in functional abilities.   Baseline: 16/50 10/30:  4/50 Goal status: MET  3.  Patient will increase 30s chair stand reps from 5 to 8 without arms to demonstrate and improved functional ability with less pain/difficulty as well as reduce fall risk.  Baseline: 5 10/30: 10 Goal status: MET  4.  Assess BERG goal as needed Baseline: TBD; no need Goal status: MET  5.  Assess 2 MWT for progress Baseline: 238ft  10/30:  308 Goal status: ODI  6.  Deepika will demonstrate significant improvement in R compensated trendelenburg gait pattern  Evaluation/Baseline: significant deviation Goal status: NEW  7.  Manahil will be able to stand for >20'' in R SLS stance, to show a significant improvement in balance in order to reduce fall risk   Evaluation/Baseline: 4'' R, 15''+ L Goal status: NEW  8.  Shaketha will improve the following MMTs to >/= 4/5 to show improvement in strength:  R hip ER and abd   Evaluation/Baseline: see chart in note Goal status: NEW   PLAN:  PT FREQUENCY: 1-2x/week  PT DURATION: 8 weeks  PLANNED INTERVENTIONS: 97110-Therapeutic exercises, 97530- Therapeutic activity, 97112- Neuromuscular re-education, 97535- Self Care, 02859- Manual therapy, [id number]- Gait training, [phone]- Aquatic Therapy, [phone] (1-2 muscles), 20561 (3+ muscles)- Dry Needling, Patient/Family education, Balance training, and Stair training.  PLAN FOR NEXT SESSION: HEP review and update, manual techniques as appropriate, aerobic tasks, ROM and flexibility activities, strengthening and PREs, TPDN, gait and balance training as needed     Marijo DELENA Berber, PT 07/14/2024, 11:13 AM

## 2024-07-18 NOTE — Therapy (Unsigned)
 DAILY NOTE      Patient Name: JARYIAH MEHLMAN MRN: [id number] DOB:Dec 21, 1940, 83 y.o., female Today's Date: 07/19/2024  END OF SESSION:  PT End of Session - 07/19/24 1537     Visit Number 15    Number of Visits 16    Date for Recertification  08/24/24    Authorization Type HTA    PT Start Time 1535    PT Stop Time 1615    PT Time Calculation (min) 40 min    Activity Tolerance Patient tolerated treatment well    Behavior During Therapy [facility] for tasks assessed/performed               Past Medical History:  Diagnosis Date   Allergy    Arthritis    Blood transfusion without reported diagnosis    Cataract    removed both eyes    Cervical radicular pain 02/11/2016   Glaucoma    low pressure glaucoma    Hyperlipidemia    Hypertension    Osteopenia    Parkinson's disease (HCC)    Pelvis fracture (HCC) 03/26/2002   both sides fx   Positive colorectal cancer screening using Cologuard test 04/14/2017   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    Vitamin D  deficiency    Past Surgical History:  Procedure Laterality Date   COLONOSCOPY     DILATION AND CURETTAGE OF UTERUS     age 74    right elbow   surgery  March 26, 2002   right hand ulner nerve surgery  March 26, 2002   I and d done right hand/wrist   right shoulder humerus fx  july 2003   surgery done   right total hip arthroplasty  2008   right total knee arthroplasty  sept 2012   right wrist plating  2004   TONSILLECTOMY  age 77   TOTAL HIP ARTHROPLASTY  10/22/2011   Procedure: TOTAL HIP ARTHROPLASTY ANTERIOR APPROACH;  Surgeon: Donnice JONETTA Car, MD;  Location: [facility];  Service: Orthopedics;  Laterality: Left;  Left Total Hip Arthroplasty,  Anterior Approach    TOTAL KNEE ARTHROPLASTY Left 04/28/2022   Procedure: TOTAL KNEE ARTHROPLASTY;  Surgeon: Car Donnice, MD;  Location: [facility];  Service: Orthopedics;  Laterality: Left;   Patient Active Problem List   Diagnosis Date Noted   S/P total knee  arthroplasty, 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   Parkinson's disease (HCC) 11/28/2021   B12 deficiency 05/28/2021   Cervical arthritis 09/27/2017   Open-angle glaucoma 05/14/2016   Generalized anxiety disorder 02/11/2016   Overweight (BMI 25.0-29.9) 04/25/2015   Medication management 02/12/2014   Essential hypertension 08/08/2013   Hyperlipidemia, mixed 08/08/2013   Abnormal glucose 08/08/2013   Vitamin D  deficiency 08/08/2013    PCP: Theophilus Andrews, Tully GRADE, MD   REFERRING PROVIDER: Patel, Donika K, DO  REFERRING DIAG: [id number] (ICD-10-CM) - Chronic bilateral low back pain without sciatica  Rationale for Evaluation and Treatment: Rehabilitation  THERAPY DIAG:  Other low back pain  Muscle weakness (generalized)  Other abnormalities of gait and mobility  ONSET DATE: chronic  SUBJECTIVE:  SUBJECTIVE STATEMENT:  Frustrated at lack of progress regarding strength gains and decreased functional mobility.  Concerned about inability to safely turn due to L foot not clearing ground adequately.   EVAL: In 2022, she recalls noticing that she began walking with a limp.  She has low back pain sometimes at the end of the day.  She starting using a cane around the same time.  She had left knee replacement in 2023 and continued to walk with a limp.  She is very active and continues to go to balance class and water exercise.  She noticed that if she sits too long, she has pressure in the low back.  She has weakness in the right leg and has some difficulty with stairs and has to manually lift her right leg to get it into her car.  She denies weakness in the right leg or arms.  No problems with speech/swallow.  No numbness/tingling of the arms or legs.     She is retired engineer, civil (consulting) from the surgical center.  She worked full-time  until she was 89. She was very independent and kept up with her yard work until 2 years ago when her right leg started to get weak.  Fortunately, she has no falls except a mechanical one when she lost balance while trying to push a stake into the ground.     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 MRI lumbar spine showed left foraminal stenosis at L2-3 and L3-4.  Per my review, there is atrophy of the paraspinal muscles.  EMG shows neurogenic changes involving rectus femoris, adductor longus, iliopsoas, and gluteus medius muscles.  No evidence of myopathy or active denervation.     UPDATE 04/10/2024:  She has low back pain and achy pain on top of her feet which is worse with prolonged activity.  She gets relief with tramadol. She uses a cane for support. 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  There has been no change in her leg weakness.  She continues to feel that at times, her foot will turn inwards.  Fortunately, she has not had any falls.   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  Myopathy is less likely given normal CK and no findings of myopathy on EMG.  EMG showed diffuse neurogenic changes involving the hip muscles without active denervation. MRI of the hip shows severe muscle atrophy of the iliopsoas, gluteus medius, and gluteus minimus along with paraspinal muscle atrophy.  There is no evidence of hyperreflexia or increased tone to suggest UMN pathology. Previously discussed additional testing such as CSF testing or muscle biopsy, and opted to monitor due to stability of symptoms.  PAIN:  Are you having pain? Yes: NPRS scale: 0/10 Pain location: low back Pain description: ache Aggravating factors: position changes Relieving factors: sitting   PRECAUTIONS: None  RED FLAGS: None   WEIGHT BEARING RESTRICTIONS: No  FALLS:   Has patient fallen in last 6 months? No  OCCUPATION: retired  PLOF: Independent  PATIENT GOALS: To manage my low back pain  NEXT MD VISIT: TBD  OBJECTIVE:  Note: Objective measures were completed at Evaluation unless otherwise noted.  DIAGNOSTIC FINDINGS:  IMPRESSION: 1. 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 2. 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 3. Left lateral recess narrowing at L1-2 and  L2-3, which could cause left L2 and L3 radiculopathy.     Electronically Signed   By: Franky Stanford M.D.   On: 03/17/2023 01:53  PATIENT SURVEYS:  Modified Oswestry:   Interpretation of scores: Score Category Description  0-20% Minimal Disability The patient can cope with most living activities. Usually no treatment is indicated apart from advice on lifting, sitting and exercise  21-40% Moderate Disability The patient experiences more pain and difficulty with sitting, lifting and standing. Travel and social life are more difficult and they may be disabled from work. Personal care, sexual activity and sleeping are not grossly affected, and the patient can usually be managed by conservative means  41-60% Severe Disability Pain remains the main problem in this group, but activities of daily living are affected. These patients require a detailed investigation  61-80% Crippled Back pain impinges on all aspects of the patient's life. Positive intervention is required  81-100% Bed-bound  These patients are either bed-bound or exaggerating their symptoms  Bluford FORBES Zoe DELENA Karon DELENA, et al. Surgery versus conservative management of stable thoracolumbar fracture: the PRESTO feasibility RCT. [place] ([place]): [employer]; 2021 Nov. [employer] Technology Assessment, No. 25.62.) Appendix 3, Oswestry Disability Index category descriptors. Available from: Findjewelers.cz  Minimally Clinically  Important Difference (MCID) = 12.8%   MUSCLE LENGTH: Hamstrings: Right 70 deg; Left 70 deg   POSTURE: decreased lumbar lordosis and flexed hip posture, knee valgus   PALPATION: deferred  LUMBAR ROM:   AROM eval  Flexion 90%  Extension 10%  Right lateral flexion 50%  Left lateral flexion 50%  Right rotation   Left rotation    (Blank rows = not tested)  LOWER EXTREMITY ROM:   WFL throughout  Active  Right eval Left eval  Hip flexion    Hip extension    Hip abduction    Hip adduction    Hip internal rotation    Hip external rotation    Knee flexion    Knee extension    Ankle dorsiflexion    Ankle plantarflexion    Ankle inversion    Ankle eversion     (Blank rows = not tested)  LOWER EXTREMITY MMT:  see 30s chair stand test  MMT Right eval Left eval 06/12/24 L 10/30 R/L  Hip flexion   4- 4-/4  Hip extension   4- /  Hip abduction   3 2+/3-  Hip adduction    4-/4  Hip internal rotation    4/5  Hip external rotation    3/4  Knee flexion    4/4  Knee extension    4/4  Ankle dorsiflexion      Ankle plantarflexion      Ankle inversion      Ankle eversion       (Blank rows = not tested)  LUMBAR SPECIAL TESTS:  Straight leg raise test: Negative and Slump test: Negative  FUNCTIONAL TESTS:  30 seconds chair stand test 5 reps  GAIT: Distance walked: 59ft x2 Assistive device utilized: Single point cane Level of assistance: Complete Independence Comments: slow cadence  TREATMENT:  [facility] Adult PT Treatment:                                                DATE: 07/19/24 Therapeutic Exercise: HEP update adding gastroc stretches and DF strengthening.  Gait training emphasizing  overcorrection of valgus collapse by ambulating in pigeon toe configuration.   Self Care: Instructed in various gait strategies to minimize valgus collapse due to primarily to hip weakness resulting in internal rotation of hips creating a valgus collapse and a pronation moment at  ankles which limited patient's ability to dorsiflex and clear feet during turning activities.  This patient was able to partially correct postural faults with overcompensation when instructed to walk with the pigeon toe pattern which promoted bilateral supination and minimized valgus collapse.  Patient did report a decrease in midfoot pain at end of session which can be attributed to restoration of foot mechanics.  Home exercise program updated to add gastroc stretching as well as dorsiflexion strengthening against the wall which allowed patient to adopt full knee extension and minimize postural deformities [facility] Adult PT Treatment:                                                DATE: 07/14/24 Therapeutic Exercise: Side clam RTB x 10 on L, unable on R  Supine clam RTB x 20  Bridge RTB at knees x 20   Therapeutic Activity: Side stepping at counter x 3 mins Side stepping over hurdle at counter x 3 mins  Marching with yellow band at feet x 3 mins STS with yellow band at knees and foam on chair 2x10 STS with yellow band at knees, foam on chair, and 5lb DB x 12   [facility] Adult PT Treatment:                                                DATE: 06/29/24   Therapeutic Exercise: Side clam  Therapeutic Activity: Standing march  Standing hip abd Discussion on role of lateral hip strength in gait Checking goals and reviewing with Pt                                                                                                                     PATIENT EDUCATION:  Education details: Discussed eval findings, rehab rationale and POC and patient is in agreement  Person educated: Patient Education method: Explanation and Handouts Education comprehension: verbalized understanding and needs further education  HOME EXERCISE PROGRAM: Access Code: [email or web address] URL: [email or web address] Date: 06/14/2024 Prepared by: Helene Gasmen  Exercises - Supine Bridge with Resistance Band  - 1-2 x  daily - 5 x weekly - 3 sets - 10 reps - Hooklying Single Leg Bent Knee Fallouts with Resistance  - 1-2 x daily - 5 x weekly - 3 sets - 10 reps - Static Prone on Elbows  - 1-2 x daily - 5 x weekly - 1 sets - 2 min hold - Sit to Stand Without Arm Support  - 1  x daily - 4-7 x weekly - 3 sets - 5-10 reps - Standing March with Counter Support  - 1 x daily - 7 x weekly - 2 sets - 10 reps - Forward Step Touch  - 1 x daily - 7 x weekly - 3 sets - 10 reps - Sideways Step Touch  - 1 x daily - 7 x weekly - 3 sets - 10 reps  ASSESSMENT:  CLINICAL IMPRESSION: Session focus was attempting correct BLE postural faults resulting in valgus collapse, IR of hips and pronation of feet limiting her ability to DF as well as distribute weight onto heel and unload mid and forefoot regions  Pt tolerated today's session well without exacerbation of symptoms. Pt continues to have lateral hip weakness as seen with poor ROM during clams and side stepping. She was challenged today with controlling knee valgus during marching and STS. Pt will continue to benefit from skilled physical therapy to improve hip strength and functional ability.   Upon goal check pt has made good progress toward all short and long term goals.  She has doubled her 75'' STS time, improved her 2 MWT, and is better able to manage her LBP.  Remaining deficits include R hip weakness and resulting gait deviations that are likely contributing to LBP and increased fall risk.  Isolated weakness of R hip abd and ER in closed and open chain should be addressed to improve safety, comfort, and functional independence.    EVAL: Patient is a 83 y.o. female who was seen today for physical therapy evaluation and treatment for chronic low back pain and limited mobility due to underlying degenerative changes including stenosis.  Imaging studies show atrophy of lumbopelvic musculature.  BLE ROM and flexibility is functional and no neural tension signs noted.  Patient is a  good candidate for OPPT to include aquatic PT, core strengthening and lumbar spine flexibility tasks.  OBJECTIVE IMPAIRMENTS: Abnormal gait, decreased activity tolerance, decreased coordination, decreased mobility, difficulty walking, decreased ROM, decreased strength, impaired perceived functional ability, improper body mechanics, postural dysfunction, and pain.   ACTIVITY LIMITATIONS: carrying, lifting, bending, standing, sleeping, transfers, and bed mobility  PERSONAL FACTORS: Age, Fitness, Past/current experiences, and Time since onset of injury/illness/exacerbation are also affecting patient's functional outcome.   REHAB POTENTIAL: Good  CLINICAL DECISION MAKING: Evolving/moderate complexity  EVALUATION COMPLEXITY: Moderate   GOALS: Goals reviewed with patient? No  SHORT TERM GOALS: Target date: 05/24/2024  Patient to demonstrate independence in HEP  Baseline: F9MG [facility] Goal status: MET  2.  Assess 2 MWT  Baseline: TBD; 05/18/24 53/56 Goal status: MET  3.  Assess BERG for balance deficits Baseline: TBD; 05/18/24 53/56 Goal status: MET  LONG TERM GOALS: Target date: 06/21/2024 extended 08/24/2024   Patient will acknowledge 4/10 pain at least once during episode of care   Baseline: 5/10 10/30: can reach 4/10 Goal status: MET  2.  Patient will score at least 10/50 on ODI to signify clinically meaningful improvement in functional abilities.   Baseline: 16/50 10/30: 4/50 Goal status: MET  3.  Patient will increase 30s chair stand reps from 5 to 8 without arms to demonstrate and improved functional ability with less pain/difficulty as well as reduce fall risk.  Baseline: 5 10/30: 10 Goal status: MET  4.  Assess BERG goal as needed Baseline: TBD; no need Goal status: MET  5.  Assess 2 MWT for progress Baseline: 249ft  10/30:  308 Goal status: ODI  6.  Tristan will demonstrate significant improvement  in R compensated trendelenburg gait pattern  Evaluation/Baseline:  significant deviation Goal status: NEW  7.  Tinzlee will be able to stand for >20'' in R SLS stance, to show a significant improvement in balance in order to reduce fall risk   Evaluation/Baseline: 4'' R, 15''+ L Goal status: NEW  8.  Mishawn will improve the following MMTs to >/= 4/5 to show improvement in strength:  R hip ER and abd   Evaluation/Baseline: see chart in note Goal status: NEW   PLAN:  PT FREQUENCY: 1-2x/week  PT DURATION: 8 weeks  PLANNED INTERVENTIONS: 97110-Therapeutic exercises, 97530- Therapeutic activity, 97112- Neuromuscular re-education, 97535- Self Care, [id number]- Manual therapy, [id number]- Gait training, [phone]- Aquatic Therapy, [phone] (1-2 muscles), [id number] (3+ muscles)- Dry Needling, Patient/Family education, Balance training, and Stair training.  PLAN FOR NEXT SESSION: HEP review and update, manual techniques as appropriate, aerobic tasks, ROM and flexibility activities, strengthening and PREs, TPDN, gait and balance training as needed     Reyes CHRISTELLA Kohut, PT 07/19/2024, 3:39 PM

## 2024-07-19 ENCOUNTER — Ambulatory Visit

## 2024-07-19 DIAGNOSIS — M5459 Other low back pain: Secondary | ICD-10-CM

## 2024-07-19 DIAGNOSIS — M6281 Muscle weakness (generalized): Secondary | ICD-10-CM

## 2024-07-19 DIAGNOSIS — R2689 Other abnormalities of gait and mobility: Secondary | ICD-10-CM

## 2024-07-21 ENCOUNTER — Ambulatory Visit: Admitting: Physical Therapy

## 2024-07-21 ENCOUNTER — Encounter: Payer: Self-pay | Admitting: Physical Therapy

## 2024-07-21 DIAGNOSIS — M5459 Other low back pain: Secondary | ICD-10-CM

## 2024-07-21 DIAGNOSIS — R2689 Other abnormalities of gait and mobility: Secondary | ICD-10-CM

## 2024-07-21 DIAGNOSIS — M6281 Muscle weakness (generalized): Secondary | ICD-10-CM

## 2024-07-21 NOTE — Therapy (Signed)
 DAILY NOTE      Patient Name: Gwendolyn [name] MRN: [id number] DOB:22-Mar-1941, 83 y.o., female Today's Date: 07/21/2024  END OF SESSION:  PT End of Session - 07/21/24 1105     Visit Number 16    Number of Visits 32    Date for Recertification  08/24/24    Authorization Type HTA    PT Start Time 1105    PT Stop Time 1145    PT Time Calculation (min) 40 min               Past Medical History:  Diagnosis Date   Allergy    Arthritis    Blood transfusion without reported diagnosis    Cataract    removed both eyes    Cervical radicular pain 02/11/2016   Glaucoma    low pressure glaucoma    Hyperlipidemia    Hypertension    Osteopenia    Parkinson's disease (HCC)    Pelvis fracture (HCC) 03/26/2002   both sides fx   Positive colorectal cancer screening using Cologuard test 04/14/2017   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    Vitamin D  deficiency    Past Surgical History:  Procedure Laterality Date   COLONOSCOPY     DILATION AND CURETTAGE OF UTERUS     age 7    right elbow   surgery  March 26, 2002   right hand ulner nerve surgery  March 26, 2002   I and d done right hand/wrist   right shoulder humerus fx  july 2003   surgery done   right total hip arthroplasty  2008   right total knee arthroplasty  sept 2012   right wrist plating  2004   TONSILLECTOMY  age 10   TOTAL HIP ARTHROPLASTY  10/22/2011   Procedure: TOTAL HIP ARTHROPLASTY ANTERIOR APPROACH;  Surgeon: Donnice JONETTA Car, MD;  Location: [facility];  Service: Orthopedics;  Laterality: Left;  Left Total Hip Arthroplasty,  Anterior Approach    TOTAL KNEE ARTHROPLASTY Left 04/28/2022   Procedure: TOTAL KNEE ARTHROPLASTY;  Surgeon: Car Donnice, MD;  Location: [facility];  Service: Orthopedics;  Laterality: Left;   Patient Active Problem List   Diagnosis Date Noted   S/P total knee arthroplasty, 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   Parkinson's disease (HCC) 11/28/2021   B12 deficiency 05/28/2021   Cervical arthritis  09/27/2017   Open-angle glaucoma 05/14/2016   Generalized anxiety disorder 02/11/2016   Overweight (BMI 25.0-29.9) 04/25/2015   Medication management 02/12/2014   Essential hypertension 08/08/2013   Hyperlipidemia, mixed 08/08/2013   Abnormal glucose 08/08/2013   Vitamin D  deficiency 08/08/2013    PCP: Theophilus Andrews, Tully GRADE, MD   REFERRING PROVIDER: Patel, Donika K, DO  REFERRING DIAG: [phone] (ICD-10-CM) - Chronic bilateral low back pain without sciatica  Rationale for Evaluation and Treatment: Rehabilitation  THERAPY DIAG:  Other low back pain  Muscle weakness (generalized)  Other abnormalities of gait and mobility  ONSET DATE: chronic  SUBJECTIVE:  SUBJECTIVE STATEMENT:  No change since last visit.    EVAL: In 2022, she recalls noticing that she began walking with a limp.  She has low back pain sometimes at the end of the day.  She starting using a cane around the same time.  She had left knee replacement in 2023 and continued to walk with a limp.  She is very active and continues to go to balance class and water exercise.  She noticed that if she sits too long, she has pressure in the low back.  She has weakness in the right leg and has some difficulty with stairs and has to manually lift her right leg to get it into her car.  She denies weakness in the right leg or arms.  No problems with speech/swallow.  No numbness/tingling of the arms or legs.     She is retired engineer, civil (consulting) from the surgical center.  She worked full-time until she was 23. She was very independent and kept up with her yard work until 2 years ago when her right leg started to get weak.  Fortunately, she has no falls except a mechanical one when she lost balance while trying to push a stake into the ground.     Prior  testing included MRI pelvis which shows severe atrophy of the iliopsoas and gluteus medius/minimus muscles. MRI lumbar spine showed left foraminal stenosis at L2-3 and L3-4.  Per my review, there is atrophy of the paraspinal muscles.  EMG shows neurogenic changes involving rectus femoris, adductor longus, iliopsoas, and gluteus medius muscles.  No evidence of myopathy or active denervation.     UPDATE 04/10/2024:  She has low back pain and achy pain on top of her feet which is worse with prolonged activity.  She gets relief with tramadol. She uses a cane for support. 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  There has been no change in her leg weakness.  She continues to feel that at times, her foot will turn inwards.  Fortunately, she has not had any falls.   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  Myopathy is less likely given normal CK and no findings of myopathy on EMG.  EMG showed diffuse neurogenic changes involving the hip muscles without active denervation. MRI of the hip shows severe muscle atrophy of the iliopsoas, gluteus medius, and gluteus minimus along with paraspinal muscle atrophy.  There is no evidence of hyperreflexia or increased tone to suggest UMN pathology. Previously discussed additional testing such as CSF testing or muscle biopsy, and opted to monitor due to stability of symptoms.  PAIN:  Are you having pain? Yes: NPRS scale: 0/10 Pain location: low back Pain description: ache Aggravating factors: position changes Relieving factors: sitting   PRECAUTIONS: None  RED FLAGS: None   WEIGHT BEARING RESTRICTIONS: No  FALLS:  Has patient fallen in last 6 months? No  OCCUPATION: retired  PLOF: Independent  PATIENT GOALS: To manage my low back pain  NEXT MD VISIT: TBD  OBJECTIVE:  Note: Objective measures were completed at Evaluation unless otherwise noted.  DIAGNOSTIC  FINDINGS:  IMPRESSION: 1. 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 2. 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 3. Left lateral recess narrowing at L1-2 and L2-3, which could cause left L2 and L3 radiculopathy.     Electronically Signed   By: Franky Stanford  M.D.   On: 03/17/2023 01:53  PATIENT SURVEYS:  Modified Oswestry:   Interpretation of scores: Score Category Description  0-20% Minimal Disability The patient can cope with most living activities. Usually no treatment is indicated apart from advice on lifting, sitting and exercise  21-40% Moderate Disability The patient experiences more pain and difficulty with sitting, lifting and standing. Travel and social life are more difficult and they may be disabled from work. Personal care, sexual activity and sleeping are not grossly affected, and the patient can usually be managed by conservative means  41-60% Severe Disability Pain remains the main problem in this group, but activities of daily living are affected. These patients require a detailed investigation  61-80% Crippled Back pain impinges on all aspects of the patient's life. Positive intervention is required  81-100% Bed-bound  These patients are either bed-bound or exaggerating their symptoms  Bluford FORBES Zoe DELENA Karon DELENA, et al. Surgery versus conservative management of stable thoracolumbar fracture: the PRESTO feasibility RCT. [place] ([place]): [employer]; 2021 Nov. [facility] Technology Assessment, No. 25.62.) Appendix 3, Oswestry Disability Index category descriptors. Available from: Findjewelers.cz  Minimally Clinically Important Difference (MCID) = 12.8%   MUSCLE LENGTH: Hamstrings: Right 70 deg; Left 70 deg   POSTURE: decreased lumbar lordosis and flexed hip posture, knee valgus   PALPATION: deferred  LUMBAR ROM:   AROM eval  Flexion 90%  Extension 10%  Right  lateral flexion 50%  Left lateral flexion 50%  Right rotation   Left rotation    (Blank rows = not tested)  LOWER EXTREMITY ROM:   WFL throughout  Active  Right eval Left eval  Hip flexion    Hip extension    Hip abduction    Hip adduction    Hip internal rotation    Hip external rotation    Knee flexion    Knee extension    Ankle dorsiflexion    Ankle plantarflexion    Ankle inversion    Ankle eversion     (Blank rows = not tested)  LOWER EXTREMITY MMT:  see 30s chair stand test  MMT Right eval Left eval 06/12/24 L 10/30 R/L  Hip flexion   4- 4-/4  Hip extension   4- /  Hip abduction   3 2+/3-  Hip adduction    4-/4  Hip internal rotation    4/5  Hip external rotation    3/4  Knee flexion    4/4  Knee extension    4/4  Ankle dorsiflexion      Ankle plantarflexion      Ankle inversion      Ankle eversion       (Blank rows = not tested)  LUMBAR SPECIAL TESTS:  Straight leg raise test: Negative and Slump test: Negative  FUNCTIONAL TESTS:  30 seconds chair stand test 5 reps  GAIT: Distance walked: 12ft x2 Assistive device utilized: Single point cane Level of assistance: Complete Independence Comments: slow cadence  TREATMENT:  [facility] Adult PT Treatment:                                                DATE: 07/21/24 Therapeutic Exercise: Seated BTB clam STS banded with Blue band Banded Bridge Blue band Figure 4 bridge 10 each    Therapeutic Activity: Gait with SPC Pt given photo and video feedback of her  gait and valgus right knee 2 inch step with right hip hikes to hip abduction Alternating hip abduction at counter      [facility] Adult PT Treatment:                                                DATE: 07/19/24 Therapeutic Exercise: HEP update adding gastroc stretches and DF strengthening.  Gait training emphasizing overcorrection of valgus collapse by ambulating in pigeon toe configuration.   Self Care: Instructed in various gait strategies to  minimize valgus collapse due to primarily to hip weakness resulting in internal rotation of hips creating a valgus collapse and a pronation moment at ankles which limited patient's ability to dorsiflex and clear feet during turning activities.  This patient was able to partially correct postural faults with overcompensation when instructed to walk with the pigeon toe pattern which promoted bilateral supination and minimized valgus collapse.  Patient did report a decrease in midfoot pain at end of session which can be attributed to restoration of foot mechanics.  Home exercise program updated to add gastroc stretching as well as dorsiflexion strengthening against the wall which allowed patient to adopt full knee extension and minimize postural deformities [facility] Adult PT Treatment:                                                DATE: 07/14/24 Therapeutic Exercise: Side clam RTB x 10 on L, unable on R  Supine clam RTB x 20  Bridge RTB at knees x 20   Therapeutic Activity: Side stepping at counter x 3 mins Side stepping over hurdle at counter x 3 mins  Marching with yellow band at feet x 3 mins STS with yellow band at knees and foam on chair 2x10 STS with yellow band at knees, foam on chair, and 5lb DB x 12   [facility] Adult PT Treatment:                                                DATE: 06/29/24   Therapeutic Exercise: Side clam  Therapeutic Activity: Standing march  Standing hip abd Discussion on role of lateral hip strength in gait Checking goals and reviewing with Pt                                                                                                                     PATIENT EDUCATION:  Education details: Discussed eval findings, rehab rationale and POC and patient is in agreement  Person educated: Patient Education method: Explanation and Handouts Education comprehension: verbalized understanding and needs further education  HOME  EXERCISE PROGRAM: Access Code:  [email or web address] URL: [email or web address] Date: 06/14/2024 Prepared by: Helene Gasmen  Exercises - Supine Bridge with Resistance Band  - 1-2 x daily - 5 x weekly - 3 sets - 10 reps - Hooklying Single Leg Bent Knee Fallouts with Resistance  - 1-2 x daily - 5 x weekly - 3 sets - 10 reps - Static Prone on Elbows  - 1-2 x daily - 5 x weekly - 1 sets - 2 min hold - Sit to Stand Without Arm Support  - 1 x daily - 4-7 x weekly - 3 sets - 5-10 reps - Standing March with Counter Support  - 1 x daily - 7 x weekly - 2 sets - 10 reps - Forward Step Touch  - 1 x daily - 7 x weekly - 3 sets - 10 reps - Sideways Step Touch  - 1 x daily - 7 x weekly - 3 sets - 10 reps  ASSESSMENT:  CLINICAL IMPRESSION: Session focus was attempting correct BLE postural faults resulting in valgus collapse, IR of hips and pronation of feet limiting her ability to DF as well as distribute weight onto heel and unload mid and forefoot regions  Pt tolerated today's session well without exacerbation of symptoms. Pt continues to have lateral hip weakness as seen with poor ROM during clams and side stepping. She was challenged today with controlling knee valgus during marching and STS. Pt will continue to benefit from skilled physical therapy to improve hip strength and functional ability.   Upon goal check pt has made good progress toward all short and long term goals.  She has doubled her 65'' STS time, improved her 2 MWT, and is better able to manage her LBP.  Remaining deficits include R hip weakness and resulting gait deviations that are likely contributing to LBP and increased fall risk.  Isolated weakness of R hip abd and ER in closed and open chain should be addressed to improve safety, comfort, and functional independence.    EVAL: Patient is a 83 y.o. female who was seen today for physical therapy evaluation and treatment for chronic low back pain and limited mobility due to underlying degenerative changes  including stenosis.  Imaging studies show atrophy of lumbopelvic musculature.  BLE ROM and flexibility is functional and no neural tension signs noted.  Patient is a good candidate for OPPT to include aquatic PT, core strengthening and lumbar spine flexibility tasks.  OBJECTIVE IMPAIRMENTS: Abnormal gait, decreased activity tolerance, decreased coordination, decreased mobility, difficulty walking, decreased ROM, decreased strength, impaired perceived functional ability, improper body mechanics, postural dysfunction, and pain.   ACTIVITY LIMITATIONS: carrying, lifting, bending, standing, sleeping, transfers, and bed mobility  PERSONAL FACTORS: Age, Fitness, Past/current experiences, and Time since onset of injury/illness/exacerbation are also affecting patient's functional outcome.   REHAB POTENTIAL: Good  CLINICAL DECISION MAKING: Evolving/moderate complexity  EVALUATION COMPLEXITY: Moderate   GOALS: Goals reviewed with patient? No  SHORT TERM GOALS: Target date: 05/24/2024  Patient to demonstrate independence in HEP  Baseline: [facility] Goal status: MET  2.  Assess 2 MWT  Baseline: TBD; 05/18/24 53/56 Goal status: MET  3.  Assess BERG for balance deficits Baseline: TBD; 05/18/24 53/56 Goal status: MET  LONG TERM GOALS: Target date: 06/21/2024 extended 08/24/2024   Patient will acknowledge 4/10 pain at least once during episode of care   Baseline: 5/10 10/30: can reach 4/10 Goal status: MET  2.  Patient will score at least 10/50 on ODI to signify clinically meaningful  improvement in functional abilities.   Baseline: 16/50 10/30: 4/50 Goal status: MET  3.  Patient will increase 30s chair stand reps from 5 to 8 without arms to demonstrate and improved functional ability with less pain/difficulty as well as reduce fall risk.  Baseline: 5 10/30: 10 Goal status: MET  4.  Assess BERG goal as needed Baseline: TBD; no need Goal status: MET  5.  Assess 2 MWT for  progress Baseline: 230ft  10/30:  308 Goal status: ODI  6.  Zadaya will demonstrate significant improvement in R compensated trendelenburg gait pattern  Evaluation/Baseline: significant deviation Goal status: NEW  7.  Ferrin will be able to stand for >20'' in R SLS stance, to show a significant improvement in balance in order to reduce fall risk   Evaluation/Baseline: 4'' R, 15''+ L Goal status: NEW  8.  Tressie will improve the following MMTs to >/= 4/5 to show improvement in strength:  R hip ER and abd   Evaluation/Baseline: see chart in note Goal status: NEW   PLAN:  PT FREQUENCY: 1-2x/week  PT DURATION: 8 weeks  PLANNED INTERVENTIONS: 97110-Therapeutic exercises, 97530- Therapeutic activity, 97112- Neuromuscular re-education, 97535- Self Care, [id number]- Manual therapy, [id number]- Gait training, [phone]- Aquatic Therapy, [phone] (1-2 muscles), [id number] (3+ muscles)- Dry Needling, Patient/Family education, Balance training, and Stair training.  PLAN FOR NEXT SESSION: HEP review and update, manual techniques as appropriate, aerobic tasks, ROM and flexibility activities, strengthening and PREs, TPDN, gait and balance training as needed     Harlene Persons, PTA 07/21/24 11:59 AM Phone: [phone] Fax: [phone]

## 2024-07-24 ENCOUNTER — Encounter: Payer: Self-pay | Admitting: Physical Therapy

## 2024-07-24 ENCOUNTER — Ambulatory Visit: Admitting: Physical Therapy

## 2024-07-24 DIAGNOSIS — M6281 Muscle weakness (generalized): Secondary | ICD-10-CM

## 2024-07-24 DIAGNOSIS — M5459 Other low back pain: Secondary | ICD-10-CM | POA: Diagnosis not present

## 2024-07-24 NOTE — Therapy (Signed)
 DAILY NOTE      Patient Name: Gwendolyn [name] MRN: [id number] DOB:June 08, 1941, 83 y.o., female Today's Date: 07/24/2024  END OF SESSION:  PT End of Session - 07/24/24 1320     Visit Number 17    Number of Visits 32    Date for Recertification  08/24/24    Authorization Type HTA    PT Start Time 1316    PT Stop Time 1400    PT Time Calculation (min) 44 min               Past Medical History:  Diagnosis Date   Allergy    Arthritis    Blood transfusion without reported diagnosis    Cataract    removed both eyes    Cervical radicular pain 02/11/2016   Glaucoma    low pressure glaucoma    Hyperlipidemia    Hypertension    Osteopenia    Parkinson's disease (HCC)    Pelvis fracture (HCC) 03/26/2002   both sides fx   Positive colorectal cancer screening using Cologuard test 04/14/2017   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    Vitamin D  deficiency    Past Surgical History:  Procedure Laterality Date   COLONOSCOPY     DILATION AND CURETTAGE OF UTERUS     age 46    right elbow   surgery  March 26, 2002   right hand ulner nerve surgery  March 26, 2002   I and d done right hand/wrist   right shoulder humerus fx  july 2003   surgery done   right total hip arthroplasty  2008   right total knee arthroplasty  sept 2012   right wrist plating  2004   TONSILLECTOMY  age 10   TOTAL HIP ARTHROPLASTY  10/22/2011   Procedure: TOTAL HIP ARTHROPLASTY ANTERIOR APPROACH;  Surgeon: Donnice JONETTA Car, MD;  Location: [facility];  Service: Orthopedics;  Laterality: Left;  Left Total Hip Arthroplasty,  Anterior Approach    TOTAL KNEE ARTHROPLASTY Left 04/28/2022   Procedure: TOTAL KNEE ARTHROPLASTY;  Surgeon: Car Donnice, MD;  Location: [facility];  Service: Orthopedics;  Laterality: Left;   Patient Active Problem List   Diagnosis Date Noted   S/P total knee arthroplasty, 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   Parkinson's disease (HCC) 11/28/2021   B12 deficiency 05/28/2021   Cervical arthritis  09/27/2017   Open-angle glaucoma 05/14/2016   Generalized anxiety disorder 02/11/2016   Overweight (BMI 25.0-29.9) 04/25/2015   Medication management 02/12/2014   Essential hypertension 08/08/2013   Hyperlipidemia, mixed 08/08/2013   Abnormal glucose 08/08/2013   Vitamin D  deficiency 08/08/2013    PCP: Theophilus Andrews, Tully GRADE, MD   REFERRING PROVIDER: Patel, Donika K, DO  REFERRING DIAG: [phone] (ICD-10-CM) - Chronic bilateral low back pain without sciatica  Rationale for Evaluation and Treatment: Rehabilitation  THERAPY DIAG:  Other low back pain  Muscle weakness (generalized)  ONSET DATE: chronic  SUBJECTIVE:  SUBJECTIVE STATEMENT:  No change since last visit.    EVAL: In 2022, she recalls noticing that she began walking with a limp.  She has low back pain sometimes at the end of the day.  She starting using a cane around the same time.  She had left knee replacement in 2023 and continued to walk with a limp.  She is very active and continues to go to balance class and water exercise.  She noticed that if she sits too long, she has pressure in the low back.  She has weakness in the right leg and has some difficulty with stairs and has to manually lift her right leg to get it into her car.  She denies weakness in the right leg or arms.  No problems with speech/swallow.  No numbness/tingling of the arms or legs.     She is retired engineer, civil (consulting) from the surgical center.  She worked full-time until she was 25. She was very independent and kept up with her yard work until 2 years ago when her right leg started to get weak.  Fortunately, she has no falls except a mechanical one when she lost balance while trying to push a stake into the ground.     Prior testing included MRI pelvis which shows severe  atrophy of the iliopsoas and gluteus medius/minimus muscles. MRI lumbar spine showed left foraminal stenosis at L2-3 and L3-4.  Per my review, there is atrophy of the paraspinal muscles.  EMG shows neurogenic changes involving rectus femoris, adductor longus, iliopsoas, and gluteus medius muscles.  No evidence of myopathy or active denervation.     UPDATE 04/10/2024:  She has low back pain and achy pain on top of her feet which is worse with prolonged activity.  She gets relief with tramadol. She uses a cane for support. 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  There has been no change in her leg weakness.  She continues to feel that at times, her foot will turn inwards.  Fortunately, she has not had any falls.   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  Myopathy is less likely given normal CK and no findings of myopathy on EMG.  EMG showed diffuse neurogenic changes involving the hip muscles without active denervation. MRI of the hip shows severe muscle atrophy of the iliopsoas, gluteus medius, and gluteus minimus along with paraspinal muscle atrophy.  There is no evidence of hyperreflexia or increased tone to suggest UMN pathology. Previously discussed additional testing such as CSF testing or muscle biopsy, and opted to monitor due to stability of symptoms.  PAIN:  Are you having pain? Yes: NPRS scale: 0/10 Pain location: low back Pain description: ache Aggravating factors: position changes Relieving factors: sitting   PRECAUTIONS: None  RED FLAGS: None   WEIGHT BEARING RESTRICTIONS: No  FALLS:  Has patient fallen in last 6 months? No  OCCUPATION: retired  PLOF: Independent  PATIENT GOALS: To manage my low back pain  NEXT MD VISIT: TBD  OBJECTIVE:  Note: Objective measures were completed at Evaluation unless otherwise noted.  DIAGNOSTIC FINDINGS:  IMPRESSION: 1. Mild spinal canal stenosis  at L4-L5 due to combination of disc bulge and facet arthrosis. 2. 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 3. Left lateral recess narrowing at L1-2 and L2-3, which could cause left L2 and L3 radiculopathy.     Electronically Signed   By: Franky Stanford  M.D.   On: 03/17/2023 01:53  PATIENT SURVEYS:  Modified Oswestry:   Interpretation of scores: Score Category Description  0-20% Minimal Disability The patient can cope with most living activities. Usually no treatment is indicated apart from advice on lifting, sitting and exercise  21-40% Moderate Disability The patient experiences more pain and difficulty with sitting, lifting and standing. Travel and social life are more difficult and they may be disabled from work. Personal care, sexual activity and sleeping are not grossly affected, and the patient can usually be managed by conservative means  41-60% Severe Disability Pain remains the main problem in this group, but activities of daily living are affected. These patients require a detailed investigation  61-80% Crippled Back pain impinges on all aspects of the patient's life. Positive intervention is required  81-100% Bed-bound  These patients are either bed-bound or exaggerating their symptoms  Bluford FORBES Zoe DELENA Karon DELENA, et al. Surgery versus conservative management of stable thoracolumbar fracture: the PRESTO feasibility RCT. [place] ([place]): [employer]; 2021 Nov. [facility] Technology Assessment, No. 25.62.) Appendix 3, Oswestry Disability Index category descriptors. Available from: Findjewelers.cz  Minimally Clinically Important Difference (MCID) = 12.8%   MUSCLE LENGTH: Hamstrings: Right 70 deg; Left 70 deg   POSTURE: decreased lumbar lordosis and flexed hip posture, knee valgus   PALPATION: deferred  LUMBAR ROM:   AROM eval  Flexion 90%  Extension 10%  Right lateral flexion 50%  Left lateral flexion 50%  Right  rotation   Left rotation    (Blank rows = not tested)  LOWER EXTREMITY ROM:   WFL throughout  Active  Right eval Left eval  Hip flexion    Hip extension    Hip abduction    Hip adduction    Hip internal rotation    Hip external rotation    Knee flexion    Knee extension    Ankle dorsiflexion    Ankle plantarflexion    Ankle inversion    Ankle eversion     (Blank rows = not tested)  LOWER EXTREMITY MMT:  see 30s chair stand test  MMT Right eval Left eval 06/12/24 L 10/30 R/L  Hip flexion   4- 4-/4  Hip extension   4- /  Hip abduction   3 2+/3-  Hip adduction    4-/4  Hip internal rotation    4/5  Hip external rotation    3/4  Knee flexion    4/4  Knee extension    4/4  Ankle dorsiflexion      Ankle plantarflexion      Ankle inversion      Ankle eversion       (Blank rows = not tested)  LUMBAR SPECIAL TESTS:  Straight leg raise test: Negative and Slump test: Negative  FUNCTIONAL TESTS:  30 seconds chair stand test 5 reps  GAIT: Distance walked: 28ft x2 Assistive device utilized: Single point cane Level of assistance: Complete Independence Comments: slow cadence  TREATMENT:  [facility] Adult PT Treatment:                                                DATE: 07/24/24 Therapeutic Exercise: Nustep Seated CLAM Banded STS x 10 Supine bridge  Supine Clam with blue band  Banded bridge blue  Figure 4 bridge x 10 each   Therapeutic Activity: Alt hip abduction  Side stepping  Retro stepping      [facility] Adult PT Treatment:                                                DATE: 07/21/24 Therapeutic Exercise: Seated BTB clam STS banded with Blue band Banded Bridge Blue band Figure 4 bridge 10 each  Heel raises x 10    Therapeutic Activity: Gait with SPC Pt given photo and video feedback of her gait and valgus right knee 2 inch step with right hip hikes to hip abduction Alternating hip abduction at counter      [facility] Adult PT Treatment:                                                 DATE: 07/19/24 Therapeutic Exercise: HEP update adding gastroc stretches and DF strengthening.  Gait training emphasizing overcorrection of valgus collapse by ambulating in pigeon toe configuration.   Self Care: Instructed in various gait strategies to minimize valgus collapse due to primarily to hip weakness resulting in internal rotation of hips creating a valgus collapse and a pronation moment at ankles which limited patient's ability to dorsiflex and clear feet during turning activities.  This patient was able to partially correct postural faults with overcompensation when instructed to walk with the pigeon toe pattern which promoted bilateral supination and minimized valgus collapse.  Patient did report a decrease in midfoot pain at end of session which can be attributed to restoration of foot mechanics.  Home exercise program updated to add gastroc stretching as well as dorsiflexion strengthening against the wall which allowed patient to adopt full knee extension and minimize postural deformities                                                                                                                       PATIENT EDUCATION:  Education details: Discussed eval findings, rehab rationale and POC and patient is in agreement  Person educated: Patient Education method: Explanation and Handouts Education comprehension: verbalized understanding and needs further education  HOME EXERCISE PROGRAM: Access Code: [email or web address] [facility] URL: [email or web address] Date: 06/14/2024 Prepared by: Helene Gasmen  Exercises - Supine Bridge with Resistance Band  - 1-2 x daily - 5 x weekly - 3 sets - 10 reps - Hooklying Single Leg Bent Knee Fallouts with Resistance  - 1-2 x daily - 5 x weekly - 3 sets - 10 reps - Static Prone on Elbows  - 1-2 x daily - 5 x weekly - 1 sets - 2 min hold - Sit to Stand Without Arm Support  - 1 x daily - 4-7 x weekly - 3 sets - 5-10  reps - Standing March with Counter Support  - 1  x daily - 7 x weekly - 2 sets - 10 reps - Forward Step Touch  - 1 x daily - 7 x weekly - 3 sets - 10 reps - Sideways Step Touch  - 1 x daily - 7 x weekly - 3 sets - 10 reps  ASSESSMENT:  CLINICAL IMPRESSION: Session focused on LE strength adding calf raises. Worked on retro stepping which is challenging to maintain step through pattern. Pt to make apt with ortho to discuss right knee valgus. Will continue to address gait deviations and LE weakness.   Upon goal check pt has made good progress toward all short and long term goals.  She has doubled her 41'' STS time, improved her 2 MWT, and is better able to manage her LBP.  Remaining deficits include R hip weakness and resulting gait deviations that are likely contributing to LBP and increased fall risk.  Isolated weakness of R hip abd and ER in closed and open chain should be addressed to improve safety, comfort, and functional independence.    EVAL: Patient is a 83 y.o. female who was seen today for physical therapy evaluation and treatment for chronic low back pain and limited mobility due to underlying degenerative changes including stenosis.  Imaging studies show atrophy of lumbopelvic musculature.  BLE ROM and flexibility is functional and no neural tension signs noted.  Patient is a good candidate for OPPT to include aquatic PT, core strengthening and lumbar spine flexibility tasks.  OBJECTIVE IMPAIRMENTS: Abnormal gait, decreased activity tolerance, decreased coordination, decreased mobility, difficulty walking, decreased ROM, decreased strength, impaired perceived functional ability, improper body mechanics, postural dysfunction, and pain.   ACTIVITY LIMITATIONS: carrying, lifting, bending, standing, sleeping, transfers, and bed mobility  PERSONAL FACTORS: Age, Fitness, Past/current experiences, and Time since onset of injury/illness/exacerbation are also affecting patient's functional  outcome.   REHAB POTENTIAL: Good  CLINICAL DECISION MAKING: Evolving/moderate complexity  EVALUATION COMPLEXITY: Moderate   GOALS: Goals reviewed with patient? No  SHORT TERM GOALS: Target date: 05/24/2024  Patient to demonstrate independence in HEP  Baseline: F9[facility] Goal status: MET  2.  Assess 2 MWT  Baseline: TBD; 05/18/24 53/56 Goal status: MET  3.  Assess BERG for balance deficits Baseline: TBD; 05/18/24 53/56 Goal status: MET  LONG TERM GOALS: Target date: 06/21/2024 extended 08/24/2024   Patient will acknowledge 4/10 pain at least once during episode of care   Baseline: 5/10 10/30: can reach 4/10 Goal status: MET  2.  Patient will score at least 10/50 on ODI to signify clinically meaningful improvement in functional abilities.   Baseline: 16/50 10/30: 4/50 Goal status: MET  3.  Patient will increase 30s chair stand reps from 5 to 8 without arms to demonstrate and improved functional ability with less pain/difficulty as well as reduce fall risk.  Baseline: 5 10/30: 10 Goal status: MET  4.  Assess BERG goal as needed Baseline: TBD; no need Goal status: MET  5.  Assess 2 MWT for progress Baseline: 265ft  10/30:  308 Goal status: ODI  6.  Lalana will demonstrate significant improvement in R compensated trendelenburg gait pattern  Evaluation/Baseline: significant deviation Goal status: NEW  7.  [place] will be able to stand for >20'' in R SLS stance, to show a significant improvement in balance in order to reduce fall risk   Evaluation/Baseline: 4'' R, 15''+ L Goal status: NEW  8.  Eldena will improve the following MMTs to >/= 4/5 to show improvement in strength:  R hip ER and  abd   Evaluation/Baseline: see chart in note Goal status: NEW   PLAN:  PT FREQUENCY: 1-2x/week  PT DURATION: 8 weeks  PLANNED INTERVENTIONS: 97110-Therapeutic exercises, 97530- Therapeutic activity, [id number]- Neuromuscular re-education, 97535- Self Care, [id number]- Manual  therapy, [id number]- Gait training, [phone]- Aquatic Therapy, [phone] (1-2 muscles), [id number] (3+ muscles)- Dry Needling, Patient/Family education, Balance training, and Stair training.  PLAN FOR NEXT SESSION: HEP review and update, manual techniques as appropriate, aerobic tasks, ROM and flexibility activities, strengthening and PREs, TPDN, gait and balance training as needed     Harlene Persons, PTA 07/24/24 2:06 PM Phone: [phone] Fax: [phone]

## 2024-07-31 ENCOUNTER — Encounter: Payer: Self-pay | Admitting: Physical Therapy

## 2024-07-31 ENCOUNTER — Ambulatory Visit: Attending: Neurology | Admitting: Physical Therapy

## 2024-07-31 DIAGNOSIS — M5459 Other low back pain: Secondary | ICD-10-CM | POA: Insufficient documentation

## 2024-07-31 DIAGNOSIS — M6281 Muscle weakness (generalized): Secondary | ICD-10-CM | POA: Insufficient documentation

## 2024-07-31 DIAGNOSIS — R2689 Other abnormalities of gait and mobility: Secondary | ICD-10-CM | POA: Insufficient documentation

## 2024-07-31 NOTE — Therapy (Signed)
 DAILY NOTE      Patient Name: Gwendolyn [name] MRN: [id number] DOB:Dec 06, 1940, 83 y.o., female Today's Date: 07/31/2024  END OF SESSION:  PT End of Session - 07/31/24 1333     Visit Number 18    Number of Visits 32    Date for Recertification  08/24/24    Authorization Type HTA    PT Start Time 1332    PT Stop Time 1414    PT Time Calculation (min) 42 min               Past Medical History:  Diagnosis Date   Allergy    Arthritis    Blood transfusion without reported diagnosis    Cataract    removed both eyes    Cervical radicular pain 02/11/2016   Glaucoma    low pressure glaucoma    Hyperlipidemia    Hypertension    Osteopenia    Parkinson's disease (HCC)    Pelvis fracture (HCC) 03/26/2002   both sides fx   Positive colorectal cancer screening using Cologuard test 04/14/2017   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    Vitamin D  deficiency    Past Surgical History:  Procedure Laterality Date   COLONOSCOPY     DILATION AND CURETTAGE OF UTERUS     age 21    right elbow   surgery  March 26, 2002   right hand ulner nerve surgery  March 26, 2002   I and d done right hand/wrist   right shoulder humerus fx  july 2003   surgery done   right total hip arthroplasty  2008   right total knee arthroplasty  sept 2012   right wrist plating  2004   TONSILLECTOMY  age 87   TOTAL HIP ARTHROPLASTY  10/22/2011   Procedure: TOTAL HIP ARTHROPLASTY ANTERIOR APPROACH;  Surgeon: Donnice JONETTA Car, MD;  Location: [facility];  Service: Orthopedics;  Laterality: Left;  Left Total Hip Arthroplasty,  Anterior Approach    TOTAL KNEE ARTHROPLASTY Left 04/28/2022   Procedure: TOTAL KNEE ARTHROPLASTY;  Surgeon: Car Donnice, MD;  Location: [facility];  Service: Orthopedics;  Laterality: Left;   Patient Active Problem List   Diagnosis Date Noted   S/P total knee arthroplasty, 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   Parkinson's disease (HCC) 11/28/2021   B12 deficiency 05/28/2021   Cervical arthritis  09/27/2017   Open-angle glaucoma 05/14/2016   Generalized anxiety disorder 02/11/2016   Overweight (BMI 25.0-29.9) 04/25/2015   Medication management 02/12/2014   Essential hypertension 08/08/2013   Hyperlipidemia, mixed 08/08/2013   Abnormal glucose 08/08/2013   Vitamin D  deficiency 08/08/2013    PCP: Theophilus Andrews, Tully GRADE, MD   REFERRING PROVIDER: Patel, Donika K, DO  REFERRING DIAG: [phone] (ICD-10-CM) - Chronic bilateral low back pain without sciatica  Rationale for Evaluation and Treatment: Rehabilitation  THERAPY DIAG:  Other low back pain  Muscle weakness (generalized)  Other abnormalities of gait and mobility  ONSET DATE: chronic  SUBJECTIVE:  SUBJECTIVE STATEMENT:  Pt reports that she feel like her hip is doing fairly well   EVAL: In 2022, she recalls noticing that she began walking with a limp.  She has low back pain sometimes at the end of the day.  She starting using a cane around the same time.  She had left knee replacement in 2023 and continued to walk with a limp.  She is very active and continues to go to balance class and water exercise.  She noticed that if she sits too long, she has pressure in the low back.  She has weakness in the right leg and has some difficulty with stairs and has to manually lift her right leg to get it into her car.  She denies weakness in the right leg or arms.  No problems with speech/swallow.  No numbness/tingling of the arms or legs.     She is retired engineer, civil (consulting) from the surgical center.  She worked full-time until she was 28. She was very independent and kept up with her yard work until 2 years ago when her right leg started to get weak.  Fortunately, she has no falls except a mechanical one when she lost balance while trying to push a stake  into the ground.     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 MRI lumbar spine showed left foraminal stenosis at L2-3 and L3-4.  Per my review, there is atrophy of the paraspinal muscles.  EMG shows neurogenic changes involving rectus femoris, adductor longus, iliopsoas, and gluteus medius muscles.  No evidence of myopathy or active denervation.     UPDATE 04/10/2024:  She has low back pain and achy pain on top of her feet which is worse with prolonged activity.  She gets relief with tramadol. She uses a cane for support. 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  There has been no change in her leg weakness.  She continues to feel that at times, her foot will turn inwards.  Fortunately, she has not had any falls.   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  Myopathy is less likely given normal CK and no findings of myopathy on EMG.  EMG showed diffuse neurogenic changes involving the hip muscles without active denervation. MRI of the hip shows severe muscle atrophy of the iliopsoas, gluteus medius, and gluteus minimus along with paraspinal muscle atrophy.  There is no evidence of hyperreflexia or increased tone to suggest UMN pathology. Previously discussed additional testing such as CSF testing or muscle biopsy, and opted to monitor due to stability of symptoms.  PAIN:  Are you having pain? Yes: NPRS scale: 0/10 Pain location: low back Pain description: ache Aggravating factors: position changes Relieving factors: sitting   PRECAUTIONS: None  RED FLAGS: None   WEIGHT BEARING RESTRICTIONS: No  FALLS:  Has patient fallen in last 6 months? No  OCCUPATION: retired  PLOF: Independent  PATIENT GOALS: To manage my low back pain  NEXT MD VISIT: TBD  OBJECTIVE:  Note: Objective measures were completed at Evaluation unless otherwise  noted.  DIAGNOSTIC FINDINGS:  IMPRESSION: 1. 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 2. 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 3. Left lateral recess narrowing at L1-2 and L2-3, which could cause left L2 and L3 radiculopathy.     Electronically Signed  By: Franky Stanford M.D.   On: 03/17/2023 01:53  PATIENT SURVEYS:  Modified Oswestry:   Interpretation of scores: Score Category Description  0-20% Minimal Disability The patient can cope with most living activities. Usually no treatment is indicated apart from advice on lifting, sitting and exercise  21-40% Moderate Disability The patient experiences more pain and difficulty with sitting, lifting and standing. Travel and social life are more difficult and they may be disabled from work. Personal care, sexual activity and sleeping are not grossly affected, and the patient can usually be managed by conservative means  41-60% Severe Disability Pain remains the main problem in this group, but activities of daily living are affected. These patients require a detailed investigation  61-80% Crippled Back pain impinges on all aspects of the patient's life. Positive intervention is required  81-100% Bed-bound  These patients are either bed-bound or exaggerating their symptoms  Bluford FORBES Zoe DELENA Karon DELENA, et al. Surgery versus conservative management of stable thoracolumbar fracture: the PRESTO feasibility RCT. [place] ([place]): [employer]; 2021 Nov. [facility] Technology Assessment, No. 25.62.) Appendix 3, Oswestry Disability Index category descriptors. Available from: Findjewelers.cz  Minimally Clinically Important Difference (MCID) = 12.8%   MUSCLE LENGTH: Hamstrings: Right 70 deg; Left 70 deg   POSTURE: decreased lumbar lordosis and flexed hip posture, knee valgus   PALPATION: deferred  LUMBAR ROM:   AROM eval  Flexion 90%   Extension 10%  Right lateral flexion 50%  Left lateral flexion 50%  Right rotation   Left rotation    (Blank rows = not tested)  LOWER EXTREMITY ROM:   WFL throughout  Active  Right eval Left eval  Hip flexion    Hip extension    Hip abduction    Hip adduction    Hip internal rotation    Hip external rotation    Knee flexion    Knee extension    Ankle dorsiflexion    Ankle plantarflexion    Ankle inversion    Ankle eversion     (Blank rows = not tested)  LOWER EXTREMITY MMT:  see 30s chair stand test  MMT Right eval Left eval 06/12/24 L 10/30 R/L  Hip flexion   4- 4-/4  Hip extension   4- /  Hip abduction   3 2+/3-  Hip adduction    4-/4  Hip internal rotation    4/5  Hip external rotation    3/4  Knee flexion    4/4  Knee extension    4/4  Ankle dorsiflexion      Ankle plantarflexion      Ankle inversion      Ankle eversion       (Blank rows = not tested)  LUMBAR SPECIAL TESTS:  Straight leg raise test: Negative and Slump test: Negative  FUNCTIONAL TESTS:  30 seconds chair stand test 5 reps  GAIT: Distance walked: 52ft x2 Assistive device utilized: Single point cane Level of assistance: Complete Independence Comments: slow cadence  TREATMENT:  [facility] Adult PT Treatment:                                                DATE: 07/31/24 Therapeutic Exercise: Nustep Supine alternating clam - blue TB - 2x10 ea Fig 4 bridge - 3x5x3s - small range Bridge with clam - RTB - 2x10  Therapeutic Activity: Unilateral  hurdle step overs Fig 8 cone walking Staggered sit to stand - aiming for 5 reps - 3 sets     [facility] Adult PT Treatment:                                                DATE: 07/21/24 Therapeutic Exercise: Seated BTB clam STS banded with Blue band Banded Bridge Blue band Figure 4 bridge 10 each  Heel raises x 10    Therapeutic Activity: Gait with SPC Pt given photo and video feedback of her gait and valgus right knee 2 inch step with  right hip hikes to hip abduction Alternating hip abduction at counter      [facility] Adult PT Treatment:                                                DATE: 07/19/24 Therapeutic Exercise: HEP update adding gastroc stretches and DF strengthening.  Gait training emphasizing overcorrection of valgus collapse by ambulating in pigeon toe configuration.   Self Care: Instructed in various gait strategies to minimize valgus collapse due to primarily to hip weakness resulting in internal rotation of hips creating a valgus collapse and a pronation moment at ankles which limited patient's ability to dorsiflex and clear feet during turning activities.  This patient was able to partially correct postural faults with overcompensation when instructed to walk with the pigeon toe pattern which promoted bilateral supination and minimized valgus collapse.  Patient did report a decrease in midfoot pain at end of session which can be attributed to restoration of foot mechanics.  Home exercise program updated to add gastroc stretching as well as dorsiflexion strengthening against the wall which allowed patient to adopt full knee extension and minimize postural deformities                                                                                                                       PATIENT EDUCATION:  Education details: Discussed eval findings, rehab rationale and POC and patient is in agreement  Person educated: Patient Education method: Explanation and Handouts Education comprehension: verbalized understanding and needs further education  HOME EXERCISE PROGRAM: Access Code: [email or web address] URL: [email or web address] Date: 06/14/2024 Prepared by: Helene Gasmen  Exercises - Supine Bridge with Resistance Band  - 1-2 x daily - 5 x weekly - 3 sets - 10 reps - Hooklying Single Leg Bent Knee Fallouts with Resistance  - 1-2 x daily - 5 x weekly - 3 sets - 10 reps - Static Prone on Elbows  - 1-2 x  daily - 5 x weekly - 1 sets - 2 min hold - Sit to Stand Without Arm Support  - 1 x daily - 4-7 x weekly - 3  sets - 5-10 reps - Standing March with Counter Support  - 1 x daily - 7 x weekly - 2 sets - 10 reps - Forward Step Touch  - 1 x daily - 7 x weekly - 3 sets - 10 reps - Sideways Step Touch  - 1 x daily - 7 x weekly - 3 sets - 10 reps  ASSESSMENT:  CLINICAL IMPRESSION: Focused on hip strength.  Some progression noted with bridges and clams.  Provided blue band and updated HEP.  Pt still with significant R > L lateral hip strength deficit which should be addressed for safety.   Upon goal check pt has made good progress toward all short and long term goals.  She has doubled her 12'' STS time, improved her 2 MWT, and is better able to manage her LBP.  Remaining deficits include R hip weakness and resulting gait deviations that are likely contributing to LBP and increased fall risk.  Isolated weakness of R hip abd and ER in closed and open chain should be addressed to improve safety, comfort, and functional independence.    EVAL: Patient is a 83 y.o. female who was seen today for physical therapy evaluation and treatment for chronic low back pain and limited mobility due to underlying degenerative changes including stenosis.  Imaging studies show atrophy of lumbopelvic musculature.  BLE ROM and flexibility is functional and no neural tension signs noted.  Patient is a good candidate for OPPT to include aquatic PT, core strengthening and lumbar spine flexibility tasks.  OBJECTIVE IMPAIRMENTS: Abnormal gait, decreased activity tolerance, decreased coordination, decreased mobility, difficulty walking, decreased ROM, decreased strength, impaired perceived functional ability, improper body mechanics, postural dysfunction, and pain.   ACTIVITY LIMITATIONS: carrying, lifting, bending, standing, sleeping, transfers, and bed mobility  PERSONAL FACTORS: Age, Fitness, Past/current experiences, and Time  since onset of injury/illness/exacerbation are also affecting patient's functional outcome.   REHAB POTENTIAL: Good  CLINICAL DECISION MAKING: Evolving/moderate complexity  EVALUATION COMPLEXITY: Moderate   GOALS: Goals reviewed with patient? No  SHORT TERM GOALS: Target date: 05/24/2024  Patient to demonstrate independence in HEP  Baseline: F9MG [facility] Goal status: MET  2.  Assess 2 MWT  Baseline: TBD; 05/18/24 53/56 Goal status: MET  3.  Assess BERG for balance deficits Baseline: TBD; 05/18/24 53/56 Goal status: MET  LONG TERM GOALS: Target date: 06/21/2024 extended 08/24/2024   Patient will acknowledge 4/10 pain at least once during episode of care   Baseline: 5/10 10/30: can reach 4/10 Goal status: MET  2.  Patient will score at least 10/50 on ODI to signify clinically meaningful improvement in functional abilities.   Baseline: 16/50 10/30: 4/50 Goal status: MET  3.  Patient will increase 30s chair stand reps from 5 to 8 without arms to demonstrate and improved functional ability with less pain/difficulty as well as reduce fall risk.  Baseline: 5 10/30: 10 Goal status: MET  4.  Assess BERG goal as needed Baseline: TBD; no need Goal status: MET  5.  Assess 2 MWT for progress Baseline: 241ft  10/30:  308 Goal status: ODI  6.  Bria will demonstrate significant improvement in R compensated trendelenburg gait pattern  Evaluation/Baseline: significant deviation Goal status: NEW  7.  Alyah will be able to stand for >20'' in R SLS stance, to show a significant improvement in balance in order to reduce fall risk   Evaluation/Baseline: 4'' R, 15''+ L Goal status: NEW  8.  Saxon will improve the following MMTs to >/= 4/5  to show improvement in strength:  R hip ER and abd   Evaluation/Baseline: see chart in note Goal status: NEW   PLAN:  PT FREQUENCY: 1-2x/week  PT DURATION: 8 weeks  PLANNED INTERVENTIONS: 97110-Therapeutic exercises, 97530- Therapeutic  activity, [id number]- Neuromuscular re-education, 97535- Self Care, 97140- Manual therapy, [id number]- Gait training, [phone]- Aquatic Therapy, [phone] (1-2 muscles), [id number] (3+ muscles)- Dry Needling, Patient/Family education, Balance training, and Stair training.  PLAN FOR NEXT SESSION: HEP review and update, manual techniques as appropriate, aerobic tasks, ROM and flexibility activities, strengthening and PREs, TPDN, gait and balance training as needed     Helene FORBES Gasmen PT 07/31/24 2:15 PM Phone: [phone] Fax: [phone]

## 2024-08-03 ENCOUNTER — Encounter: Payer: Self-pay | Admitting: Physical Therapy

## 2024-08-03 ENCOUNTER — Ambulatory Visit: Admitting: Physical Therapy

## 2024-08-03 DIAGNOSIS — M5459 Other low back pain: Secondary | ICD-10-CM | POA: Diagnosis not present

## 2024-08-03 DIAGNOSIS — R2689 Other abnormalities of gait and mobility: Secondary | ICD-10-CM

## 2024-08-03 DIAGNOSIS — M6281 Muscle weakness (generalized): Secondary | ICD-10-CM

## 2024-08-03 NOTE — Therapy (Signed)
 DAILY NOTE      Patient Name: Gwendolyn [name] MRN: [id number] DOB:[birth year], 83 y.o., female Today's Date: 08/03/2024  END OF SESSION:  PT End of Session - 08/03/24 1415     Visit Number 19    Number of Visits 32    Date for Recertification  08/24/24    Authorization Type HTA    PT Start Time 1415    PT Stop Time 1457    PT Time Calculation (min) 42 min               Past Medical History:  Diagnosis Date   Allergy    Arthritis    Blood transfusion without reported diagnosis    Cataract    removed both eyes    Cervical radicular pain 02/11/2016   Glaucoma    low pressure glaucoma    Hyperlipidemia    Hypertension    Osteopenia    Parkinson's disease (HCC)    Pelvis fracture (HCC) 03/26/2002   both sides fx   Positive colorectal cancer screening using Cologuard test 04/14/2017   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    Vitamin D  deficiency    Past Surgical History:  Procedure Laterality Date   COLONOSCOPY     DILATION AND CURETTAGE OF UTERUS     age 58    right elbow   surgery  March 26, 2002   right hand ulner nerve surgery  March 26, 2002   I and d done right hand/wrist   right shoulder humerus fx  july 2003   surgery done   right total hip arthroplasty  2008   right total knee arthroplasty  sept 2012   right wrist plating  2004   TONSILLECTOMY  age 37   TOTAL HIP ARTHROPLASTY  10/22/2011   Procedure: TOTAL HIP ARTHROPLASTY ANTERIOR APPROACH;  Surgeon: Donnice JONETTA Car, MD;  Location: [facility];  Service: Orthopedics;  Laterality: Left;  Left Total Hip Arthroplasty,  Anterior Approach    TOTAL KNEE ARTHROPLASTY Left 04/28/2022   Procedure: TOTAL KNEE ARTHROPLASTY;  Surgeon: Car Donnice, MD;  Location: [facility];  Service: Orthopedics;  Laterality: Left;   Patient Active Problem List   Diagnosis Date Noted   S/P total knee arthroplasty, 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   Parkinson's disease (HCC) 11/28/2021   B12 deficiency 05/28/2021   Cervical arthritis  09/27/2017   Open-angle glaucoma 05/14/2016   Generalized anxiety disorder 02/11/2016   Overweight (BMI 25.0-29.9) 04/25/2015   Medication management 02/12/2014   Essential hypertension 08/08/2013   Hyperlipidemia, mixed 08/08/2013   Abnormal glucose 08/08/2013   Vitamin D  deficiency 08/08/2013    PCP: Theophilus Andrews, Tully GRADE, MD   REFERRING PROVIDER: Patel, Donika K, DO  REFERRING DIAG: [phone] (ICD-10-CM) - Chronic bilateral low back pain without sciatica  Rationale for Evaluation and Treatment: Rehabilitation  THERAPY DIAG:  Other low back pain  Muscle weakness (generalized)  Other abnormalities of gait and mobility  ONSET DATE: chronic  SUBJECTIVE:  SUBJECTIVE STATEMENT:  Pt reports that she has been able to complete a good amount of yard work.   EVAL: In 2022, she recalls noticing that she began walking with a limp.  She has low back pain sometimes at the end of the day.  She starting using a cane around the same time.  She had left knee replacement in 2023 and continued to walk with a limp.  She is very active and continues to go to balance class and water exercise.  She noticed that if she sits too long, she has pressure in the low back.  She has weakness in the right leg and has some difficulty with stairs and has to manually lift her right leg to get it into her car.  She denies weakness in the right leg or arms.  No problems with speech/swallow.  No numbness/tingling of the arms or legs.     She is retired engineer, civil (consulting) from the surgical center.  She worked full-time until she was 79. She was very independent and kept up with her yard work until 2 years ago when her right leg started to get weak.  Fortunately, she has no falls except a mechanical one when she lost balance while trying to  push a stake into the ground.     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 MRI lumbar spine showed left foraminal stenosis at L2-3 and L3-4.  Per my review, there is atrophy of the paraspinal muscles.  EMG shows neurogenic changes involving rectus femoris, adductor longus, iliopsoas, and gluteus medius muscles.  No evidence of myopathy or active denervation.     UPDATE 04/10/2024:  She has low back pain and achy pain on top of her feet which is worse with prolonged activity.  She gets relief with tramadol. She uses a cane for support. 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  There has been no change in her leg weakness.  She continues to feel that at times, her foot will turn inwards.  Fortunately, she has not had any falls.   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  Myopathy is less likely given normal CK and no findings of myopathy on EMG.  EMG showed diffuse neurogenic changes involving the hip muscles without active denervation. MRI of the hip shows severe muscle atrophy of the iliopsoas, gluteus medius, and gluteus minimus along with paraspinal muscle atrophy.  There is no evidence of hyperreflexia or increased tone to suggest UMN pathology. Previously discussed additional testing such as CSF testing or muscle biopsy, and opted to monitor due to stability of symptoms.  PAIN:  Are you having pain? Yes: NPRS scale: 0/10 Pain location: low back Pain description: ache Aggravating factors: position changes Relieving factors: sitting   PRECAUTIONS: None  RED FLAGS: None   WEIGHT BEARING RESTRICTIONS: No  FALLS:  Has patient fallen in last 6 months? No  OCCUPATION: retired  PLOF: Independent  PATIENT GOALS: To manage my low back pain  NEXT MD VISIT: TBD  OBJECTIVE:  Note: Objective measures were completed at Evaluation  unless otherwise noted.  DIAGNOSTIC FINDINGS:  IMPRESSION: 1. 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 2. 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 3. Left lateral recess narrowing at L1-2 and L2-3, which could cause left L2 and L3 radiculopathy.  Electronically Signed   By: Franky Stanford M.D.   On: 03/17/2023 01:53  PATIENT SURVEYS:  Modified Oswestry:   Interpretation of scores: Score Category Description  0-20% Minimal Disability The patient can cope with most living activities. Usually no treatment is indicated apart from advice on lifting, sitting and exercise  21-40% Moderate Disability The patient experiences more pain and difficulty with sitting, lifting and standing. Travel and social life are more difficult and they may be disabled from work. Personal care, sexual activity and sleeping are not grossly affected, and the patient can usually be managed by conservative means  41-60% Severe Disability Pain remains the main problem in this group, but activities of daily living are affected. These patients require a detailed investigation  61-80% Crippled Back pain impinges on all aspects of the patient's life. Positive intervention is required  81-100% Bed-bound  These patients are either bed-bound or exaggerating their symptoms  Bluford FORBES Zoe DELENA Karon DELENA, et al. Surgery versus conservative management of stable thoracolumbar fracture: the PRESTO feasibility RCT. [place] ([place]): [employer]; 2021 Nov. [facility] Technology Assessment, No. 25.62.) Appendix 3, Oswestry Disability Index category descriptors. Available from: Findjewelers.cz  Minimally Clinically Important Difference (MCID) = 12.8%   MUSCLE LENGTH: Hamstrings: Right 70 deg; Left 70 deg   POSTURE: decreased lumbar lordosis and flexed hip posture, knee valgus   PALPATION: deferred  LUMBAR ROM:   AROM eval   Flexion 90%  Extension 10%  Right lateral flexion 50%  Left lateral flexion 50%  Right rotation   Left rotation    (Blank rows = not tested)  LOWER EXTREMITY ROM:   WFL throughout  Active  Right eval Left eval  Hip flexion    Hip extension    Hip abduction    Hip adduction    Hip internal rotation    Hip external rotation    Knee flexion    Knee extension    Ankle dorsiflexion    Ankle plantarflexion    Ankle inversion    Ankle eversion     (Blank rows = not tested)  LOWER EXTREMITY MMT:  see 30s chair stand test  MMT Right eval Left eval 06/12/24 L 10/30 R/L  Hip flexion   4- 4-/4  Hip extension   4- /  Hip abduction   3 2+/3-  Hip adduction    4-/4  Hip internal rotation    4/5  Hip external rotation    3/4  Knee flexion    4/4  Knee extension    4/4  Ankle dorsiflexion      Ankle plantarflexion      Ankle inversion      Ankle eversion       (Blank rows = not tested)  LUMBAR SPECIAL TESTS:  Straight leg raise test: Negative and Slump test: Negative  FUNCTIONAL TESTS:  30 seconds chair stand test 5 reps  GAIT: Distance walked: 30ft x2 Assistive device utilized: Single point cane Level of assistance: Complete Independence Comments: slow cadence  TREATMENT:  [facility] Adult PT Treatment:                                                DATE: 08/03/24 Therapeutic Exercise: Supine alternating clam - blue TB - 2x10 ea Fig 4 bridge - 3x6x3s - small range Bridge  Therapeutic Activity: Fig 8 cone walking,  hurdle walking, 6'' step up - CGA STS 15# - 3x5   [facility] Adult PT Treatment:                                                DATE: 07/21/24 Therapeutic Exercise: Seated BTB clam STS banded with Blue band Banded Bridge Blue band Figure 4 bridge 10 each  Heel raises x 10    Therapeutic Activity: Gait with SPC Pt given photo and video feedback of her gait and valgus right knee 2 inch step with right hip hikes to hip abduction Alternating hip  abduction at counter      [facility] Adult PT Treatment:                                                DATE: 07/19/24 Therapeutic Exercise: HEP update adding gastroc stretches and DF strengthening.  Gait training emphasizing overcorrection of valgus collapse by ambulating in pigeon toe configuration.   Self Care: Instructed in various gait strategies to minimize valgus collapse due to primarily to hip weakness resulting in internal rotation of hips creating a valgus collapse and a pronation moment at ankles which limited patient's ability to dorsiflex and clear feet during turning activities.  This patient was able to partially correct postural faults with overcompensation when instructed to walk with the pigeon toe pattern which promoted bilateral supination and minimized valgus collapse.  Patient did report a decrease in midfoot pain at end of session which can be attributed to restoration of foot mechanics.  Home exercise program updated to add gastroc stretching as well as dorsiflexion strengthening against the wall which allowed patient to adopt full knee extension and minimize postural deformities                                                                                                                       PATIENT EDUCATION:  Education details: Discussed eval findings, rehab rationale and POC and patient is in agreement  Person educated: Patient Education method: Explanation and Handouts Education comprehension: verbalized understanding and needs further education  HOME EXERCISE PROGRAM: Access Code: [email or web address] [facility] URL: [email or web address] Date: 06/14/2024 Prepared by: Helene Gasmen  Exercises - Supine Bridge with Resistance Band  - 1-2 x daily - 5 x weekly - 3 sets - 10 reps - Hooklying Single Leg Bent Knee Fallouts with Resistance  - 1-2 x daily - 5 x weekly - 3 sets - 10 reps - Static Prone on Elbows  - 1-2 x daily - 5 x weekly - 1 sets - 2 min hold - Sit to  Stand Without Arm Support  - 1 x daily - 4-7 x weekly - 3 sets - 5-10 reps - Standing March with Counter Support  -  1 x daily - 7 x weekly - 2 sets - 10 reps - Forward Step Touch  - 1 x daily - 7 x weekly - 3 sets - 10 reps - Sideways Step Touch  - 1 x daily - 7 x weekly - 3 sets - 10 reps  ASSESSMENT:  CLINICAL IMPRESSION:   08/03/2024:  Continued focus on hip strength combined with functional balance training.  Pt with improved tolerance and stability during SLS activities with use of can.  Still lacking CC lateral hip strength to stabilize pelvis during gait but this is slightly improved.  Progressing strength slowly but consistently.    EVAL: Patient is a 83 y.o. female who was seen today for physical therapy evaluation and treatment for chronic low back pain and limited mobility due to underlying degenerative changes including stenosis.  Imaging studies show atrophy of lumbopelvic musculature.  BLE ROM and flexibility is functional and no neural tension signs noted.  Patient is a good candidate for OPPT to include aquatic PT, core strengthening and lumbar spine flexibility tasks.  OBJECTIVE IMPAIRMENTS: Abnormal gait, decreased activity tolerance, decreased coordination, decreased mobility, difficulty walking, decreased ROM, decreased strength, impaired perceived functional ability, improper body mechanics, postural dysfunction, and pain.   ACTIVITY LIMITATIONS: carrying, lifting, bending, standing, sleeping, transfers, and bed mobility  PERSONAL FACTORS: Age, Fitness, Past/current experiences, and Time since onset of injury/illness/exacerbation are also affecting patient's functional outcome.   REHAB POTENTIAL: Good  CLINICAL DECISION MAKING: Evolving/moderate complexity  EVALUATION COMPLEXITY: Moderate   GOALS: Goals reviewed with patient? No  SHORT TERM GOALS: Target date: 05/24/2024  Patient to demonstrate independence in HEP  Baseline: F9MG [facility] Goal status: MET  2.  Assess  2 MWT  Baseline: TBD; 05/18/24 53/56 Goal status: MET  3.  Assess BERG for balance deficits Baseline: TBD; 05/18/24 53/56 Goal status: MET  LONG TERM GOALS: Target date: 06/21/2024 extended 08/24/2024   Patient will acknowledge 4/10 pain at least once during episode of care   Baseline: 5/10 10/30: can reach 4/10 Goal status: MET  2.  Patient will score at least 10/50 on ODI to signify clinically meaningful improvement in functional abilities.   Baseline: 16/50 10/30: 4/50 Goal status: MET  3.  Patient will increase 30s chair stand reps from 5 to 8 without arms to demonstrate and improved functional ability with less pain/difficulty as well as reduce fall risk.  Baseline: 5 10/30: 10 Goal status: MET  4.  Assess BERG goal as needed Baseline: TBD; no need Goal status: MET  5.  Assess 2 MWT for progress Baseline: 246ft  10/30:  308 Goal status: ODI  6.  Aarohi will demonstrate significant improvement in R compensated trendelenburg gait pattern  Evaluation/Baseline: significant deviation Goal status: NEW  7.  Mazy will be able to stand for >20'' in R SLS stance, to show a significant improvement in balance in order to reduce fall risk   Evaluation/Baseline: 4'' R, 15''+ L Goal status: NEW  8.  Joeanne will improve the following MMTs to >/= 4/5 to show improvement in strength:  R hip ER and abd   Evaluation/Baseline: see chart in note Goal status: NEW   PLAN:  PT FREQUENCY: 1-2x/week  PT DURATION: 8 weeks  PLANNED INTERVENTIONS: 97110-Therapeutic exercises, 97530- Therapeutic activity, 97112- Neuromuscular re-education, 97535- Self Care, [id number]- Manual therapy, [id number]- Gait training, [id number]- Aquatic Therapy, [phone] (1-2 muscles), 20561 (3+ muscles)- Dry Needling, Patient/Family education, Balance training, and Stair training.  PLAN FOR NEXT SESSION: HEP review and update,  manual techniques as appropriate, aerobic tasks, ROM and flexibility activities, strengthening and  PREs, TPDN, gait and balance training as needed     Helene FORBES Gasmen PT 08/03/24 3:17 PM Phone: [phone] Fax: [phone]

## 2024-08-04 NOTE — Therapy (Unsigned)
 DAILY NOTE      Patient Name: Gwendolyn [name] MRN: [id number] DOB:24-Aug-1941, 83 y.o., female Today's Date: 08/07/2024  END OF SESSION:  PT End of Session - 08/07/24 1359     Visit Number 20    Number of Visits 32    Date for Recertification  08/24/24    Authorization Type HTA    PT Start Time 1400    PT Stop Time 1440    PT Time Calculation (min) 40 min    Activity Tolerance Patient tolerated treatment well    Behavior During Therapy [name] for tasks assessed/performed         Past Medical History:  Diagnosis Date   Allergy    Arthritis    Blood transfusion without reported diagnosis    Cataract    removed both eyes    Cervical radicular pain 02/11/2016   Glaucoma    low pressure glaucoma    Hyperlipidemia    Hypertension    Osteopenia    Parkinson's disease (HCC)    Pelvis fracture (HCC) 03/26/2002   both sides fx   Positive colorectal cancer screening using Cologuard test 04/14/2017   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    Vitamin D  deficiency    Past Surgical History:  Procedure Laterality Date   COLONOSCOPY     DILATION AND CURETTAGE OF UTERUS     age 42    right elbow   surgery  March 26, 2002   right hand ulner nerve surgery  March 26, 2002   I and d done right hand/wrist   right shoulder humerus fx  july 2003   surgery done   right total hip arthroplasty  2008   right total knee arthroplasty  sept 2012   right wrist plating  2004   TONSILLECTOMY  age 57   TOTAL HIP ARTHROPLASTY  10/22/2011   Procedure: TOTAL HIP ARTHROPLASTY ANTERIOR APPROACH;  Surgeon: Donnice JONETTA Car, MD;  Location: [facility];  Service: Orthopedics;  Laterality: Left;  Left Total Hip Arthroplasty,  Anterior Approach    TOTAL KNEE ARTHROPLASTY Left 04/28/2022   Procedure: TOTAL KNEE ARTHROPLASTY;  Surgeon: Car Donnice, MD;  Location: [facility];  Service: Orthopedics;  Laterality: Left;   Patient Active Problem List   Diagnosis Date Noted   S/P total knee arthroplasty, left  04/28/2022   Parkinson's disease (HCC) 11/28/2021   B12 deficiency 05/28/2021   Cervical arthritis 09/27/2017   Open-angle glaucoma 05/14/2016   Generalized anxiety disorder 02/11/2016   Overweight (BMI 25.0-29.9) 04/25/2015   Medication management 02/12/2014   Essential hypertension 08/08/2013   Hyperlipidemia, mixed 08/08/2013   Abnormal glucose 08/08/2013   Vitamin D  deficiency 08/08/2013    PCP: Theophilus Andrews, Tully GRADE, MD   REFERRING PROVIDER: Patel, Donika K, DO  REFERRING DIAG: [phone] (ICD-10-CM) - Chronic bilateral low back pain without sciatica  Rationale for Evaluation and Treatment: Rehabilitation  THERAPY DIAG:  Other low back pain  Muscle weakness (generalized)  Other abnormalities of gait and mobility  ONSET DATE: chronic  SUBJECTIVE:  SUBJECTIVE STATEMENT:  Reports continued 10/10 at worst.  Awakes with minimal pain but symptoms onset as day goes on.  Has not been compliant with HEP but has been busy with ADLs and duties as a CG.     EVAL: In 2022, she recalls noticing that she began walking with a limp.  She has low back pain sometimes at the end of the day.  She starting using a cane around the same time.  She had left knee replacement in 2023 and continued to walk with a limp.  She is very active and continues to go to balance class and water exercise.  She noticed that if she sits too long, she has pressure in the low back.  She has weakness in the right leg and has some difficulty with stairs and has to manually lift her right leg to get it into her car.  She denies weakness in the right leg or arms.  No problems with speech/swallow.  No numbness/tingling of the arms or legs.     She is retired engineer, civil (consulting) from the surgical center.  She worked full-time until she was 48.  She was very independent and kept up with her yard work until 2 years ago when her right leg started to get weak.  Fortunately, she has no falls except a mechanical one when she lost balance while trying to push a stake into the ground.     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 MRI lumbar spine showed left foraminal stenosis at L2-3 and L3-4.  Per my review, there is atrophy of the paraspinal muscles.  EMG shows neurogenic changes involving rectus femoris, adductor longus, iliopsoas, and gluteus medius muscles.  No evidence of myopathy or active denervation.     UPDATE 04/10/2024:  She has low back pain and achy pain on top of her feet which is worse with prolonged activity.  She gets relief with tramadol. She uses a cane for support. 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  There has been no change in her leg weakness.  She continues to feel that at times, her foot will turn inwards.  Fortunately, she has not had any falls.   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  Myopathy is less likely given normal CK and no findings of myopathy on EMG.  EMG showed diffuse neurogenic changes involving the hip muscles without active denervation. MRI of the hip shows severe muscle atrophy of the iliopsoas, gluteus medius, and gluteus minimus along with paraspinal muscle atrophy.  There is no evidence of hyperreflexia or increased tone to suggest UMN pathology. Previously discussed additional testing such as CSF testing or muscle biopsy, and opted to monitor due to stability of symptoms.  PAIN:  Are you having pain? Yes: NPRS scale: 0/10 Pain location: low back Pain description: ache Aggravating factors: position changes Relieving factors: sitting   PRECAUTIONS: None  RED FLAGS: None   WEIGHT BEARING RESTRICTIONS: No  FALLS:  Has patient  fallen in last 6 months? No  OCCUPATION: retired  PLOF: Independent  PATIENT GOALS: To manage my low back pain  NEXT MD VISIT: TBD  OBJECTIVE:  Note: Objective measures were completed at Evaluation unless otherwise noted.  DIAGNOSTIC FINDINGS:  IMPRESSION: 1. 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 2. Moderate left L2-3 and L3-4 neural  foraminal stenosis. 3. Left lateral recess narrowing at L1-2 and L2-3, which could cause left L2 and L3 radiculopathy.     Electronically Signed   By: Franky Stanford M.D.   On: 03/17/2023 01:53  PATIENT SURVEYS:  Modified Oswestry:   Interpretation of scores: Score Category Description  0-20% Minimal Disability The patient can cope with most living activities. Usually no treatment is indicated apart from advice on lifting, sitting and exercise  21-40% Moderate Disability The patient experiences more pain and difficulty with sitting, lifting and standing. Travel and social life are more difficult and they may be disabled from work. Personal care, sexual activity and sleeping are not grossly affected, and the patient can usually be managed by conservative means  41-60% Severe Disability Pain remains the main problem in this group, but activities of daily living are affected. These patients require a detailed investigation  61-80% Crippled Back pain impinges on all aspects of the patient's life. Positive intervention is required  81-100% Bed-bound  These patients are either bed-bound or exaggerating their symptoms  Bluford FORBES Zoe DELENA Karon DELENA, et al. Surgery versus conservative management of stable thoracolumbar fracture: the PRESTO feasibility RCT. [place] ([place]): [employer]; 2021 Nov. [facility] ([facility]) Technology Assessment, No. 25.62.) Appendix 3, Oswestry Disability Index category descriptors. Available from: Findjewelers.cz  Minimally Clinically Important  Difference (MCID) = 12.8%   MUSCLE LENGTH: Hamstrings: Right 70 deg; Left 70 deg   POSTURE: decreased lumbar lordosis and flexed hip posture, knee valgus   PALPATION: deferred  LUMBAR ROM:   AROM eval  Flexion 90%  Extension 10%  Right lateral flexion 50%  Left lateral flexion 50%  Right rotation   Left rotation    (Blank rows = not tested)  LOWER EXTREMITY ROM:   WFL throughout  Active  Right eval Left eval  Hip flexion    Hip extension    Hip abduction    Hip adduction    Hip internal rotation    Hip external rotation    Knee flexion    Knee extension    Ankle dorsiflexion    Ankle plantarflexion    Ankle inversion    Ankle eversion     (Blank rows = not tested)  LOWER EXTREMITY MMT:  see 30s chair stand test  MMT Right eval Left eval 06/12/24 L 10/30 R/L  Hip flexion   4- 4-/4  Hip extension   4- /  Hip abduction   3 2+/3-  Hip adduction    4-/4  Hip internal rotation    4/5  Hip external rotation    3/4  Knee flexion    4/4  Knee extension    4/4  Ankle dorsiflexion      Ankle plantarflexion      Ankle inversion      Ankle eversion       (Blank rows = not tested)  LUMBAR SPECIAL TESTS:  Straight leg raise test: Negative and Slump test: Negative  FUNCTIONAL TESTS:  30 seconds chair stand test 5 reps  GAIT: Distance walked: 23ft x2 Assistive device utilized: Single point cane Level of assistance: Complete Independence Comments: slow cadence  TREATMENT:  [facility] Adult PT Treatment:                                                DATE: 08/07/24 Therapeutic Exercise: Nustep L6  8 min (focus on preventing valgus collapse) Neuromuscular re-ed: P-ball curl ups 15x B, 15/15  Supine hip fallouts BluTB 15/15 Bridge against BluTB 15x STS with OH reach to facilitate hip/trunk extenison Therapeutic Activity: Leg press 35# B 20# 15/15 Heel raise from 4 in block 15x  [facility] Adult PT Treatment:                                                DATE:  08/03/24 Therapeutic Exercise: Supine alternating clam - blue TB - 2x10 ea Fig 4 bridge - 3x6x3s - small range Bridge  Therapeutic Activity: Fig 8 cone walking, hurdle walking, 6'' step up - CGA STS 15# - 3x5   [facility] Adult PT Treatment:                                                DATE: 07/21/24 Therapeutic Exercise: Seated BTB clam STS banded with Blue band Banded Bridge Blue band Figure 4 bridge 10 each  Heel raises x 10    Therapeutic Activity: Gait with SPC Pt given photo and video feedback of her gait and valgus right knee 2 inch step with right hip hikes to hip abduction Alternating hip abduction at counter      [facility] Adult PT Treatment:                                                DATE: 07/19/24 Therapeutic Exercise: HEP update adding gastroc stretches and DF strengthening.  Gait training emphasizing overcorrection of valgus collapse by ambulating in pigeon toe configuration.   Self Care: Instructed in various gait strategies to minimize valgus collapse due to primarily to hip weakness resulting in internal rotation of hips creating a valgus collapse and a pronation moment at ankles which limited patient's ability to dorsiflex and clear feet during turning activities.  This patient was able to partially correct postural faults with overcompensation when instructed to walk with the pigeon toe pattern which promoted bilateral supination and minimized valgus collapse.  Patient did report a decrease in midfoot pain at end of session which can be attributed to restoration of foot mechanics.  Home exercise program updated to add gastroc stretching as well as dorsiflexion strengthening against the wall which allowed patient to adopt full knee extension and minimize postural deformities                                                                                                                       PATIENT EDUCATION:  Education details: Discussed eval findings, rehab  rationale and POC and patient is in  agreement  Person educated: Patient Education method: Explanation and Handouts Education comprehension: verbalized understanding and needs further education  HOME EXERCISE PROGRAM: Access Code: [email or web address] [facility] URL: [email or web address] Date: 06/14/2024 Prepared by: Helene Gasmen  Exercises - Supine Bridge with Resistance Band  - 1-2 x daily - 5 x weekly - 3 sets - 10 reps - Hooklying Single Leg Bent Knee Fallouts with Resistance  - 1-2 x daily - 5 x weekly - 3 sets - 10 reps - Static Prone on Elbows  - 1-2 x daily - 5 x weekly - 1 sets - 2 min hold - Sit to Stand Without Arm Support  - 1 x daily - 4-7 x weekly - 3 sets - 5-10 reps - Standing March with Counter Support  - 1 x daily - 7 x weekly - 2 sets - 10 reps - Forward Step Touch  - 1 x daily - 7 x weekly - 3 sets - 10 reps - Sideways Step Touch  - 1 x daily - 7 x weekly - 3 sets - 10 reps  ASSESSMENT:  CLINICAL IMPRESSION: Emphasized BLE and trunk strength ensuring good adherence to BM.  Facilitated more upright posture when ambulating.  Discussed need to perform exercises b/t treatment sessions as well as take recovery time as her current CG duties due not allow.  EVAL: Patient is a 83 y.o. female who was seen today for physical therapy evaluation and treatment for chronic low back pain and limited mobility due to underlying degenerative changes including stenosis.  Imaging studies show atrophy of lumbopelvic musculature.  BLE ROM and flexibility is functional and no neural tension signs noted.  Patient is a good candidate for OPPT to include aquatic PT, core strengthening and lumbar spine flexibility tasks.  OBJECTIVE IMPAIRMENTS: Abnormal gait, decreased activity tolerance, decreased coordination, decreased mobility, difficulty walking, decreased ROM, decreased strength, impaired perceived functional ability, improper body mechanics, postural dysfunction, and pain.   ACTIVITY  LIMITATIONS: carrying, lifting, bending, standing, sleeping, transfers, and bed mobility  PERSONAL FACTORS: Age, Fitness, Past/current experiences, and Time since onset of injury/illness/exacerbation are also affecting patient's functional outcome.   REHAB POTENTIAL: Good  CLINICAL DECISION MAKING: Evolving/moderate complexity  EVALUATION COMPLEXITY: Moderate   GOALS: Goals reviewed with patient? No  SHORT TERM GOALS: Target date: 05/24/2024  Patient to demonstrate independence in HEP  Baseline: F9MG [facility] Goal status: MET  2.  Assess 2 MWT  Baseline: TBD; 05/18/24 53/56 Goal status: MET  3.  Assess BERG for balance deficits Baseline: TBD; 05/18/24 53/56 Goal status: MET  LONG TERM GOALS: Target date: 06/21/2024 extended 08/24/2024   Patient will acknowledge 4/10 pain at least once during episode of care   Baseline: 5/10 10/30: can reach 4/10 Goal status: MET  2.  Patient will score at least 10/50 on ODI to signify clinically meaningful improvement in functional abilities.   Baseline: 16/50 10/30: 4/50 Goal status: MET  3.  Patient will increase 30s chair stand reps from 5 to 8 without arms to demonstrate and improved functional ability with less pain/difficulty as well as reduce fall risk.  Baseline: 5 10/30: 10 Goal status: MET  4.  Assess BERG goal as needed Baseline: TBD; no need Goal status: MET  5.  Assess 2 MWT for progress Baseline: 247ft  10/30:  308 Goal status: ODI  6.  Zeena will demonstrate significant improvement in R compensated trendelenburg gait pattern  Evaluation/Baseline: significant deviation Goal status: NEW  7.  Jakeira will be able to stand for >20'' in  R SLS stance, to show a significant improvement in balance in order to reduce fall risk   Evaluation/Baseline: 4'' R, 15''+ L Goal status: NEW  8.  Kadejah will improve the following MMTs to >/= 4/5 to show improvement in strength:  R hip ER and abd   Evaluation/Baseline: see chart in  note Goal status: NEW   PLAN:  PT FREQUENCY: 1-2x/week  PT DURATION: 8 weeks  PLANNED INTERVENTIONS: 97110-Therapeutic exercises, 97530- Therapeutic activity, 97112- Neuromuscular re-education, 97535- Self Care, [id number]- Manual therapy, [id number]- Gait training, [phone]- Aquatic Therapy, [phone] (1-2 muscles), [phone] (3+ muscles)- Dry Needling, Patient/Family education, Balance training, and Stair training.  PLAN FOR NEXT SESSION: HEP review and update, manual techniques as appropriate, aerobic tasks, ROM and flexibility activities, strengthening and PREs, TPDN, gait and balance training as needed     Reyes CHRISTELLA Kohut PT 08/07/24 2:51 PM Phone: [phone] Fax: [phone]

## 2024-08-07 ENCOUNTER — Ambulatory Visit

## 2024-08-07 DIAGNOSIS — R2689 Other abnormalities of gait and mobility: Secondary | ICD-10-CM

## 2024-08-07 DIAGNOSIS — M5459 Other low back pain: Secondary | ICD-10-CM

## 2024-08-07 DIAGNOSIS — M6281 Muscle weakness (generalized): Secondary | ICD-10-CM

## 2024-08-10 ENCOUNTER — Ambulatory Visit: Admitting: Physical Therapy

## 2024-08-10 ENCOUNTER — Encounter: Payer: Self-pay | Admitting: Physical Therapy

## 2024-08-10 DIAGNOSIS — M6281 Muscle weakness (generalized): Secondary | ICD-10-CM

## 2024-08-10 DIAGNOSIS — R2689 Other abnormalities of gait and mobility: Secondary | ICD-10-CM

## 2024-08-10 DIAGNOSIS — M5459 Other low back pain: Secondary | ICD-10-CM | POA: Diagnosis not present

## 2024-08-10 NOTE — Therapy (Signed)
 DAILY NOTE      Patient Name: Gwendolyn [name] MRN: [id number] DOB:[birth year], 83 y.o., female Today's Date: 08/10/2024  END OF SESSION:  PT End of Session - 08/10/24 1332     Visit Number 21    Number of Visits 32    Date for Recertification  08/24/24    Authorization Type HTA    PT Start Time 1332    PT Stop Time 1413    PT Time Calculation (min) 41 min    Activity Tolerance Patient tolerated treatment well    Behavior During Therapy [facility] for tasks assessed/performed         Past Medical History:  Diagnosis Date   Allergy    Arthritis    Blood transfusion without reported diagnosis    Cataract    removed both eyes    Cervical radicular pain 02/11/2016   Glaucoma    low pressure glaucoma    Hyperlipidemia    Hypertension    Osteopenia    Parkinson's disease (HCC)    Pelvis fracture (HCC) 03/26/2002   both sides fx   Positive colorectal cancer screening using Cologuard test 04/14/2017   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    Vitamin D  deficiency    Past Surgical History:  Procedure Laterality Date   COLONOSCOPY     DILATION AND CURETTAGE OF UTERUS     age 68    right elbow   surgery  March 26, 2002   right hand ulner nerve surgery  March 26, 2002   I and d done right hand/wrist   right shoulder humerus fx  july 2003   surgery done   right total hip arthroplasty  2008   right total knee arthroplasty  sept 2012   right wrist plating  2004   TONSILLECTOMY  age 44   TOTAL HIP ARTHROPLASTY  10/22/2011   Procedure: TOTAL HIP ARTHROPLASTY ANTERIOR APPROACH;  Surgeon: Donnice JONETTA Car, MD;  Location: [facility];  Service: Orthopedics;  Laterality: Left;  Left Total Hip Arthroplasty,  Anterior Approach    TOTAL KNEE ARTHROPLASTY Left 04/28/2022   Procedure: TOTAL KNEE ARTHROPLASTY;  Surgeon: Car Donnice, MD;  Location: [facility];  Service: Orthopedics;  Laterality: Left;   Patient Active Problem List   Diagnosis Date Noted   S/P total knee arthroplasty, left  04/28/2022   Parkinson's disease (HCC) 11/28/2021   B12 deficiency 05/28/2021   Cervical arthritis 09/27/2017   Open-angle glaucoma 05/14/2016   Generalized anxiety disorder 02/11/2016   Overweight (BMI 25.0-29.9) 04/25/2015   Medication management 02/12/2014   Essential hypertension 08/08/2013   Hyperlipidemia, mixed 08/08/2013   Abnormal glucose 08/08/2013   Vitamin D  deficiency 08/08/2013    PCP: Theophilus Andrews, Tully GRADE, MD   REFERRING PROVIDER: Patel, Donika K, DO  REFERRING DIAG: [phone] (ICD-10-CM) - Chronic bilateral low back pain without sciatica  Rationale for Evaluation and Treatment: Rehabilitation  THERAPY DIAG:  Other low back pain  Muscle weakness (generalized)  Other abnormalities of gait and mobility  ONSET DATE: chronic  SUBJECTIVE:  SUBJECTIVE STATEMENT:    Pt reports that she has been HEP compliant  PERTINENT HISTORY:  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  Myopathy is less likely given normal CK and no findings of myopathy on EMG.  EMG showed diffuse neurogenic changes involving the hip muscles without active denervation. MRI of the hip shows severe muscle atrophy of the iliopsoas, gluteus medius, and gluteus minimus along with paraspinal muscle atrophy.  There is no evidence of hyperreflexia or increased tone to suggest UMN pathology. Previously discussed additional testing such as CSF testing or muscle biopsy, and opted to monitor due to stability of symptoms.  PAIN:  Are you having pain? Yes: NPRS scale: 0/10 Pain location: low back Pain description: ache Aggravating factors: position changes Relieving factors: sitting   PRECAUTIONS: None  RED FLAGS: None   WEIGHT BEARING RESTRICTIONS: No  FALLS:  Has  patient fallen in last 6 months? No  OCCUPATION: retired  PLOF: Independent  PATIENT GOALS: To manage my low back pain  NEXT MD VISIT: TBD  OBJECTIVE:  Note: Objective measures were completed at Evaluation unless otherwise noted.  DIAGNOSTIC FINDINGS:  IMPRESSION: 1. 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 2. 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 3. Left lateral recess narrowing at L1-2 and L2-3, which could cause left L2 and L3 radiculopathy.     Electronically Signed   By: Franky Stanford M.D.   On: 03/17/2023 01:53  PATIENT SURVEYS:  Modified Oswestry:   Interpretation of scores: Score Category Description  0-20% Minimal Disability The patient can cope with most living activities. Usually no treatment is indicated apart from advice on lifting, sitting and exercise  21-40% Moderate Disability The patient experiences more pain and difficulty with sitting, lifting and standing. Travel and social life are more difficult and they may be disabled from work. Personal care, sexual activity and sleeping are not grossly affected, and the patient can usually be managed by conservative means  41-60% Severe Disability Pain remains the main problem in this group, but activities of daily living are affected. These patients require a detailed investigation  61-80% Crippled Back pain impinges on all aspects of the patients life. Positive intervention is required  81-100% Bed-bound  These patients are either bed-bound or exaggerating their symptoms  Bluford FORBES Zoe DELENA Karon DELENA, et al. Surgery versus conservative management of stable thoracolumbar fracture: the PRESTO feasibility RCT. [place] ([place]): [employer]; 2021 Nov. [facility] Technology Assessment, No. 25.62.) Appendix 3, Oswestry Disability Index category descriptors. Available from: [email or web address]  Minimally Clinically  Important Difference (MCID) = 12.8%   MUSCLE LENGTH: Hamstrings: Right 70 deg; Left 70 deg   POSTURE: decreased lumbar lordosis and flexed hip posture, knee valgus   PALPATION: deferred  LUMBAR ROM:   AROM eval  Flexion 90%  Extension 10%  Right lateral flexion 50%  Left lateral flexion 50%  Right rotation   Left rotation    (Blank rows = not tested)  LOWER EXTREMITY ROM:   WFL throughout  Active  Right eval Left eval  Hip flexion    Hip extension    Hip abduction    Hip adduction    Hip internal rotation    Hip external rotation    Knee flexion    Knee extension    Ankle dorsiflexion    Ankle plantarflexion    Ankle inversion    Ankle eversion     (  Blank rows = not tested)  LOWER EXTREMITY MMT:  see 30s chair stand test  MMT Right eval Left eval 06/12/24 L 10/30 R/L  Hip flexion   4- 4-/4  Hip extension   4- /  Hip abduction   3 2+/3-  Hip adduction    4-/4  Hip internal rotation    4/5  Hip external rotation    3/4  Knee flexion    4/4  Knee extension    4/4  Ankle dorsiflexion      Ankle plantarflexion      Ankle inversion      Ankle eversion       (Blank rows = not tested)  LUMBAR SPECIAL TESTS:  Straight leg raise test: Negative and Slump test: Negative  FUNCTIONAL TESTS:  30 seconds chair stand test 5 reps  GAIT: Distance walked: 84ft x2 Assistive device utilized: Single point cane Level of assistance: Complete Independence Comments: slow cadence  TREATMENT:   [facility] Adult PT Treatment:                                                DATE: 08/10/24 Therapeutic Exercise: nu-step L6 63m LE only Supine alternating clam - Black TB - 2x10 ea Fig 4 bridge - 3x6x3s - small range  Therapeutic Activity: Uni cone step overs Standing at counter with R UE support and L march to work on stability in R stance STS 15# - 3x5 Leg press - 55# - 2x5, 3x8 40# uni - seat all the way back, the 1 hole showing (omega)  [facility] Adult PT Treatment:                                                 DATE: 08/07/24 Therapeutic Exercise: Nustep L6 5 min (focus on preventing valgus collapse) Neuromuscular re-ed: P-ball curl ups 15x B, 15/15  Supine hip fallouts BluTB 15/15 Bridge against BluTB 15x STS with OH reach to facilitate hip/trunk extenison Therapeutic Activity: Leg press 35# B 20# 15/15 Heel raise from 4 in block 15x     [facility] Adult PT Treatment:                                                DATE: 07/21/24 Therapeutic Exercise: Seated BTB clam STS banded with Blue band Banded Bridge Blue band Figure 4 bridge 10 each  Heel raises x 10    Therapeutic Activity: Gait with SPC Pt given photo and video feedback of her gait and valgus right knee 2 inch step with right hip hikes to hip abduction Alternating hip abduction at counter      [facility] Adult PT Treatment:                                                DATE: 07/19/24 Therapeutic Exercise: HEP update adding gastroc stretches and DF strengthening.  Gait training emphasizing overcorrection of valgus collapse by ambulating in pigeon toe configuration.  Self Care: Instructed in various gait strategies to minimize valgus collapse due to primarily to hip weakness resulting in internal rotation of hips creating a valgus collapse and a pronation moment at ankles which limited patient's ability to dorsiflex and clear feet during turning activities.  This patient was able to partially correct postural faults with overcompensation when instructed to walk with the pigeon toe pattern which promoted bilateral supination and minimized valgus collapse.  Patient did report a decrease in midfoot pain at end of session which can be attributed to restoration of foot mechanics.  Home exercise program updated to add gastroc stretching as well as dorsiflexion strengthening against the wall which allowed patient to adopt full knee extension and minimize postural deformities                                                                                                                        PATIENT EDUCATION:  Education details: Discussed eval findings, rehab rationale and POC and patient is in agreement  Person educated: Patient Education method: Explanation and Handouts Education comprehension: verbalized understanding and needs further education  HOME EXERCISE PROGRAM: Access Code: [email or web address] URL: [email or web address] Date: 06/14/2024 Prepared by: Helene Gasmen  Exercises - Supine Bridge with Resistance Band  - 1-2 x daily - 5 x weekly - 3 sets - 10 reps - Hooklying Single Leg Bent Knee Fallouts with Resistance  - 1-2 x daily - 5 x weekly - 3 sets - 10 reps - Static Prone on Elbows  - 1-2 x daily - 5 x weekly - 1 sets - 2 min hold - Sit to Stand Without Arm Support  - 1 x daily - 4-7 x weekly - 3 sets - 5-10 reps - Standing March with Counter Support  - 1 x daily - 7 x weekly - 2 sets - 10 reps - Forward Step Touch  - 1 x daily - 7 x weekly - 3 sets - 10 reps - Sideways Step Touch  - 1 x daily - 7 x weekly - 3 sets - 10 reps  ASSESSMENT:  CLINICAL IMPRESSION:   Encouraged regular completion of HEP.  Working on R lateral and posterior hip strengthening to support R stance phase of gait.  Pt has shown some improvement in strength but remains unable to support body weight in R stance without collapse.  Will continue to progress.  EVAL: Patient is a 83 y.o. female who was seen today for physical therapy evaluation and treatment for chronic low back pain and limited mobility due to underlying degenerative changes including stenosis.  Imaging studies show atrophy of lumbopelvic musculature.  BLE ROM and flexibility is functional and no neural tension signs noted.  Patient is a good candidate for OPPT to include aquatic PT, core strengthening and lumbar spine flexibility tasks.  OBJECTIVE IMPAIRMENTS: Abnormal gait, decreased activity tolerance, decreased coordination,  decreased mobility, difficulty walking, decreased ROM, decreased strength, impaired perceived functional ability, improper body mechanics, postural dysfunction, and pain.  ACTIVITY LIMITATIONS: carrying, lifting, bending, standing, sleeping, transfers, and bed mobility  PERSONAL FACTORS: Age, Fitness, Past/current experiences, and Time since onset of injury/illness/exacerbation are also affecting patient's functional outcome.   REHAB POTENTIAL: Good  CLINICAL DECISION MAKING: Evolving/moderate complexity  EVALUATION COMPLEXITY: Moderate   GOALS: Goals reviewed with patient? No  SHORT TERM GOALS: Target date: 05/24/2024  Patient to demonstrate independence in HEP  Baseline: F9MG [facility] Goal status: MET  2.  Assess 2 MWT  Baseline: TBD; 05/18/24 53/56 Goal status: MET  3.  Assess BERG for balance deficits Baseline: TBD; 05/18/24 53/56 Goal status: MET  LONG TERM GOALS: Target date: 06/21/2024 extended 08/24/2024   Patient will acknowledge 4/10 pain at least once during episode of care   Baseline: 5/10 10/30: can reach 4/10 Goal status: MET  2.  Patient will score at least 10/50 on ODI to signify clinically meaningful improvement in functional abilities.   Baseline: 16/50 10/30: 4/50 Goal status: MET  3.  Patient will increase 30s chair stand reps from 5 to 8 without arms to demonstrate and improved functional ability with less pain/difficulty as well as reduce fall risk.  Baseline: 5 10/30: 10 Goal status: MET  4.  Assess BERG goal as needed Baseline: TBD; no need Goal status: MET  5.  Assess 2 MWT for progress Baseline: 274ft  10/30:  308 Goal status: ODI  6.  Phoen[place] will demonstrate significant improvement in R compensated trendelenburg gait pattern  Evaluation/Baseline: significant deviation Goal status: NEW  7.  [place] will be able to stand for >20'' in R SLS stance, to show a significant improvement in balance in order to reduce fall risk    Evaluation/Baseline: 4'' R, 15''+ L Goal status: NEW  8.  Sumayyah will improve the following MMTs to >/= 4/5 to show improvement in strength:  R hip ER and abd   Evaluation/Baseline: see chart in note Goal status: NEW   PLAN:  PT FREQUENCY: 1-2x/week  PT DURATION: 8 weeks  PLANNED INTERVENTIONS: 97110-Therapeutic exercises, 97530- Therapeutic activity, 97112- Neuromuscular re-education, 97535- Self Care, [id number]- Manual therapy, [id number]- Gait training, [id number]- Aquatic Therapy, [phone] (1-2 muscles), [id number] (3+ muscles)- Dry Needling, Patient/Family education, Balance training, and Stair training.  PLAN FOR NEXT SESSION: HEP review and update, manual techniques as appropriate, aerobic tasks, ROM and flexibility activities, strengthening and PREs, TPDN, gait and balance training as needed     Helene FORBES Gasmen PT 08/10/2024 1:33 PM Phone: [phone] Fax: [phone]

## 2024-08-15 ENCOUNTER — Ambulatory Visit: Admitting: Physical Therapy

## 2024-08-15 ENCOUNTER — Encounter: Payer: Self-pay | Admitting: Physical Therapy

## 2024-08-15 DIAGNOSIS — M5459 Other low back pain: Secondary | ICD-10-CM

## 2024-08-15 DIAGNOSIS — M6281 Muscle weakness (generalized): Secondary | ICD-10-CM

## 2024-08-15 DIAGNOSIS — R2689 Other abnormalities of gait and mobility: Secondary | ICD-10-CM

## 2024-08-15 NOTE — Therapy (Signed)
 DAILY NOTE      Patient Name: Gwendolyn [name] MRN: [id number] DOB:09/30/1940, 83 y.o., female Today's Date: 08/15/2024  END OF SESSION:  PT End of Session - 08/15/24 1409     Visit Number 22    Number of Visits 32    Date for Recertification  08/24/24    Authorization Type HTA    PT Start Time 0203    PT Stop Time 0245    PT Time Calculation (min) 42 min         Past Medical History:  Diagnosis Date   Allergy    Arthritis    Blood transfusion without reported diagnosis    Cataract    removed both eyes    Cervical radicular pain 02/11/2016   Glaucoma    low pressure glaucoma    Hyperlipidemia    Hypertension    Osteopenia    Parkinson's disease (HCC)    Pelvis fracture (HCC) 03/26/2002   both sides fx   Positive colorectal cancer screening using Cologuard test 04/14/2017   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    Vitamin D  deficiency    Past Surgical History:  Procedure Laterality Date   COLONOSCOPY     DILATION AND CURETTAGE OF UTERUS     age 74    right elbow   surgery  March 26, 2002   right hand ulner nerve surgery  March 26, 2002   I and d done right hand/wrist   right shoulder humerus fx  july 2003   surgery done   right total hip arthroplasty  2008   right total knee arthroplasty  sept 2012   right wrist plating  2004   TONSILLECTOMY  age 58   TOTAL HIP ARTHROPLASTY  10/22/2011   Procedure: TOTAL HIP ARTHROPLASTY ANTERIOR APPROACH;  Surgeon: Donnice JONETTA Car, MD;  Location: [facility];  Service: Orthopedics;  Laterality: Left;  Left Total Hip Arthroplasty,  Anterior Approach    TOTAL KNEE ARTHROPLASTY Left 04/28/2022   Procedure: TOTAL KNEE ARTHROPLASTY;  Surgeon: Car Donnice, MD;  Location: [facility];  Service: Orthopedics;  Laterality: Left;   Patient Active Problem List   Diagnosis Date Noted   S/P total knee arthroplasty, 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   Parkinson's disease (HCC) 11/28/2021   B12 deficiency 05/28/2021   Cervical arthritis 09/27/2017    Open-angle glaucoma 05/14/2016   Generalized anxiety disorder 02/11/2016   Overweight (BMI 25.0-29.9) 04/25/2015   Medication management 02/12/2014   Essential hypertension 08/08/2013   Hyperlipidemia, mixed 08/08/2013   Abnormal glucose 08/08/2013   Vitamin D  deficiency 08/08/2013    PCP: Theophilus Andrews, Tully GRADE, MD   REFERRING PROVIDER: Patel, Donika K, DO  REFERRING DIAG: [phone] (ICD-10-CM) - Chronic bilateral low back pain without sciatica  Rationale for Evaluation and Treatment: Rehabilitation  THERAPY DIAG:  Other low back pain  Muscle weakness (generalized)  Other abnormalities of gait and mobility  ONSET DATE: chronic  SUBJECTIVE:  SUBJECTIVE STATEMENT:    Pt reports that she has been HEP compliant  PERTINENT HISTORY:  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  Myopathy is less likely given normal CK and no findings of myopathy on EMG.  EMG showed diffuse neurogenic changes involving the hip muscles without active denervation. MRI of the hip shows severe muscle atrophy of the iliopsoas, gluteus medius, and gluteus minimus along with paraspinal muscle atrophy.  There is no evidence of hyperreflexia or increased tone to suggest UMN pathology. Previously discussed additional testing such as CSF testing or muscle biopsy, and opted to monitor due to stability of symptoms.  PAIN:  Are you having pain? Yes: NPRS scale: 0/10 Pain location: low back Pain description: ache Aggravating factors: position changes Relieving factors: sitting   PRECAUTIONS: None  RED FLAGS: None   WEIGHT BEARING RESTRICTIONS: No  FALLS:  Has patient fallen in last 6 months? No  OCCUPATION: retired  PLOF: Independent  PATIENT GOALS: To manage my low back  pain  NEXT MD VISIT: TBD  OBJECTIVE:  Note: Objective measures were completed at Evaluation unless otherwise noted.  DIAGNOSTIC FINDINGS:  IMPRESSION: 1. 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 2. 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 3. Left lateral recess narrowing at L1-2 and L2-3, which could cause left L2 and L3 radiculopathy.     Electronically Signed   By: Franky Stanford M.D.   On: 03/17/2023 01:53  PATIENT SURVEYS:  Modified Oswestry:   Interpretation of scores: Score Category Description  0-20% Minimal Disability The patient can cope with most living activities. Usually no treatment is indicated apart from advice on lifting, sitting and exercise  21-40% Moderate Disability The patient experiences more pain and difficulty with sitting, lifting and standing. Travel and social life are more difficult and they may be disabled from work. Personal care, sexual activity and sleeping are not grossly affected, and the patient can usually be managed by conservative means  41-60% Severe Disability Pain remains the main problem in this group, but activities of daily living are affected. These patients require a detailed investigation  61-80% Crippled Back pain impinges on all aspects of the patients life. Positive intervention is required  81-100% Bed-bound  These patients are either bed-bound or exaggerating their symptoms  Bluford FORBES Zoe DELENA Karon DELENA, et al. Surgery versus conservative management of stable thoracolumbar fracture: the PRESTO feasibility RCT. [place] ([place]): [employer]; 2021 Nov. [facility] Technology Assessment, No. 25.62.) Appendix 3, Oswestry Disability Index category descriptors. Available from: [email or web address].cz  Minimally Clinically Important Difference (MCID) = 12.8%   MUSCLE LENGTH: Hamstrings: Right 70 deg; Left 70 deg   POSTURE: decreased lumbar  lordosis and flexed hip posture, knee valgus   PALPATION: deferred  LUMBAR ROM:   AROM eval  Flexion 90%  Extension 10%  Right lateral flexion 50%  Left lateral flexion 50%  Right rotation   Left rotation    (Blank rows = not tested)  LOWER EXTREMITY ROM:   WFL throughout  Active  Right eval Left eval  Hip flexion    Hip extension    Hip abduction    Hip adduction    Hip internal rotation    Hip external rotation    Knee flexion    Knee extension    Ankle dorsiflexion    Ankle plantarflexion    Ankle inversion    Ankle eversion     (  Blank rows = not tested)  LOWER EXTREMITY MMT:  see 30s chair stand test  MMT Right eval Left eval 06/12/24 L 10/30 R/L  Hip flexion   4- 4-/4  Hip extension   4- /  Hip abduction   3 2+/3-  Hip adduction    4-/4  Hip internal rotation    4/5  Hip external rotation    3/4  Knee flexion    4/4  Knee extension    4/4  Ankle dorsiflexion      Ankle plantarflexion      Ankle inversion      Ankle eversion       (Blank rows = not tested)  LUMBAR SPECIAL TESTS:  Straight leg raise test: Negative and Slump test: Negative  FUNCTIONAL TESTS:  30 seconds chair stand test 5 reps  GAIT: Distance walked: 21ft x2 Assistive device utilized: Single point cane Level of assistance: Complete Independence Comments: slow cadence  TREATMENT:  [facility] Adult PT Treatment:                                                DATE: 08/15/24 Therapeutic Exercise: Seated scap squeeze Figure 4 bridge 2 x 10 each  Bridge with band black Bent knee fall outs with black resistance band  STS with pullover holding cane x 10 Standing Row GTB 10 x 2      [facility] Adult PT Treatment:                                                DATE: 08/10/24 Therapeutic Exercise: nu-step L6 17m LE only Supine alternating clam - Black TB - 2x10 ea Fig 4 bridge - 3x6x3s - small range  Therapeutic Activity: Uni cone step overs Standing at counter with R UE support and  L march to work on stability in R stance STS 15# - 3x5 Leg press - 55# - 2x5, 3x8 40# uni - seat all the way back, the 1 hole showing (omega)  [facility] Adult PT Treatment:                                                DATE: 08/07/24 Therapeutic Exercise: Nustep L6 5 min (focus on preventing valgus collapse) Neuromuscular re-ed: P-ball curl ups 15x B, 15/15  Supine hip fallouts BluTB 15/15 Bridge against BluTB 15x STS with OH reach to facilitate hip/trunk extenison Therapeutic Activity: Leg press 35# B 20# 15/15 Heel raise from 4 in block 15x     [facility] Adult PT Treatment:                                                DATE: 07/21/24 Therapeutic Exercise: Seated BTB clam STS banded with Blue band Banded Bridge Blue band Figure 4 bridge 10 each  Heel raises x 10    Therapeutic Activity: Gait with SPC Pt given photo and video feedback of her gait and valgus right knee 2 inch step  with right hip hikes to hip abduction Alternating hip abduction at counter      [facility] Adult PT Treatment:                                                DATE: 07/19/24 Therapeutic Exercise: HEP update adding gastroc stretches and DF strengthening.  Gait training emphasizing overcorrection of valgus collapse by ambulating in pigeon toe configuration.   Self Care: Instructed in various gait strategies to minimize valgus collapse due to primarily to hip weakness resulting in internal rotation of hips creating a valgus collapse and a pronation moment at ankles which limited patient's ability to dorsiflex and clear feet during turning activities.  This patient was able to partially correct postural faults with overcompensation when instructed to walk with the pigeon toe pattern which promoted bilateral supination and minimized valgus collapse.  Patient did report a decrease in midfoot pain at end of session which can be attributed to restoration of foot mechanics.  Home exercise program updated to add gastroc  stretching as well as dorsiflexion strengthening against the wall which allowed patient to adopt full knee extension and minimize postural deformities                                                                                                                       PATIENT EDUCATION:  Education details: Discussed eval findings, rehab rationale and POC and patient is in agreement  Person educated: Patient Education method: Explanation and Handouts Education comprehension: verbalized understanding and needs further education  HOME EXERCISE PROGRAM: Access Code: [email or web address] [facility] URL: [email or web address] Date: 06/14/2024 Prepared by: Helene Gasmen  Exercises - Supine Bridge with Resistance Band  - 1-2 x daily - 5 x weekly - 3 sets - 10 reps - Hooklying Single Leg Bent Knee Fallouts with Resistance  - 1-2 x daily - 5 x weekly - 3 sets - 10 reps - Static Prone on Elbows  - 1-2 x daily - 5 x weekly - 1 sets - 2 min hold - Sit to Stand Without Arm Support  - 1 x daily - 4-7 x weekly - 3 sets - 5-10 reps - Standing March with Counter Support  - 1 x daily - 7 x weekly - 2 sets - 10 reps - Forward Step Touch  - 1 x daily - 7 x weekly - 3 sets - 10 reps - Sideways Step Touch  - 1 x daily - 7 x weekly - 3 sets - 10 reps  ASSESSMENT:  CLINICAL IMPRESSION:  Pt interested in postural exercises and was instructed in standing row and issued for HEP. Otherwise continued LE strengthening.   EVAL: Patient is a 83 y.o. female who was seen today for physical therapy evaluation and treatment for chronic low back pain and limited mobility due to underlying degenerative changes including stenosis.  Imaging studies  show atrophy of lumbopelvic musculature.  BLE ROM and flexibility is functional and no neural tension signs noted.  Patient is a good candidate for OPPT to include aquatic PT, core strengthening and lumbar spine flexibility tasks.  OBJECTIVE IMPAIRMENTS: Abnormal gait, decreased  activity tolerance, decreased coordination, decreased mobility, difficulty walking, decreased ROM, decreased strength, impaired perceived functional ability, improper body mechanics, postural dysfunction, and pain.   ACTIVITY LIMITATIONS: carrying, lifting, bending, standing, sleeping, transfers, and bed mobility  PERSONAL FACTORS: Age, Fitness, Past/current experiences, and Time since onset of injury/illness/exacerbation are also affecting patient's functional outcome.   REHAB POTENTIAL: Good  CLINICAL DECISION MAKING: Evolving/moderate complexity  EVALUATION COMPLEXITY: Moderate   GOALS: Goals reviewed with patient? No  SHORT TERM GOALS: Target date: 05/24/2024  Patient to demonstrate independence in HEP  Baseline: F9MG [facility] Goal status: MET  2.  Assess 2 MWT  Baseline: TBD; 05/18/24 53/56 Goal status: MET  3.  Assess BERG for balance deficits Baseline: TBD; 05/18/24 53/56 Goal status: MET  LONG TERM GOALS: Target date: 06/21/2024 extended 08/24/2024   Patient will acknowledge 4/10 pain at least once during episode of care   Baseline: 5/10 10/30: can reach 4/10 Goal status: MET  2.  Patient will score at least 10/50 on ODI to signify clinically meaningful improvement in functional abilities.   Baseline: 16/50 10/30: 4/50 Goal status: MET  3.  Patient will increase 30s chair stand reps from 5 to 8 without arms to demonstrate and improved functional ability with less pain/difficulty as well as reduce fall risk.  Baseline: 5 10/30: 10 Goal status: MET  4.  Assess BERG goal as needed Baseline: TBD; no need Goal status: MET  5.  Assess 2 MWT for progress Baseline: 25ft  10/30:  308 Goal status: ODI  6.  Wyona will demonstrate significant improvement in R compensated trendelenburg gait pattern  Evaluation/Baseline: significant deviation Goal status: NEW  7.  Josee will be able to stand for >20'' in R SLS stance, to show a significant improvement in balance in  order to reduce fall risk   Evaluation/Baseline: 4'' R, 15''+ L Goal status: NEW  8.  [place] will improve the following MMTs to >/= 4/5 to show improvement in strength:  R hip ER and abd   Evaluation/Baseline: see chart in note Goal status: NEW   PLAN:  PT FREQUENCY: 1-2x/week  PT DURATION: 8 weeks  PLANNED INTERVENTIONS: 97110-Therapeutic exercises, 97530- Therapeutic activity, 97112- Neuromuscular re-education, 97535- Self Care, [id number]- Manual therapy, [id number]- Gait training, [phone]- Aquatic Therapy, [phone] (1-2 muscles), [id number] (3+ muscles)- Dry Needling, Patient/Family education, Balance training, and Stair training.  PLAN FOR NEXT SESSION: HEP review and update, manual techniques as appropriate, aerobic tasks, ROM and flexibility activities, strengthening and PREs, TPDN, gait and balance training as needed     Harlene CHRISTELLA Persons PTA 08/15/2024 2:56 PM Phone: [phone] Fax: [phone]

## 2024-08-18 ENCOUNTER — Ambulatory Visit: Admitting: Physical Therapy

## 2024-08-18 ENCOUNTER — Encounter: Payer: Self-pay | Admitting: Physical Therapy

## 2024-08-18 DIAGNOSIS — R2689 Other abnormalities of gait and mobility: Secondary | ICD-10-CM

## 2024-08-18 DIAGNOSIS — M5459 Other low back pain: Secondary | ICD-10-CM

## 2024-08-18 DIAGNOSIS — M6281 Muscle weakness (generalized): Secondary | ICD-10-CM

## 2024-08-18 NOTE — Therapy (Signed)
 " DAILY NOTE      Patient Name: Gwendolyn [name] MRN: [id number] DOB:13-Mar-1941, 83 y.o., female Today's Date: 08/18/2024  END OF SESSION:  PT End of Session - 08/18/24 1014     Visit Number 23    Number of Visits 32    Date for Recertification  08/24/24    Authorization Type HTA    PT Start Time 1015    PT Stop Time 1057    PT Time Calculation (min) 42 min         Past Medical History:  Diagnosis Date   Allergy    Arthritis    Blood transfusion without reported diagnosis    Cataract    removed both eyes    Cervical radicular pain 02/11/2016   Glaucoma    low pressure glaucoma    Hyperlipidemia    Hypertension    Osteopenia    Parkinson's disease (HCC)    Pelvis fracture (HCC) 03/26/2002   both sides fx   Positive colorectal cancer screening using Cologuard test 04/14/2017   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    Vitamin D  deficiency    Past Surgical History:  Procedure Laterality Date   COLONOSCOPY     DILATION AND CURETTAGE OF UTERUS     age 4    right elbow   surgery  March 26, 2002   right hand ulner nerve surgery  March 26, 2002   I and d done right hand/wrist   right shoulder humerus fx  july 2003   surgery done   right total hip arthroplasty  2008   right total knee arthroplasty  sept 2012   right wrist plating  2004   TONSILLECTOMY  age 61   TOTAL HIP ARTHROPLASTY  10/22/2011   Procedure: TOTAL HIP ARTHROPLASTY ANTERIOR APPROACH;  Surgeon: Donnice JONETTA Car, MD;  Location: [facility];  Service: Orthopedics;  Laterality: Left;  Left Total Hip Arthroplasty,  Anterior Approach    TOTAL KNEE ARTHROPLASTY Left 04/28/2022   Procedure: TOTAL KNEE ARTHROPLASTY;  Surgeon: Car Donnice, MD;  Location: [facility];  Service: Orthopedics;  Laterality: Left;   Patient Active Problem List   Diagnosis Date Noted   S/P total knee arthroplasty, 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   Parkinson's disease (HCC) 11/28/2021   B12 deficiency 05/28/2021   Cervical arthritis 09/27/2017    Open-angle glaucoma 05/14/2016   Generalized anxiety disorder 02/11/2016   Overweight (BMI 25.0-29.9) 04/25/2015   Medication management 02/12/2014   Essential hypertension 08/08/2013   Hyperlipidemia, mixed 08/08/2013   Abnormal glucose 08/08/2013   Vitamin D  deficiency 08/08/2013    PCP: Theophilus Andrews, Tully GRADE, MD   REFERRING PROVIDER: Patel, Donika K, DO  REFERRING DIAG: [phone] (ICD-10-CM) - Chronic bilateral low back pain without sciatica  Rationale for Evaluation and Treatment: Rehabilitation  THERAPY DIAG:  Other low back pain  Muscle weakness (generalized)  Other abnormalities of gait and mobility  ONSET DATE: chronic  SUBJECTIVE:  SUBJECTIVE STATEMENT:    Pt reports that she continues to have low back pain.  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  Myopathy is less likely given normal CK and no findings of myopathy on EMG.  EMG showed diffuse neurogenic changes involving the hip muscles without active denervation. MRI of the hip shows severe muscle atrophy of the iliopsoas, gluteus medius, and gluteus minimus along with paraspinal muscle atrophy.  There is no evidence of hyperreflexia or increased tone to suggest UMN pathology. Previously discussed additional testing such as CSF testing or muscle biopsy, and opted to monitor due to stability of symptoms.  PAIN:  Are you having pain? Yes: NPRS scale: 0/10 Pain location: low back Pain description: ache Aggravating factors: position changes Relieving factors: sitting   PRECAUTIONS: None  RED FLAGS: None   WEIGHT BEARING RESTRICTIONS: No  FALLS:  Has patient fallen in last 6 months? No  OCCUPATION: retired  PLOF: Independent  PATIENT GOALS: To manage my  low back pain  NEXT MD VISIT: TBD  OBJECTIVE:  Note: Objective measures were completed at Evaluation unless otherwise noted.  DIAGNOSTIC FINDINGS:  IMPRESSION: 1. 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 2. 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 3. Left lateral recess narrowing at L1-2 and L2-3, which could cause left L2 and L3 radiculopathy.     Electronically Signed   By: Franky Stanford M.D.   On: 03/17/2023 01:53  PATIENT SURVEYS:  Modified Oswestry:   Interpretation of scores: Score Category Description  0-20% Minimal Disability The patient can cope with most living activities. Usually no treatment is indicated apart from advice on lifting, sitting and exercise  21-40% Moderate Disability The patient experiences more pain and difficulty with sitting, lifting and standing. Travel and social life are more difficult and they may be disabled from work. Personal care, sexual activity and sleeping are not grossly affected, and the patient can usually be managed by conservative means  41-60% Severe Disability Pain remains the main problem in this group, but activities of daily living are affected. These patients require a detailed investigation  61-80% Crippled Back pain impinges on all aspects of the patients life. Positive intervention is required  81-100% Bed-bound  These patients are either bed-bound or exaggerating their symptoms  Bluford FORBES Zoe DELENA Karon DELENA, et al. Surgery versus conservative management of stable thoracolumbar fracture: the PRESTO feasibility RCT. [place] ([place]): [employer]; 2021 Nov. [facility] Technology Assessment, No. 25.62.) Appendix 3, Oswestry Disability Index category descriptors. Available from: [email or web address].cz  Minimally Clinically Important Difference (MCID) = 12.8%   MUSCLE LENGTH: Hamstrings: Right 70 deg; Left 70 deg   POSTURE: decreased  lumbar lordosis and flexed hip posture, knee valgus   PALPATION: deferred  LUMBAR ROM:   AROM eval  Flexion 90%  Extension 10%  Right lateral flexion 50%  Left lateral flexion 50%  Right rotation   Left rotation    (Blank rows = not tested)  LOWER EXTREMITY ROM:   WFL throughout  Active  Right eval Left eval  Hip flexion    Hip extension    Hip abduction    Hip adduction    Hip internal rotation    Hip external rotation    Knee flexion    Knee extension    Ankle dorsiflexion    Ankle plantarflexion    Ankle inversion    Ankle eversion     (  Blank rows = not tested)  LOWER EXTREMITY MMT:  see 30s chair stand test  MMT Right eval Left eval 06/12/24 L 10/30 R/L  Hip flexion   4- 4-/4  Hip extension   4- /  Hip abduction   3 2+/3-  Hip adduction    4-/4  Hip internal rotation    4/5  Hip external rotation    3/4  Knee flexion    4/4  Knee extension    4/4  Ankle dorsiflexion      Ankle plantarflexion      Ankle inversion      Ankle eversion       (Blank rows = not tested)  LUMBAR SPECIAL TESTS:  Straight leg raise test: Negative and Slump test: Negative  FUNCTIONAL TESTS:  30 seconds chair stand test 5 reps  GAIT: Distance walked: 32ft x2 Assistive device utilized: Single point cane Level of assistance: Complete Independence Comments: slow cadence  TREATMENT:   [facility] Adult PT Treatment:                                                DATE: 08/18/24 Therapeutic Exercise: nu-step L8 68m LE only Supine alternating clam - Black TB - 2x6 ea Fig 4 bridge - 3x6x3s - small range  Therapeutic Activity: Hip abd March Leg press - 65#-85# - 3x5, 3x5 45# - 50# uni - seat all the way back, the 1 hole showing (omega) SL knee ext - 2x6 @10 # Bil hs curl - 2x6 - 35# Standing hip abd  [facility] Adult PT Treatment:                                                DATE: 08/15/24 Therapeutic Exercise: Seated scap squeeze Figure 4 bridge 2 x 10 each  Bridge with band  black Bent knee fall outs with black resistance band  STS with pullover holding cane x 10 Standing Row GTB 10 x 2      [facility] Adult PT Treatment:                                                DATE: 08/10/24 Therapeutic Exercise: nu-step L6 11m LE only Supine alternating clam - Black TB - 2x10 ea Fig 4 bridge - 3x6x3s - small range  Therapeutic Activity: Uni cone step overs Standing at counter with R UE support and L march to work on stability in R stance STS 15# - 3x5 Leg press - 55# - 2x5, 3x8 40# uni - seat all the way back, the 1 hole showing (omega)  [facility] Adult PT Treatment:                                                DATE: 08/07/24 Therapeutic Exercise: Nustep L6 5 min (focus on preventing valgus collapse) Neuromuscular re-ed: P-ball curl ups 15x B, 15/15  Supine hip fallouts BluTB 15/15 Bridge against BluTB 15x STS with OH reach to  facilitate hip/trunk extenison Therapeutic Activity: Leg press 35# B 20# 15/15 Heel raise from 4 in block 15x     [facility] Adult PT Treatment:                                                DATE: 07/21/24 Therapeutic Exercise: Seated BTB clam STS banded with Blue band Banded Bridge Blue band Figure 4 bridge 10 each  Heel raises x 10    Therapeutic Activity: Gait with SPC Pt given photo and video feedback of her gait and valgus right knee 2 inch step with right hip hikes to hip abduction Alternating hip abduction at counter      [facility] Adult PT Treatment:                                                DATE: 07/19/24 Therapeutic Exercise: HEP update adding gastroc stretches and DF strengthening.  Gait training emphasizing overcorrection of valgus collapse by ambulating in pigeon toe configuration.   Self Care: Instructed in various gait strategies to minimize valgus collapse due to primarily to hip weakness resulting in internal rotation of hips creating a valgus collapse and a pronation moment at ankles which limited patient's  ability to dorsiflex and clear feet during turning activities.  This patient was able to partially correct postural faults with overcompensation when instructed to walk with the pigeon toe pattern which promoted bilateral supination and minimized valgus collapse.  Patient did report a decrease in midfoot pain at end of session which can be attributed to restoration of foot mechanics.  Home exercise program updated to add gastroc stretching as well as dorsiflexion strengthening against the wall which allowed patient to adopt full knee extension and minimize postural deformities                                                                                                                       PATIENT EDUCATION:  Education details: Discussed eval findings, rehab rationale and POC and patient is in agreement  Person educated: Patient Education method: Explanation and Handouts Education comprehension: verbalized understanding and needs further education  HOME EXERCISE PROGRAM: Access Code: [email or web address] URL: [email or web address] Date: 08/18/2024 Prepared by: Helene Gasmen  Exercises - Figure 4 Bridge  - 1 x daily - 7 x weekly - 3 sets - 5 reps - Hooklying Single Leg Bent Knee Fallouts with Resistance  - 1-2 x daily - 5 x weekly - 3 sets - 6 reps - Staggered Sit-to-Stand  - 1 x daily - 5 x weekly - 3 sets - 5 reps - Standing Hip Abduction with Counter Support  - 1 x daily - 7 x weekly - 3 sets - 6 reps - Standing Hip  Extension with Counter Support  - 1 x daily - 7 x weekly - 3 sets - 6 reps - Standing March with Counter Support  - 1 x daily - 7 x weekly - 3 sets - 6 reps  ASSESSMENT:  CLINICAL IMPRESSION:  Continuing to work on hip abd and ext strengthening in open and closed chain with slow but consistent improvement.  Plan on D/C next visit with HEP.  EVAL: Patient is a 83 y.o. female who was seen today for physical therapy evaluation and treatment for chronic low back pain  and limited mobility due to underlying degenerative changes including stenosis.  Imaging studies show atrophy of lumbopelvic musculature.  BLE ROM and flexibility is functional and no neural tension signs noted.  Patient is a good candidate for OPPT to include aquatic PT, core strengthening and lumbar spine flexibility tasks.  OBJECTIVE IMPAIRMENTS: Abnormal gait, decreased activity tolerance, decreased coordination, decreased mobility, difficulty walking, decreased ROM, decreased strength, impaired perceived functional ability, improper body mechanics, postural dysfunction, and pain.   ACTIVITY LIMITATIONS: carrying, lifting, bending, standing, sleeping, transfers, and bed mobility  PERSONAL FACTORS: Age, Fitness, Past/current experiences, and Time since onset of injury/illness/exacerbation are also affecting patient's functional outcome.   REHAB POTENTIAL: Good  CLINICAL DECISION MAKING: Evolving/moderate complexity  EVALUATION COMPLEXITY: Moderate   GOALS: Goals reviewed with patient? No  SHORT TERM GOALS: Target date: 05/24/2024  Patient to demonstrate independence in HEP  Baseline: F9MG [facility] Goal status: MET  2.  Assess 2 MWT  Baseline: TBD; 05/18/24 53/56 Goal status: MET  3.  Assess BERG for balance deficits Baseline: TBD; 05/18/24 53/56 Goal status: MET  LONG TERM GOALS: Target date: 06/21/2024 extended 08/24/2024   Patient will acknowledge 4/10 pain at least once during episode of care   Baseline: 5/10 10/30: can reach 4/10 Goal status: MET  2.  Patient will score at least 10/50 on ODI to signify clinically meaningful improvement in functional abilities.   Baseline: 16/50 10/30: 4/50 Goal status: MET  3.  Patient will increase 30s chair stand reps from 5 to 8 without arms to demonstrate and improved functional ability with less pain/difficulty as well as reduce fall risk.  Baseline: 5 10/30: 10 Goal status: MET  4.  Assess BERG goal as needed Baseline: TBD; no  need Goal status: MET  5.  Assess 2 MWT for progress Baseline: 276ft  10/30:  308 Goal status: ODI  6.  Matsue will demonstrate significant improvement in R compensated trendelenburg gait pattern  Evaluation/Baseline: significant deviation Goal status: NEW  7.  Carliyah will be able to stand for >20'' in R SLS stance, to show a significant improvement in balance in order to reduce fall risk   Evaluation/Baseline: 4'' R, 15''+ L Goal status: NEW  8.  Eunie will improve the following MMTs to >/= 4/5 to show improvement in strength:  R hip ER and abd   Evaluation/Baseline: see chart in note Goal status: NEW   PLAN:  PT FREQUENCY: 1-2x/week  PT DURATION: 8 weeks  PLANNED INTERVENTIONS: 97110-Therapeutic exercises, 97530- Therapeutic activity, 97112- Neuromuscular re-education, 97535- Self Care, [id number]- Manual therapy, [id number]- Gait training, [phone]- Aquatic Therapy, [phone] (1-2 muscles), [phone] (3+ muscles)- Dry Needling, Patient/Family education, Balance training, and Stair training.  PLAN FOR NEXT SESSION: HEP review and update, manual techniques as appropriate, aerobic tasks, ROM and flexibility activities, strengthening and PREs, TPDN, gait and balance training as needed     Helene FORBES Gasmen PT 08/18/2024 11:02 AM Phone: [phone] Fax: [phone]   "

## 2024-08-21 ENCOUNTER — Encounter: Payer: Self-pay | Admitting: Physical Therapy

## 2024-08-21 ENCOUNTER — Other Ambulatory Visit: Payer: Self-pay | Admitting: Internal Medicine

## 2024-08-21 ENCOUNTER — Ambulatory Visit: Admitting: Physical Therapy

## 2024-08-21 DIAGNOSIS — M5459 Other low back pain: Secondary | ICD-10-CM

## 2024-08-21 DIAGNOSIS — M6281 Muscle weakness (generalized): Secondary | ICD-10-CM

## 2024-08-21 DIAGNOSIS — R2689 Other abnormalities of gait and mobility: Secondary | ICD-10-CM

## 2024-08-21 DIAGNOSIS — G90519 Complex regional pain syndrome I of unspecified upper limb: Secondary | ICD-10-CM

## 2024-08-21 NOTE — Therapy (Signed)
 "   PHYSICAL THERAPY DISCHARGE SUMMARY  Visits from Start of Care: 24  Current functional level related to goals / functional outcomes: See assessment/goals   Remaining deficits: See assessment/goals   Education / Equipment: HEP and D/C plans  Patient agrees to discharge. Patient goals were partially met. Patient is being discharged due to being independent with HEP    Patient Name: Gwendolyn [name] MRN: [id number] DOB:Jan 21, 1941, 83 y.o., female Today's Date: 08/21/2024  END OF SESSION:  PT End of Session - 08/21/24 1329     Visit Number 24    Number of Visits 32    Date for Recertification  08/24/24    Authorization Type HTA    PT Start Time 1330    PT Stop Time 1412    PT Time Calculation (min) 42 min         Past Medical History:  Diagnosis Date   Allergy    Arthritis    Blood transfusion without reported diagnosis    Cataract    removed both eyes    Cervical radicular pain 02/11/2016   Glaucoma    low pressure glaucoma    Hyperlipidemia    Hypertension    Osteopenia    Parkinson's disease (HCC)    Pelvis fracture (HCC) 03/26/2002   both sides fx   Positive colorectal cancer screening using Cologuard test 04/14/2017   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    Vitamin D  deficiency    Past Surgical History:  Procedure Laterality Date   COLONOSCOPY     DILATION AND CURETTAGE OF UTERUS     age 31    right elbow   surgery  March 26, 2002   right hand ulner nerve surgery  March 26, 2002   I and d done right hand/wrist   right shoulder humerus fx  july 2003   surgery done   right total hip arthroplasty  2008   right total knee arthroplasty  sept 2012   right wrist plating  2004   TONSILLECTOMY  age 33   TOTAL HIP ARTHROPLASTY  10/22/2011   Procedure: TOTAL HIP ARTHROPLASTY ANTERIOR APPROACH;  Surgeon: Donnice JONETTA Car, MD;  Location: [facility];  Service: Orthopedics;  Laterality: Left;  Left Total Hip Arthroplasty,  Anterior Approach    TOTAL KNEE  ARTHROPLASTY Left 04/28/2022   Procedure: TOTAL KNEE ARTHROPLASTY;  Surgeon: Car Donnice, MD;  Location: [facility];  Service: Orthopedics;  Laterality: Left;   Patient Active Problem List   Diagnosis Date Noted   S/P total knee arthroplasty, 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   Parkinson's disease (HCC) 11/28/2021   B12 deficiency 05/28/2021   Cervical arthritis 09/27/2017   Open-angle glaucoma 05/14/2016   Generalized anxiety disorder 02/11/2016   Overweight (BMI 25.0-29.9) 04/25/2015   Medication management 02/12/2014   Essential hypertension 08/08/2013   Hyperlipidemia, mixed 08/08/2013   Abnormal glucose 08/08/2013   Vitamin D  deficiency 08/08/2013    PCP: Theophilus Andrews, Tully GRADE, MD   REFERRING PROVIDER: Patel, Donika K, DO  REFERRING DIAG: [phone] (ICD-10-CM) - Chronic bilateral low back pain without sciatica  Rationale for Evaluation and Treatment: Rehabilitation  THERAPY DIAG:  Other low back pain  Muscle weakness (generalized)  Other abnormalities of gait and mobility  ONSET DATE: chronic  SUBJECTIVE:  SUBJECTIVE STATEMENT:    Pt reports that she has had some non painful popping in her R hip recently.  Continues to have back pain by end of day.  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  Myopathy is less likely given normal CK and no findings of myopathy on EMG.  EMG showed diffuse neurogenic changes involving the hip muscles without active denervation. MRI of the hip shows severe muscle atrophy of the iliopsoas, gluteus medius, and gluteus minimus along with paraspinal muscle atrophy.  There is no evidence of hyperreflexia or increased tone to suggest UMN pathology. Previously discussed additional testing such as CSF testing or muscle  biopsy, and opted to monitor due to stability of symptoms.  PAIN:  Are you having pain? Yes: NPRS scale: 0/10 Pain location: low back Pain description: ache Aggravating factors: position changes Relieving factors: sitting   PRECAUTIONS: None  RED FLAGS: None   WEIGHT BEARING RESTRICTIONS: No  FALLS:  Has patient fallen in last 6 months? No  OCCUPATION: retired  PLOF: Independent  PATIENT GOALS: To manage my low back pain  NEXT MD VISIT: TBD  OBJECTIVE:  Note: Objective measures were completed at Evaluation unless otherwise noted.  DIAGNOSTIC FINDINGS:  IMPRESSION: 1. 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 2. 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 3. Left lateral recess narrowing at L1-2 and L2-3, which could cause left L2 and L3 radiculopathy.     Electronically Signed   By: Franky Stanford M.D.   On: 03/17/2023 01:53  PATIENT SURVEYS:  Modified Oswestry:   Interpretation of scores: Score Category Description  0-20% Minimal Disability The patient can cope with most living activities. Usually no treatment is indicated apart from advice on lifting, sitting and exercise  21-40% Moderate Disability The patient experiences more pain and difficulty with sitting, lifting and standing. Travel and social life are more difficult and they may be disabled from work. Personal care, sexual activity and sleeping are not grossly affected, and the patient can usually be managed by conservative means  41-60% Severe Disability Pain remains the main problem in this group, but activities of daily living are affected. These patients require a detailed investigation  61-80% Crippled Back pain impinges on all aspects of the patients life. Positive intervention is required  81-100% Bed-bound  These patients are either bed-bound or exaggerating their symptoms  Bluford FORBES Zoe DELENA Karon DELENA, et al. Surgery versus  conservative management of stable thoracolumbar fracture: the PRESTO feasibility RCT. [place] ([place]): [employer]; 2021 Nov. [facility] Technology Assessment, No. 25.62.) Appendix 3, Oswestry Disability Index category descriptors. Available from: Findjewelers.cz  Minimally Clinically Important Difference (MCID) = 12.8%   MUSCLE LENGTH: Hamstrings: Right 70 deg; Left 70 deg   POSTURE: decreased lumbar lordosis and flexed hip posture, knee valgus   PALPATION: deferred  LUMBAR ROM:   AROM eval  Flexion 90%  Extension 10%  Right lateral flexion 50%  Left lateral flexion 50%  Right rotation   Left rotation    (Blank rows = not tested)  LOWER EXTREMITY ROM:   WFL throughout  Active  Right eval Left eval  Hip flexion    Hip extension    Hip abduction    Hip adduction    Hip internal rotation    Hip external rotation    Knee flexion    Knee extension    Ankle dorsiflexion  Ankle plantarflexion    Ankle inversion    Ankle eversion     (Blank rows = not tested)  LOWER EXTREMITY MMT:  see 30s chair stand test  MMT Right eval Left eval 06/12/24 L 10/30 R/L 12/22 R/L  Hip flexion   4- 4-/4 4/4  Hip extension   4- /   Hip abduction   3 2+/3- 2+/2+  Hip adduction    4-/4 4/4  Hip internal rotation    4/5 4/4+  Hip external rotation    3/4 3+/4  Knee flexion    4/4   Knee extension    4/4   Ankle dorsiflexion       Ankle plantarflexion       Ankle inversion       Ankle eversion        (Blank rows = not tested)  LUMBAR SPECIAL TESTS:  Straight leg raise test: Negative and Slump test: Negative  FUNCTIONAL TESTS:  30 seconds chair stand test 5 reps  GAIT: Distance walked: 39ft x2 Assistive device utilized: Single point cane Level of assistance: Complete Independence Comments: slow cadence  TREATMENT:   [facility] Adult PT Treatment:                                                DATE: 08/18/24 Therapeutic  Exercise: Standing hip flexor stretch - 1' x2 Supine alternating clam - Black TB - 2x6 ea Fig 4 bridge - 3x6x3s - small range  Therapeutic Activity: Hip abd March Leg press - 3x5 45# uni - seat all the way back, the 1st hole showing (omega) Standing hip abd Standing hip ext Squat at counter collecting information for goals, checking progress, and reviewing with patient  [facility] Adult PT Treatment:                                                DATE: 08/15/24 Therapeutic Exercise: Seated scap squeeze Figure 4 bridge 2 x 10 each  Bridge with band black Bent knee fall outs with black resistance band  STS with pullover holding cane x 10 Standing Row GTB 10 x 2      [facility] Adult PT Treatment:                                                DATE: 08/10/24 Therapeutic Exercise: Standing hip abd Supine alternating clam - Black TB - 2x10 ea Fig 4 bridge - 3x6x3s - small range  Therapeutic Activity: Uni cone step overs Standing at counter with R UE support and L march to work on stability in R stance STS 15# - 3x5 Leg press - 55# - 2x5, 3x8 40# uni - seat all the way back, the 1 hole showing (omega)  [facility] Adult PT Treatment:                                                DATE: 08/07/24 Therapeutic Exercise: Nustep L6 5 min (  focus on preventing valgus collapse) Neuromuscular re-ed: P-ball curl ups 15x B, 15/15  Supine hip fallouts BluTB 15/15 Bridge against BluTB 15x STS with OH reach to facilitate hip/trunk extenison Therapeutic Activity: Leg press 35# B 20# 15/15 Heel raise from 4 in block 15x     [facility] Adult PT Treatment:                                                DATE: 07/21/24 Therapeutic Exercise: Seated BTB clam STS banded with Blue band Banded Bridge Blue band Figure 4 bridge 10 each  Heel raises x 10    Therapeutic Activity: Gait with SPC Pt given photo and video feedback of her gait and valgus right knee 2 inch step with right hip hikes to hip  abduction Alternating hip abduction at counter      [facility] Adult PT Treatment:                                                DATE: 07/19/24 Therapeutic Exercise: HEP update adding gastroc stretches and DF strengthening.  Gait training emphasizing overcorrection of valgus collapse by ambulating in pigeon toe configuration.   Self Care: Instructed in various gait strategies to minimize valgus collapse due to primarily to hip weakness resulting in internal rotation of hips creating a valgus collapse and a pronation moment at ankles which limited patient's ability to dorsiflex and clear feet during turning activities.  This patient was able to partially correct postural faults with overcompensation when instructed to walk with the pigeon toe pattern which promoted bilateral supination and minimized valgus collapse.  Patient did report a decrease in midfoot pain at end of session which can be attributed to restoration of foot mechanics.  Home exercise program updated to add gastroc stretching as well as dorsiflexion strengthening against the wall which allowed patient to adopt full knee extension and minimize postural deformities                                                                                                                       PATIENT EDUCATION:  Education details: Discussed eval findings, rehab rationale and POC and patient is in agreement  Person educated: Patient Education method: Explanation and Handouts Education comprehension: verbalized understanding and needs further education  HOME EXERCISE PROGRAM: Access Code: [email or web address] URL: [email or web address] Date: 08/21/2024 Prepared by: Helene Gasmen  Exercises - Figure 4 Bridge  - 1 x daily - 7 x weekly - 3 sets - 5 reps - Hooklying Single Leg Bent Knee Fallouts with Resistance  - 1-2 x daily - 5 x weekly - 3 sets - 6 reps - Staggered Sit-to-Stand  - 1 x daily - 5 x weekly - 3 sets -  5 reps - Standing  Hip Abduction with Counter Support  - 1 x daily - 7 x weekly - 3 sets - 6 reps - Standing Hip Extension with Counter Support  - 1 x daily - 7 x weekly - 3 sets - 6 reps - Standing March with Counter Support  - 1 x daily - 7 x weekly - 3 sets - 6 reps - Standing Hip Flexor Stretch  - 1 x daily - 7 x weekly - 31 sets - 3 reps - 45'' hold  ASSESSMENT:  CLINICAL IMPRESSION:  Upon goal recheck pt has made significant progress toward many of her short and long term goals. Her gait is improved with less sagittal plane movement, though I encouraged her to continue w/ [facility] for safety.  Her SLS balance is also improved although she does require intermittent toe touch to prevent LOB.  She has made some progress with functional hip abd strength but MMT is still quite weak and she is unable to hold testing position vs gravity.  We discussed progression starting with standing hip abd to build strength.  She plans to see ortho for LBP assessment.  We will D/C with HEP today with pt planning to continue exercises at home and starting back to gym with guidance form staff next year.   EVAL: Patient is a 83 y.o. female who was seen today for physical therapy evaluation and treatment for chronic low back pain and limited mobility due to underlying degenerative changes including stenosis.  Imaging studies show atrophy of lumbopelvic musculature.  BLE ROM and flexibility is functional and no neural tension signs noted.  Patient is a good candidate for OPPT to include aquatic PT, core strengthening and lumbar spine flexibility tasks.  OBJECTIVE IMPAIRMENTS: Abnormal gait, decreased activity tolerance, decreased coordination, decreased mobility, difficulty walking, decreased ROM, decreased strength, impaired perceived functional ability, improper body mechanics, postural dysfunction, and pain.   ACTIVITY LIMITATIONS: carrying, lifting, bending, standing, sleeping, transfers, and bed mobility  PERSONAL FACTORS: Age, Fitness,  Past/current experiences, and Time since onset of injury/illness/exacerbation are also affecting patient's functional outcome.   REHAB POTENTIAL: Good  CLINICAL DECISION MAKING: Evolving/moderate complexity  EVALUATION COMPLEXITY: Moderate   GOALS: Goals reviewed with patient? No  SHORT TERM GOALS: Target date: 05/24/2024  Patient to demonstrate independence in HEP  Baseline: F9MG [facility] Goal status: MET  2.  Assess 2 MWT  Baseline: TBD; 05/18/24 53/56 Goal status: MET  3.  Assess BERG for balance deficits Baseline: TBD; 05/18/24 53/56 Goal status: MET  LONG TERM GOALS: Target date: 06/21/2024 extended 08/24/2024   Patient will acknowledge 4/10 pain at least once during episode of care   Baseline: 5/10 10/30: can reach 4/10 Goal status: MET  2.  Patient will score at least 10/50 on ODI to signify clinically meaningful improvement in functional abilities.   Baseline: 16/50 10/30: 4/50 Goal status: MET  3.  Patient will increase 30s chair stand reps from 5 to 8 without arms to demonstrate and improved functional ability with less pain/difficulty as well as reduce fall risk.  Baseline: 5 10/30: 10 Goal status: MET  4.  Assess BERG goal as needed Baseline: TBD; no need Goal status: NA  5.  Assess 2 MWT for progress Baseline: 229ft  10/30:  308 Goal status: NA  6.  Tyliah will demonstrate significant improvement in R compensated trendelenburg gait pattern  Evaluation/Baseline: significant deviation 12/22: significant improvement but requires cane  Goal status: Partially MET  7.  Daquana will be  able to stand for >20'' in R SLS stance, to show a significant improvement in balance in order to reduce fall risk   Evaluation/Baseline: 4'' R, 15''+ L 12/22: R: requires several light toe taps Goal status: Partially MET  8.  Zissel will improve the following MMTs to >/= 4/5 to show improvement in strength:  R hip ER and abd   Evaluation/Baseline: see chart in note Goal  status: Partially MET   PLAN:  PT FREQUENCY: 1-2x/week  PT DURATION: 8 weeks  PLANNED INTERVENTIONS: 97110-Therapeutic exercises, 97530- Therapeutic activity, 97112- Neuromuscular re-education, 97535- Self Care, 02859- Manual therapy, [id number]- Gait training, [phone]- Aquatic Therapy, [phone] (1-2 muscles), [id number] (3+ muscles)- Dry Needling, Patient/Family education, Balance training, and Stair training.  PLAN FOR NEXT SESSION: HEP review and update, manual techniques as appropriate, aerobic tasks, ROM and flexibility activities, strengthening and PREs, TPDN, gait and balance training as needed     Helene FORBES Gasmen PT 08/21/2024 2:22 PM Phone: [phone] Fax: [phone]   "

## 2024-08-22 ENCOUNTER — Encounter: Payer: Self-pay | Admitting: Internal Medicine

## 2024-08-22 ENCOUNTER — Ambulatory Visit (INDEPENDENT_AMBULATORY_CARE_PROVIDER_SITE_OTHER): Admitting: Internal Medicine

## 2024-08-22 VITALS — BP 130/80 | HR 66 | Temp 97.7°F | Wt 181.9 lb

## 2024-08-22 DIAGNOSIS — Z0000 Encounter for general adult medical examination without abnormal findings: Secondary | ICD-10-CM | POA: Diagnosis not present

## 2024-08-22 DIAGNOSIS — E782 Mixed hyperlipidemia: Secondary | ICD-10-CM

## 2024-08-22 DIAGNOSIS — G90519 Complex regional pain syndrome I of unspecified upper limb: Secondary | ICD-10-CM

## 2024-08-22 DIAGNOSIS — G20A1 Parkinson's disease without dyskinesia, without mention of fluctuations: Secondary | ICD-10-CM

## 2024-08-22 DIAGNOSIS — Z780 Asymptomatic menopausal state: Secondary | ICD-10-CM

## 2024-08-22 DIAGNOSIS — E559 Vitamin D deficiency, unspecified: Secondary | ICD-10-CM | POA: Diagnosis not present

## 2024-08-22 DIAGNOSIS — I10 Essential (primary) hypertension: Secondary | ICD-10-CM | POA: Diagnosis not present

## 2024-08-22 DIAGNOSIS — E538 Deficiency of other specified B group vitamins: Secondary | ICD-10-CM

## 2024-08-22 LAB — CBC WITH DIFFERENTIAL/PLATELET
Basophils Absolute: 0 K/uL (ref 0.0–0.1)
Basophils Relative: 0.8 % (ref 0.0–3.0)
Eosinophils Absolute: 0.2 K/uL (ref 0.0–0.7)
Eosinophils Relative: 4.3 % (ref 0.0–5.0)
HCT: 38.7 % (ref 36.0–46.0)
Hemoglobin: 13 g/dL (ref 12.0–15.0)
Lymphocytes Relative: 28.3 % (ref 12.0–46.0)
Lymphs Abs: 1.5 K/uL (ref 0.7–4.0)
MCHC: 33.7 g/dL (ref 30.0–36.0)
MCV: 90.6 fl (ref 78.0–100.0)
Monocytes Absolute: 0.6 K/uL (ref 0.1–1.0)
Monocytes Relative: 11.1 % (ref 3.0–12.0)
Neutro Abs: 2.9 K/uL (ref 1.4–7.7)
Neutrophils Relative %: 55.5 % (ref 43.0–77.0)
Platelets: 237 K/uL (ref 150.0–400.0)
RBC: 4.27 Mil/uL (ref 3.87–5.11)
RDW: 14.7 % (ref 11.5–15.5)
WBC: 5.3 K/uL (ref 4.0–10.5)

## 2024-08-22 LAB — LIPID PANEL
Cholesterol: 139 mg/dL (ref 28–200)
HDL: 59.9 mg/dL
LDL Cholesterol: 66 mg/dL (ref 10–99)
NonHDL: 78.73
Total CHOL/HDL Ratio: 2
Triglycerides: 63 mg/dL (ref 10.0–149.0)
VLDL: 12.6 mg/dL (ref 0.0–40.0)

## 2024-08-22 LAB — VITAMIN B12: Vitamin B-12: 1011 pg/mL — ABNORMAL HIGH (ref 211–911)

## 2024-08-22 LAB — COMPREHENSIVE METABOLIC PANEL WITH GFR
ALT: 3 U/L (ref 3–35)
AST: 22 U/L (ref 5–37)
Albumin: 4 g/dL (ref 3.5–5.2)
Alkaline Phosphatase: 89 U/L (ref 39–117)
BUN: 29 mg/dL — ABNORMAL HIGH (ref 6–23)
CO2: 28 meq/L (ref 19–32)
Calcium: 8.6 mg/dL (ref 8.4–10.5)
Chloride: 106 meq/L (ref 96–112)
Creatinine, Ser: 0.69 mg/dL (ref 0.40–1.20)
GFR: 80.72 mL/min
Glucose, Bld: 92 mg/dL (ref 70–99)
Potassium: 4.3 meq/L (ref 3.5–5.1)
Sodium: 141 meq/L (ref 135–145)
Total Bilirubin: 0.6 mg/dL (ref 0.2–1.2)
Total Protein: 6.5 g/dL (ref 6.0–8.3)

## 2024-08-22 LAB — VITAMIN D 25 HYDROXY (VIT D DEFICIENCY, FRACTURES): VITD: 54.68 ng/mL (ref 30.00–100.00)

## 2024-08-22 LAB — TSH: TSH: 1.97 u[IU]/mL (ref 0.35–5.50)

## 2024-08-22 MED ORDER — PREGABALIN 150 MG PO CAPS: ORAL_CAPSULE | ORAL | 0 refills | Status: AC

## 2024-08-22 NOTE — Progress Notes (Signed)
 "    Established Patient Office Visit     CC/Reason for Visit: Annual preventive exam  HPI: Gwendolyn [name] is a 83 y.o. female who is coming in today for the above mentioned reasons. Past Medical History is significant for: Parkinson's disease, hypertension, hyperlipidemia, glaucoma, vitamin D  and B12 deficiencies, generalized anxiety disorder.  Feeling well without major concerns or complaints.  Has routine eye and dental care, is due for DEXA, all immunizations are up-to-date.   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    Vitamin D  deficiency     Past Surgical History:  Procedure Laterality Date   COLONOSCOPY     DILATION AND CURETTAGE OF UTERUS     age 71    right elbow   surgery  March 26, 2002   right hand ulner nerve surgery  March 26, 2002   I and d done right hand/wrist   right shoulder humerus fx  july 2003   surgery done   right total hip arthroplasty  2008   right total knee arthroplasty  sept 2012   right wrist plating  2004   TONSILLECTOMY  age 35   TOTAL HIP ARTHROPLASTY  10/22/2011   Procedure: TOTAL HIP ARTHROPLASTY ANTERIOR APPROACH;  Surgeon: Donnice JONETTA Car, MD;  Location: [facility];  Service: Orthopedics;  Laterality: Left;  Left Total Hip Arthroplasty,  Anterior Approach    TOTAL KNEE ARTHROPLASTY Left 04/28/2022   Procedure: TOTAL KNEE ARTHROPLASTY;  Surgeon: Car Donnice, MD;  Location: [facility];  Service: Orthopedics;  Laterality: Left;    Social History:  reports that she has never smoked. She has  never used smokeless tobacco. She reports current alcohol use. She reports that she does not use drugs.  Allergies: Allergies[1]  Family History:  Family History  Problem Relation Age of Onset   Hypertension Mother    Diabetes Mother    Heart disease Mother        enlarged heart   Atrial fibrillation Mother    Congestive Heart Failure Mother    Diabetes Father    Heart disease Father    Heart attack Father 62   Tremor Father    Atrial fibrillation Brother    Atrial fibrillation Brother    Breast cancer Maternal Grandmother        early 4's   Tremor Paternal Grandmother    Breast cancer Maternal Aunt        early 62's   Breast cancer Paternal Aunt        46's   Colon cancer Neg Hx    Esophageal cancer Neg Hx    Rectal cancer Neg Hx    Stomach cancer Neg Hx    Pancreatic cancer Neg Hx     Current Medications[2]  Review of Systems:  Negative unless indicated in HPI.   Physical Exam: Vitals:   08/22/24 0907  BP: 130/80  Pulse: 66  Temp: 97.7 F (36.5 C)  SpO2: 96%  Weight: 181 lb 14.4 oz (82.5 kg)    Body mass index is 30.27 kg/m.   Physical Exam Vitals reviewed.  Constitutional:      General: She is not in acute distress.    Appearance: Normal appearance. She is not ill-appearing, toxic-appearing or diaphoretic.  HENT:     Head: Normocephalic.     Right Ear: Tympanic membrane, ear canal and external ear normal. There is no impacted cerumen.     Left Ear: Tympanic membrane, ear canal and external ear normal. There is no impacted cerumen.     Nose: Nose normal.     Mouth/Throat:     Mouth: Mucous membranes are moist.     Pharynx: Oropharynx is clear. No oropharyngeal exudate or posterior oropharyngeal erythema.  Eyes:     General: No scleral icterus.       Right eye: No discharge.        Left eye: No discharge.     Conjunctiva/sclera: Conjunctivae normal.     Pupils: Pupils are equal, round, and reactive to light.  Neck:     Vascular: No carotid  bruit.  Cardiovascular:     Rate and Rhythm: Normal rate and regular rhythm.     Pulses: Normal pulses.     Heart sounds: Normal heart sounds.  Pulmonary:     Effort: Pulmonary effort is normal. No respiratory distress.     Breath sounds: Normal breath sounds.  Abdominal:     General: Abdomen is flat. Bowel sounds are normal.     Palpations: Abdomen is soft.  Musculoskeletal:        General: Normal range of motion.     Cervical back: Normal range of motion.  Skin:    General: Skin is warm and dry.  Neurological:     General: No focal deficit present.     Mental Status: She is alert and oriented to person, place, and time. Mental status is at baseline.  Psychiatric:        Mood and Affect: Mood normal.        Behavior: Behavior normal.        Thought Content: Thought content normal.        Judgment: Judgment normal.      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 -Healthy lifestyle discussed in detail. -Labs to be updated today. -Prostate cancer screening: N/A Health Maintenance  Topic Date Due   COVID-19 Vaccine (8 - 2025-26 season)  09/07/2024   Breast Cancer Screening  01/12/2025   Medicare Annual Wellness Visit  06/15/2025   DTaP/Tdap/Td vaccine (3 - Td or Tdap) 02/13/2032   Pneumococcal Vaccine for age over 38  Completed   Flu Shot  Completed   Osteoporosis screening with Bone Density Scan  Completed   Zoster (Shingles) Vaccine  Completed   Meningitis B Vaccine  Aged Out     -DEXA ordered.     Tully Theophilus Andrews, MD [facility]     [1]  Allergies Allergen Reactions   Oxycodone-Acetaminophen  Other (See Comments)    panic attack   Ibuprofen Hives   Macrodantin [Nitrofurantoin Macrocrystal] Rash  [2]  Current Outpatient Medications:    ALPRAZolam  (XANAX ) 0.5 MG tablet, Take 1 tablet (0.5 mg total) by mouth at bedtime as needed for anxiety., Disp: 30 tablet, Rfl: 0   ascorbic acid (VITAMIN C) 500 MG tablet, Take 500 mg by mouth daily., Disp: , Rfl:    carbidopa -levodopa  (SINEMET  IR) 25-100 MG tablet, TAKE 1 TABLET BY MOUTH 3 TIMES A DAY *TAKE AT 7:00AM, 11:00AM, AND 4:00PM*, Disp: 270 tablet, Rfl: 0   CHELATED MAGNESIUM PO, Take 250 mg by mouth 2 (two) times daily., Disp: , Rfl:    cholecalciferol  (VITAMIN D3) 25 MCG (1000 UNIT) tablet, Take 1,000 Units by mouth in the morning and at bedtime., Disp: , Rfl:    CRANBERRY PO, Take by mouth daily., Disp: , Rfl:    cyanocobalamin  (VITAMIN B12) 1000 MCG tablet, Take 1,000 mcg by mouth daily., Disp: , Rfl:    furosemide  (LASIX ) 40 MG tablet, Take 1 tablet (40 mg total) by mouth as needed for edema., Disp: 30 tablet, Rfl: 3   losartan -hydrochlorothiazide  (HYZAAR) 50-12.5 MG tablet, TAKE ONE TABLET BY MOUTH DAILY FOR BLOOD PRESSURE, Disp: 90 tablet, Rfl: 1   Naproxen Sodium (ALEVE PO), Take by mouth every other day., Disp: , Rfl:    rosuvastatin  (CRESTOR ) 20 MG tablet, Take 1 tablet MWF for Cholesterol, Disp: 36 tablet, Rfl: 1   timolol  (TIMOPTIC ) 0.5 % ophthalmic solution, Place 1 drop into both eyes every morning. , Disp: , Rfl:     tizanidine  (ZANAFLEX ) 2 MG capsule, Take 1 capsule (2 mg total) by mouth at bedtime as needed for muscle spasms (low back pain)., Disp: 30 capsule, Rfl: 1   pregabalin  (LYRICA ) 150 MG capsule, TAKE 1 CAPSULE BY MOUTH AT BEDTIME FOR SLEEP AND CHRONIC PAIN, Disp: 90 capsule, Rfl: 0  "

## 2024-09-05 ENCOUNTER — Ambulatory Visit: Payer: Self-pay | Admitting: Internal Medicine

## 2024-10-06 ENCOUNTER — Encounter: Payer: Self-pay | Admitting: Internal Medicine

## 2024-10-23 ENCOUNTER — Ambulatory Visit: Admitting: Neurology
